# Patient Record
Sex: Female | Born: 1955 | Race: Black or African American | Hispanic: No | Smoking: Former smoker
Health system: Southern US, Community
[De-identification: ages and names within clinical notes are randomized; demographics above are authoritative.]

## PROBLEM LIST (undated history)

## (undated) DIAGNOSIS — M539 Dorsopathy, unspecified: Secondary | ICD-10-CM

## (undated) DIAGNOSIS — I10 Essential (primary) hypertension: Secondary | ICD-10-CM

## (undated) DIAGNOSIS — M1990 Unspecified osteoarthritis, unspecified site: Secondary | ICD-10-CM

## (undated) DIAGNOSIS — Z8719 Personal history of other diseases of the digestive system: Secondary | ICD-10-CM

## (undated) DIAGNOSIS — S62109A Fracture of unspecified carpal bone, unspecified wrist, initial encounter for closed fracture: Secondary | ICD-10-CM

## (undated) DIAGNOSIS — M25569 Pain in unspecified knee: Secondary | ICD-10-CM

## (undated) DIAGNOSIS — M549 Dorsalgia, unspecified: Secondary | ICD-10-CM

## (undated) DIAGNOSIS — I499 Cardiac arrhythmia, unspecified: Secondary | ICD-10-CM

## (undated) DIAGNOSIS — G8929 Other chronic pain: Secondary | ICD-10-CM

## (undated) DIAGNOSIS — Z8659 Personal history of other mental and behavioral disorders: Secondary | ICD-10-CM

## (undated) DIAGNOSIS — K219 Gastro-esophageal reflux disease without esophagitis: Secondary | ICD-10-CM

## (undated) DIAGNOSIS — G4730 Sleep apnea, unspecified: Secondary | ICD-10-CM

## (undated) DIAGNOSIS — N760 Acute vaginitis: Secondary | ICD-10-CM

## (undated) DIAGNOSIS — E119 Type 2 diabetes mellitus without complications: Secondary | ICD-10-CM

## (undated) DIAGNOSIS — R7303 Prediabetes: Secondary | ICD-10-CM

## (undated) DIAGNOSIS — K5792 Diverticulitis of intestine, part unspecified, without perforation or abscess without bleeding: Secondary | ICD-10-CM

## (undated) DIAGNOSIS — R112 Nausea with vomiting, unspecified: Secondary | ICD-10-CM

## (undated) DIAGNOSIS — G629 Polyneuropathy, unspecified: Secondary | ICD-10-CM

## (undated) DIAGNOSIS — G4733 Obstructive sleep apnea (adult) (pediatric): Secondary | ICD-10-CM

## (undated) DIAGNOSIS — R001 Bradycardia, unspecified: Secondary | ICD-10-CM

## (undated) DIAGNOSIS — M545 Low back pain, unspecified: Secondary | ICD-10-CM

## (undated) DIAGNOSIS — Z9889 Other specified postprocedural states: Secondary | ICD-10-CM

## (undated) DIAGNOSIS — Z862 Personal history of diseases of the blood and blood-forming organs and certain disorders involving the immune mechanism: Secondary | ICD-10-CM

## (undated) HISTORY — PX: OTHER SURGICAL HISTORY: SHX169

## (undated) HISTORY — DX: Polyneuropathy, unspecified: G62.9

## (undated) HISTORY — DX: Unspecified osteoarthritis, unspecified site: M19.90

## (undated) HISTORY — DX: Personal history of diseases of the blood and blood-forming organs and certain disorders involving the immune mechanism: Z86.2

## (undated) HISTORY — DX: Personal history of other mental and behavioral disorders: Z86.59

## (undated) HISTORY — PX: ABDOMINAL HYSTERECTOMY: SHX81

## (undated) HISTORY — DX: Acute vaginitis: N76.0

## (undated) HISTORY — DX: Dorsopathy, unspecified: M53.9

## (undated) HISTORY — DX: Fracture of unspecified carpal bone, unspecified wrist, initial encounter for closed fracture: S62.109A

## (undated) HISTORY — DX: Obstructive sleep apnea (adult) (pediatric): G47.33

## (undated) HISTORY — DX: Bradycardia, unspecified: R00.1

## (undated) HISTORY — PX: BREAST REDUCTION SURGERY: SHX8

## (undated) HISTORY — DX: Personal history of other diseases of the digestive system: Z87.19

## (undated) HISTORY — DX: Type 2 diabetes mellitus without complications: E11.9

## (undated) HISTORY — PX: COLONOSCOPY: SHX174

## (undated) HISTORY — PX: TUBAL LIGATION: SHX77

## (undated) HISTORY — PX: UPPER GASTROINTESTINAL ENDOSCOPY: SHX188

## (undated) HISTORY — PX: KNEE SURGERY: SHX244

---

## 1999-03-22 ENCOUNTER — Other Ambulatory Visit: Admission: RE | Admit: 1999-03-22 | Discharge: 1999-03-22 | Payer: Self-pay | Admitting: Nurse Practitioner

## 2000-10-29 ENCOUNTER — Ambulatory Visit (HOSPITAL_BASED_OUTPATIENT_CLINIC_OR_DEPARTMENT_OTHER): Admission: RE | Admit: 2000-10-29 | Discharge: 2000-10-29 | Payer: Self-pay | Admitting: Critical Care Medicine

## 2000-12-04 ENCOUNTER — Other Ambulatory Visit: Admission: RE | Admit: 2000-12-04 | Discharge: 2000-12-04 | Payer: Self-pay | Admitting: *Deleted

## 2001-04-17 ENCOUNTER — Ambulatory Visit (HOSPITAL_COMMUNITY): Admission: RE | Admit: 2001-04-17 | Discharge: 2001-04-17 | Payer: Self-pay | Admitting: Orthopedic Surgery

## 2001-12-04 ENCOUNTER — Encounter: Payer: Self-pay | Admitting: Emergency Medicine

## 2001-12-04 ENCOUNTER — Inpatient Hospital Stay (HOSPITAL_COMMUNITY): Admission: EM | Admit: 2001-12-04 | Discharge: 2001-12-05 | Payer: Self-pay | Admitting: Emergency Medicine

## 2001-12-05 ENCOUNTER — Encounter: Payer: Self-pay | Admitting: Internal Medicine

## 2001-12-09 ENCOUNTER — Other Ambulatory Visit: Admission: RE | Admit: 2001-12-09 | Discharge: 2001-12-09 | Payer: Self-pay | Admitting: Obstetrics and Gynecology

## 2003-02-11 ENCOUNTER — Other Ambulatory Visit: Admission: RE | Admit: 2003-02-11 | Discharge: 2003-02-11 | Payer: Self-pay | Admitting: Obstetrics and Gynecology

## 2003-04-23 ENCOUNTER — Encounter: Payer: Self-pay | Admitting: Emergency Medicine

## 2003-04-23 ENCOUNTER — Emergency Department (HOSPITAL_COMMUNITY): Admission: EM | Admit: 2003-04-23 | Discharge: 2003-04-23 | Payer: Self-pay | Admitting: Emergency Medicine

## 2004-02-14 ENCOUNTER — Other Ambulatory Visit: Admission: RE | Admit: 2004-02-14 | Discharge: 2004-02-14 | Payer: Self-pay | Admitting: Obstetrics and Gynecology

## 2004-02-17 ENCOUNTER — Ambulatory Visit (HOSPITAL_COMMUNITY): Admission: RE | Admit: 2004-02-17 | Discharge: 2004-02-17 | Payer: Self-pay | Admitting: Orthopedic Surgery

## 2004-02-17 ENCOUNTER — Ambulatory Visit (HOSPITAL_BASED_OUTPATIENT_CLINIC_OR_DEPARTMENT_OTHER): Admission: RE | Admit: 2004-02-17 | Discharge: 2004-02-17 | Payer: Self-pay | Admitting: Orthopedic Surgery

## 2004-03-10 ENCOUNTER — Emergency Department (HOSPITAL_COMMUNITY): Admission: EM | Admit: 2004-03-10 | Discharge: 2004-03-10 | Payer: Self-pay | Admitting: Emergency Medicine

## 2004-11-06 ENCOUNTER — Ambulatory Visit: Payer: Self-pay | Admitting: Internal Medicine

## 2004-11-06 ENCOUNTER — Ambulatory Visit (HOSPITAL_COMMUNITY): Admission: RE | Admit: 2004-11-06 | Discharge: 2004-11-06 | Payer: Self-pay | Admitting: Internal Medicine

## 2004-11-09 ENCOUNTER — Ambulatory Visit (HOSPITAL_COMMUNITY): Admission: RE | Admit: 2004-11-09 | Discharge: 2004-11-09 | Payer: Self-pay | Admitting: Internal Medicine

## 2004-11-29 ENCOUNTER — Encounter: Admission: RE | Admit: 2004-11-29 | Discharge: 2005-01-04 | Payer: Self-pay | Admitting: Internal Medicine

## 2005-01-01 ENCOUNTER — Ambulatory Visit: Payer: Self-pay | Admitting: Internal Medicine

## 2005-01-02 ENCOUNTER — Ambulatory Visit: Payer: Self-pay | Admitting: Internal Medicine

## 2005-04-17 ENCOUNTER — Ambulatory Visit: Payer: Self-pay | Admitting: Internal Medicine

## 2005-07-17 ENCOUNTER — Ambulatory Visit: Payer: Self-pay | Admitting: Internal Medicine

## 2008-02-19 ENCOUNTER — Encounter: Payer: Self-pay | Admitting: Internal Medicine

## 2008-02-19 DIAGNOSIS — M545 Low back pain, unspecified: Secondary | ICD-10-CM | POA: Insufficient documentation

## 2008-02-19 DIAGNOSIS — I10 Essential (primary) hypertension: Secondary | ICD-10-CM | POA: Insufficient documentation

## 2008-02-19 DIAGNOSIS — M129 Arthropathy, unspecified: Secondary | ICD-10-CM | POA: Insufficient documentation

## 2008-11-24 ENCOUNTER — Emergency Department (HOSPITAL_COMMUNITY): Admission: EM | Admit: 2008-11-24 | Discharge: 2008-11-24 | Payer: Self-pay | Admitting: Family Medicine

## 2011-05-11 NOTE — Op Note (Signed)
[facility]  Patient:    Kristin Gibson, Kristin Gibson                 MRN: [id number] Proc. Date: 04/17/01 Adm. Date:  [id number] Attending:  Marlowe Kays Page                           Operative Report  PREOPERATIVE DIAGNOSIS:  Torn medial meniscus, right knee.  POSTOPERATIVE DIAGNOSES:  Torn medial and lateral menisci, right knee.  OPERATION PERFORMED:  Right knee arthroscopy with partial medial and lateral meniscectomies.  SURGEON:  Dr. Simonne Come.  ASSISTANT:  Nurse.  ANESTHESIA:  General.  PATHOLOGY AND JUSTIFICATION FOR PROCEDURE:  She first noted pain in her right knee around last August that has been progressive in nature and mainly medial. On exam, she has had medial joint line tenderness with pain increased by McMurray maneuver and with symptoms of internal derangement it was not felt an MRI was needed. At surgery, she had a significant tear of the entire posterior third of the medial meniscus as well as a tear of the posterior curve of the lateral meniscus and also anteriorly. The joint surfaces had minimal wear.  DESCRIPTION OF PROCEDURE:  Satisfactory general anesthesia, pneumatic tourniquet, thigh stabilizer, the right knee was prepped with Duraprep, draped in a sterile field, superior and medial saline inflow through it. First through an anterior lateral portal, the medial compartment of the knee joint was evaluated and there was minimal synovitis present. The extensive tear of the posterior meniscus was noted and trimmed back to a stable rim with baskets and then shaved down until smooth with a 3.5 shaver. I took final pictures after probing to be sure that there were no loose portions of meniscus. I then looked up in the medial gutter and suprapatellar area and no abnormalities were noted. There was minimal patella wear. Reversing portals, the pathology of the lateral meniscus was noted and I trimmed back the tear in the  posterior curve with baskets and then shaved down the inner rim anteriorly with a 3.5 shaver. She also had a little bit of wear of the lateral tibial plateau which I smoothed down as well. I then looked up in the lateral gutter and suprapatellar area and no additional findings were noted requiring surgical correction. The knee joint was then irrigated until clear and all fluid possible removed. The two anterior portals were closed with 4-0 nylon and 20 cc of of 0.5% Marcaine with adrenaline and 4 mg of morphine were then instilled through the inflap rasp which was removed and this portal closed with 4-0 nylon as well. Betadine Adaptic dry sterile dressing were applied, the tourniquet was released. The patient tolerated the procedure well and was taken to the recovery room in satisfactory condition with no known complications.DD:  04/17/01 TD:  04/18/01 Job: [id number]

## 2011-05-11 NOTE — Op Note (Signed)
NAME:  Kristin Gibson, Kristin Gibson                    ACCOUNT NO.:  [id number]   MEDICAL RECORD NO.:  [id number]                   PATIENT TYPE:  AMB   LOCATION:  DSC                                  FACILITY:  [facility]   PHYSICIAN:  Marlowe Kays, M.D.               DATE OF BIRTH:  1956/03/02   DATE OF PROCEDURE:  02/17/2004  DATE OF DISCHARGE:                                 OPERATIVE REPORT   PREOPERATIVE DIAGNOSIS:  Torn lateral, possible torn medial meniscus, right  knee.   POSTOPERATIVE DIAGNOSIS:  Torn lateral and medial menisci, right knee.   OPERATION PERFORMED:  Right knee arthroscopy with partial medial and lateral  meniscectomy.   SURGEON:  Marlowe Kays, M.D.   ASSISTANT:  Nurse.   ANESTHESIA:  General.   INDICATIONS FOR PROCEDURE:  The patient has a history of a prior right knee  arthroscopy and partial medial meniscectomy with history of having had a  partial medial meniscectomy with an arthroscopy many years ago.  She has had  a recurrence of her pain and I obtained an MRI of her right knee dated  December 23, 2003 which showed a severe degenerated lateral meniscus and  possible recurrent tears of her medial meniscus.  Accordingly, she is here  today for the above mentioned surgery.  At surgery, the MRI was accurate in  that she did have a severely macerated lateral meniscus at its mid and  posterior curve areas.  Also had tears of the medial meniscus intercondylar  and posterior curve areas as well.  Joint surfaces all looked good, did not  require shaving.  Patella surface looked good.  ACL was intact.   DESCRIPTION OF PROCEDURE:  Satisfactory general anesthesia MAC tourniquet.  Leg was esmarched out nonsterilely.  Thigh stabilizer.  Right leg prepped  with DuraPrep, draped as sterile field.  Superomedial saline inflow.  Posterior anterior medial portal lateral compartment knee joint was  evaluated.  After finding the torn lateral meniscus, I resected back to  stable rim with baskets and shaved it down until smooth with a 3.5 shaver  until remaining meniscus was stable and smoothly contoured.  Was unable to  get up into the suprapatellar area through this approach.  I then exchanged  portals and through the anterolateral portal looked at medial compartment of  knee joint with findings of torn meniscus as noted above.  The meniscus was  trimmed back once again with small baskets and shaved down until smooth with  a 3.5 shaver.  I then looked up in the medial gutter and patellar surface  had some minimal wear which did not require shaving.  The knee joint was  then irrigated until clear and all fluid possible removed.  The __________  portals closed with 4-0 nylon.  Then, 20 mL 0.5% Marcaine with adrenalin 4  mg of morphine were then instilled through the inflow apparatus which was  removed __________  as well.  Betadine,  Adaptic, dry sterile dressing  applied.  Tourniquet was released.  Tolerated the procedure well and was  taken to recovery in satisfactory condition with no complications.                                               Marlowe Kays, M.D.    JA/MEDQ  D:  02/17/2004  T:  02/18/2004  Job:  [id number]

## 2011-05-11 NOTE — Discharge Summary (Signed)
[place]  Patient:    Kristin Gibson, Kristin Gibson Visit Number: [id number] MRN: [id number]          Service Type: MED Location: [id number] Attending Physician:  Carrie Mew Dictated by:   Rosalyn Gess. Norins, M.D. [facility] Admit Date:  12/04/2001 Disc. Date: 12/05/01   CC:         Sonda Primes, M.D. [facility]   Discharge Summary  ADMITTING DIAGNOSIS:  Chest pain, rule out myocardial infarction.  DISCHARGE DIAGNOSIS:  Myocardial infarction, ruled out.  CONSULTATIONS:  None.  PROCEDURE:  Stress Cardiolite which during exercise did show the patient to have a significant hypertensive response with a blood pressure maxing out at 220/116 with a diastolic increase in recovery.  During exercise, the patient reported chest discomfort at one minute with central chest pressure and radiation to her left neck.  She had no diaphoresis and no nausea.  The pain was rated 5/10 and continued into recovery with pain in her left shoulder and thoracic area increasing to an 8/10.  It was relieved with two sublingual nitroglycerin.  Electrocardiogram, marred by artifact, with question of ST downsloping V5-V6 at peak exercise resolved in recovery.  Images were read out by radiology as negative for any evidence of ischemia.  The ejection fraction was 64%.  HISTORY OF PRESENT ILLNESS:  The patient is a 55 year old, African-American female with no known major risk factors who has presented to the ER with left sternal chest pain that started at work and rated at an 8/10 with some radiation to her jaw.  The patient reported she did have some shortness of breath without diaphoresis or nausea.  Because of her symptoms, the patient was admitted to rule out for MI.  PAST MEDICAL HISTORY: 1. Tubal ligation. 2. Total abdominal hysterectomy. 3. Arthroscopic surgery of her right knee. 4. Breast reduction surgery. 5. No significant medical illnesses.  MEDICATION:  Maxzide  for "swelling."  ALLERGIES:  No known drug allergies.  PHYSICAL EXAMINATION: (Per Dr. Lovell Sheehan)  VITAL SIGNS:  Blood pressure 132/82, pulse 76.  CHEST:  Clear.  HEART:  Regular rate and rhythm without murmurs, rubs or gallops.  Trace peripheral edema.  She had chest wall tenderness at the costochondral junction of 7, 8 and 9 bilaterally.  ASSESSMENT/PLAN:  The patient was admitted to the telemetry unit where she remained in sinus rhythm with no abnormalities.  LABORATORY DATA AND X-RAY FINDINGS:  The patient had initial CK of 334 with MB fraction of 1.6.  Second CK 292 with CK-MB of 1.1, troponin less than 0.01 on both occasions.  HOSPITAL COURSE:  The patient was given Protonix with some significant improvement in her discomfort.  The patient having had negative cardiac enzymes with negative troponins with positive CK, but negative MB fractions suggestive of a musculoskeletal elevation, not cardiac elevation and with a negative Cardiolite, the patient is felt to be stable for discharge.  With the patient having had a hypertensive response to exercise, despite having a normal resting blood pressure, would recommend that she be started on antihypertensive medication.  Will defer this decision to Dr. Posey Rea who will see the patient in followup.  DISCHARGE PHYSICAL EXAMINATION:  VITAL SIGNS:  Temperature 98.3, blood pressure 114/80, pulse 62, O2 saturations on room air 98%.  GENERAL:  This is an obese, African-American female in no acute distress.  CHEST:  Clear.  CARDIOVASCULAR:  Regular rate and rhythm without murmurs, rubs or gallops.  ABDOMEN:  Obese, soft, with  positive bowel sounds with no significant tenderness to deep palpation.  DISCHARGE LABORATORY DATA AND X-RAY FINDINGS:  TSH of 1.992.  Hemoglobin A1C was 5.6.  Chest x-ray revealed no active disease.  DISPOSITION:  The patient is discharged home.  FOLLOWUP:  She is instructed to call and make an  appointment to see Dr. Posey Rea in followup in 7-10 days.  He will discuss with her appropriate antihypertensive medication for her exercise related hypertension.  SPECIAL INSTRUCTIONS:  The patient is instructed that if she has recurrent chest pain or chest discomfort that is unrelieved, that she should call for further evaluation.  DISCHARGE MEDICATIONS: 1. Protonix 40 mg q.d. 2. Celebrex 200 mg q.d. for probably costochondritis.  CONDITION ON DISCHARGE:  Stable and improved. Dictated by:   Rosalyn Gess. Norins, M.D. [facility] Attending Physician:  Carrie Mew DD:  12/05/01 TD:  12/06/01 Job: [id number]

## 2011-08-21 ENCOUNTER — Inpatient Hospital Stay (INDEPENDENT_AMBULATORY_CARE_PROVIDER_SITE_OTHER)
Admission: RE | Admit: 2011-08-21 | Discharge: 2011-08-21 | Disposition: A | Discharge status: Home / Self Care | Payer: Self-pay | Source: Ambulatory Visit | Attending: Emergency Medicine | Admitting: Emergency Medicine

## 2011-08-21 DIAGNOSIS — I10 Essential (primary) hypertension: Secondary | ICD-10-CM

## 2011-08-21 LAB — POCT URINALYSIS DIP (DEVICE)
Glucose, UA: NEGATIVE mg/dL
Ketones, ur: NEGATIVE mg/dL
Leukocytes, UA: NEGATIVE
Protein, ur: NEGATIVE mg/dL
Specific Gravity, Urine: 1.01 (ref 1.005–1.030)
Urobilinogen, UA: 0.2 mg/dL (ref 0.0–1.0)

## 2011-08-22 LAB — URINE CULTURE

## 2011-09-27 LAB — POCT URINALYSIS DIP (DEVICE)
Bilirubin Urine: NEGATIVE
Hgb urine dipstick: NEGATIVE
Ketones, ur: NEGATIVE mg/dL
Protein, ur: 30 mg/dL — AB
pH: 6.5 (ref 5.0–8.0)

## 2011-09-27 LAB — WET PREP, GENITAL
Trich, Wet Prep: NONE SEEN
WBC, Wet Prep HPF POC: NONE SEEN
Yeast Wet Prep HPF POC: NONE SEEN

## 2012-05-21 ENCOUNTER — Encounter (HOSPITAL_COMMUNITY): Payer: Self-pay

## 2012-05-21 ENCOUNTER — Emergency Department (HOSPITAL_COMMUNITY)
Admission: EM | Admit: 2012-05-21 | Discharge: 2012-05-21 | Disposition: A | Discharge status: Home / Self Care | Payer: Self-pay | Attending: Emergency Medicine | Admitting: Emergency Medicine

## 2012-05-21 DIAGNOSIS — Z79899 Other long term (current) drug therapy: Secondary | ICD-10-CM | POA: Insufficient documentation

## 2012-05-21 DIAGNOSIS — R002 Palpitations: Secondary | ICD-10-CM | POA: Insufficient documentation

## 2012-05-21 DIAGNOSIS — I10 Essential (primary) hypertension: Secondary | ICD-10-CM | POA: Insufficient documentation

## 2012-05-21 DIAGNOSIS — R51 Headache: Secondary | ICD-10-CM | POA: Insufficient documentation

## 2012-05-21 HISTORY — DX: Essential (primary) hypertension: I10

## 2012-05-21 LAB — DIFFERENTIAL
Basophils Absolute: 0 10*3/uL (ref 0.0–0.1)
Eosinophils Absolute: 0.1 10*3/uL (ref 0.0–0.7)
Eosinophils Relative: 1 % (ref 0–5)
Lymphocytes Relative: 38 % (ref 12–46)
Neutrophils Relative %: 54 % (ref 43–77)

## 2012-05-21 LAB — CBC
MCV: 89.6 fL (ref 78.0–100.0)
Platelets: 222 10*3/uL (ref 150–400)
RDW: 14.5 % (ref 11.5–15.5)
WBC: 5.1 10*3/uL (ref 4.0–10.5)

## 2012-05-21 LAB — URINALYSIS, ROUTINE W REFLEX MICROSCOPIC
Ketones, ur: NEGATIVE mg/dL
Leukocytes, UA: NEGATIVE
Nitrite: NEGATIVE
Protein, ur: NEGATIVE mg/dL
Urobilinogen, UA: 0.2 mg/dL (ref 0.0–1.0)

## 2012-05-21 LAB — BASIC METABOLIC PANEL
CO2: 30 mEq/L (ref 19–32)
Calcium: 9.5 mg/dL (ref 8.4–10.5)
Creatinine, Ser: 0.79 mg/dL (ref 0.50–1.10)
GFR calc Af Amer: >90 mL/min (ref >90)
GFR calc non Af Amer: >90 mL/min (ref >90)

## 2012-05-21 LAB — TROPONIN I: Troponin I: <0.3 ng/mL (ref <0.30)

## 2012-05-21 MED ORDER — ASPIRIN 81 MG PO CHEW
324.0000 mg | CHEWABLE_TABLET | Freq: Once | ORAL | Status: AC
  Administered 2012-05-21: 324 mg via ORAL
  Filled 2012-05-21: qty 3

## 2012-05-21 MED ORDER — ACETAMINOPHEN 325 MG PO TABS
650.0000 mg | ORAL_TABLET | Freq: Once | ORAL | Status: AC
  Administered 2012-05-21: 650 mg via ORAL
  Filled 2012-05-21: qty 2

## 2012-05-21 NOTE — ED Notes (Addendum)
Urine not resulted. Lab called. Lab informs that urine has not been received. Urine was collected and sent at 1121. Pascal Lux Wingen PA notified and pt informed of need to recollect urine.

## 2012-05-21 NOTE — ED Notes (Signed)
Patient reports that she has a history of hypertension and patient has been doubling her HTN meds without physician knowing. Today, patient presents with headache, hypertension and c/o lower abd pain. Patient also c/o heart racing x 3 weeks.

## 2012-05-21 NOTE — ED Provider Notes (Signed)
History     CSN: [id number]  Arrival date & time 05/21/12  1002   First MD Initiated Contact with Patient 05/21/12 1042      Chief Complaint  Patient presents with  . Headache  . Abdominal Pain    (Consider location/radiation/quality/duration/timing/severity/associated sxs/prior treatment) HPI Comments: Patient comes in today with a chief complaint of headache and feelings that her heart is racing.  She reports that the headache is similar to headaches that she has had in the past when her blood pressure becomes elevated. She reports that over the past 3 weeks her blood pressure has been elevated and she has been doubling up on her blood pressure medication.  She reports that at home her blood pressure has been running 190's/110.  She last took her blood pressure medication this morning.  She denies any visual changes, focal weakness, difficulty speaking, difficulty swallowing, vomiting, or dizziness.  She has not taken anything for her headache.  She also reports that she is having some numbness over the lower abdomen.  She denies abdominal pain.  No nausea, vomiting, or diarrhea.  She has not had any fevers.  She also reports that she has been feeling palpitations for the past 3 weeks.  The history is provided by the patient.    Past Medical History  Diagnosis Date  . Hypertension     Past Surgical History  Procedure Date  . Tubal ligation   . Abdominal hysterectomy   . Knee surgery   . Breast reduction surgery     Family History  Problem Relation Age of Onset  . Hypertension Mother   . Hypertension Sister     History  Substance Use Topics  . Smoking status: Former Games developer  . Smokeless tobacco: Never Used  . Alcohol Use: Yes     occasionally    OB History    Grav Para Term Preterm Abortions TAB SAB Ect Mult Living                  Review of Systems  Constitutional: Negative for fever, chills and diaphoresis.  HENT: Negative for neck pain and neck stiffness.     Eyes: Negative for pain, redness and visual disturbance.  Respiratory: Negative for shortness of breath.   Cardiovascular: Positive for palpitations. Negative for leg swelling.  Gastrointestinal: Negative for nausea, vomiting, abdominal pain, diarrhea, constipation and abdominal distention.  Genitourinary: Negative for dysuria.  Musculoskeletal: Negative for gait problem.  Skin: Negative for rash.  Neurological: Positive for headaches. Negative for dizziness, syncope, weakness and light-headedness.  Psychiatric/Behavioral: Negative for confusion.    Allergies  Review of patient's allergies indicates no known allergies.  Home Medications   Current Outpatient Rx  Name Route Sig Dispense Refill  . ACETAMINOPHEN 500 MG PO TABS Oral Take 500 mg by mouth every 6 (six) hours as needed. pain    . BENAZEPRIL HCL 40 MG PO TABS Oral Take 80 mg by mouth daily.    Marland Kitchen BIOTIN 1000 MCG PO TABS Oral Take 1,000 mcg by mouth daily.    . CHLORPHENIRAMINE-DM 4-30 MG PO TABS Oral Take 1 tablet by mouth daily.    Marland Kitchen VITAMIN D 1000 UNITS PO TABS Oral Take 1,000 Units by mouth daily.    Marland Kitchen ULTIMATE PROBIOTIC FORMULA PO CAPS Oral Take 1 capsule by mouth daily.    Marland Kitchen MAGNESIUM OXIDE 400 MG PO TABS Oral Take 400 mg by mouth daily.    . MULTI-VITAMIN/MINERALS PO TABS Oral Take 1 tablet  by mouth daily.    Marland Kitchen FISH OIL TRIPLE STRENGTH 1400 MG PO CAPS Oral Take 1,400 mg by mouth daily.    Marland Kitchen POTASSIUM GLUCONATE PO Oral Take 99 mg by mouth daily.    . TRAMADOL-ACETAMINOPHEN 37.5-325 MG PO TABS Oral Take 1 tablet by mouth every 6 (six) hours as needed. pain    . TRIAMTERENE-HCTZ 37.5-25 MG PO TABS Oral Take 1 tablet by mouth daily.      BP 177/102  Pulse 62  Temp(Src) 99.1 F (37.3 C) (Oral)  Wt 266 lb 5.1 oz (120.8 kg)  SpO2 100%  Physical Exam  Nursing note and vitals reviewed. Constitutional: She appears well-developed and well-nourished.  HENT:  Head: Normocephalic and atraumatic.  Mouth/Throat: Oropharynx  is clear and moist.  Eyes: EOM are normal. Pupils are equal, round, and reactive to light.  Neck: Normal range of motion. Neck supple.  Cardiovascular: Normal rate, regular rhythm and normal heart sounds.   Pulmonary/Chest: Effort normal and breath sounds normal.  Abdominal: Soft. Bowel sounds are normal. She exhibits no distension and no mass. There is no tenderness. There is no rebound and no guarding.  Neurological: She is alert. She has normal strength. No cranial nerve deficit or sensory deficit. Coordination and gait normal.  Skin: Skin is warm and dry.  Psychiatric: She has a normal mood and affect.    ED Course  Procedures (including critical care time)   Labs Reviewed  URINALYSIS, ROUTINE W REFLEX MICROSCOPIC  BASIC METABOLIC PANEL  CBC  DIFFERENTIAL  TROPONIN I   No results found.   No diagnosis found.  Patient discussed with Dr. Ranae Palms.  11:58 AM Reassessed patient.  She reports that her headache has improved at this time.   Date: 05/21/2012  Rate: 80  Rhythm: normal sinus rhythm  QRS Axis: normal  Intervals: normal  ST/T Wave abnormalities: nonspecific T wave changes  Conduction Disutrbances:none  Narrative Interpretation: PVC's   Old EKG Reviewed: changes noted, PVC's present on EKG today that were not noted on last EKG  2:26 PM Reassessed patient.  She reports that her headache has completely resolved at this time.  MDM  Patient presenting with headache, which she reports is typical when her blood pressure increases.  BP fluctuating while in ED.  No chest pain.  No decreased urination.  Labs unremarkable.  UA negative.  No focal neurological deficits, afebrile, no vision changes, no vomiting.  Headache resolved completely while in ED.  Therefore, feel that patient could be discharged home.  Patient explained the importance of following up with PCP regarding elevated blood pressure.          Pascal Lux [place], PA-C 05/21/12 2317

## 2012-05-21 NOTE — Discharge Instructions (Signed)
Take your blood pressure medication as prescribed.  Return to the Emergency Department if symptoms change or worsen.

## 2012-05-23 NOTE — ED Provider Notes (Signed)
Medical screening examination/treatment/procedure(s) were performed by non-physician practitioner and as supervising physician I was immediately available for consultation/collaboration.   Miloh Alcocer, MD 05/23/12 1949 

## 2016-11-05 DIAGNOSIS — R600 Localized edema: Secondary | ICD-10-CM | POA: Insufficient documentation

## 2016-11-05 DIAGNOSIS — G8929 Other chronic pain: Secondary | ICD-10-CM | POA: Insufficient documentation

## 2016-11-05 DIAGNOSIS — M25561 Pain in right knee: Secondary | ICD-10-CM

## 2016-11-05 DIAGNOSIS — M25562 Pain in left knee: Secondary | ICD-10-CM

## 2016-11-20 DIAGNOSIS — M180 Bilateral primary osteoarthritis of first carpometacarpal joints: Secondary | ICD-10-CM | POA: Insufficient documentation

## 2016-11-20 DIAGNOSIS — M1712 Unilateral primary osteoarthritis, left knee: Secondary | ICD-10-CM | POA: Insufficient documentation

## 2016-11-20 DIAGNOSIS — M1711 Unilateral primary osteoarthritis, right knee: Secondary | ICD-10-CM | POA: Insufficient documentation

## 2016-12-04 DIAGNOSIS — G4733 Obstructive sleep apnea (adult) (pediatric): Secondary | ICD-10-CM | POA: Insufficient documentation

## 2017-01-15 DIAGNOSIS — Z6841 Body Mass Index (BMI) 40.0 and over, adult: Secondary | ICD-10-CM

## 2017-01-15 DIAGNOSIS — N3281 Overactive bladder: Secondary | ICD-10-CM | POA: Insufficient documentation

## 2017-01-15 DIAGNOSIS — K219 Gastro-esophageal reflux disease without esophagitis: Secondary | ICD-10-CM | POA: Insufficient documentation

## 2017-02-24 ENCOUNTER — Encounter (HOSPITAL_COMMUNITY): Payer: Self-pay

## 2017-02-24 ENCOUNTER — Emergency Department (HOSPITAL_COMMUNITY)
Admission: EM | Admit: 2017-02-24 | Discharge: 2017-02-24 | Disposition: A | Discharge status: Home / Self Care | Payer: Medicare Other | Attending: Emergency Medicine | Admitting: Emergency Medicine

## 2017-02-24 ENCOUNTER — Emergency Department (HOSPITAL_COMMUNITY): Payer: Medicare Other

## 2017-02-24 DIAGNOSIS — Z79899 Other long term (current) drug therapy: Secondary | ICD-10-CM | POA: Diagnosis not present

## 2017-02-24 DIAGNOSIS — B9789 Other viral agents as the cause of diseases classified elsewhere: Secondary | ICD-10-CM

## 2017-02-24 DIAGNOSIS — Z87891 Personal history of nicotine dependence: Secondary | ICD-10-CM | POA: Insufficient documentation

## 2017-02-24 DIAGNOSIS — R05 Cough: Secondary | ICD-10-CM | POA: Diagnosis present

## 2017-02-24 DIAGNOSIS — J069 Acute upper respiratory infection, unspecified: Secondary | ICD-10-CM | POA: Diagnosis not present

## 2017-02-24 DIAGNOSIS — I10 Essential (primary) hypertension: Secondary | ICD-10-CM | POA: Insufficient documentation

## 2017-02-24 HISTORY — DX: Diverticulitis of intestine, part unspecified, without perforation or abscess without bleeding: K57.92

## 2017-02-24 MED ORDER — KETOROLAC TROMETHAMINE 30 MG/ML IJ SOLN
30.0000 mg | Freq: Once | INTRAMUSCULAR | Status: AC
  Administered 2017-02-24: 30 mg via INTRAVENOUS
  Filled 2017-02-24: qty 1

## 2017-02-24 MED ORDER — GUAIFENESIN ER 1200 MG PO TB12: 1.0000 | ORAL_TABLET | Freq: Two times a day (BID) | ORAL | 0 refills | Status: DC

## 2017-02-24 MED ORDER — ALBUTEROL SULFATE HFA 108 (90 BASE) MCG/ACT IN AERS
2.0000 | INHALATION_SPRAY | RESPIRATORY_TRACT | Status: DC | PRN
  Administered 2017-02-24: 2 via RESPIRATORY_TRACT
  Filled 2017-02-24: qty 6.7

## 2017-02-24 MED ORDER — PREDNISONE 50 MG PO TABS: 50.0000 mg | ORAL_TABLET | Freq: Every day | ORAL | 0 refills | Status: DC

## 2017-02-24 MED ORDER — AEROCHAMBER PLUS FLO-VU MEDIUM MISC
1.0000 | Freq: Once | Status: AC
  Administered 2017-02-24: 1
  Filled 2017-02-24: qty 1

## 2017-02-24 MED ORDER — IPRATROPIUM-ALBUTEROL 0.5-2.5 (3) MG/3ML IN SOLN
3.0000 mL | Freq: Once | RESPIRATORY_TRACT | Status: AC
  Administered 2017-02-24: 3 mL via RESPIRATORY_TRACT
  Filled 2017-02-24: qty 3

## 2017-02-24 MED ORDER — SODIUM CHLORIDE 0.9 % IV BOLUS (SEPSIS)
1000.0000 mL | Freq: Once | INTRAVENOUS | Status: AC
  Administered 2017-02-24: 1000 mL via INTRAVENOUS

## 2017-02-24 MED ORDER — PROMETHAZINE-DM 6.25-15 MG/5ML PO SYRP: 5.0000 mL | ORAL_SOLUTION | Freq: Four times a day (QID) | ORAL | 0 refills | Status: DC | PRN

## 2017-02-24 NOTE — ED Provider Notes (Signed)
[facility]-EMERGENCY DEPT Provider Note   CSN: [id number] Arrival date & time: 02/24/17  1025     History   Chief Complaint Chief Complaint  Patient presents with  . Cough  . Headache    HPI Kristin Gibson is a 61 y.o. female.  HPI Patient presents to the emergency department with a 2 day history of cough, nasal congestion, sore throat.  Patient states that she woke up this morning with worsening cough.  Patient states that her cough is dry.  She is like she has been wheezing.  Patient did not take any medications prior to arrival for her symptoms. The patient denies chest pain, shortness of breath, headache,blurred vision, neck pain, fever, weakness, numbness, dizziness, anorexia, edema, abdominal pain, nausea, vomiting, diarrhea, rash, back pain, dysuria, hematemesis, bloody stool, near syncope, or syncope. Past Medical History:  Diagnosis Date  . Diverticulitis   . Hypertension     Patient Active Problem List   Diagnosis Date Noted  . HYPERTENSION 02/19/2008  . ARTHRITIS 02/19/2008  . LOW BACK PAIN 02/19/2008    Past Surgical History:  Procedure Laterality Date  . ABDOMINAL HYSTERECTOMY    . BREAST REDUCTION SURGERY    . KNEE SURGERY    . TUBAL LIGATION      OB History    No data available       Home Medications    Prior to Admission medications   Medication Sig Start Date End Date Taking? Authorizing Provider  acetaminophen (TYLENOL) 500 MG tablet Take 500 mg by mouth every 6 (six) hours as needed. pain    Historical Provider, MD  benazepril (LOTENSIN) 40 MG tablet Take 80 mg by mouth daily.    Historical Provider, MD  Biotin 1000 MCG tablet Take 1,000 mcg by mouth daily.    Historical Provider, MD  Chlorpheniramine-DM (CORICIDIN COUGH/COLD) 4-30 MG TABS Take 1 tablet by mouth daily.    Historical Provider, MD  cholecalciferol (VITAMIN D) 1000 UNITS tablet Take 1,000 Units by mouth daily.    Historical Provider, MD  Lactobacillus (ULTIMATE PROBIOTIC  FORMULA) CAPS Take 1 capsule by mouth daily.    Historical Provider, MD  magnesium oxide (MAG-OX) 400 MG tablet Take 400 mg by mouth daily.    Historical Provider, MD  Multiple Vitamins-Minerals (MULTIVITAMIN WITH MINERALS) tablet Take 1 tablet by mouth daily.    Historical Provider, MD  Omega-3 Fatty Acids (FISH OIL TRIPLE STRENGTH) 1400 MG CAPS Take 1,400 mg by mouth daily.    Historical Provider, MD  POTASSIUM GLUCONATE PO Take 99 mg by mouth daily.    Historical Provider, MD  traMADol-acetaminophen (ULTRACET) 37.5-325 MG per tablet Take 1 tablet by mouth every 6 (six) hours as needed. pain    Historical Provider, MD  triamterene-hydrochlorothiazide (MAXZIDE-25) 37.5-25 MG per tablet Take 1 tablet by mouth daily.    Historical Provider, MD    Family History Family History  Problem Relation Age of Onset  . Hypertension Mother   . Hypertension Sister     Social History Social History  Substance Use Topics  . Smoking status: Former Games developer  . Smokeless tobacco: Never Used  . Alcohol use Yes     Comment: occasionally     Allergies   Sulfa antibiotics   Review of Systems Review of Systems  All other systems negative except as documented in the HPI. All pertinent positives and negatives as reviewed in the HPI. Physical Exam Updated Vital Signs BP 151/81 (BP Location: Right Arm)   Pulse  66   Temp 98.3 F (36.8 C) (Oral)   Resp 18   SpO2 98%   Physical Exam  Constitutional: She is oriented to person, place, and time. She appears well-developed and well-nourished. No distress.  HENT:  Head: Normocephalic and atraumatic.  Mouth/Throat: Oropharynx is clear and moist.  Eyes: Pupils are equal, round, and reactive to light.  Neck: Normal range of motion. Neck supple.  Cardiovascular: Normal rate, regular rhythm and normal heart sounds.  Exam reveals no gallop and no friction rub.   No murmur heard. Pulmonary/Chest: Effort normal and breath sounds normal. No respiratory  distress. She has no wheezes.  Abdominal: Soft. Bowel sounds are normal. She exhibits no distension. There is no tenderness.  Neurological: She is alert and oriented to person, place, and time. She exhibits normal muscle tone. Coordination normal.  Skin: Skin is warm and dry. Capillary refill takes less than 2 seconds. No rash noted. No erythema.  Psychiatric: She has a normal mood and affect. Her behavior is normal.  Nursing note and vitals reviewed.    ED Treatments / Results  Labs (all labs ordered are listed, but only abnormal results are displayed) Labs Reviewed - No data to display  EKG  EKG Interpretation None       Radiology Dg Chest 2 View  Result Date: 02/24/2017 CLINICAL DATA:  Dry cough for 3 days. EXAM: CHEST  2 VIEW COMPARISON:  None. FINDINGS: Mild cardiomegaly. The hila and mediastinum are normal. No pulmonary nodules, masses, or focal infiltrates. IMPRESSION: No active cardiopulmonary disease. Electronically Signed   By: Gerome Samavid  Williams III M.D   On: 02/24/2017 11:17    Procedures Procedures (including critical care time)  Medications Ordered in ED Medications  sodium chloride 0.9 % bolus 1,000 mL (1,000 mLs Intravenous New Bag/Given 02/24/17 1235)  ipratropium-albuterol (DUONEB) 0.5-2.5 (3) MG/3ML nebulizer solution 3 mL (3 mLs Nebulization Given 02/24/17 1226)  ketorolac (TORADOL) 30 MG/ML injection 30 mg (30 mg Intravenous Given 02/24/17 1235)     Initial Impression / Assessment and Plan / ED Course  I have reviewed the triage vital signs and the nursing notes.  Pertinent labs & imaging results that were available during my care of the patient were reviewed by me and considered in my medical decision making (see chart for details).     Patient be treated for a viral URI with cough.  Told to return here as needed.  Patient agrees the plan and all questions were answered  Final Clinical Impressions(s) / ED Diagnoses   Final diagnoses:  None    New  Prescriptions New Prescriptions   No medications on file     Charlestine NightChristopher Issabela Lesko, PA-C 02/24/17 1357    Lyndal Pulleyaniel Knott, MD 02/24/17 1941

## 2017-02-24 NOTE — ED Triage Notes (Signed)
Pt states she has a cough with rattling in her chest x 2 days.  Pt denies fever.  Also wants bp checked.  States it was 160/130 at home. WNL in triage.  Also has headache which she attributes to bp.  Took her bp meds this morning.

## 2017-02-24 NOTE — Discharge Instructions (Signed)
Return here as needed.  Follow-up with your primary doctor, increase her fluid intake and rest as much as possible °

## 2017-02-25 DIAGNOSIS — R1030 Lower abdominal pain, unspecified: Secondary | ICD-10-CM | POA: Insufficient documentation

## 2017-02-25 DIAGNOSIS — K5900 Constipation, unspecified: Secondary | ICD-10-CM | POA: Insufficient documentation

## 2017-02-25 DIAGNOSIS — K5732 Diverticulitis of large intestine without perforation or abscess without bleeding: Secondary | ICD-10-CM | POA: Insufficient documentation

## 2017-04-19 ENCOUNTER — Emergency Department (HOSPITAL_COMMUNITY)
Admission: EM | Admit: 2017-04-19 | Discharge: 2017-04-20 | Disposition: A | Discharge status: Home / Self Care | Payer: Medicare Other | Attending: Emergency Medicine | Admitting: Emergency Medicine

## 2017-04-19 ENCOUNTER — Emergency Department (HOSPITAL_COMMUNITY): Payer: Medicare Other

## 2017-04-19 ENCOUNTER — Encounter (HOSPITAL_COMMUNITY): Payer: Self-pay | Admitting: Emergency Medicine

## 2017-04-19 DIAGNOSIS — W01198A Fall on same level from slipping, tripping and stumbling with subsequent striking against other object, initial encounter: Secondary | ICD-10-CM | POA: Insufficient documentation

## 2017-04-19 DIAGNOSIS — Y999 Unspecified external cause status: Secondary | ICD-10-CM | POA: Diagnosis not present

## 2017-04-19 DIAGNOSIS — S60221A Contusion of right hand, initial encounter: Secondary | ICD-10-CM

## 2017-04-19 DIAGNOSIS — S6991XA Unspecified injury of right wrist, hand and finger(s), initial encounter: Secondary | ICD-10-CM | POA: Diagnosis present

## 2017-04-19 DIAGNOSIS — Y92000 Kitchen of unspecified non-institutional (private) residence as  the place of occurrence of the external cause: Secondary | ICD-10-CM | POA: Diagnosis not present

## 2017-04-19 DIAGNOSIS — Z87891 Personal history of nicotine dependence: Secondary | ICD-10-CM | POA: Insufficient documentation

## 2017-04-19 DIAGNOSIS — S80211A Abrasion, right knee, initial encounter: Secondary | ICD-10-CM | POA: Diagnosis not present

## 2017-04-19 DIAGNOSIS — Z79899 Other long term (current) drug therapy: Secondary | ICD-10-CM | POA: Insufficient documentation

## 2017-04-19 DIAGNOSIS — Y9301 Activity, walking, marching and hiking: Secondary | ICD-10-CM | POA: Diagnosis not present

## 2017-04-19 DIAGNOSIS — I10 Essential (primary) hypertension: Secondary | ICD-10-CM | POA: Diagnosis not present

## 2017-04-19 HISTORY — DX: Unspecified osteoarthritis, unspecified site: M19.90

## 2017-04-19 MED ORDER — TRAMADOL HCL 50 MG PO TABS
50.0000 mg | ORAL_TABLET | Freq: Once | ORAL | Status: AC
  Administered 2017-04-20: 50 mg via ORAL
  Filled 2017-04-19: qty 1

## 2017-04-19 NOTE — ED Provider Notes (Signed)
[facility]-EMERGENCY DEPT Provider Note   CSN: [id number] Arrival date & time: 04/19/17  2222     History   Chief Complaint Chief Complaint  Patient presents with  . Fall    HPI Kristin Gibson is a 61 y.o. female with a hx of BL OA of knees. Patient states that she tripped over a dog toy and landed directly on her Knee. She also smacked her hand on the stove on the sway down. She c/o pain in the R knee and difficulty ambulating. The r hand is also sore. She did not hit her head or loose consciousness. She takes celebrex and tramadol for pain.  HPI  Past Medical History:  Diagnosis Date  . Arthritis   . Diverticulitis   . Hypertension     Patient Active Problem List   Diagnosis Date Noted  . HYPERTENSION 02/19/2008  . ARTHRITIS 02/19/2008  . LOW BACK PAIN 02/19/2008    Past Surgical History:  Procedure Laterality Date  . ABDOMINAL HYSTERECTOMY    . BREAST REDUCTION SURGERY    . KNEE SURGERY    . TUBAL LIGATION      OB History    No data available       Home Medications    Prior to Admission medications   Medication Sig Start Date End Date Taking? Authorizing Provider  acetaminophen (TYLENOL) 500 MG tablet Take 500 mg by mouth every 6 (six) hours as needed. pain    Historical Provider, MD  benazepril (LOTENSIN) 40 MG tablet Take 80 mg by mouth daily.    Historical Provider, MD  Biotin 1000 MCG tablet Take 1,000 mcg by mouth daily.    Historical Provider, MD  Chlorpheniramine-DM (CORICIDIN COUGH/COLD) 4-30 MG TABS Take 1 tablet by mouth daily.    Historical Provider, MD  cholecalciferol (VITAMIN D) 1000 UNITS tablet Take 1,000 Units by mouth daily.    Historical Provider, MD  Guaifenesin 1200 MG TB12 Take 1 tablet (1,200 mg total) by mouth 2 (two) times daily. 02/24/17   Charlestine Night, PA-C  Lactobacillus (ULTIMATE PROBIOTIC FORMULA) CAPS Take 1 capsule by mouth daily.    Historical Provider, MD  magnesium oxide (MAG-OX) 400 MG tablet Take 400 mg by mouth  daily.    Historical Provider, MD  Multiple Vitamins-Minerals (MULTIVITAMIN WITH MINERALS) tablet Take 1 tablet by mouth daily.    Historical Provider, MD  Omega-3 Fatty Acids (FISH OIL TRIPLE STRENGTH) 1400 MG CAPS Take 1,400 mg by mouth daily.    Historical Provider, MD  POTASSIUM GLUCONATE PO Take 99 mg by mouth daily.    Historical Provider, MD  predniSONE (DELTASONE) 50 MG tablet Take 1 tablet (50 mg total) by mouth daily. 02/24/17   Charlestine Night, PA-C  promethazine-dextromethorphan (PROMETHAZINE-DM) 6.25-15 MG/5ML syrup Take 5 mLs by mouth 4 (four) times daily as needed for cough. 02/24/17   Charlestine Night, PA-C  traMADol-acetaminophen (ULTRACET) 37.5-325 MG per tablet Take 1 tablet by mouth every 6 (six) hours as needed. pain    Historical Provider, MD  triamterene-hydrochlorothiazide (MAXZIDE-25) 37.5-25 MG per tablet Take 1 tablet by mouth daily.    Historical Provider, MD    Family History Family History  Problem Relation Age of Onset  . Hypertension Mother   . Cancer Sister     Social History Social History  Substance Use Topics  . Smoking status: Former Games developer  . Smokeless tobacco: Never Used  . Alcohol use Yes     Comment: occasionally     Allergies  Sulfa antibiotics   Review of Systems Review of Systems  Musculoskeletal: Positive for arthralgias and joint swelling.  Skin: Positive for wound (abrasion to knee).     Physical Exam Updated Vital Signs BP (!) 138/92 (BP Location: Left Arm)   Pulse 74   Temp 98.1 F (36.7 C) (Oral)   Resp 20   Ht  (1.778 m)   Wt 126.6 kg   SpO2 99%   BMI 40.03 kg/m   Physical Exam  Constitutional: She is oriented to person, place, and time. She appears well-developed and well-nourished. No distress.  HENT:  Head: Normocephalic and atraumatic.  Eyes: Conjunctivae are normal. No scleral icterus.  Neck: Normal range of motion.  Cardiovascular: Normal rate, regular rhythm and normal heart sounds.  Exam  reveals no gallop and no friction rub.   No murmur heard. Pulmonary/Chest: Effort normal and breath sounds normal. No respiratory distress.  Abdominal: Soft. Bowel sounds are normal. She exhibits no distension and no mass. There is no tenderness. There is no guarding.  Musculoskeletal:  R hand without evidence of trauma. FROM and strength. NVI, Normal ipsilateral wrist and elbow. R knee with signs of Chronic OA. 2 cm abrasion over the R patella. Full PROM. Pain with active ROM  Neurological: She is alert and oriented to person, place, and time.  Skin: Skin is warm and dry. She is not diaphoretic.  Psychiatric: Her behavior is normal.  Nursing note and vitals reviewed.    ED Treatments / Results  Labs (all labs ordered are listed, but only abnormal results are displayed) Labs Reviewed - No data to display  EKG  EKG Interpretation None       Radiology Dg Knee Complete 4 Views Right  Result Date: 04/19/2017 CLINICAL DATA:  Right knee pain after slip and fall. Abrasion noted to the right knee. EXAM: RIGHT KNEE - COMPLETE 4+ VIEW COMPARISON:  None. FINDINGS: Lateral femorotibial and patellofemoral joint space narrowing with spurring is noted. No acute fracture nor dislocation is identified. No joint effusion. IMPRESSION: Osteoarthritis of the knee without acute osseous appearing abnormality. Electronically Signed   By: Tollie Eth M.D.   On: 04/19/2017 23:45   Dg Hand Complete Right  Result Date: 04/19/2017 CLINICAL DATA:  Tripped and fell. Pain over the right thumb and first metacarpal. EXAM: RIGHT HAND - COMPLETE 3+ VIEW COMPARISON:  None. FINDINGS: There is no evidence of fracture or dislocation. Circumscribed lytic lucency at the base of the distal phalanx of the right thumb may represent a small enchondroma or cyst. Degenerative joint space narrowing is seen at the base of the thumb metacarpal. Soft tissues are unremarkable. IMPRESSION: No acute fracture or malalignment. Possible  small enchondroma or cyst of the first distal phalanx. Electronically Signed   By: Tollie Eth M.D.   On: 04/19/2017 23:48    Procedures Procedures (including critical care time)  Medications Ordered in ED Medications  traMADol (ULTRAM) tablet 50 mg (not administered)     Initial Impression / Assessment and Plan / ED Course  I have reviewed the triage vital signs and the nursing notes.  Pertinent labs & imaging results that were available during my care of the patient were reviewed by me and considered in my medical decision making (see chart for details).     Patient X-Ray negative for obvious fracture or dislocation. Pain managed in ED. Pt advised to follow up with orthopedics if symptoms persist for possibility of missed fracture diagnosis. Conservative therapy recommended and discussed.  Patient will be dc home & is agreeable with above plan.   Final Clinical Impressions(s) / ED Diagnoses   Final diagnoses:  Abrasion of right knee, initial encounter  Contusion of right hand, initial encounter    New Prescriptions New Prescriptions   No medications on file     Arthor Captain, PA-C 04/22/17 5784    Rolan Bucco, MD 04/29/17 1125

## 2017-04-19 NOTE — ED Triage Notes (Signed)
Pt states she was walking in the kitchen and slipped on her dog's toy causing her to fall  Pt is c/o right hand pain and right knee pain  Pt has abrasion noted to right knee  Pt denies hitting her head or LOC

## 2017-04-19 NOTE — Discharge Instructions (Signed)
YOur xrays were negative.  Please continue to take your celebrex and tramadol. Ice the affected knee and hand. Follow up with your primary care doctor.  Return to the emergency department if you are unable to walk or for any new or worsening symptoms.

## 2017-04-19 NOTE — ED Notes (Signed)
Pt to xray

## 2017-07-29 DIAGNOSIS — F325 Major depressive disorder, single episode, in full remission: Secondary | ICD-10-CM | POA: Insufficient documentation

## 2017-07-29 DIAGNOSIS — E876 Hypokalemia: Secondary | ICD-10-CM | POA: Insufficient documentation

## 2017-08-01 ENCOUNTER — Ambulatory Visit: Payer: Medicare Other

## 2017-08-01 ENCOUNTER — Encounter: Payer: Medicare Other | Admitting: Podiatry

## 2017-08-01 ENCOUNTER — Other Ambulatory Visit: Payer: Self-pay

## 2017-08-01 DIAGNOSIS — M2141 Flat foot [pes planus] (acquired), right foot: Secondary | ICD-10-CM

## 2017-08-01 DIAGNOSIS — M2142 Flat foot [pes planus] (acquired), left foot: Principal | ICD-10-CM

## 2017-08-05 NOTE — Progress Notes (Signed)
This encounter was created in error - please disregard.

## 2017-08-20 ENCOUNTER — Ambulatory Visit: Payer: Self-pay | Admitting: Surgery

## 2017-09-03 ENCOUNTER — Ambulatory Visit: Payer: Medicare Other | Admitting: Podiatry

## 2017-09-26 ENCOUNTER — Ambulatory Visit (INDEPENDENT_AMBULATORY_CARE_PROVIDER_SITE_OTHER): Payer: Medicare Other

## 2017-09-26 ENCOUNTER — Ambulatory Visit (INDEPENDENT_AMBULATORY_CARE_PROVIDER_SITE_OTHER): Payer: Medicare Other | Admitting: Podiatry

## 2017-09-26 ENCOUNTER — Encounter: Payer: Self-pay | Admitting: Podiatry

## 2017-09-26 VITALS — BP 113/68 | HR 54 | Resp 16

## 2017-09-26 DIAGNOSIS — M2142 Flat foot [pes planus] (acquired), left foot: Secondary | ICD-10-CM | POA: Diagnosis not present

## 2017-09-26 DIAGNOSIS — K649 Unspecified hemorrhoids: Secondary | ICD-10-CM | POA: Insufficient documentation

## 2017-09-26 DIAGNOSIS — M2141 Flat foot [pes planus] (acquired), right foot: Secondary | ICD-10-CM | POA: Diagnosis not present

## 2017-09-26 DIAGNOSIS — K635 Polyp of colon: Secondary | ICD-10-CM | POA: Insufficient documentation

## 2017-09-26 NOTE — Progress Notes (Signed)
Subjective:  Patient ID: Kristin Gibson, female    DOB: [birth year],  MRN: [id number] HPI Chief Complaint  Patient presents with  . Foot Pain    Patient concerned with flat feet, notice they turn inward more, walking a lot increases pain, makes her legs her some days    61 y.o. female presents with the above complaint.     Past Medical History:  Diagnosis Date  . Arthritis   . Diverticulitis   . Hypertension    Past Surgical History:  Procedure Laterality Date  . ABDOMINAL HYSTERECTOMY    . BREAST REDUCTION SURGERY    . KNEE SURGERY    . TUBAL LIGATION      Current Outpatient Prescriptions:  .  amLODipine (NORVASC) 10 MG tablet, Take by mouth., Disp: , Rfl:  .  buPROPion (WELLBUTRIN XL) 150 MG 24 hr tablet, TAKE ONE TABLET BY MOUTH EVERY MORNING. NEED APPOINTMENT FOR ADDITIONAL REFILLS, Disp: , Rfl:  .  celecoxib (CELEBREX) 200 MG capsule, TAKE 1 CAPSULE BY MOUTH TWICE A DAY, Disp: , Rfl:  .  cloNIDine (CATAPRES) 0.1 MG tablet, Take by mouth., Disp: , Rfl:  .  diclofenac sodium (VOLTAREN) 1 % GEL, Apply topically., Disp: , Rfl:  .  dicyclomine (BENTYL) 20 MG tablet, TAKE TABLET BY MOUTH THREE TIMES A DAY AS NEEDED FOR PAIN, Disp: , Rfl:  .  hydrochlorothiazide (MICROZIDE) 12.5 MG capsule, Take by mouth., Disp: , Rfl:  .  potassium chloride SA (K-DUR,KLOR-CON) 20 MEQ tablet, Take by mouth., Disp: , Rfl:  .  acetaminophen (TYLENOL) 500 MG tablet, Take 500 mg by mouth every 6 (six) hours as needed. pain, Disp: , Rfl:  .  benazepril (LOTENSIN) 40 MG tablet, Take 80 mg by mouth daily., Disp: , Rfl:  .  cholecalciferol (VITAMIN D) 1000 UNITS tablet, Take 1,000 Units by mouth daily., Disp: , Rfl:  .  Lactobacillus (ULTIMATE PROBIOTIC FORMULA) CAPS, Take 1 capsule by mouth daily., Disp: , Rfl:  .  Multiple Vitamins-Minerals (MULTIVITAMIN WITH MINERALS) tablet, Take 1 tablet by mouth daily., Disp: , Rfl:  .  pantoprazole (PROTONIX) 40 MG tablet, Take by mouth., Disp: , Rfl:    Allergies  Allergen Reactions  . Sulfa Antibiotics   . Sulfur Nausea Only   Review of Systems  All other systems reviewed and are negative.  Objective:   Vitals:   09/26/17 1346  BP: 113/68  Pulse: (!) 54  Resp: 16    General: Well developed, nourished, in no acute distress, alert and oriented x3   Dermatological: Skin is warm, dry and supple bilateral. Nails x 10 are well maintained; remaining integument appears unremarkable at this time. There are no open sores, no preulcerative lesions, no rash or signs of infection present.  Vascular: Dorsalis Pedis artery and Posterior Tibial artery pedal pulses are 2/4 bilateral with immedate capillary fill time. Pedal hair growth present. No varicosities and no lower extremity edema present bilateral.   Neruologic: Grossly intact via light touch bilateral. Vibratory intact via tuning fork bilateral. Protective threshold with Semmes Wienstein monofilament intact to all pedal sites bilateral. Patellar and Achilles deep tendon reflexes 2+ bilateral. No Babinski or clonus noted bilateral.   Musculoskeletal: No gross boney pedal deformities bilateral. No pain, crepitus, or limitation noted with foot and ankle range of motion bilateral. Muscular strength 5/5 in all groups tested bilateral.Although his distal ankle full range of motion without crepitation. She does have significant calcaneal valgus bilaterally right greater than left with very little  ability to invert her subtalar joint utilizing a posterior tibial tendon. She does demonstrate a severe flatfoot deformity which is flexible in nature. Right greater than left.  Gait: Unassisted, Nonantalgic.    Radiographs:  3 views of the bilateral foot demonstrates a osseously mature individual with moderate to severe pes planus no coalitions identifiable today. No fractures or identifiable.  Assessment & Plan:   Assessment: Severe pes planus bilaterally right greater than left. 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  Plan: We discussed surgical intervention today and the possibility of triple arthrodesis gastroc recession Kristin Gibson. She understands this and is amenable to it if necessary however we would like to try orthotics first. She is not interested in a full [place] brace. She will follow up with Kristin Gibson for orthotics.     Kristin Gibson Kristin Gibson, [place]

## 2017-10-08 ENCOUNTER — Ambulatory Visit (INDEPENDENT_AMBULATORY_CARE_PROVIDER_SITE_OTHER): Payer: Medicare Other | Admitting: Orthotics

## 2017-10-08 DIAGNOSIS — M2142 Flat foot [pes planus] (acquired), left foot: Secondary | ICD-10-CM

## 2017-10-08 DIAGNOSIS — M2141 Flat foot [pes planus] (acquired), right foot: Secondary | ICD-10-CM

## 2017-10-08 NOTE — Progress Notes (Signed)
Patient presents today with pes planus feet and possible PTTD; complains of heel pain and also some posterior aspect of calcaneous.  Will fab Richy lab [place] Inverted. 4* RF varus skive and medial lateral flange deep heel cup.

## 2017-10-18 ENCOUNTER — Telehealth: Payer: Self-pay | Admitting: Podiatry

## 2017-10-18 NOTE — Telephone Encounter (Signed)
Pt left voicemail regarding her orthotics that Rick had casted pt for. She said we did not get prior authorization for them and the insurance company had not received anything from us stating the need for them.   I returned call and pt states she was not told that insurance would not cover and she did sign the paper with Raiford Nobleick  for it but said she does not want them because she cannot afford 398.00..  I did explain that we can do as cash pay and it would be 300.00 and she could do half down and set up payments and she is thinking about it and will let us know.

## 2017-10-31 ENCOUNTER — Encounter: Payer: Medicare Other | Admitting: Orthotics

## 2017-10-31 ENCOUNTER — Ambulatory Visit: Payer: Medicare Other | Admitting: Podiatry

## 2018-02-19 ENCOUNTER — Other Ambulatory Visit (HOSPITAL_COMMUNITY): Payer: Self-pay | Admitting: Surgery

## 2018-02-27 ENCOUNTER — Ambulatory Visit (HOSPITAL_COMMUNITY)
Admission: RE | Admit: 2018-02-27 | Discharge: 2018-02-27 | Disposition: A | Discharge status: Home / Self Care | Payer: Medicare HMO | Source: Ambulatory Visit | Attending: Surgery | Admitting: Surgery

## 2018-02-27 DIAGNOSIS — Z6841 Body Mass Index (BMI) 40.0 and over, adult: Secondary | ICD-10-CM | POA: Diagnosis not present

## 2018-03-03 ENCOUNTER — Ambulatory Visit: Payer: Medicare Other | Admitting: Skilled Nursing Facility1

## 2018-03-18 ENCOUNTER — Encounter: Payer: Medicare HMO | Attending: Surgery | Admitting: Registered"

## 2018-03-18 ENCOUNTER — Encounter: Payer: Self-pay | Admitting: Registered"

## 2018-03-18 DIAGNOSIS — Z8249 Family history of ischemic heart disease and other diseases of the circulatory system: Secondary | ICD-10-CM | POA: Diagnosis not present

## 2018-03-18 DIAGNOSIS — G8929 Other chronic pain: Secondary | ICD-10-CM | POA: Insufficient documentation

## 2018-03-18 DIAGNOSIS — K219 Gastro-esophageal reflux disease without esophagitis: Secondary | ICD-10-CM | POA: Diagnosis not present

## 2018-03-18 DIAGNOSIS — M1710 Unilateral primary osteoarthritis, unspecified knee: Secondary | ICD-10-CM | POA: Diagnosis not present

## 2018-03-18 DIAGNOSIS — Z87891 Personal history of nicotine dependence: Secondary | ICD-10-CM | POA: Diagnosis not present

## 2018-03-18 DIAGNOSIS — Z9851 Tubal ligation status: Secondary | ICD-10-CM | POA: Insufficient documentation

## 2018-03-18 DIAGNOSIS — Z833 Family history of diabetes mellitus: Secondary | ICD-10-CM | POA: Insufficient documentation

## 2018-03-18 DIAGNOSIS — Z8261 Family history of arthritis: Secondary | ICD-10-CM | POA: Insufficient documentation

## 2018-03-18 DIAGNOSIS — M25562 Pain in left knee: Secondary | ICD-10-CM | POA: Diagnosis not present

## 2018-03-18 DIAGNOSIS — Z888 Allergy status to other drugs, medicaments and biological substances status: Secondary | ICD-10-CM | POA: Insufficient documentation

## 2018-03-18 DIAGNOSIS — Z79899 Other long term (current) drug therapy: Secondary | ICD-10-CM | POA: Insufficient documentation

## 2018-03-18 DIAGNOSIS — M25561 Pain in right knee: Secondary | ICD-10-CM | POA: Diagnosis not present

## 2018-03-18 DIAGNOSIS — Z882 Allergy status to sulfonamides status: Secondary | ICD-10-CM | POA: Insufficient documentation

## 2018-03-18 DIAGNOSIS — I10 Essential (primary) hypertension: Secondary | ICD-10-CM | POA: Insufficient documentation

## 2018-03-18 DIAGNOSIS — E669 Obesity, unspecified: Secondary | ICD-10-CM

## 2018-03-18 DIAGNOSIS — Z809 Family history of malignant neoplasm, unspecified: Secondary | ICD-10-CM | POA: Insufficient documentation

## 2018-03-18 DIAGNOSIS — Z9889 Other specified postprocedural states: Secondary | ICD-10-CM | POA: Diagnosis not present

## 2018-03-18 DIAGNOSIS — M549 Dorsalgia, unspecified: Secondary | ICD-10-CM | POA: Diagnosis not present

## 2018-03-18 DIAGNOSIS — Z713 Dietary counseling and surveillance: Secondary | ICD-10-CM | POA: Diagnosis present

## 2018-03-18 DIAGNOSIS — Z90710 Acquired absence of both cervix and uterus: Secondary | ICD-10-CM | POA: Insufficient documentation

## 2018-03-18 DIAGNOSIS — G4733 Obstructive sleep apnea (adult) (pediatric): Secondary | ICD-10-CM | POA: Diagnosis not present

## 2018-03-18 DIAGNOSIS — Z811 Family history of alcohol abuse and dependence: Secondary | ICD-10-CM | POA: Diagnosis not present

## 2018-03-18 DIAGNOSIS — Z836 Family history of other diseases of the respiratory system: Secondary | ICD-10-CM | POA: Diagnosis not present

## 2018-03-18 DIAGNOSIS — M1990 Unspecified osteoarthritis, unspecified site: Secondary | ICD-10-CM | POA: Diagnosis not present

## 2018-03-18 DIAGNOSIS — F329 Major depressive disorder, single episode, unspecified: Secondary | ICD-10-CM | POA: Insufficient documentation

## 2018-03-18 NOTE — Progress Notes (Signed)
Pre-Op Assessment Visit: Pre-Operative Sleeve Gastrectomy Surgery  Medical Nutrition Therapy:  Appt start time: 3:07  End time: 4:19  Patient was seen on 03/18/2018 for Pre-Operative Nutrition Assessment. Assessment and letter of approval faxed to [facility] Bariatric Surgery Program coordinator on 03/18/2018.   Pt expectation of surgery: initially lose 40 lbs, reduce portion sizes, to have knee replacement surgery, reduce blood pressure medications, able to use stairs easier, increase mobility  Pt expectation of Dietitian: dietary guidelines   Start weight at [facility]: 298.0 BMI: 44.65  Pt states she is currently intermittent fasting along with granddaughter. Pt states she lives with daughter and granddaughter and when she buys snacks they normally eat them all and she is not left with good options. Pt states if she gets hungry at night she will drink water and go to bed. Pt states she is a little lactose intolerant.   Per insurance, pt needs 6 SWL visits prior to surgery. Pt states she is unsure where she will do her SWL visits; made next appt with [place].   24 hr Dietary Recall: First Meal: fried pork chop, egg Snack: pork skins or sometimes dry cereal Second Meal: bowl of mac and cheese Snack: sometimes fruit, nuts Third Meal: spare ribs, corn on cob Snack: none Beverages: water, kool aid, coffee (daily in colder months), herbal tea, Emergen-C  Encouraged to engage in 75 minutes of moderate physical activity including cardiovascular and weight baring weekly  Handouts given during visit include:  . Pre-Op Goals . Bariatric Surgery Protein Shakes . Vitamin and Mineral Recommendations  During the appointment today the following Pre-Op Goals were reviewed with the patient: . Track your food and beverage: MyFitness Pal or Baritastic App . Make healthy food choices . Begin to limit portion sizes . Limited concentrated sugars and fried foods . Keep fat/sugar in the single  digits per serving on         food labels . Practice CHEWING your food  (aim for 30 chews per bite or until applesauce consistency) . Practice not drinking 15 minutes before, during, and 30 minutes after each meal/snack . Avoid all carbonated beverages  . Avoid/limit caffeinated beverages  . Avoid all sugar-sweetened beverages . Avoid alcohol . Consume 3 meals per day; eat every 3-5 hours . Make a list of non-food related activities . Aim for 64-100 ounces of FLUID daily  . Aim for at least 60-80 grams of PROTEIN daily . Look for a liquid protein source that contain ?15 g protein and ?5 g carbohydrate  (ex: shakes, drinks, shots) . Physical activity is an important part of a healthy lifestyle so keep it moving!  Follow diet recommendations listed below Energy and Macronutrient Recommendations: Calories: 1600 Carbohydrate: 180 Protein: 120 Fat: 44  Demonstrated degree of understanding via:  Teach Back   Teaching Method Utilized:  Visual Auditory Hands on  Barriers to learning/adherence to lifestyle change: contemplative stage of change, limited ability to have daily well-balanced meals  Patient to call the Nutrition and Diabetes Education Services to enroll in Pre-Op and Post-Op Nutrition Education when surgery date is scheduled.

## 2018-04-14 ENCOUNTER — Ambulatory Visit: Payer: Medicare HMO | Admitting: Registered"

## 2018-04-29 ENCOUNTER — Ambulatory Visit: Payer: Medicare HMO | Admitting: Registered"

## 2018-05-22 ENCOUNTER — Encounter (INDEPENDENT_AMBULATORY_CARE_PROVIDER_SITE_OTHER): Payer: Medicare Other

## 2018-05-29 ENCOUNTER — Ambulatory Visit (INDEPENDENT_AMBULATORY_CARE_PROVIDER_SITE_OTHER): Payer: Medicare Other | Admitting: Family Medicine

## 2018-10-14 ENCOUNTER — Other Ambulatory Visit: Payer: Self-pay

## 2018-10-14 ENCOUNTER — Encounter (HOSPITAL_COMMUNITY): Payer: Self-pay | Admitting: Emergency Medicine

## 2018-10-14 ENCOUNTER — Emergency Department (HOSPITAL_COMMUNITY)
Admission: EM | Admit: 2018-10-14 | Discharge: 2018-10-14 | Disposition: A | Discharge status: Home / Self Care | Payer: Medicare HMO | Attending: Emergency Medicine | Admitting: Emergency Medicine

## 2018-10-14 DIAGNOSIS — F17200 Nicotine dependence, unspecified, uncomplicated: Secondary | ICD-10-CM | POA: Diagnosis not present

## 2018-10-14 DIAGNOSIS — G8929 Other chronic pain: Secondary | ICD-10-CM

## 2018-10-14 DIAGNOSIS — I10 Essential (primary) hypertension: Secondary | ICD-10-CM | POA: Insufficient documentation

## 2018-10-14 DIAGNOSIS — M25562 Pain in left knee: Secondary | ICD-10-CM | POA: Insufficient documentation

## 2018-10-14 DIAGNOSIS — Z79899 Other long term (current) drug therapy: Secondary | ICD-10-CM | POA: Insufficient documentation

## 2018-10-14 HISTORY — DX: Dorsalgia, unspecified: M54.9

## 2018-10-14 HISTORY — DX: Other chronic pain: G89.29

## 2018-10-14 HISTORY — DX: Other chronic pain: M25.569

## 2018-10-14 MED ORDER — OXYCODONE-ACETAMINOPHEN 5-325 MG PO TABS
1.0000 | ORAL_TABLET | Freq: Once | ORAL | Status: DC
  Filled 2018-10-14: qty 1

## 2018-10-14 MED ORDER — TRAMADOL HCL 50 MG PO TABS
50.0000 mg | ORAL_TABLET | Freq: Once | ORAL | Status: AC
  Administered 2018-10-14: 50 mg via ORAL
  Filled 2018-10-14: qty 1

## 2018-10-14 MED ORDER — LIDOCAINE 5 % EX PTCH: 1.0000 | MEDICATED_PATCH | CUTANEOUS | 0 refills | Status: DC

## 2018-10-14 MED ORDER — TRAMADOL HCL 50 MG PO TABS: 50.0000 mg | ORAL_TABLET | Freq: Two times a day (BID) | ORAL | 0 refills | Status: DC | PRN

## 2018-10-14 NOTE — ED Notes (Signed)
Patient verbalized understanding of discharge instructions, no questions. Patient out of ED with crutches in no distress

## 2018-10-14 NOTE — Discharge Instructions (Addendum)
Prescription given for Tramadol. Take medication as directed and do not operate machinery, drive a car, or work while taking this medication as it can make you drowsy.   You were also given a prescription for lidocaine patches.  As an alternative to filling the prescription for lidocaine patches you may purchase over-the-counter Salon [place]. Use as directed.  Continue using warm or cool compresses to help with your symptoms.  You were given crutches as well to use for comfort.  Please call your orthopedic doctor to make an appointment for follow-up and return to the emergency department for any new or worsening symptoms.

## 2018-10-14 NOTE — ED Provider Notes (Signed)
[facility]-EMERGENCY DEPT Provider Note   CSN: [id number] Arrival date & time: 10/14/18  1310     History   Chief Complaint Chief Complaint  Patient presents with  . Knee Pain    left    HPI Kristin Gibson is a 62 y.o. female.  HPI  Patient is a 62 year old female with a history of bilateral knee arthritis and chronic knee pain who presents emergency department today complaining of an acute exacerbation of left knee pain.  Patient states she has been receiving injections to her bilateral knees recently.  She most recently received an injection to both knees last week.  Since then she is continued to have pain and swelling at the left knee.  No redness, warmth, fevers or chills to the knee.  Pain is worse with movement and ambulation.  She has tried Tylenol, Motrin, Mobic and heating pad with no relief.  States she also has 2 knee braces at home which have not provided significant relief.  Past Medical History:  Diagnosis Date  . Arthritis   . Back pain   . Diverticulitis   . Hypertension   . Knee pain, chronic     Patient Active Problem List   Diagnosis Date Noted  . Colon polyp 09/26/2017  . Hemorrhoids 09/26/2017  . Hypokalemia 07/29/2017  . Major depressive disorder with single episode, in full remission (HCC) 07/29/2017  . Constipation 02/25/2017  . Diverticulitis of large intestine 02/25/2017  . Lower abdominal pain 02/25/2017  . GERD (gastroesophageal reflux disease) 01/15/2017  . Morbid obesity with BMI of 40.0-44.9, adult (HCC) 01/15/2017  . OAB (overactive bladder) 01/15/2017  . OSA (obstructive sleep apnea) 12/04/2016  . Primary osteoarthritis of both first carpometacarpal joints 11/20/2016  . Primary osteoarthritis of left knee 11/20/2016  . Primary osteoarthritis of right knee 11/20/2016  . Chronic pain of both knees 11/05/2016  . Edema leg 11/05/2016  . Hypertension 02/19/2008  . ARTHRITIS 02/19/2008  . LOW BACK PAIN  02/19/2008    Past Surgical History:  Procedure Laterality Date  . ABDOMINAL HYSTERECTOMY    . BREAST REDUCTION SURGERY    . KNEE SURGERY    . TUBAL LIGATION       OB History   None      Home Medications    Prior to Admission medications   Medication Sig Start Date End Date Taking? Authorizing Provider  acetaminophen (TYLENOL) 500 MG tablet Take 500 mg by mouth every 6 (six) hours as needed. pain    [provider]  amLODipine (NORVASC) 10 MG tablet Take by mouth. 08/08/17   [provider]  benazepril (LOTENSIN) 40 MG tablet Take 80 mg by mouth daily.    [provider]  buPROPion (WELLBUTRIN XL) 150 MG 24 hr tablet TAKE ONE TABLET BY MOUTH EVERY MORNING. NEED APPOINTMENT FOR ADDITIONAL REFILLS 07/29/17   [provider]  celecoxib (CELEBREX) 200 MG capsule TAKE 1 CAPSULE BY MOUTH TWICE A DAY 09/18/17   [provider]  cholecalciferol (VITAMIN D) 1000 UNITS tablet Take 1,000 Units by mouth daily.    [provider]  cloNIDine (CATAPRES) 0.1 MG tablet Take by mouth. 06/19/17 06/19/18  [provider]  diclofenac sodium (VOLTAREN) 1 % GEL Apply topically. 04/30/17   [provider]  dicyclomine (BENTYL) 20 MG tablet TAKE TABLET BY MOUTH THREE TIMES A DAY AS NEEDED FOR PAIN 12/05/16   [provider]  hydrochlorothiazide (MICROZIDE) 12.5 MG capsule Take by mouth. 12/25/16  [provider]  Lactobacillus (ULTIMATE PROBIOTIC FORMULA) CAPS Take 1 capsule by mouth daily.    [provider]  lidocaine (LIDODERM) 5 % Place 1 patch onto the skin daily. Remove & Discard patch within 12 hours or as directed by MD 10/14/18   Kaj Vasil S, PA-C  Multiple Vitamins-Minerals (MULTIVITAMIN WITH MINERALS) tablet Take 1 tablet by mouth daily.    [provider]  pantoprazole (PROTONIX) 40 MG tablet Take by mouth.    [provider]  potassium chloride SA (K-DUR,KLOR-CON) 20 MEQ tablet  Take by mouth. 08/19/17   [provider]  traMADol (ULTRAM) 50 MG tablet Take 1 tablet (50 mg total) by mouth every 12 (twelve) hours as needed. 10/14/18   Jaquille Kau S, PA-C    Family History Family History  Problem Relation Age of Onset  . Hypertension Mother   . Cancer Sister     Social History Social History   Tobacco Use  . Smoking status: Former Games developer  . Smokeless tobacco: Never Used  Substance Use Topics  . Alcohol use: Yes    Comment: occasionally  . Drug use: No     Allergies   Benazepril; Sulfa antibiotics; and Sulfur   Review of Systems Review of Systems  Constitutional: Negative for chills and fever.  Cardiovascular: Negative for leg swelling.  Musculoskeletal:       Left knee pain  Skin: Negative for color change.     Physical Exam Updated Vital Signs BP 120/84 (BP Location: Right Arm)   Pulse 65   Temp 98.6 F (37 C) (Oral)   Resp 20   Wt 132 kg   SpO2 100%   BMI 43.60 kg/m   Physical Exam  Constitutional: She appears well-developed and well-nourished. No distress.  HENT:  Head: Normocephalic and atraumatic.  Eyes: Conjunctivae are normal.  Neck: Neck supple.  Cardiovascular: Normal rate.  Pulmonary/Chest: Effort normal.  Abdominal: Soft.  Musculoskeletal: Normal range of motion.  TTP to the lateral aspect of the left knee. No significant joint swelling, erythema, or warmth. FROM of the knee. NVI. Ambulatory.   Neurological: She is alert.  Skin: Skin is warm and dry.  Psychiatric: She has a normal mood and affect.  Nursing note and vitals reviewed.    ED Treatments / Results  Labs (all labs ordered are listed, but only abnormal results are displayed) Labs Reviewed - No data to display  EKG None  Radiology No results found.  Procedures Procedures (including critical care time)  Medications Ordered in ED Medications  oxyCODONE-acetaminophen (PERCOCET/ROXICET) 5-325 MG per tablet 1 tablet (has no  administration in time range)     Initial Impression / Assessment and Plan / ED Course  I have reviewed the triage vital signs and the nursing notes.  Pertinent labs & imaging results that were available during my care of the patient were reviewed by me and considered in my medical decision making (see chart for details).  Reviewed patient chart in [place] narcotic database and she has no red flags.  Final Clinical Impressions(s) / ED Diagnoses   Final diagnoses:  Chronic pain of left knee   Patient complaining of worsening of her chronic left knee pain.  Is currently being followed by orthopedics for bilateral knee arthritis and is getting knee injections.  Had a knee injection last week that did not resolve her symptoms.  She still having pain.  No evidence of septic arthritis on exam.  Tenderness over the lateral aspect of  the left knee.  She has a knee sleeve and a knee immobilizer at home which I advised her to use.  We will also give her crutches.  Gave Rx for tramadol and Lidoderm patches and advised her to call her orthopedic office tomorrow to make an appointment for follow-up.  She voiced understanding of plan reasons to return to ED.  All questions answered.  ED Discharge Orders         Ordered    traMADol (ULTRAM) 50 MG tablet  Every 12 hours PRN     10/14/18 1523    lidocaine (LIDODERM) 5 %  Every 24 hours     10/14/18 1523           Tyresha Fede S, PA-C 10/14/18 1534    Raeford Razor, MD 10/14/18 1718

## 2018-10-14 NOTE — ED Triage Notes (Signed)
Pt report increased pain in l/knee. Pt stated that the "gel injections that she is getting is not helping"

## 2018-11-04 ENCOUNTER — Other Ambulatory Visit: Payer: Self-pay

## 2018-11-04 ENCOUNTER — Emergency Department (HOSPITAL_COMMUNITY): Payer: Medicare HMO

## 2018-11-04 ENCOUNTER — Emergency Department (HOSPITAL_COMMUNITY)
Admission: EM | Admit: 2018-11-04 | Discharge: 2018-11-04 | Disposition: A | Discharge status: Home / Self Care | Payer: Medicare HMO | Attending: Emergency Medicine | Admitting: Emergency Medicine

## 2018-11-04 ENCOUNTER — Encounter (HOSPITAL_COMMUNITY): Payer: Self-pay

## 2018-11-04 DIAGNOSIS — R079 Chest pain, unspecified: Secondary | ICD-10-CM | POA: Diagnosis not present

## 2018-11-04 DIAGNOSIS — Z79899 Other long term (current) drug therapy: Secondary | ICD-10-CM | POA: Insufficient documentation

## 2018-11-04 DIAGNOSIS — Z87891 Personal history of nicotine dependence: Secondary | ICD-10-CM | POA: Insufficient documentation

## 2018-11-04 DIAGNOSIS — F329 Major depressive disorder, single episode, unspecified: Secondary | ICD-10-CM | POA: Insufficient documentation

## 2018-11-04 DIAGNOSIS — I10 Essential (primary) hypertension: Secondary | ICD-10-CM | POA: Diagnosis not present

## 2018-11-04 LAB — BASIC METABOLIC PANEL
ANION GAP: 7 (ref 5–15)
BUN: 16 mg/dL (ref 8–23)
CHLORIDE: 104 mmol/L (ref 98–111)
CO2: 27 mmol/L (ref 22–32)
Calcium: 9.3 mg/dL (ref 8.9–10.3)
Creatinine, Ser: 1.11 mg/dL — ABNORMAL HIGH (ref 0.44–1.00)
GFR calc non Af Amer: 52 mL/min — ABNORMAL LOW (ref >60)
Glucose, Bld: 115 mg/dL — ABNORMAL HIGH (ref 70–99)
POTASSIUM: 3.7 mmol/L (ref 3.5–5.1)
SODIUM: 138 mmol/L (ref 135–145)

## 2018-11-04 LAB — POCT I-STAT TROPONIN I
TROPONIN I, POC: 0.01 ng/mL (ref 0.00–0.08)
Troponin i, poc: 0 ng/mL (ref 0.00–0.08)

## 2018-11-04 LAB — CBC
HEMATOCRIT: 40.5 % (ref 36.0–46.0)
HEMOGLOBIN: 12.5 g/dL (ref 12.0–15.0)
MCH: 29.1 pg (ref 26.0–34.0)
MCHC: 30.9 g/dL (ref 30.0–36.0)
MCV: 94.2 fL (ref 80.0–100.0)
NRBC: 0 % (ref 0.0–0.2)
Platelets: 201 10*3/uL (ref 150–400)
RBC: 4.3 MIL/uL (ref 3.87–5.11)
RDW: 14.4 % (ref 11.5–15.5)
WBC: 4.8 10*3/uL (ref 4.0–10.5)

## 2018-11-04 MED ORDER — METHOCARBAMOL 500 MG PO TABS: 500.0000 mg | ORAL_TABLET | Freq: Every evening | ORAL | 0 refills | Status: DC | PRN

## 2018-11-04 MED ORDER — MORPHINE SULFATE (PF) 4 MG/ML IV SOLN
4.0000 mg | Freq: Once | INTRAVENOUS | Status: AC
  Administered 2018-11-04: 4 mg via INTRAMUSCULAR
  Filled 2018-11-04: qty 1

## 2018-11-04 MED ORDER — MORPHINE SULFATE (PF) 4 MG/ML IV SOLN: 4.0000 mg | Freq: Once | INTRAVENOUS | Status: DC

## 2018-11-04 NOTE — ED Triage Notes (Signed)
Pt reports chest discomfort on left side under breast. Pt reports worsening pain upon movement. Pt denies SOB. Pt endorses nausea, no vomiting. Pt reports productive cough. Pt denies fevers. Pt denies cardiac hx.

## 2018-11-04 NOTE — ED Provider Notes (Signed)
[facility]-EMERGENCY DEPT Provider Note   CSN: [id number] Arrival date & time: 11/04/18  1028     History   Chief Complaint Chief Complaint  Patient presents with  . Chest Pain    HPI Kristin Gibson is a 62 y.o. female with a history of hypertension who presents to the ED today chest pain. Patient reports 3 days of intermittent chest pain.  She reports that she first noticed this 3 days ago when she was lying in bed and rolled over to her left side.  She reports this caused pain that she describes as an achy-like pain.  She notes that this pain was relieved when she rolled to her right side.  She reports any time she lie down and rolled to her left side she now had pain.  She presents today however because when she awoke she had the same achy-like pain on the left side of her chest but has since not gone away.  The pain does not radiate to her neck, jaw, back or either shoulder.  She notes it is not associate with shortness of breath, nausea, vomiting or diaphoresis.  The pain is nonexertional or pleuritic in nature.  She reports she took an extra dose of her clonidine this morning on top of her normal blood pressure medication to try and help with this.  She notes no relief.  No other interventions prior to arrival.  The patient denies any fever associate with this.  She does report that she has had a intermittent cough over the last 3 weeks however.  No reported hemoptysis.  Patient denies prior history of MI.  No previous cardiac work-up.  She does report family history of early CAD including her sister who has had 2 TIAs as well as one MI.  Patient denies any tobacco use.  She denies drug use.  She denies history of similar pain in the past.  She denies exogenous estrogen use, recent surgery, recent travel, immobilization, personal history of cancer or prior history of DVT/PE.   HPI  Past Medical History:  Diagnosis Date  . Arthritis   . Back pain   .  Diverticulitis   . Hypertension   . Knee pain, chronic     Patient Active Problem List   Diagnosis Date Noted  . Colon polyp 09/26/2017  . Hemorrhoids 09/26/2017  . Hypokalemia 07/29/2017  . Major depressive disorder with single episode, in full remission (HCC) 07/29/2017  . Constipation 02/25/2017  . Diverticulitis of large intestine 02/25/2017  . Lower abdominal pain 02/25/2017  . GERD (gastroesophageal reflux disease) 01/15/2017  . Morbid obesity with BMI of 40.0-44.9, adult (HCC) 01/15/2017  . OAB (overactive bladder) 01/15/2017  . OSA (obstructive sleep apnea) 12/04/2016  . Primary osteoarthritis of both first carpometacarpal joints 11/20/2016  . Primary osteoarthritis of left knee 11/20/2016  . Primary osteoarthritis of right knee 11/20/2016  . Chronic pain of both knees 11/05/2016  . Edema leg 11/05/2016  . Hypertension 02/19/2008  . ARTHRITIS 02/19/2008  . LOW BACK PAIN 02/19/2008    Past Surgical History:  Procedure Laterality Date  . ABDOMINAL HYSTERECTOMY    . BREAST REDUCTION SURGERY    . KNEE SURGERY    . TUBAL LIGATION       OB History   None      Home Medications    Prior to Admission medications   Medication Sig Start Date End Date Taking? Authorizing Provider  acetaminophen (TYLENOL) 500 MG tablet Take 500  mg by mouth every 6 (six) hours as needed. pain    [provider]  amLODipine (NORVASC) 10 MG tablet Take by mouth. 08/08/17   [provider]  benazepril (LOTENSIN) 40 MG tablet Take 80 mg by mouth daily.    [provider]  buPROPion (WELLBUTRIN XL) 150 MG 24 hr tablet TAKE ONE TABLET BY MOUTH EVERY MORNING. NEED APPOINTMENT FOR ADDITIONAL REFILLS 07/29/17   [provider]  celecoxib (CELEBREX) 200 MG capsule TAKE 1 CAPSULE BY MOUTH TWICE A DAY 09/18/17   [provider]  cholecalciferol (VITAMIN D) 1000 UNITS tablet Take 1,000 Units by mouth daily.    [provider]  cloNIDine (CATAPRES) 0.1  MG tablet Take by mouth. 06/19/17 06/19/18  [provider]  diclofenac sodium (VOLTAREN) 1 % GEL Apply topically. 04/30/17   [provider]  dicyclomine (BENTYL) 20 MG tablet TAKE TABLET BY MOUTH THREE TIMES A DAY AS NEEDED FOR PAIN 12/05/16   [provider]  hydrochlorothiazide (MICROZIDE) 12.5 MG capsule Take by mouth. 12/25/16   [provider]  Lactobacillus (ULTIMATE PROBIOTIC FORMULA) CAPS Take 1 capsule by mouth daily.    [provider]  lidocaine (LIDODERM) 5 % Place 1 patch onto the skin daily. Remove & Discard patch within 12 hours or as directed by MD 10/14/18   Couture, Cortni S, PA-C  Multiple Vitamins-Minerals (MULTIVITAMIN WITH MINERALS) tablet Take 1 tablet by mouth daily.    [provider]  pantoprazole (PROTONIX) 40 MG tablet Take by mouth.    [provider]  potassium chloride SA (K-DUR,KLOR-CON) 20 MEQ tablet Take by mouth. 08/19/17   [provider]  traMADol (ULTRAM) 50 MG tablet Take 1 tablet (50 mg total) by mouth every 12 (twelve) hours as needed. 10/14/18   Couture, Cortni S, PA-C    Family History Family History  Problem Relation Age of Onset  . Hypertension Mother   . Cancer Sister     Social History Social History   Tobacco Use  . Smoking status: Former Games developer  . Smokeless tobacco: Never Used  Substance Use Topics  . Alcohol use: Yes    Comment: occasionally  . Drug use: No     Allergies   Benazepril; Sulfa antibiotics; and Sulfur   Review of Systems Review of Systems  All other systems reviewed and are negative.    Physical Exam Updated Vital Signs BP 123/83 (BP Location: Right Arm)   Pulse (!) 48   Temp 98.2 F (36.8 C) (Oral)   Resp 18   Wt 132.9 kg   SpO2 100%   BMI 43.90 kg/m   Physical Exam  Constitutional: She appears well-developed and well-nourished.  HENT:  Head: Normocephalic and atraumatic.  Right Ear: External ear normal.  Left Ear: External ear  normal.  Nose: Nose normal.  Mouth/Throat: Uvula is midline, oropharynx is clear and moist and mucous membranes are normal. No tonsillar exudate.  Eyes: Pupils are equal, round, and reactive to light. Right eye exhibits no discharge. Left eye exhibits no discharge. No scleral icterus.  Neck: Trachea normal. Neck supple. No spinous process tenderness present. No neck rigidity. Normal range of motion present.  Cardiovascular: Regular rhythm and intact distal pulses. Bradycardia present.  No murmur heard. Pulses:      Radial pulses are 2+ on the right side, and 2+ on the left side.       Dorsalis pedis pulses are 2+ on the right side, and 2+ on the left side.  Posterior tibial pulses are 2+ on the right side, and 2+ on the left side.  No lower extremity swelling or edema. Calves symmetric in size bilaterally.  Pulmonary/Chest: Effort normal and breath sounds normal. She exhibits tenderness.  Abdominal: Soft. Bowel sounds are normal. There is no tenderness. There is no rebound and no guarding.  Musculoskeletal: She exhibits no edema.  Lymphadenopathy:    She has no cervical adenopathy.  Neurological: She is alert.  Skin: Skin is warm and dry. No rash noted. She is not diaphoretic.  Psychiatric: She has a normal mood and affect.  Nursing note and vitals reviewed.    ED Treatments / Results  Labs (all labs ordered are listed, but only abnormal results are displayed) Labs Reviewed  BASIC METABOLIC PANEL - Abnormal; Notable for the following components:      Result Value   Glucose, Bld 115 (*)    Creatinine, Ser 1.11 (*)    GFR calc non Af Amer 52 (*)    All other components within normal limits  CBC  I-STAT TROPONIN, ED  POCT I-STAT TROPONIN I  I-STAT TROPONIN, ED  POCT I-STAT TROPONIN I    EKG None  Radiology Dg Chest 2 View  Result Date: 11/04/2018 CLINICAL DATA:  Mid left chest pain with some cough and congestion for the past 3 days. Former smoker. EXAM: CHEST - 2  VIEW COMPARISON:  PA and lateral chest x-ray of February 27, 2018 FINDINGS: The lungs are adequately inflated. There is no focal infiltrate. There is no pleural effusion. The heart and pulmonary vascularity are normal. The mediastinum is normal in width. The bony thorax exhibits no acute abnormality. IMPRESSION: There is no active cardiopulmonary disease. Electronically Signed   By: David  [place] M.D.   On: 11/04/2018 12:20  Is  Procedures Procedures (including critical care time)  Medications Ordered in ED Medications  morphine 4 MG/ML injection 4 mg (4 mg Intramuscular Given 11/04/18 1235)     Initial Impression / Assessment and Plan / ED Course  I have reviewed the triage vital signs and the nursing notes.  Pertinent labs & imaging results that were available during my care of the patient were reviewed by me and considered in my medical decision making (see chart for details).     62 y.o. female with HTN presenting for chest pain. She reports chest pain occurs when lying on her left side and with palpation. There is no radiation of the pain. No N/V/diaphoresis/sob. No fever or cough. Pain is non-exertional. She reports that pain continued longer today so she took an extra clonidine. On exam, patient with bradycardia but regular rhythm. Lungs CTA b/l. Tenderness to chest wall. EKG with sinus bradycardia. Reviewed with my attending. Suspect likely related to extra clonidine the patient took. BP stable. She is chest pain free now.   Patient is to be discharged with recommendation to follow up with PCP in regards to today's hospital visit. Low suspicion for PE. No tachycardia or hypoxia. Denies recent surgery, travel, hx blood clots, immobilization, tx for cancer, or exogenous estrogen use. No SOB.  Labs reassuring. CXR unremarkable. Negative troponin x 2, HEART score is 3. Patient has been advised to return to the ED if chest pain becomes exertional, associated with diaphoresis or nausea, radiates  to left jaw/arm, worsens or becomes concerning in any way. Patient appears reliable for follow up and is agreeable to discharge. I advised the patient to follow-up with their primary care provider this week. I advised  the patient to return to the emergency department with new or worsening symptoms or new concerns. The patient verbalized understanding and agreement with plan. Patient stable for discharge. She agrees to call pcp today for follow up. Have also referred to cardiology.  Final Clinical Impressions(s) / ED Diagnoses   Final diagnoses:  Nonspecific chest pain    ED Discharge Orders    None       Princella PellegriniMaczis, Winfred Redel M, PA-C 11/04/18 1603    Shaune PollackIsaacs, Cameron, MD 11/05/18 [id number]

## 2018-11-04 NOTE — Discharge Instructions (Addendum)
Read instructions below for reasons to return to the Emergency Department. It is recommended that your follow up with your Primary Care Doctor in regards to today's visit. If you do not have a doctor, use the resource guide listed below to help you find one.  Begin taking over the counter Prilosec or Zegrid (omeprazole) as directed.   Tests performed today include: An EKG of your heart A chest x-ray Cardiac enzymes - a blood test for heart muscle damage x 2 Blood counts and electrolytes Vital signs. See below for your results today.   Chest Pain (Nonspecific)  HOME CARE INSTRUCTIONS  For the next few days, avoid physical activities that bring on chest pain. Continue physical activities as directed.  Do not smoke cigarettes or drink alcohol until your symptoms are gone. If you do smoke, it is time to quit. You may receive instructions and counseling on how to stop smoking. Only take over-the-counter or prescription medicine for pain, discomfort, or fever as directed by your caregiver.  Follow your caregiver's suggestions for further testing if your chest pain does not go away.  Keep any follow-up appointments you made. If you do not go to an appointment, you could develop lasting (chronic) problems with pain. If there is any problem keeping an appointment, you must call to reschedule.  SEEK MEDICAL CARE IF:  You think you are having problems from the medicine you are taking. Read your medicine instructions carefully.  Your chest pain does not go away, even after treatment.  You develop a rash with blisters on your chest.  SEEK IMMEDIATE MEDICAL CARE IF:  You have increased chest pain or pain that spreads to your arm, neck, jaw, back, or belly (abdomen).  You develop shortness of breath, an increasing cough, or you are coughing up blood.  You have severe back or abdominal pain, feel sick to your stomach (nauseous) or throw up (vomit).  You develop severe weakness, fainting, or chills.  You have  an oral temperature above 102 F (38.9 C), not controlled by medicine.  THIS IS AN EMERGENCY. Do not wait to see if the pain will go away. Get medical help at once. Call your local emergency services (911 in [place] Do not drive yourself to the hospital. Additional Information:  Your vital signs today were: BP (!) 135/93    Pulse (!) 42    Temp 98.2 F (36.8 C) (Oral)    Resp 15    Wt 132.9 kg    SpO2 92%    BMI 43.90 kg/m  If your blood pressure (BP) was elevated above 135/85 this visit, please have this repeated by your doctor within one month. ---------------

## 2019-01-14 ENCOUNTER — Encounter (HOSPITAL_COMMUNITY): Payer: Self-pay

## 2019-01-14 ENCOUNTER — Ambulatory Visit (HOSPITAL_COMMUNITY)
Admission: EM | Admit: 2019-01-14 | Discharge: 2019-01-14 | Disposition: A | Discharge status: Home / Self Care | Payer: Medicare Other | Attending: Family Medicine | Admitting: Family Medicine

## 2019-01-14 DIAGNOSIS — R8281 Pyuria: Secondary | ICD-10-CM | POA: Diagnosis present

## 2019-01-14 DIAGNOSIS — R1032 Left lower quadrant pain: Secondary | ICD-10-CM | POA: Insufficient documentation

## 2019-01-14 LAB — POCT URINALYSIS DIP (DEVICE)
Bilirubin Urine: NEGATIVE
Glucose, UA: NEGATIVE mg/dL
Ketones, ur: NEGATIVE mg/dL
NITRITE: NEGATIVE
Protein, ur: NEGATIVE mg/dL
SPECIFIC GRAVITY, URINE: 1.01 (ref 1.005–1.030)
UROBILINOGEN UA: 0.2 mg/dL (ref 0.0–1.0)
pH: 6 (ref 5.0–8.0)

## 2019-01-14 MED ORDER — DOXYCYCLINE HYCLATE 100 MG PO TABS: 100.0000 mg | ORAL_TABLET | Freq: Two times a day (BID) | ORAL | 0 refills | Status: DC

## 2019-01-14 MED ORDER — ONDANSETRON 8 MG PO TBDP: 8.0000 mg | ORAL_TABLET | Freq: Three times a day (TID) | ORAL | 0 refills | Status: DC | PRN

## 2019-01-14 MED ORDER — CIPROFLOXACIN HCL 500 MG PO TABS: 500.0000 mg | ORAL_TABLET | Freq: Two times a day (BID) | ORAL | 0 refills | Status: DC

## 2019-01-14 NOTE — ED Triage Notes (Addendum)
Pt present abdominal pain, vomiting, diarrhea. Symptoms started on  Monday night. Pt states she has been able to eat small portion of food.  Pt also is complaining of urinary  Frequency to bathroom

## 2019-01-14 NOTE — ED Provider Notes (Signed)
[facility]    CSN: [id number] Arrival date & time: 01/14/19  1406     History   Chief Complaint Chief Complaint  Patient presents with  . Abdominal Pain  . Emesis  . Diarrhea  . Urinary Frequency    HPI Kristin Gibson is a 63 y.o. female.   This is the initial visit for this 63 year old woman with illness that began on Monday.  She complains of abdominal pain, vomiting, diarrhea. Symptoms started on  Monday night. Pt states she has been able to eat small portion of food.  Pt also is complaining of urinary  frequency to bathroom   1. Achy constant bilateral lower quadrant pain "8" 0-10 scale. Onset Monday night. One episode of diarrhea and 2 episode of vomiting yesterday. Able to hold down mashed potatoes today. Denies chills. Reports that pain is worse than diverticulitis symptoms.  Pain is most noticeable midline above symphysis pubis.  2. Reports urinary frequency and incontinence without blood in urine or dysuria.     Past Medical History:  Diagnosis Date  . Arthritis   . Back pain   . Diverticulitis   . Hypertension   . Knee pain, chronic     Patient Active Problem List   Diagnosis Date Noted  . Colon polyp 09/26/2017  . Hemorrhoids 09/26/2017  . Hypokalemia 07/29/2017  . Major depressive disorder with single episode, in full remission (HCC) 07/29/2017  . Constipation 02/25/2017  . Diverticulitis of large intestine 02/25/2017  . Lower abdominal pain 02/25/2017  . GERD (gastroesophageal reflux disease) 01/15/2017  . Morbid obesity with BMI of 40.0-44.9, adult (HCC) 01/15/2017  . OAB (overactive bladder) 01/15/2017  . OSA (obstructive sleep apnea) 12/04/2016  . Primary osteoarthritis of both first carpometacarpal joints 11/20/2016  . Primary osteoarthritis of left knee 11/20/2016  . Primary osteoarthritis of right knee 11/20/2016  . Chronic pain of both knees 11/05/2016  . Edema leg 11/05/2016  . Hypertension 02/19/2008  . ARTHRITIS  02/19/2008  . LOW BACK PAIN 02/19/2008    Past Surgical History:  Procedure Laterality Date  . ABDOMINAL HYSTERECTOMY    . BREAST REDUCTION SURGERY    . KNEE SURGERY    . TUBAL LIGATION      OB History   No obstetric history on file.      Home Medications    Prior to Admission medications   Medication Sig Start Date End Date Taking? Authorizing Provider  acetaminophen (TYLENOL) 500 MG tablet Take 1,000 mg by mouth daily as needed for mild pain. pain     [provider]  amLODipine (NORVASC) 10 MG tablet Take 10 mg by mouth daily.  08/08/17   [provider]  ciprofloxacin (CIPRO) 500 MG tablet Take 1 tablet (500 mg total) by mouth 2 (two) times daily. 01/14/19   Elvina Sidle, MD  cloNIDine (CATAPRES) 0.1 MG tablet Take 0.1 mg by mouth 2 (two) times daily. 10/13/18   [provider]  dicyclomine (BENTYL) 20 MG tablet Take 20 mg by mouth 2 (two) times daily as needed for spasms.  12/05/16   [provider]  doxycycline (VIBRA-TABS) 100 MG tablet Take 1 tablet (100 mg total) by mouth 2 (two) times daily. 01/14/19   Elvina Sidle, MD  hydrochlorothiazide (MICROZIDE) 12.5 MG capsule Take by mouth. 12/25/16   [provider]  meloxicam (MOBIC) 7.5 MG tablet Take 7.5 mg by mouth daily. 10/03/18   [provider]  methocarbamol (ROBAXIN) 500 MG tablet Take 1 tablet (  500 mg total) by mouth at bedtime as needed for muscle spasms. 11/04/18   Maczis, Elmer SowMichael M, PA-C  ondansetron (ZOFRAN-ODT) 8 MG disintegrating tablet Take 1 tablet (8 mg total) by mouth every 8 (eight) hours as needed for nausea. 01/14/19   Elvina SidleLauenstein, Carmine Youngberg, MD  pantoprazole (PROTONIX) 40 MG tablet Take by mouth.    [provider]  potassium chloride SA (K-DUR,KLOR-CON) 20 MEQ tablet Take 40 mEq by mouth daily.  08/19/17   [provider]  spironolactone (ALDACTONE) 50 MG tablet Take 50 mg by mouth daily. 09/01/18   [provider]  traMADol  (ULTRAM) 50 MG tablet Take 1 tablet (50 mg total) by mouth every 12 (twelve) hours as needed. 10/14/18   Couture, Cortni S, PA-C    Family History Family History  Problem Relation Age of Onset  . Hypertension Mother   . Cancer Sister     Social History Social History   Tobacco Use  . Smoking status: Former Games developermoker  . Smokeless tobacco: Never Used  Substance Use Topics  . Alcohol use: Yes    Comment: occasionally  . Drug use: No     Allergies   Benazepril; Sulfa antibiotics; and Sulfur   Review of Systems Review of Systems   Physical Exam Triage Vital Signs ED Triage Vitals [01/14/19 1529]  Enc Vitals Group     BP 124/72     Pulse Rate 67     Resp 16     Temp 98 F (36.7 C)     Temp Source Oral     SpO2 100 %     Weight      Height      Head Circumference      Peak Flow      Pain Score      Pain Loc      Pain Edu?      Excl. in GC?    No data found.  Updated Vital Signs BP 124/72 (BP Location: Right Arm)   Pulse 67   Temp 98 F (36.7 C) (Oral)   Resp 16   SpO2 100%    Physical Exam Vitals signs and nursing note reviewed.  Constitutional:      Appearance: She is well-developed. She is obese.  HENT:     Mouth/Throat:     Mouth: Mucous membranes are moist.  Cardiovascular:     Rate and Rhythm: Normal rate and regular rhythm.     Heart sounds: Normal heart sounds.  Pulmonary:     Effort: Pulmonary effort is normal.  Abdominal:     Palpations: Abdomen is soft.     Tenderness: There is abdominal tenderness in the right lower quadrant, periumbilical area, suprapubic area and left lower quadrant. There is no right CVA tenderness, left CVA tenderness, guarding or rebound.  Neurological:     Mental Status: She is alert.      UC Treatments / Results  Labs (all labs ordered are listed, but only abnormal results are displayed) Labs Reviewed  POCT URINALYSIS DIP (DEVICE) - Abnormal; Notable for the following components:      Result Value   Hgb  urine dipstick SMALL (*)    Leukocytes, UA SMALL (*)    All other components within normal limits  URINE CULTURE    EKG None  Radiology No results found.  Procedures Procedures (including critical care time)  Medications Ordered in UC Medications - No data to display  Initial Impression / Assessment and Plan / UC  Course  I have reviewed the triage vital signs and the nursing notes.  Pertinent labs & imaging results that were available during my care of the patient were reviewed by me and considered in my medical decision making (see chart for details).    Final Clinical Impressions(s) / UC Diagnoses   Final diagnoses:  Abdominal pain, left lower quadrant  Pyuria     Discharge Instructions     If you are getting worse, you will need to be seen again in the Emergency Dept.    ED Prescriptions    Medication Sig Dispense Auth. Provider   ciprofloxacin (CIPRO) 500 MG tablet  (Status: Discontinued) Take 1 tablet (500 mg total) by mouth 2 (two) times daily. 14 tablet Elvina SidleLauenstein, Torin Modica, MD   doxycycline (VIBRA-TABS) 100 MG tablet  (Status: Discontinued) Take 1 tablet (100 mg total) by mouth 2 (two) times daily. 20 tablet Elvina SidleLauenstein, Barrington Worley, MD   ondansetron (ZOFRAN-ODT) 8 MG disintegrating tablet  (Status: Discontinued) Take 1 tablet (8 mg total) by mouth every 8 (eight) hours as needed for nausea. 12 tablet Elvina SidleLauenstein, Amari Burnsworth, MD   ciprofloxacin (CIPRO) 500 MG tablet Take 1 tablet (500 mg total) by mouth 2 (two) times daily. 14 tablet Elvina SidleLauenstein, Cordelia Bessinger, MD   doxycycline (VIBRA-TABS) 100 MG tablet Take 1 tablet (100 mg total) by mouth 2 (two) times daily. 20 tablet Elvina SidleLauenstein, Octavia Mottola, MD   ondansetron (ZOFRAN-ODT) 8 MG disintegrating tablet Take 1 tablet (8 mg total) by mouth every 8 (eight) hours as needed for nausea. 12 tablet Elvina SidleLauenstein, Ezio Wieck, MD     Controlled Substance Prescriptions [place] Controlled Substance Registry consulted? No   Elvina SidleLauenstein, Guillermina Shaft, MD 01/14/19 1616

## 2019-01-14 NOTE — Discharge Instructions (Addendum)
If you are getting worse, you will need to be seen again in the Emergency Dept.

## 2019-01-15 LAB — URINE CULTURE

## 2019-08-01 ENCOUNTER — Other Ambulatory Visit: Payer: Self-pay

## 2019-08-01 ENCOUNTER — Emergency Department (HOSPITAL_COMMUNITY)
Admission: EM | Admit: 2019-08-01 | Discharge: 2019-08-01 | Disposition: A | Discharge status: Home / Self Care | Payer: Medicare Other | Attending: Emergency Medicine | Admitting: Emergency Medicine

## 2019-08-01 ENCOUNTER — Encounter (HOSPITAL_COMMUNITY): Payer: Self-pay

## 2019-08-01 ENCOUNTER — Emergency Department (HOSPITAL_COMMUNITY): Payer: Medicare Other

## 2019-08-01 DIAGNOSIS — I10 Essential (primary) hypertension: Secondary | ICD-10-CM | POA: Diagnosis not present

## 2019-08-01 DIAGNOSIS — K625 Hemorrhage of anus and rectum: Secondary | ICD-10-CM | POA: Insufficient documentation

## 2019-08-01 DIAGNOSIS — R1030 Lower abdominal pain, unspecified: Secondary | ICD-10-CM | POA: Diagnosis not present

## 2019-08-01 DIAGNOSIS — Z87891 Personal history of nicotine dependence: Secondary | ICD-10-CM | POA: Diagnosis not present

## 2019-08-01 DIAGNOSIS — Z79899 Other long term (current) drug therapy: Secondary | ICD-10-CM | POA: Diagnosis not present

## 2019-08-01 DIAGNOSIS — K640 First degree hemorrhoids: Secondary | ICD-10-CM

## 2019-08-01 DIAGNOSIS — K922 Gastrointestinal hemorrhage, unspecified: Secondary | ICD-10-CM

## 2019-08-01 LAB — CBC WITH DIFFERENTIAL/PLATELET
Abs Immature Granulocytes: 0.02 10*3/uL (ref 0.00–0.07)
Basophils Absolute: 0 10*3/uL (ref 0.0–0.1)
Basophils Relative: 0 %
Eosinophils Absolute: 0.1 10*3/uL (ref 0.0–0.5)
Eosinophils Relative: 1 %
HCT: 42.5 % (ref 36.0–46.0)
Hemoglobin: 13.5 g/dL (ref 12.0–15.0)
Immature Granulocytes: 0 %
Lymphocytes Relative: 43 %
Lymphs Abs: 2.5 10*3/uL (ref 0.7–4.0)
MCH: 29.3 pg (ref 26.0–34.0)
MCHC: 31.8 g/dL (ref 30.0–36.0)
MCV: 92.4 fL (ref 80.0–100.0)
Monocytes Absolute: 0.5 10*3/uL (ref 0.1–1.0)
Monocytes Relative: 8 %
Neutro Abs: 2.8 10*3/uL (ref 1.7–7.7)
Neutrophils Relative %: 48 %
Platelets: 210 10*3/uL (ref 150–400)
RBC: 4.6 MIL/uL (ref 3.87–5.11)
RDW: 14.6 % (ref 11.5–15.5)
WBC: 5.9 10*3/uL (ref 4.0–10.5)
nRBC: 0 % (ref 0.0–0.2)

## 2019-08-01 LAB — URINALYSIS, ROUTINE W REFLEX MICROSCOPIC
Bilirubin Urine: NEGATIVE
Glucose, UA: NEGATIVE mg/dL
Hgb urine dipstick: NEGATIVE
Ketones, ur: NEGATIVE mg/dL
Leukocytes,Ua: NEGATIVE
Nitrite: NEGATIVE
Protein, ur: NEGATIVE mg/dL
Specific Gravity, Urine: 1.009 (ref 1.005–1.030)
pH: 6 (ref 5.0–8.0)

## 2019-08-01 LAB — COMPREHENSIVE METABOLIC PANEL
ALT: 15 U/L (ref 0–44)
AST: 17 U/L (ref 15–41)
Albumin: 4 g/dL (ref 3.5–5.0)
Alkaline Phosphatase: 71 U/L (ref 38–126)
Anion gap: 11 (ref 5–15)
BUN: 14 mg/dL (ref 8–23)
CO2: 24 mmol/L (ref 22–32)
Calcium: 9.6 mg/dL (ref 8.9–10.3)
Chloride: 105 mmol/L (ref 98–111)
Creatinine, Ser: 0.95 mg/dL (ref 0.44–1.00)
GFR calc Af Amer: >60 mL/min (ref >60)
GFR calc non Af Amer: >60 mL/min (ref >60)
Glucose, Bld: 88 mg/dL (ref 70–99)
Potassium: 4 mmol/L (ref 3.5–5.1)
Sodium: 140 mmol/L (ref 135–145)
Total Bilirubin: 0.5 mg/dL (ref 0.3–1.2)
Total Protein: 8 g/dL (ref 6.5–8.1)

## 2019-08-01 LAB — POC OCCULT BLOOD, ED: Fecal Occult Bld: POSITIVE — AB

## 2019-08-01 LAB — TYPE AND SCREEN
ABO/RH(D): O POS
Antibody Screen: NEGATIVE

## 2019-08-01 LAB — ABO/RH: ABO/RH(D): O POS

## 2019-08-01 MED ORDER — SODIUM CHLORIDE (PF) 0.9 % IJ SOLN
INTRAMUSCULAR | Status: AC
  Filled 2019-08-01: qty 50

## 2019-08-01 MED ORDER — SODIUM CHLORIDE 0.9 % IV BOLUS
1000.0000 mL | Freq: Once | INTRAVENOUS | Status: AC
  Administered 2019-08-01: 1000 mL via INTRAVENOUS

## 2019-08-01 MED ORDER — IOHEXOL 300 MG/ML  SOLN
100.0000 mL | Freq: Once | INTRAMUSCULAR | Status: AC | PRN
  Administered 2019-08-01: 100 mL via INTRAVENOUS

## 2019-08-01 MED ORDER — HYDROCORTISONE (PERIANAL) 2.5 % EX CREA: 1.0000 "application " | TOPICAL_CREAM | Freq: Two times a day (BID) | CUTANEOUS | 0 refills | Status: DC

## 2019-08-01 NOTE — ED Provider Notes (Signed)
[facility]-EMERGENCY DEPT Provider Note   CSN: [id number] Arrival date & time: 08/01/19  1326    History   Chief Complaint Chief Complaint  Patient presents with   GI Bleeding    HPI Kristin Gibson is a 63 y.o. female history of arthritis, diverticulitis, hypertension here presenting with rectal bleeding, lower abdominal pain.  Patient states that for the last 3 days, whenever she has a bowel movement, she noticed some blood in her stool.  States that it is bright red and slightly painful .  Patient denies any vomiting or fevers. She does have a history of diverticulitis and [facility].      The history is provided by the patient.    Past Medical History:  Diagnosis Date   Arthritis    Back pain    Diverticulitis    Hypertension    Knee pain, chronic     Patient Active Problem List   Diagnosis Date Noted   Colon polyp 09/26/2017   Hemorrhoids 09/26/2017   Hypokalemia 07/29/2017   Major depressive disorder with single episode, in full remission (HCC) 07/29/2017   Constipation 02/25/2017   Diverticulitis of large intestine 02/25/2017   Lower abdominal pain 02/25/2017   GERD (gastroesophageal reflux disease) 01/15/2017   Morbid obesity with BMI of 40.0-44.9, adult (HCC) 01/15/2017   OAB (overactive bladder) 01/15/2017   OSA (obstructive sleep apnea) 12/04/2016   Primary osteoarthritis of both first carpometacarpal joints 11/20/2016   Primary osteoarthritis of left knee 11/20/2016   Primary osteoarthritis of right knee 11/20/2016   Chronic pain of both knees 11/05/2016   Edema leg 11/05/2016   Hypertension 02/19/2008   ARTHRITIS 02/19/2008   LOW BACK PAIN 02/19/2008    Past Surgical History:  Procedure Laterality Date   ABDOMINAL HYSTERECTOMY     BREAST REDUCTION SURGERY     KNEE SURGERY     TUBAL LIGATION       OB History   No obstetric history on file.      Home Medications     Prior to Admission medications   Medication Sig Start Date End Date Taking? Authorizing Provider  acetaminophen (TYLENOL) 500 MG tablet Take 1,000 mg by mouth daily as needed for mild pain. pain    Yes [provider]  amLODipine (NORVASC) 10 MG tablet Take 10 mg by mouth daily.  08/08/17  Yes [provider]  cloNIDine (CATAPRES) 0.1 MG tablet Take 0.1 mg by mouth 2 (two) times daily. 10/13/18  Yes [provider]  dicyclomine (BENTYL) 20 MG tablet Take 20 mg by mouth 2 (two) times daily as needed for spasms.  12/05/16  Yes [provider]  meloxicam (MOBIC) 7.5 MG tablet Take 7.5 mg by mouth daily as needed for pain.  10/03/18  Yes [provider]  NON FORMULARY Take 1 tablet by mouth daily. CBD Gummies   Yes [provider]  NON FORMULARY Take 1 tablet by mouth daily. Shaga mushroom   Yes [provider]  pantoprazole (PROTONIX) 40 MG tablet Take by mouth.   Yes [provider]  potassium chloride SA (K-DUR,KLOR-CON) 20 MEQ tablet Take 40 mEq by mouth daily.  08/19/17  Yes [provider]  saccharomyces boulardii (FLORASTOR) 250 MG capsule Take 250 mg by mouth daily.   Yes [provider]  spironolactone (ALDACTONE) 50 MG tablet Take 50 mg by mouth daily. 09/01/18  Yes [provider]  ciprofloxacin (CIPRO) 500 MG tablet Take  1 tablet (500 mg total) by mouth 2 (two) times daily. Patient not taking: Reported on 08/01/2019 01/14/19   Elvina SidleLauenstein, Kurt, MD  doxycycline (VIBRA-TABS) 100 MG tablet Take 1 tablet (100 mg total) by mouth 2 (two) times daily. Patient not taking: Reported on 08/01/2019 01/14/19   Elvina SidleLauenstein, Kurt, MD  methocarbamol (ROBAXIN) 500 MG tablet Take 1 tablet (500 mg total) by mouth at bedtime as needed for muscle spasms. Patient not taking: Reported on 08/01/2019 11/04/18   Maczis, Elmer SowMichael M, PA-C  ondansetron (ZOFRAN-ODT) 8 MG disintegrating tablet Take 1 tablet (8 mg total) by mouth  every 8 (eight) hours as needed for nausea. Patient not taking: Reported on 08/01/2019 01/14/19   Elvina SidleLauenstein, Kurt, MD  traMADol (ULTRAM) 50 MG tablet Take 1 tablet (50 mg total) by mouth every 12 (twelve) hours as needed. Patient not taking: Reported on 08/01/2019 10/14/18   Karrie Meresouture, Cortni S, PA-C    Family History Family History  Problem Relation Age of Onset   Hypertension Mother    Cancer Sister     Social History Social History   Tobacco Use   Smoking status: Former Smoker   Smokeless tobacco: Never Used  Substance Use Topics   Alcohol use: Yes    Comment: occasionally   Drug use: No     Allergies   Benazepril, Sulfa antibiotics, and Sulfur   Review of Systems Review of Systems  Gastrointestinal: Positive for abdominal pain and blood in stool.  All other systems reviewed and are negative.    Physical Exam Updated Vital Signs BP 125/80    Pulse (!) 51    Temp 98.7 F (37.1 C) (Oral)    Resp 17    Ht 5\' 9"  (1.753 m)    Wt 133.4 kg    SpO2 100%    BMI 43.42 kg/m   Physical Exam Vitals signs and nursing note reviewed.  HENT:     Head: Normocephalic.     Right Ear: Tympanic membrane normal.     Left Ear: Tympanic membrane normal.     Nose: Nose normal.     Mouth/Throat:     Mouth: Mucous membranes are moist.  Eyes:     Extraocular Movements: Extraocular movements intact.     Pupils: Pupils are equal, round, and reactive to light.  Neck:     Musculoskeletal: Normal range of motion.  Cardiovascular:     Rate and Rhythm: Normal rate and regular rhythm.  Pulmonary:     Effort: Pulmonary effort is normal.     Breath sounds: Normal breath sounds.  Abdominal:     General: Abdomen is flat.     Palpations: Abdomen is soft.     Comments: Mild LLQ and RLQ tenderness   Genitourinary:    Comments: Rectal- small external hemorrhoid, no active bleeding or thrombosis.  Skin:    General: Skin is warm.     Capillary Refill: Capillary refill takes less than 2  seconds.  Neurological:     General: No focal deficit present.     Mental Status: She is alert and oriented to person, place, and time.  Psychiatric:        Mood and Affect: Mood normal.        Behavior: Behavior normal.      ED Treatments / Results  Labs (all labs ordered are listed, but only abnormal results are displayed) Labs Reviewed  URINALYSIS, ROUTINE W REFLEX MICROSCOPIC - Abnormal; Notable for the following components:  Result Value   Color, Urine STRAW (*)    All other components within normal limits  POC OCCULT BLOOD, ED - Abnormal; Notable for the following components:   Fecal Occult Bld POSITIVE (*)    All other components within normal limits  CBC WITH DIFFERENTIAL/PLATELET  COMPREHENSIVE METABOLIC PANEL  TYPE AND SCREEN  ABO/RH    EKG None  Radiology Ct Abdomen Pelvis W Contrast  Result Date: 08/01/2019 CLINICAL DATA:  Abdominal pain and distension. Gastroenteritis or colitis suspected. Bloody stools. History of diverticulitis. EXAM: CT ABDOMEN AND PELVIS WITH CONTRAST TECHNIQUE: Multidetector CT imaging of the abdomen and pelvis was performed using the standard protocol following bolus administration of intravenous contrast. CONTRAST:  128mL OMNIPAQUE IOHEXOL 300 MG/ML  SOLN COMPARISON:  Upper GI series performed 02/27/2018. FINDINGS: Lower chest: Minimal atelectasis or scarring in the lingula. The lung bases are otherwise clear. There is no pleural or pericardial effusion. There is a small hiatal hernia. Hepatobiliary: The liver is normal in density without suspicious focal abnormality. No evidence of gallstones, gallbladder wall thickening or biliary dilatation. Pancreas: Unremarkable. No pancreatic ductal dilatation or surrounding inflammatory changes. Spleen: Normal in size without focal abnormality. Adrenals/Urinary Tract: Both adrenal glands appear normal. There are numerous low-density renal lesions bilaterally measuring up to 2.6 cm in the interpolar  region of the right kidney, most consistent with cysts. Some of these are too small to optimally characterize, although no suspicious lesions are seen. There is no evidence of urinary tract calculus or hydronephrosis. Numerous gonadal vein and pelvic phleboliths bilaterally. The bladder appears unremarkable. Stomach/Bowel: No evidence of bowel wall thickening, distention or surrounding inflammatory change. There are prominent diverticular changes throughout the distal descending and sigmoid colon, but no evidence of acute inflammation. The appendix is not clearly visualized, although there is no pericecal inflammatory change. Vascular/Lymphatic: There are no enlarged abdominal or pelvic lymph nodes. Mild aortic and branch vessel atherosclerosis. Reproductive: Hysterectomy.  No adnexal mass. Other: No evidence of abdominal wall mass or hernia. No ascites. Musculoskeletal: No acute or significant osseous findings. There are degenerative changes in the sacroiliac joints and symphysis pubis. There is prominent facet disease in the lower lumbar spine, especially at L3-4. IMPRESSION: 1. No acute abdominal findings identified. No specific evidence of colitis. 2. Prominent descending and sigmoid colon diverticulosis without evidence of acute inflammation. 3. Additional incidental findings including bilateral renal cysts, mild Aortic Atherosclerosis (ICD10-I70.0) and degenerative changes in the spine and pelvis. Electronically Signed   By: Richardean Sale M.D.   On: 08/01/2019 18:19    Procedures Procedures (including critical care time)  Medications Ordered in ED Medications  sodium chloride (PF) 0.9 % injection (has no administration in time range)  sodium chloride 0.9 % bolus 1,000 mL (1,000 mLs Intravenous New Bag/Given 08/01/19 1510)  iohexol (OMNIPAQUE) 300 MG/ML solution 100 mL (100 mLs Intravenous Contrast Given 08/01/19 1746)     Initial Impression / Assessment and Plan / ED Course  I have reviewed the  triage vital signs and the nursing notes.  Pertinent labs & imaging results that were available during my care of the patient were reviewed by me and considered in my medical decision making (see chart for details).       Kristin Gibson is a 63 y.o. female here with abdominal pain, rectal bleeding. Consider diverticulosis vs diverticulitis. Will get labs, CT ab/pel. Will hydrate and reassess.   6:51 PM Hg is 13.5. Occ positive. CT showed diverticulosis and no diverticulitis. She does have  small external hemorrhoids.  If the bleeding is from diverticulosis or from hemorrhoids.  Since she is hemodynamically stable and her hemoglobin is stable and she is not on any blood thinners and she has GI follow-up, I think she can follow-up with GI outpatient. Will give anusol cream for now    Final Clinical Impressions(s) / ED Diagnoses   Final diagnoses:  None    ED Discharge Orders    None       Charlynne PanderYao, Janila Arrazola Hsienta, MD 08/01/19 [phone]

## 2019-08-01 NOTE — ED Notes (Signed)
Pt ambulated to restroom without assistance or incident.

## 2019-08-01 NOTE — Discharge Instructions (Addendum)
You have some hemorrhoids. Try anusol cream to help with bleeding   You also have diverticulosis on CT. Call your GI doctor on Monday to schedule colonoscopy   You are likely going to have some bleeding   Return to ER if you have more than 5 episodes of bleeding a day, severe abdominal pain, fever, vomiting

## 2019-08-01 NOTE — ED Triage Notes (Signed)
Pt presents with c/o rectal bleeding that started 3 days ago. Pt reports the blood is bright red in nature, each time she has a bowel movement. Pt also c/o rectal pain.

## 2019-10-18 IMAGING — CR DG CHEST 2V
2 series · 2 of 2 positions shown · non-contrast
Comparison: 02/24/2017

CLINICAL DATA: Preoperative evaluation for upcoming gastric surgery

EXAM:
CHEST - 2 VIEW

[w chest pa]
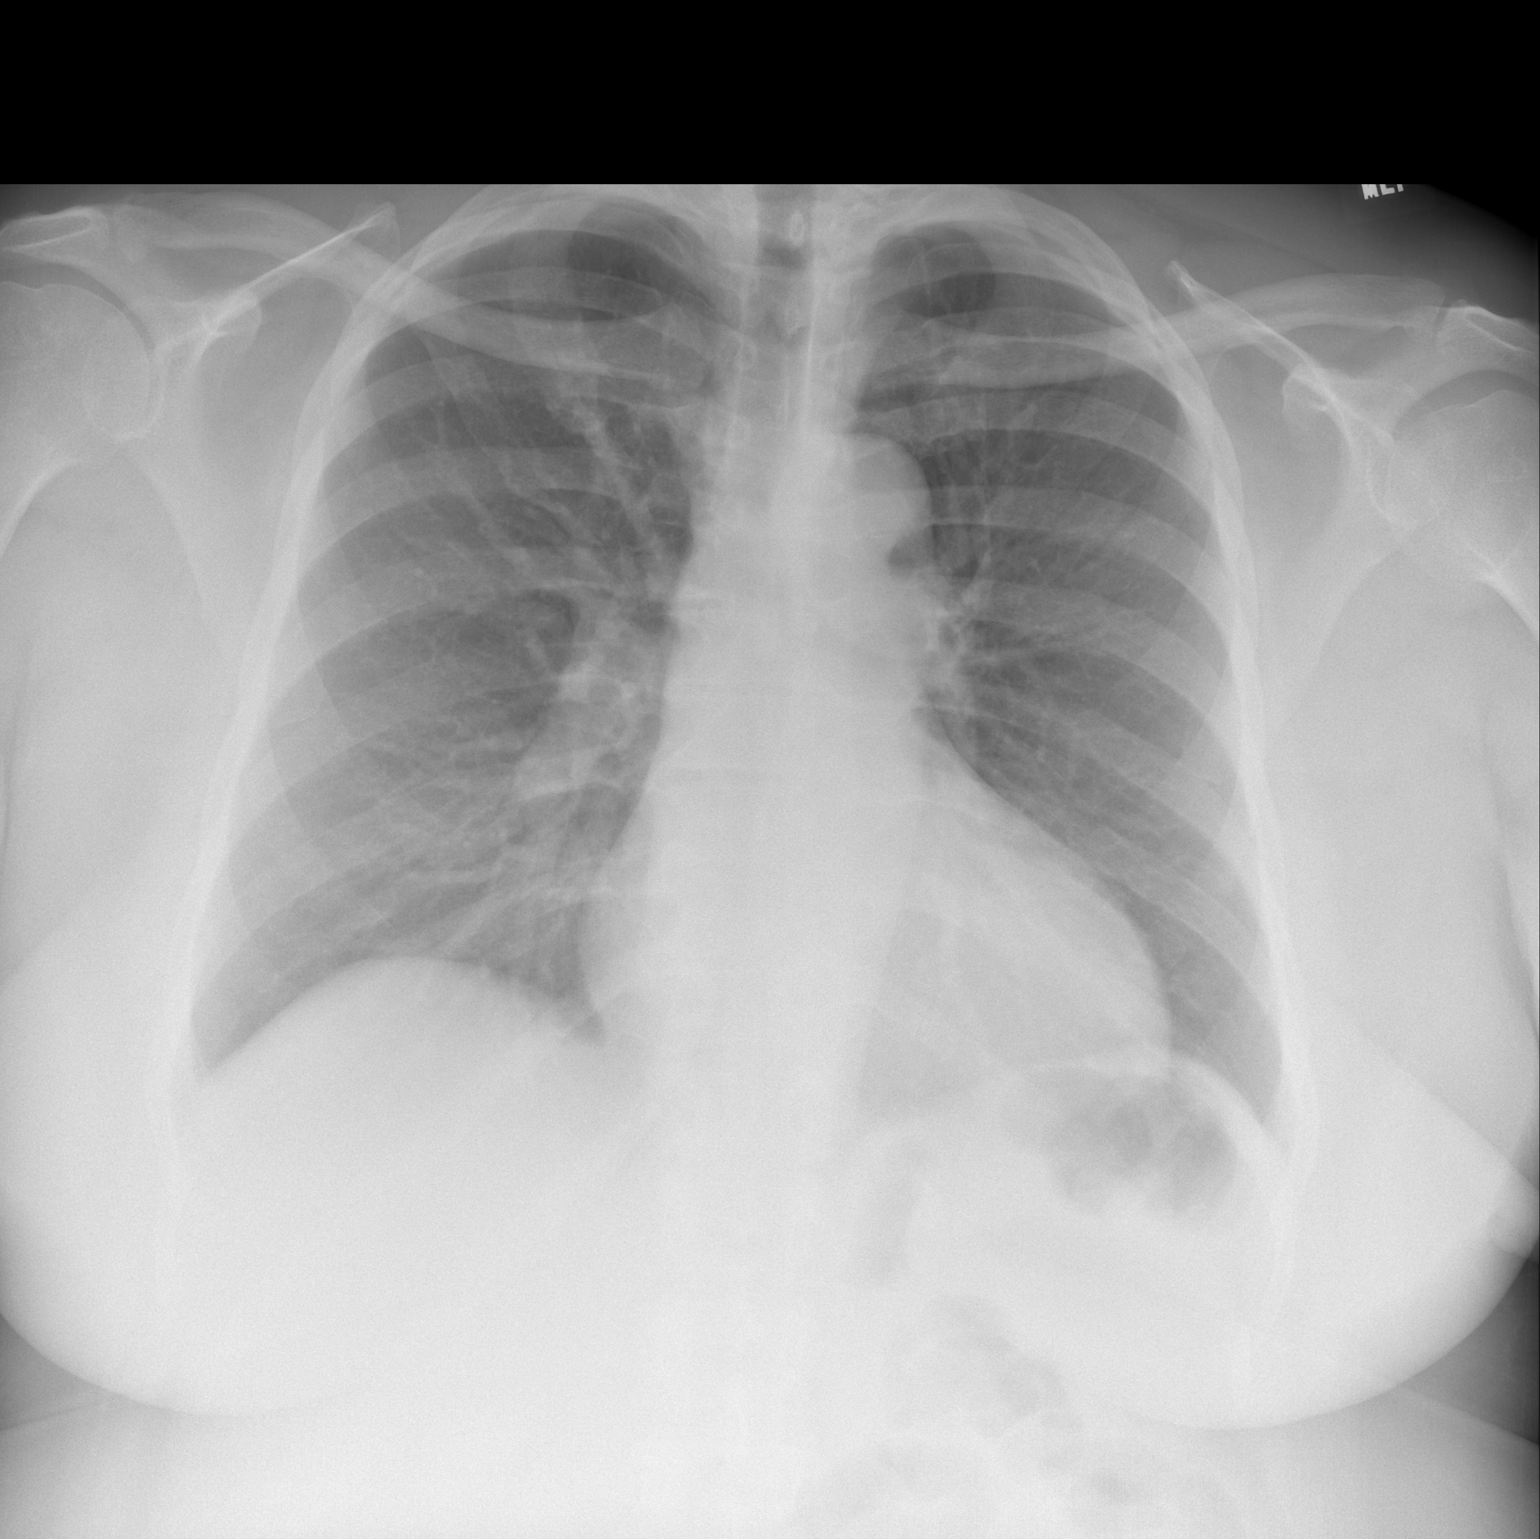

[w chest lat]
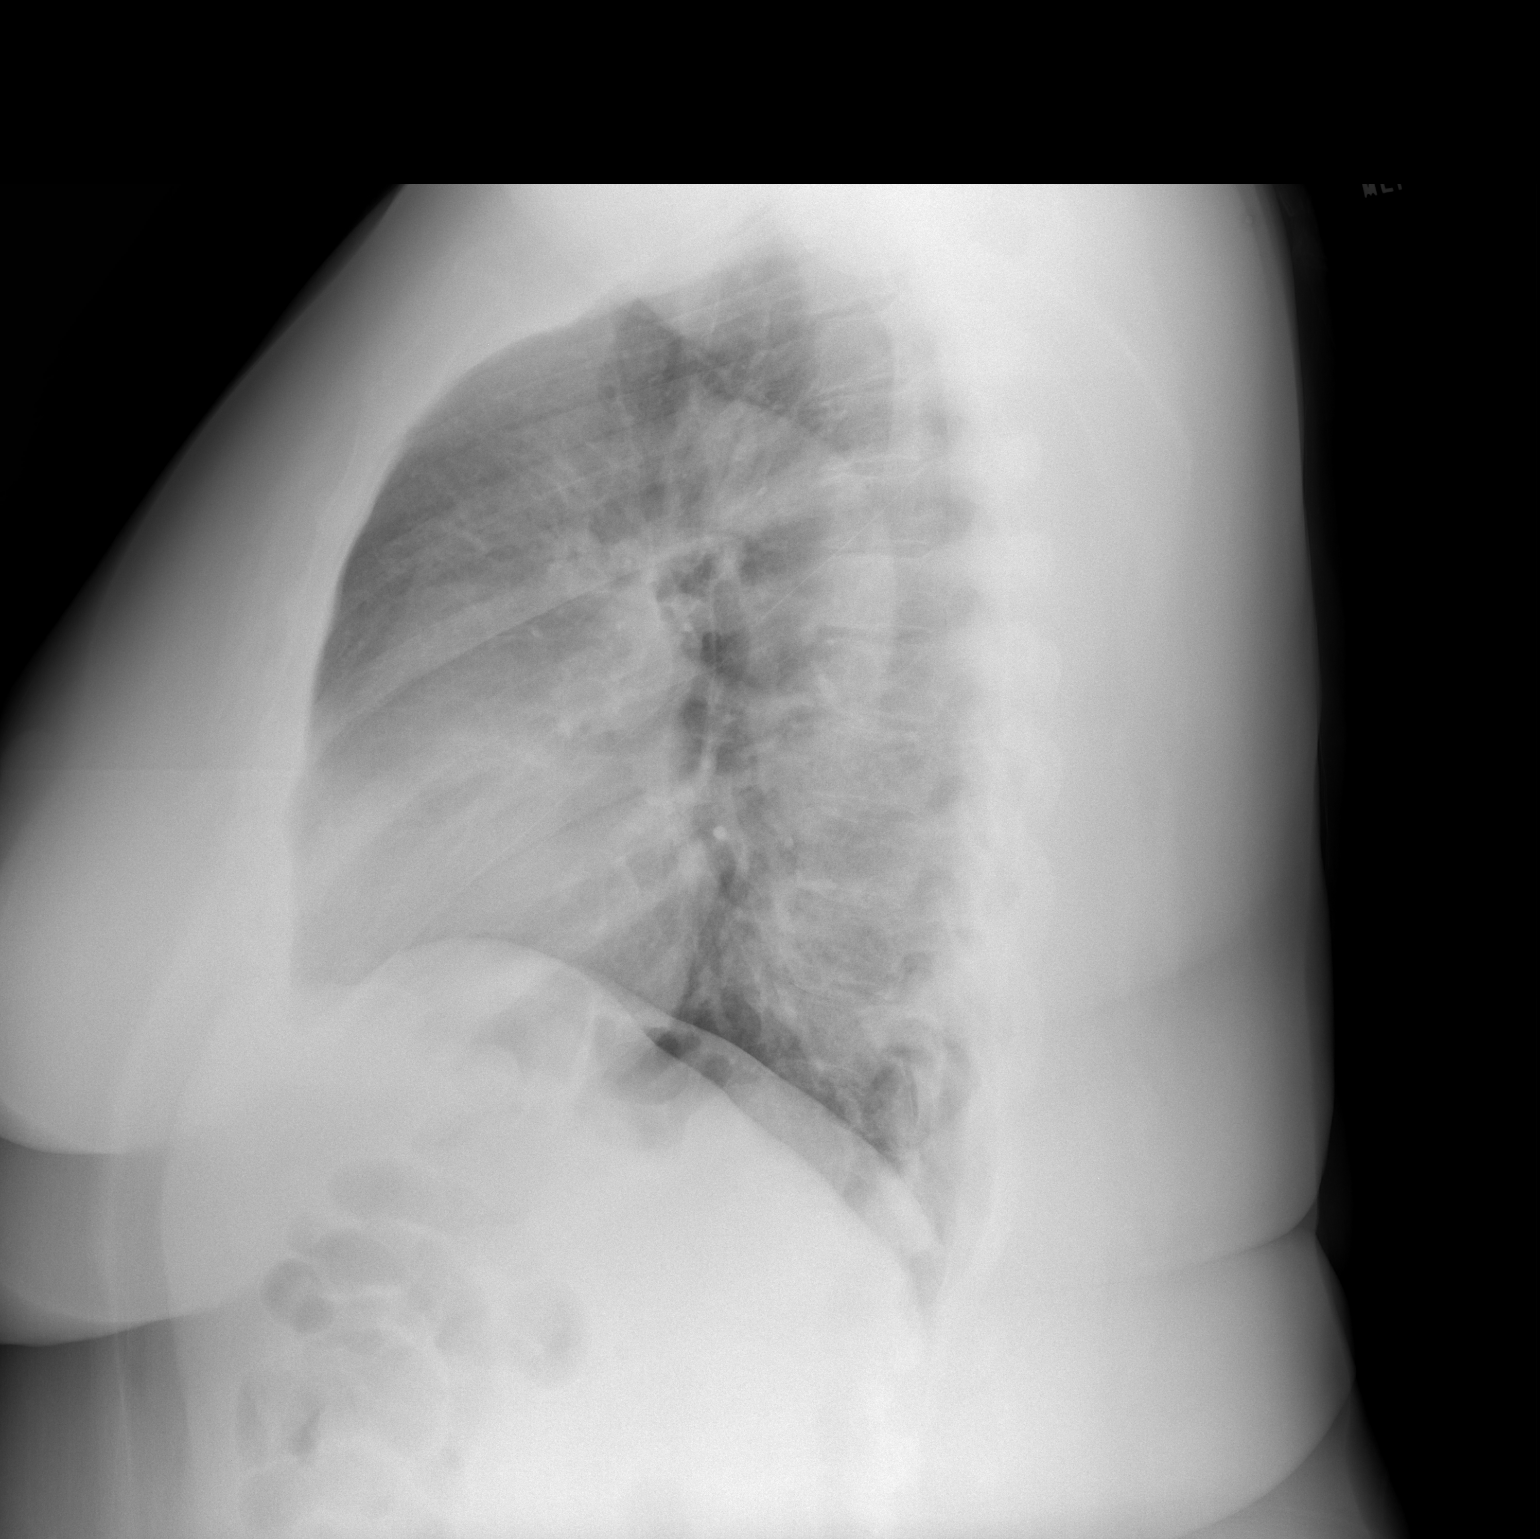

[2 of 2 positions shown; findings below may reference images not displayed]

FINDINGS: The heart size and mediastinal contours are within normal limits.
Both lungs are clear. The visualized skeletal structures are
unremarkable.
IMPRESSION: No active cardiopulmonary disease.

## 2020-06-28 ENCOUNTER — Emergency Department (HOSPITAL_COMMUNITY)
Admission: EM | Admit: 2020-06-28 | Discharge: 2020-06-28 | Disposition: A | Discharge status: Home / Self Care | Payer: Medicare Other | Attending: Emergency Medicine | Admitting: Emergency Medicine

## 2020-06-28 ENCOUNTER — Other Ambulatory Visit: Payer: Self-pay

## 2020-06-28 ENCOUNTER — Encounter (HOSPITAL_COMMUNITY): Payer: Self-pay

## 2020-06-28 ENCOUNTER — Emergency Department (HOSPITAL_COMMUNITY): Payer: Medicare Other

## 2020-06-28 DIAGNOSIS — K5792 Diverticulitis of intestine, part unspecified, without perforation or abscess without bleeding: Secondary | ICD-10-CM

## 2020-06-28 DIAGNOSIS — Z79899 Other long term (current) drug therapy: Secondary | ICD-10-CM | POA: Insufficient documentation

## 2020-06-28 DIAGNOSIS — I10 Essential (primary) hypertension: Secondary | ICD-10-CM | POA: Diagnosis not present

## 2020-06-28 DIAGNOSIS — Z87891 Personal history of nicotine dependence: Secondary | ICD-10-CM | POA: Diagnosis not present

## 2020-06-28 DIAGNOSIS — R1032 Left lower quadrant pain: Secondary | ICD-10-CM | POA: Diagnosis present

## 2020-06-28 DIAGNOSIS — R197 Diarrhea, unspecified: Secondary | ICD-10-CM | POA: Diagnosis not present

## 2020-06-28 LAB — COMPREHENSIVE METABOLIC PANEL
ALT: 14 U/L (ref 0–44)
AST: 15 U/L (ref 15–41)
Albumin: 3.8 g/dL (ref 3.5–5.0)
Alkaline Phosphatase: 69 U/L (ref 38–126)
Anion gap: 10 (ref 5–15)
BUN: 14 mg/dL (ref 8–23)
CO2: 26 mmol/L (ref 22–32)
Calcium: 9 mg/dL (ref 8.9–10.3)
Chloride: 103 mmol/L (ref 98–111)
Creatinine, Ser: 0.93 mg/dL (ref 0.44–1.00)
GFR calc Af Amer: >60 mL/min (ref >60)
GFR calc non Af Amer: >60 mL/min (ref >60)
Glucose, Bld: 109 mg/dL — ABNORMAL HIGH (ref 70–99)
Potassium: 4.3 mmol/L (ref 3.5–5.1)
Sodium: 139 mmol/L (ref 135–145)
Total Bilirubin: 0.6 mg/dL (ref 0.3–1.2)
Total Protein: 7.5 g/dL (ref 6.5–8.1)

## 2020-06-28 LAB — URINALYSIS, ROUTINE W REFLEX MICROSCOPIC
Bilirubin Urine: NEGATIVE
Glucose, UA: NEGATIVE mg/dL
Hgb urine dipstick: NEGATIVE
Ketones, ur: NEGATIVE mg/dL
Nitrite: NEGATIVE
Protein, ur: NEGATIVE mg/dL
Specific Gravity, Urine: 1.006 (ref 1.005–1.030)
pH: 6 (ref 5.0–8.0)

## 2020-06-28 LAB — CBC
HCT: 43.2 % (ref 36.0–46.0)
Hemoglobin: 13.8 g/dL (ref 12.0–15.0)
MCH: 30.3 pg (ref 26.0–34.0)
MCHC: 31.9 g/dL (ref 30.0–36.0)
MCV: 94.9 fL (ref 80.0–100.0)
Platelets: 204 10*3/uL (ref 150–400)
RBC: 4.55 MIL/uL (ref 3.87–5.11)
RDW: 14.7 % (ref 11.5–15.5)
WBC: 8.4 10*3/uL (ref 4.0–10.5)
nRBC: 0 % (ref 0.0–0.2)

## 2020-06-28 LAB — LIPASE, BLOOD: Lipase: 65 U/L — ABNORMAL HIGH (ref 11–51)

## 2020-06-28 MED ORDER — ONDANSETRON HCL 4 MG/2ML IJ SOLN
4.0000 mg | Freq: Once | INTRAMUSCULAR | Status: AC
  Administered 2020-06-28: 4 mg via INTRAVENOUS
  Filled 2020-06-28: qty 2

## 2020-06-28 MED ORDER — CIPROFLOXACIN HCL 500 MG PO TABS
500.0000 mg | ORAL_TABLET | Freq: Once | ORAL | Status: AC
  Administered 2020-06-28: 500 mg via ORAL
  Filled 2020-06-28: qty 1

## 2020-06-28 MED ORDER — HYDROMORPHONE HCL 1 MG/ML IJ SOLN
1.0000 mg | Freq: Once | INTRAMUSCULAR | Status: AC
  Administered 2020-06-28: 1 mg via INTRAVENOUS
  Filled 2020-06-28: qty 1

## 2020-06-28 MED ORDER — METRONIDAZOLE 500 MG PO TABS
500.0000 mg | ORAL_TABLET | Freq: Once | ORAL | Status: AC
  Administered 2020-06-28: 500 mg via ORAL
  Filled 2020-06-28: qty 1

## 2020-06-28 MED ORDER — IOHEXOL 300 MG/ML  SOLN
100.0000 mL | Freq: Once | INTRAMUSCULAR | Status: AC | PRN
  Administered 2020-06-28: 100 mL via INTRAVENOUS

## 2020-06-28 MED ORDER — SODIUM CHLORIDE 0.9 % IV SOLN: INTRAVENOUS | Status: DC

## 2020-06-28 MED ORDER — SODIUM CHLORIDE (PF) 0.9 % IJ SOLN
INTRAMUSCULAR | Status: AC
  Filled 2020-06-28: qty 50

## 2020-06-28 MED ORDER — OXYCODONE-ACETAMINOPHEN 5-325 MG PO TABS: 1.0000 | ORAL_TABLET | ORAL | 0 refills | Status: DC | PRN

## 2020-06-28 MED ORDER — PROBIOTIC PO CAPS: 1.0000 | ORAL_CAPSULE | Freq: Every day | ORAL | 0 refills | Status: AC

## 2020-06-28 MED ORDER — CIPROFLOXACIN HCL 250 MG PO TABS: 250.0000 mg | ORAL_TABLET | Freq: Two times a day (BID) | ORAL | 0 refills | Status: DC

## 2020-06-28 MED ORDER — METRONIDAZOLE 500 MG PO TABS: 500.0000 mg | ORAL_TABLET | Freq: Three times a day (TID) | ORAL | 0 refills | Status: DC

## 2020-06-28 MED ORDER — ONDANSETRON 4 MG PO TBDP: 4.0000 mg | ORAL_TABLET | Freq: Four times a day (QID) | ORAL | 0 refills | Status: DC | PRN

## 2020-06-28 NOTE — ED Triage Notes (Signed)
Pt sts lower abdominal pain radiates to her left side that is similar to her last diverticulitis flare up.

## 2020-06-28 NOTE — ED Notes (Signed)
Patient back from CT.

## 2020-06-28 NOTE — ED Provider Notes (Signed)
TIME SEEN: 6:04 AM  CHIEF COMPLAINT: Left lower quadrant abdominal pain  HPI: Patient is a 64 year old female with history of hypertension, diverticulitis who presents to the emergency department with left lower quadrant sharp abdominal pain ongoing for several days similar to previous episodes of diverticulitis.  Reports nausea, diarrhea and some suprapubic discomfort with urination but denies dysuria, hematuria, urinary frequency or urgency.  No vaginal bleeding or discharge.  Status post hysterectomy.  States pain progressively worsening.  No aggravating relieving factors.  ROS: See HPI Constitutional: no fever  Eyes: no drainage  ENT: no runny nose   Cardiovascular:  no chest pain  Resp: no SOB  GI: no vomiting GU: no dysuria Integumentary: no rash  Allergy: no hives  Musculoskeletal: no leg swelling  Neurological: no slurred speech ROS otherwise negative  PAST MEDICAL HISTORY/PAST SURGICAL HISTORY:  Past Medical History:  Diagnosis Date   Arthritis    Back pain    Diverticulitis    Hypertension    Knee pain, chronic     MEDICATIONS:  Prior to Admission medications   Medication Sig Start Date End Date Taking? Authorizing Provider  acetaminophen (TYLENOL) 500 MG tablet Take 1,000 mg by mouth daily as needed for mild pain. pain     [provider]  amLODipine (NORVASC) 10 MG tablet Take 10 mg by mouth daily.  08/08/17   [provider]  ciprofloxacin (CIPRO) 500 MG tablet Take 1 tablet (500 mg total) by mouth 2 (two) times daily. Patient not taking: Reported on 08/01/2019 01/14/19   Elvina Sidle, MD  cloNIDine (CATAPRES) 0.1 MG tablet Take 0.1 mg by mouth 2 (two) times daily. 10/13/18   [provider]  dicyclomine (BENTYL) 20 MG tablet Take 20 mg by mouth 2 (two) times daily as needed for spasms.  12/05/16   [provider]  doxycycline (VIBRA-TABS) 100 MG tablet Take 1 tablet (100 mg total) by mouth 2 (two) times daily. Patient not  taking: Reported on 08/01/2019 01/14/19   Elvina Sidle, MD  hydrocortisone (ANUSOL-HC) 2.5 % rectal cream Place 1 application rectally 2 (two) times daily. 08/01/19   Charlynne Pander, MD  meloxicam (MOBIC) 7.5 MG tablet Take 7.5 mg by mouth daily as needed for pain.  10/03/18   [provider]  methocarbamol (ROBAXIN) 500 MG tablet Take 1 tablet (500 mg total) by mouth at bedtime as needed for muscle spasms. Patient not taking: Reported on 08/01/2019 11/04/18   Maczis, Elmer Sow, PA-C  NON FORMULARY Take 1 tablet by mouth daily. CBD Gummies    [provider]  NON FORMULARY Take 1 tablet by mouth daily. Shaga mushroom    [provider]  ondansetron (ZOFRAN-ODT) 8 MG disintegrating tablet Take 1 tablet (8 mg total) by mouth every 8 (eight) hours as needed for nausea. Patient not taking: Reported on 08/01/2019 01/14/19   Elvina Sidle, MD  pantoprazole (PROTONIX) 40 MG tablet Take by mouth.    [provider]  potassium chloride SA (K-DUR,KLOR-CON) 20 MEQ tablet Take 40 mEq by mouth daily.  08/19/17   [provider]  saccharomyces boulardii (FLORASTOR) 250 MG capsule Take 250 mg by mouth daily.    [provider]  spironolactone (ALDACTONE) 50 MG tablet Take 50 mg by mouth daily. 09/01/18   [provider]  traMADol (ULTRAM) 50 MG tablet Take 1 tablet (50 mg total) by mouth every 12 (twelve) hours as needed. Patient not taking: Reported on 08/01/2019 10/14/18   Couture, [employer],  PA-C    ALLERGIES:  Allergies  Allergen Reactions   Benazepril Swelling   Sulfa Antibiotics    Sulfur Nausea Only    SOCIAL HISTORY:  Social History   Tobacco Use   Smoking status: Former Smoker   Smokeless tobacco: Never Used  Substance Use Topics   Alcohol use: Yes    Comment: occasionally    FAMILY HISTORY: Family History  Problem Relation Age of Onset   Hypertension Mother    Cancer Sister     EXAM: BP (!) 149/104    Pulse 68     Temp 98.8 F (37.1 C) (Oral)    Resp 16    Ht 5\' 9"  (1.753 m)    Wt 132.5 kg    SpO2 97%    BMI 43.12 kg/m  CONSTITUTIONAL: Alert and oriented and responds appropriately to questions.  Appears uncomfortable but afebrile nontoxic HEAD: Normocephalic EYES: Conjunctivae clear, pupils appear equal, EOM appear intact ENT: normal nose; moist mucous membranes NECK: Supple, normal ROM CARD: RRR; S1 and S2 appreciated; no murmurs, no clicks, no rubs, no gallops RESP: Normal chest excursion without splinting or tachypnea; breath sounds clear and equal bilaterally; no wheezes, no rhonchi, no rales, no hypoxia or respiratory distress, speaking full sentences ABD/GI: Normal bowel sounds; non-distended; soft, tender in the left lower quadrant, no guarding or rebound BACK:  The back appears normal EXT: Normal ROM in all joints; no deformity noted, no edema; no cyanosis SKIN: Normal color for age and race; warm; no rash on exposed skin NEURO: Moves all extremities equally PSYCH: The patient's mood and manner are appropriate.   MEDICAL DECISION MAKING: Patient here with left-sided abdominal pain.  She thinks this is similar to her diverticulitis.  She does appear to be quite tender on exam but no guarding, rebound or peritoneal signs.  Afebrile here.  Pain does not seem to be in the pelvic area and she is status post hysterectomy years ago.  She denies any vaginal bleeding or discharge.  She does have some suprapubic discomfort with urination.  There is also includes UTI, kidney stone pyelonephritis.  Doubt appendicitis.  Will obtain labs, urine, CT of abdomen pelvis.  Give IV fluids and nausea and pain medicine.  ED PROGRESS: Patient's labs, urine reassuring.  No sign of UTI.  CT scan confirms distal sigmoid diverticulitis without complication.  Patient reports pain is markedly improved.  Will discharge with Cipro, Flagyl, probiotics as well as pain and nausea medicine.  She has a PCP for follow-up.  At this  time, I do not feel there is any life-threatening condition present. I have reviewed, interpreted and discussed all results (EKG, imaging, lab, urine as appropriate) and exam findings with patient/family. I have reviewed nursing notes and appropriate previous records.  I feel the patient is safe to be discharged home without further emergent workup and can continue workup as an outpatient as needed. Discussed usual and customary return precautions. Patient/family verbalize understanding and are comfortable with this plan.  Outpatient follow-up has been provided as needed. All questions have been answered.      Kristin Gibson was evaluated in Emergency Department on 06/28/2020 for the symptoms described in the history of present illness. She was evaluated in the context of the global COVID-19 pandemic, which necessitated consideration that the patient might be at risk for infection with the SARS-CoV-2 virus that causes COVID-19. Institutional protocols and algorithms that pertain to the evaluation of patients at risk for COVID-19 are in a  state of rapid change based on information released by regulatory bodies including the CDC and federal and state organizations. These policies and algorithms were followed during the patient's care in the ED.      Kristin Gibson, Layla Maw, DO 06/28/20 [phone]

## 2021-02-07 ENCOUNTER — Other Ambulatory Visit: Payer: Self-pay | Admitting: Surgery

## 2021-02-07 ENCOUNTER — Other Ambulatory Visit (HOSPITAL_COMMUNITY): Payer: Self-pay | Admitting: Surgery

## 2021-02-09 ENCOUNTER — Telehealth (HOSPITAL_COMMUNITY): Payer: Self-pay

## 2021-02-10 ENCOUNTER — Telehealth (HOSPITAL_COMMUNITY): Payer: Self-pay

## 2021-02-16 ENCOUNTER — Encounter (HOSPITAL_COMMUNITY): Payer: Self-pay

## 2021-02-16 ENCOUNTER — Ambulatory Visit (HOSPITAL_COMMUNITY)
Admission: EM | Admit: 2021-02-16 | Discharge: 2021-02-16 | Disposition: A | Discharge status: Home / Self Care | Payer: Medicare Other | Attending: Family Medicine | Admitting: Family Medicine

## 2021-02-16 ENCOUNTER — Telehealth (HOSPITAL_COMMUNITY): Payer: Self-pay | Admitting: Emergency Medicine

## 2021-02-16 ENCOUNTER — Other Ambulatory Visit: Payer: Self-pay

## 2021-02-16 DIAGNOSIS — M25551 Pain in right hip: Secondary | ICD-10-CM | POA: Diagnosis not present

## 2021-02-16 MED ORDER — KETOROLAC TROMETHAMINE 30 MG/ML IJ SOLN
INTRAMUSCULAR | Status: AC
  Filled 2021-02-16: qty 1

## 2021-02-16 MED ORDER — PREDNISONE 20 MG PO TABS: ORAL_TABLET | ORAL | 0 refills | Status: AC

## 2021-02-16 MED ORDER — TIZANIDINE HCL 2 MG PO TABS: 2.0000 mg | ORAL_TABLET | Freq: Three times a day (TID) | ORAL | 0 refills | Status: DC | PRN

## 2021-02-16 MED ORDER — PREDNISONE 20 MG PO TABS
ORAL_TABLET | ORAL | 0 refills | Status: DC
Start: 2021-02-16 — End: 2021-02-16

## 2021-02-16 MED ORDER — KETOROLAC TROMETHAMINE 30 MG/ML IJ SOLN
30.0000 mg | Freq: Once | INTRAMUSCULAR | Status: AC
  Administered 2021-02-16: 30 mg via INTRAMUSCULAR

## 2021-02-16 NOTE — ED Triage Notes (Signed)
Pt in with c/o throbbing right hip pain that started yesterday  Pt took tylenol around 4 am today  with no relief  Denies any recent injury or fall

## 2021-02-16 NOTE — ED Provider Notes (Signed)
[facility]    CSN: [id number] Arrival date & time: 02/16/21  5400      History   Chief Complaint Chief Complaint  Patient presents with  . Hip Pain    HPI Kristin Gibson is a 65 y.o. female.   HPI  Patient presents today with a history of osteoarthritis involving multiple joints.  Patient presents with right hip pain which developed in the middle of the morning pain was so severe that it awakened her.  She has not had a injury.  Pain is exacerbated with sitting and laying.  Pain improves with standing and ambulation. Characterizes pain as aching and throbbing.  Patient is fully ambulatory. Past Medical History:  Diagnosis Date  . Arthritis   . Back pain   . Diverticulitis   . Hypertension   . Knee pain, chronic     Patient Active Problem List   Diagnosis Date Noted  . Colon polyp 09/26/2017  . Hemorrhoids 09/26/2017  . Hypokalemia 07/29/2017  . Major depressive disorder with single episode, in full remission (HCC) 07/29/2017  . Constipation 02/25/2017  . Diverticulitis of large intestine 02/25/2017  . Lower abdominal pain 02/25/2017  . GERD (gastroesophageal reflux disease) 01/15/2017  . Morbid obesity with BMI of 40.0-44.9, adult (HCC) 01/15/2017  . OAB (overactive bladder) 01/15/2017  . OSA (obstructive sleep apnea) 12/04/2016  . Primary osteoarthritis of both first carpometacarpal joints 11/20/2016  . Primary osteoarthritis of left knee 11/20/2016  . Primary osteoarthritis of right knee 11/20/2016  . Chronic pain of both knees 11/05/2016  . Edema leg 11/05/2016  . Hypertension 02/19/2008  . ARTHRITIS 02/19/2008  . LOW BACK PAIN 02/19/2008    Past Surgical History:  Procedure Laterality Date  . ABDOMINAL HYSTERECTOMY    . BREAST REDUCTION SURGERY    . KNEE SURGERY    . TUBAL LIGATION      OB History   No obstetric history on file.      Home Medications    Prior to Admission medications   Medication Sig Start Date End Date  Taking? Authorizing Provider  acetaminophen (TYLENOL) 500 MG tablet Take 1,000 mg by mouth daily as needed for mild pain. pain     [provider]  amLODipine (NORVASC) 10 MG tablet Take 10 mg by mouth daily.  08/08/17   [provider]  ciprofloxacin (CIPRO) 250 MG tablet Take 1 tablet (250 mg total) by mouth every 12 (twelve) hours. 06/28/20   Ward, Layla Maw, DO  cloNIDine (CATAPRES) 0.1 MG tablet Take 0.1 mg by mouth 2 (two) times daily. 10/13/18   [provider]  dicyclomine (BENTYL) 20 MG tablet Take 20 mg by mouth 2 (two) times daily as needed for spasms.  12/05/16   [provider]  hydrocortisone (ANUSOL-HC) 2.5 % rectal cream Place 1 application rectally 2 (two) times daily. 08/01/19   Charlynne Pander, MD  meloxicam (MOBIC) 7.5 MG tablet Take 7.5 mg by mouth daily as needed for pain.  10/03/18   [provider]  methocarbamol (ROBAXIN) 500 MG tablet Take 1 tablet (500 mg total) by mouth at bedtime as needed for muscle spasms. Patient not taking: Reported on 08/01/2019 11/04/18   Maczis, Elmer Sow, PA-C  metroNIDAZOLE (FLAGYL) 500 MG tablet Take 1 tablet (500 mg total) by mouth 3 (three) times daily. 06/28/20   Ward, Layla Maw, DO  NON FORMULARY Take 1 tablet by mouth daily. CBD Gummies    [provider]  NON FORMULARY Take  1 tablet by mouth daily. Shaga mushroom    [provider]  ondansetron (ZOFRAN ODT) 4 MG disintegrating tablet Take 1 tablet (4 mg total) by mouth every 6 (six) hours as needed for nausea or vomiting. 06/28/20   Ward, Layla Maw, DO  oxyCODONE-acetaminophen (PERCOCET/ROXICET) 5-325 MG tablet Take 1 tablet by mouth every 4 (four) hours as needed. 06/28/20   Ward, Layla Maw, DO  pantoprazole (PROTONIX) 40 MG tablet Take by mouth.    [provider]  potassium chloride SA (K-DUR,KLOR-CON) 20 MEQ tablet Take 40 mEq by mouth daily.  08/19/17   [provider]  Probiotic CAPS Take 1 capsule by mouth daily.  06/28/20   Ward, Layla Maw, DO  saccharomyces boulardii (FLORASTOR) 250 MG capsule Take 250 mg by mouth daily.    [provider]  spironolactone (ALDACTONE) 50 MG tablet Take 50 mg by mouth daily. 09/01/18   [provider]    Family History Family History  Problem Relation Age of Onset  . Hypertension Mother   . Cancer Sister     Social History Social History   Tobacco Use  . Smoking status: Former Games developer  . Smokeless tobacco: Never Used  Substance Use Topics  . Alcohol use: Yes    Comment: occasionally  . Drug use: No     Allergies   Benazepril, Sulfa antibiotics, and Elemental sulfur   Review of Systems Review of Systems Pertinent negatives listed in HPI  Physical Exam Triage Vital Signs ED Triage Vitals  Enc Vitals Group     BP 02/16/21 0933 134/85     Pulse Rate 02/16/21 0933 (!) 53     Resp 02/16/21 0933 19     Temp 02/16/21 0933 98.1 F (36.7 C)     Temp src --      SpO2 02/16/21 0933 98 %     Weight --      Height --      Head Circumference --      Peak Flow --      Pain Score 02/16/21 0931 10     Pain Loc --      Pain Edu? --      Excl. in GC? --    No data found.  Updated Vital Signs BP 134/85   Pulse (!) 53   Temp 98.1 F (36.7 C)   Resp 19   SpO2 98%   Visual Acuity Right Eye Distance:   Left Eye Distance:   Bilateral Distance:    Right Eye Near:   Left Eye Near:    Bilateral Near:     Physical Exam Constitutional: Patient is alert , obese, cooperative with exam HENT: Normocephalic, atraumatic, External right and left ear normal. Oropharynx is clear and moist.  Eyes: Conjunctivae and EOM are normal. PERRLA, no scleral icterus. Neck: Normal ROM. Neck supple. No JVD. No tracheal deviation. No thyromegaly. CVS: RRR, S1/S2 +, no murmurs, no gallops, no carotid bruit.  Pulmonary: Effort and breath sounds normal, no stridor, rhonchi, wheezes, rales.  Musculoskeletal: Right hip and left hip normal appearance, right hip  lateral pain with deep palpation-no mass.  Full range of motion. No edema and no tenderness.  Neuro: Alert. Normal reflexes, muscle tone coordination.  Skin: Skin is warm and dry. No rash noted. Not diaphoretic.  Psychiatric: Normal mood and affect. Behavior, judgment, thought content normal.  UC Treatments / Results  Labs (all labs ordered are listed, but only abnormal results are displayed) Labs Reviewed -  No data to display  EKG   Radiology No results found.  Procedures Procedures (including critical care time)  Medications Ordered in UC Medications - No data to display  Initial Impression / Assessment and Plan / UC Course  I have reviewed the triage vital signs and the nursing notes.  Pertinent labs & imaging results that were available during my care of the patient were reviewed by me and considered in my medical decision making (see chart for details).    Acute right hip pain likely related to osteoarthritis given history of osteoarthritis per recent abdomen pelvis CT spinal arthritic changes were notable on imaging. Patient received Toradol 30 mg IM here in clinic.  Will start on a prednisone taper x6 days.  Tizanidine for pain.  Advised to avoid driving while taking muscle relaxer. Follow-up with orthopedic provider that she is currently established with if pain persist after course of prednisone. Final Clinical Impressions(s) / UC Diagnoses   Final diagnoses:  Right hip pain     Discharge Instructions     Follow-up with orthopedic provider if hip pain persist following treatment with prednisone. Tizanidine prescribed for pain avoid driving or working while taking medication as it can cause severe drowsiness. Take prednisone with food.  Avoid taking later in the evening as it can cause insomnia.     ED Prescriptions    Medication Sig Dispense Auth. Provider   predniSONE (DELTASONE) 20 MG tablet Take 2 tablets (40 mg total) by mouth daily with breakfast for 2  days, THEN 1 tablet (20 mg total) daily with breakfast for 2 days, THEN 0.5 tablets (10 mg total) daily with breakfast for 2 days. Take 3 PO QAM x3days, 2 PO QAM x3days, 1 PO QAM x3days. 7 tablet Bing Neighbors, FNP   tiZANidine (ZANAFLEX) 2 MG tablet Take 1-2 tablets (2-4 mg total) by mouth every 8 (eight) hours as needed for muscle spasms (Hip  and or knee pain). 30 tablet Bing Neighbors, FNP     PDMP not reviewed this encounter.   Bing Neighbors, FNP 02/16/21 1121

## 2021-02-16 NOTE — Discharge Instructions (Addendum)
Follow-up with orthopedic provider if hip pain persist following treatment with prednisone. Tizanidine prescribed for pain avoid driving or working while taking medication as it can cause severe drowsiness. Take prednisone with food.  Avoid taking later in the evening as it can cause insomnia.

## 2021-02-16 NOTE — Telephone Encounter (Signed)
Error with original prescription, clarified with Selena Batten, APP and resending

## 2021-02-17 ENCOUNTER — Ambulatory Visit (HOSPITAL_COMMUNITY)
Admission: RE | Admit: 2021-02-17 | Discharge: 2021-02-17 | Disposition: A | Discharge status: Home / Self Care | Payer: Medicare Other | Source: Ambulatory Visit | Attending: Surgery | Admitting: Surgery

## 2021-02-17 ENCOUNTER — Other Ambulatory Visit: Payer: Self-pay

## 2021-03-09 ENCOUNTER — Ambulatory Visit: Payer: Medicare Other | Admitting: Skilled Nursing Facility1

## 2021-04-12 ENCOUNTER — Other Ambulatory Visit: Payer: Self-pay

## 2021-04-12 ENCOUNTER — Encounter: Payer: Medicare (Managed Care) | Attending: Surgery | Admitting: Skilled Nursing Facility1

## 2021-04-12 ENCOUNTER — Encounter: Payer: Self-pay | Admitting: Skilled Nursing Facility1

## 2021-04-12 DIAGNOSIS — Z713 Dietary counseling and surveillance: Secondary | ICD-10-CM | POA: Insufficient documentation

## 2021-04-12 DIAGNOSIS — Z6841 Body Mass Index (BMI) 40.0 and over, adult: Secondary | ICD-10-CM | POA: Insufficient documentation

## 2021-04-12 NOTE — Progress Notes (Signed)
Nutrition Assessment for Bariatric Surgery Medical Nutrition Therapy Appt Start Time: 8:52 End Time:   Patient was seen on 04/12/2021 for Pre-Operative Nutrition Assessment. Letter of approval faxed to [facility] bariatric surgery program coordinator on 04/12/2021  Referral stated Supervised Weight Loss (SWL) visits needed: 6  Planned surgery: Sleeve gastrectomy  Pt expectation of surgery: to lose weight Pt expectation of dietitian:   Goals for surgery: reduce blood pressure medications, able to use stairs easier, increase mobility   NUTRITION ASSESSMENT   Anthropometrics  Start weight at [facility]: 294.8 lbs (date: 04/12/2021)  Height: 69 in BMI: 43.52 kg/m2     Clinical  Medical hx: diverticulitis, HTN Medications: see list Labs: none updated in EMR Notable signs/symptoms: diverticulitis flare: pain in abdomen, knee pain, back pain  Any previous deficiencies? Potassium   Micronutrient Nutrition Focused Physical Exam: Hair: No issues observed Eyes: No issues observed Mouth: No issues observed Neck: No issues observed Nails: No issues observed Skin: No issues observed  Lifestyle & Dietary Hx  Pt states she avoids tomatoes stating they use to make her lips swell but when she does eat them she has no reaction.  Pt states she lost her taste for sweets over the last week so not eating them with regularity.  Pt states she does not eat white bread she eats bean sprout bread and eating more raw vegetables.  Pt states she lives on her own now and makes her own meals so that makes things easier.  Pt states she is lactose intolerant.  Pt states sometimes she will wake in the night and eat.   24-Hr Dietary Recall First Meal: egg + [place] bacon + sprouted toast Snack:  Second Meal: ham sandwich + potato salad Snack: roasted veggies Third Meal: [employer] [place] protein bar Snack:  Beverages: coffee, regular lemonade, lemonade + splenda, sweet tea, water   Estimated  Energy Needs Calories: 1800   NUTRITION DIAGNOSIS  Overweight/obesity ([place]-3.3) related to past poor dietary habits and physical inactivity as evidenced by patient w/ planned sleeve surgery following dietary guidelines for continued weight loss.    NUTRITION INTERVENTION  Nutrition counseling (C-1) and education (E-2) to facilitate bariatric surgery goals. Educated pt on insoluable fiber and diverticulits   Pre-Op Goals Reviewed with the Patient . Track food and beverage intake (pen and paper, MyFitness Pal, Baritastic app, etc.): to help track GI issues  . Make healthy food choices while monitoring portion sizes . Consume 3 meals per day or try to eat every 3-5 hours . Avoid concentrated sugars and fried foods . Keep sugar & fat in the single digits per serving on food labels . Practice CHEWING your food (aim for applesauce consistency) . Practice not drinking 15 minutes before, during, and 30 minutes after each meal and snack . Avoid all carbonated beverages (ex: soda, sparkling beverages)  . Limit caffeinated beverages (ex: coffee, tea, energy drinks) . Avoid all sugar-sweetened beverages (ex: regular soda, sports drinks): try spledna for sweet tea . Avoid alcohol  . Aim for 64-100 ounces of FLUID daily (with at least half of fluid intake being plain water)  . Aim for at least 60-80 grams of PROTEIN daily . Look for a liquid protein source that contains ?15 g protein and ?5 g carbohydrate (ex: shakes, drinks, shots) . Make a list of non-food related activities . Physical activity is an important part of a healthy lifestyle so keep it moving! The goal is to reach 150 minutes of exercise per week, including  cardiovascular and weight baring activity.  *Goals that are bolded indicate the pt would like to start working towards these  Handouts Provided Include  . Bariatric Surgery handouts (Nutrition Visits, Pre-Op Goals, Protein Shakes, Vitamins & Minerals)  Learning Style & Readiness  for Change Teaching method utilized: Visual & Auditory  Demonstrated degree of understanding via: Teach Back  Readiness Level: Action Barriers to learning/adherence to lifestyle change: none identified   RD's Notes for Next Visit . Assess pts adherence to chosen goals     MONITORING & EVALUATION Dietary intake, weekly physical activity, body weight, and pre-op goals reached at next nutrition visit.    Next Steps  Patient is to follow up at [facility] for Pre-Op Class >2 weeks before surgery for further nutrition education.  Return for next SWL

## 2021-05-11 ENCOUNTER — Encounter: Payer: Medicare (Managed Care) | Attending: Surgery | Admitting: Skilled Nursing Facility1

## 2021-05-11 ENCOUNTER — Other Ambulatory Visit: Payer: Self-pay

## 2021-05-11 DIAGNOSIS — Z6841 Body Mass Index (BMI) 40.0 and over, adult: Secondary | ICD-10-CM | POA: Diagnosis present

## 2021-05-11 DIAGNOSIS — I10 Essential (primary) hypertension: Secondary | ICD-10-CM | POA: Diagnosis not present

## 2021-05-11 DIAGNOSIS — K5792 Diverticulitis of intestine, part unspecified, without perforation or abscess without bleeding: Secondary | ICD-10-CM | POA: Insufficient documentation

## 2021-05-11 NOTE — Progress Notes (Signed)
Supervised Weight Loss Visit Bariatric Nutrition Education  1 out of 6 SWL Appointments   Referral stated Supervised Weight Loss (SWL) visits needed: 6  Planned surgery: Sleeve gastrectomy  Pt expectation of surgery: to lose weight Pt expectation of dietitian: none identified   Goals for surgery: reduce blood pressure medications, able to use stairs easier, increase mobility   NUTRITION ASSESSMENT   Anthropometrics  Start weight at [facility]: 294.8 lbs (date: 04/12/2021)  Weight: 291.3 pounds Height: 69 in BMI: 43.08 kg/m2     Clinical  Medical hx: diverticulitis, HTN Medications: see list Labs: none updated in EMR Notable signs/symptoms: diverticulitis flare: pain in abdomen, knee pain, back pain  Any previous deficiencies? Potassium   Lifestyle & Dietary Hx  Pt states he was able to cut back on sugary drinks and sometimes has toast in the morning and sandwich for lunch but has been trying to cut back on bread and has been cooking her vegetables instead of eating them raw which has really helped her GI symptoms. Pt states she notcied with oatmeal she will feel gassy the next day with discomfort (after further conversation realized it may be more due to the fact she ate 3 packets).  Pt states she will switch to a powder creamer and states she is going to try vanilla extract in her coffee.  Pt states she was drinking orange juice for potassium   Estimated daily fluid intake: unknown oz Supplements:  Current average weekly physical activity: walking 7 days a Week 10-20 minutes   24-Hr Dietary Recall First Meal: 3 packets oatmeal + orange or scrambled eggs + spinach or cereal  Snack: Second Meal: steak + beans Snack:  Third Meal: pork chops + green beans+ potatoes  Snack:  Beverages: coffee + flavored cream, orange juice  Estimated Energy Needs Calories: 1600   NUTRITION DIAGNOSIS  Overweight/obesity ([place]-3.3) related to past poor dietary habits and physical inactivity as  evidenced by patient w/ planned sleeve gastrectomy surgery following dietary guidelines for continued weight loss.   NUTRITION INTERVENTION  Nutrition counseling (C-1) and education (E-2) to facilitate bariatric surgery goals.  Pre-Op Goals Progress & New Goals . Track food and beverage intake (pen and paper, MyFitness Pal, Baritastic app, etc.): to help track GI issues  . Avoid all sugar-sweetened beverages (ex: regular soda, sports drinks): try spledna for sweet tea . NEW: reduce your orange juice to 4 ounces diluted with water . NEW: aim for 20 minutes per day walks, if that is too much for the dogs limit their walk to 10 minutes   Handouts Provided Include   N/A  Learning Style & Readiness for Change Teaching method utilized: Visual & Auditory  Demonstrated degree of understanding via: Teach Back  Readiness Level: action Barriers to learning/adherence to lifestyle change: none identified   RD's Notes for next Visit  . Assess pt   MONITORING & EVALUATION Dietary intake, weekly physical activity, body weight, and pre-op goals in 1 month.   Next Steps  Patient is to return to [facility] for next SWL

## 2021-06-13 ENCOUNTER — Encounter: Payer: Medicare (Managed Care) | Attending: Surgery | Admitting: Skilled Nursing Facility1

## 2021-06-13 ENCOUNTER — Other Ambulatory Visit: Payer: Self-pay

## 2021-06-13 DIAGNOSIS — Z6841 Body Mass Index (BMI) 40.0 and over, adult: Secondary | ICD-10-CM | POA: Insufficient documentation

## 2021-06-13 NOTE — Progress Notes (Signed)
Supervised Weight Loss Visit Bariatric Nutrition Education  2 out of 6 SWL Appointments   Referral stated Supervised Weight Loss (SWL) visits needed: 6  Planned surgery: Sleeve gastrectomy  Pt expectation of surgery: to lose weight Pt expectation of dietitian: none identified   Goals for surgery: reduce blood pressure medications, able to use stairs easier, increase mobility   NUTRITION ASSESSMENT   Anthropometrics  Start weight at [facility]: 294.8 lbs (date: 04/12/2021)  Weight: 297 pounds Height: 69 in BMI: 43.86 kg/m2     Clinical  Medical hx: diverticulitis, HTN Medications: see list Labs: none updated in EMR Notable signs/symptoms: diverticulitis flare: pain in abdomen, knee pain, back pain  Any previous deficiencies? Potassium   Lifestyle & Dietary Hx   Pt states she has to go for a sleep study soon. Pt states she has not been walking as much due to the heat but has been trying to walk a little in the evening when it is cooler.  Pt states she has eliminated fried foods.   Estimated daily fluid intake: unknown oz Supplements:  Current average weekly physical activity: walking 7 days a Week 10-20 minutes   24-Hr Dietary Recall First Meal: egg bites + toast Snack: protein bar Second Meal: chicken salad + cherries or protein bar Snack: cherries Third Meal: chicken legs or fish + broccoli + roll   Snack:  Beverages: coffee + splenda + powder creamer, 4 ounces orange juice + water, water, unsweet tea  Estimated Energy Needs Calories: 1600   NUTRITION DIAGNOSIS  Overweight/obesity ([place]-3.3) related to past poor dietary habits and physical inactivity as evidenced by patient w/ planned sleeve gastrectomy surgery following dietary guidelines for continued weight loss.   NUTRITION INTERVENTION  Nutrition counseling (C-1) and education (E-2) to facilitate bariatric surgery goals.  Pre-Op Goals Progress & New Goals Track food and beverage intake (pen and paper, MyFitness  Pal, Baritastic app, etc.): to help track GI issues  Avoid all sugar-sweetened beverages (ex: regular soda, sports drinks): try spledna for sweet tea Continue: reduce your orange juice to 4 ounces diluted with water continue: aim for 20 minutes per day walks, if that is too much for the dogs limit their walk to 10 minutes  NEW: try a youtube video for exercise  NEW: increase your water intake   Handouts Provided Include  N/A  Learning Style & Readiness for Change Teaching method utilized: Visual & Auditory  Demonstrated degree of understanding via: Teach Back  Readiness Level: action Barriers to learning/adherence to lifestyle change: none identified   RD's Notes for next Visit  Assess pts adherence to chosen goals   MONITORING & EVALUATION Dietary intake, weekly physical activity, body weight, and pre-op goals in 1 month.   Next Steps  Patient is to return to [facility] for next SWL

## 2021-07-12 ENCOUNTER — Other Ambulatory Visit: Payer: Self-pay

## 2021-07-12 ENCOUNTER — Encounter: Payer: Medicare (Managed Care) | Attending: Surgery | Admitting: Skilled Nursing Facility1

## 2021-07-12 DIAGNOSIS — I10 Essential (primary) hypertension: Secondary | ICD-10-CM | POA: Insufficient documentation

## 2021-07-12 DIAGNOSIS — Z713 Dietary counseling and surveillance: Secondary | ICD-10-CM | POA: Diagnosis not present

## 2021-07-12 DIAGNOSIS — Z6841 Body Mass Index (BMI) 40.0 and over, adult: Secondary | ICD-10-CM | POA: Diagnosis present

## 2021-07-12 DIAGNOSIS — K5792 Diverticulitis of intestine, part unspecified, without perforation or abscess without bleeding: Secondary | ICD-10-CM | POA: Insufficient documentation

## 2021-07-12 NOTE — Progress Notes (Signed)
Supervised Weight Loss Visit Bariatric Nutrition Education  3 out of 6 SWL Appointments   Referral stated Supervised Weight Loss (SWL) visits needed: 6  Planned surgery: Sleeve gastrectomy  Pt expectation of surgery: to lose weight Pt expectation of dietitian: none identified   Goals for surgery: reduce blood pressure medications, able to use stairs easier, increase mobility   NUTRITION ASSESSMENT   Anthropometrics  Start weight at [facility]: 294.8 lbs (date: 04/12/2021)  Weight: 294.8 pounds Height: 69 in BMI: 43.53 kg/m2     Clinical  Medical hx: diverticulitis, HTN Medications: see list Labs: A1C 6.4 Notable signs/symptoms: diverticulitis flare: pain in abdomen, knee pain, back pain  Any previous deficiencies? Potassium   Lifestyle & Dietary Hx  Pt states she has eliminated fried foods.   Pt states she believes she was able to lose weight by eating plant based proteins and more non starchy vegetables.  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   Pts increased A1C was discussed pt understands the diagnosis of diabetes.   Pt states [facility]   Estimated daily fluid intake: unknown oz Supplements:  Current average weekly physical activity: walking 7 days a Week 10-20 minutes   24-Hr Dietary Recall First Meal: egg bites + toast or cereal + stevia Snack: protein bar Second Meal: chicken salad + cherries or protein bar or half pimento cheese + half impossible burger Snack: cherries Third Meal: chicken legs or fish + broccoli + roll  or fish + spinach Snack:  Beverages: coffee + splenda + powder creamer, 4 ounces orange juice + water, water, unsweet tea  Estimated Energy Needs Calories: 1600   NUTRITION DIAGNOSIS  Overweight/obesity (-3.3) related to past poor dietary habits and physical inactivity as evidenced by patient w/ planned sleeve gastrectomy surgery following dietary  guidelines for continued weight loss.   NUTRITION INTERVENTION  Nutrition counseling (C-1) and education (E-2) to facilitate bariatric surgery goals.  Pre-Op Goals Progress & New Goals Track food and beverage intake (pen and paper, MyFitness Pal, Baritastic app, etc.): to help track GI issues  Avoid all sugar-sweetened beverages (ex: regular soda, sports drinks): try spledna for sweet tea Continue: reduce your orange juice to 4 ounces diluted with water continue: aim for 20 minutes per day walks, if that is too much for the dogs limit their walk to 10 minutes  NEW/continue: try a youtube video for exercise  continue: increase your water intake  NEW: increase your non starchy vegetables   Handouts Provided Include  N/A  Learning Style & Readiness for Change Teaching method utilized: Visual & Auditory  Demonstrated degree of understanding via: Teach Back  Readiness Level: action Barriers to learning/adherence to lifestyle change: none identified   RD's Notes for next Visit  Assess pts adherence to chosen goals   MONITORING & EVALUATION Dietary intake, weekly physical activity, body weight, and pre-op goals in 1 month.   Next Steps  Patient is to return to [facility] for next SWL

## 2021-08-09 ENCOUNTER — Encounter: Payer: Medicare (Managed Care) | Attending: Surgery | Admitting: Skilled Nursing Facility1

## 2021-08-09 ENCOUNTER — Other Ambulatory Visit: Payer: Self-pay

## 2021-08-09 DIAGNOSIS — K5792 Diverticulitis of intestine, part unspecified, without perforation or abscess without bleeding: Secondary | ICD-10-CM | POA: Insufficient documentation

## 2021-08-09 DIAGNOSIS — Z6841 Body Mass Index (BMI) 40.0 and over, adult: Secondary | ICD-10-CM | POA: Diagnosis present

## 2021-08-09 DIAGNOSIS — Z713 Dietary counseling and surveillance: Secondary | ICD-10-CM | POA: Diagnosis not present

## 2021-08-09 DIAGNOSIS — I10 Essential (primary) hypertension: Secondary | ICD-10-CM | POA: Insufficient documentation

## 2021-08-09 NOTE — Progress Notes (Signed)
Supervised Weight Loss Visit Bariatric Nutrition Education  4 out of 6 SWL Appointments   Referral stated Supervised Weight Loss (SWL) visits needed: 6  Planned surgery: Sleeve gastrectomy  Pt expectation of surgery: to lose weight Pt expectation of dietitian: none identified   Goals for surgery: reduce blood pressure medications, able to use stairs easier, increase mobility   NUTRITION ASSESSMENT   Anthropometrics  Start weight at [facility]: 294.8 lbs (date: 04/12/2021)  Weight: 291.5 pounds Height: 69 in BMI: 43.05 kg/m2     Clinical  Medical hx: diverticulitis, HTN Medications: see list Labs: A1C 6.4 Notable signs/symptoms: diverticulitis flare: pain in abdomen, knee pain, back pain  Any previous deficiencies? Potassium   Lifestyle & Dietary Hx  Pt states she has eliminated fried foods and still using stevia instead of sugar.   Pt state she started a lasix and will be going for a cardiac check up for swelling in her ankles.  Pt sates due to her mothers passing she has eaten some things she has been avoiding but never overindulged.    Estimated daily fluid intake: unknown oz Supplements:  Current average weekly physical activity: not since her mother passed away  24-Hr Dietary Recall First Meal: egg bites + toast or cereal + stevia or egg cheese canadian biscuit Snack: protein bar Second Meal: chicken salad + cherries or protein bar or half pimento cheese + half impossible burger or impossible burger + fries Snack: cherries Third Meal: chicken legs or fish + broccoli + roll  or fish + spinach or chicken + green  beans + mac n cheese Snack:  Beverages: coffee + splenda + powder creamer, 4 ounces orange juice + water, water, unsweet tea, diet soda  Estimated Energy Needs Calories: 1600   NUTRITION DIAGNOSIS  Overweight/obesity ([place]-3.3) related to past poor dietary habits and physical inactivity as evidenced by patient w/ planned sleeve gastrectomy surgery following  dietary guidelines for continued weight loss.   NUTRITION INTERVENTION  Nutrition counseling (C-1) and education (E-2) to facilitate bariatric surgery goals.  Pre-Op Goals Progress & New Goals Track food and beverage intake (pen and paper, MyFitness Pal, Baritastic app, etc.): to help track GI issues  Avoid all sugar-sweetened beverages (ex: regular soda, sports drinks): try spledna for sweet tea Continue: reduce your orange juice to 4 ounces diluted with water continue: aim for 20 minutes per day walks, if that is too much for the dogs limit their walk to 10 minutes  NEW/continue: try a youtube video for exercise  continue: increase your water intake  NEW/continue: increase your non starchy vegetables   Handouts Provided Include  N/A  Learning Style & Readiness for Change Teaching method utilized: Visual & Auditory  Demonstrated degree of understanding via: Teach Back  Readiness Level: action Barriers to learning/adherence to lifestyle change: none identified   RD's Notes for next Visit  Assess pts adherence to chosen goals   MONITORING & EVALUATION Dietary intake, weekly physical activity, body weight, and pre-op goals in 1 month.   Next Steps  Patient is to return to [facility] for next SWL

## 2021-09-18 ENCOUNTER — Encounter: Payer: Medicare (Managed Care) | Attending: Surgery | Admitting: Skilled Nursing Facility1

## 2021-09-18 ENCOUNTER — Other Ambulatory Visit: Payer: Self-pay

## 2021-09-18 DIAGNOSIS — Z713 Dietary counseling and surveillance: Secondary | ICD-10-CM | POA: Insufficient documentation

## 2021-09-18 DIAGNOSIS — Z6841 Body Mass Index (BMI) 40.0 and over, adult: Secondary | ICD-10-CM | POA: Diagnosis present

## 2021-09-18 NOTE — Progress Notes (Signed)
Supervised Weight Loss Visit Bariatric Nutrition Education  5 out of 6 SWL Appointments   Referral stated Supervised Weight Loss (SWL) visits needed: 6  Planned surgery: Sleeve gastrectomy  Pt expectation of surgery: to lose weight Pt expectation of dietitian: none identified   Goals for surgery: reduce blood pressure medications, able to use stairs easier, increase mobility   NUTRITION ASSESSMENT   Anthropometrics  Start weight at [facility]: 294.8 lbs (date: 04/12/2021)  Weight: 289.9 pounds Height: 69 in BMI: 42.81 kg/m2     Clinical  Medical hx: diverticulitis, HTN Medications: see list Labs: A1C 6.4 Notable signs/symptoms: diverticulitis flare: pain in abdomen, knee pain, back pain  Any previous deficiencies? Potassium   Lifestyle & Dietary Hx  Pt states she has eliminated fried foods and still using stevia instead of sugar.   Pt states she has been doing bedside exercises with a video stating she will start doing arm exercises. Pt state she has been really making an effort to eat more non starchy vegetables.  Pt states her cardiac check up went well and did not find any blockages or anything.  Pt state she has been noticing her portion sizes are getting smaller.    Estimated daily fluid intake: unknown oz Supplements:  Current average weekly physical activity: some bedside exercises   24-Hr Dietary Recall First Meal: some fruit + mini sausage biscuit Snack: protein bar Second Meal:pork loin steak + 1/2 cup green beans + scalloped potatoes  Snack: cherries Third Meal: broccoli + sweet potato + butter + brown sugar + cinnamon + chicken Snack:  Beverages: coffee + splenda + powder creamer, 4 ounces orange juice + water, water, unsweet tea, diet soda  Estimated Energy Needs Calories: 1600   NUTRITION DIAGNOSIS  Overweight/obesity ([place]-3.3) related to past poor dietary habits and physical inactivity as evidenced by patient w/ planned sleeve gastrectomy surgery  following dietary guidelines for continued weight loss.   NUTRITION INTERVENTION  Nutrition counseling (C-1) and education (E-2) to facilitate bariatric surgery goals.  Pre-Op Goals Progress & New Goals Track food and beverage intake (pen and paper, MyFitness Pal, Baritastic app, etc.): to help track GI issues  Avoid all sugar-sweetened beverages (ex: regular soda, sports drinks): try spledna for sweet tea Continue: reduce your orange juice to 4 ounces diluted with water continue: aim for 20 minutes per day walks, if that is too much for the dogs limit their walk to 10 minutes  continue: try a youtube video for exercise  continue: increase your water intake  continue: increase your non starchy vegetables  NEW: work on chewing until applesauce to help you to slow down when eating  Handouts Provided Include  N/A  Learning Style & Readiness for Change Teaching method utilized: Visual & Auditory  Demonstrated degree of understanding via: Teach Back  Readiness Level: action Barriers to learning/adherence to lifestyle change: none identified   RD's Notes for next Visit  Assess pts adherence to chosen goals   MONITORING & EVALUATION Dietary intake, weekly physical activity, body weight, and pre-op goals in 1 month.   Next Steps  Patient is to return to [facility] for next SWL

## 2021-10-17 ENCOUNTER — Other Ambulatory Visit: Payer: Self-pay

## 2021-10-17 ENCOUNTER — Encounter: Payer: Medicare (Managed Care) | Attending: Surgery | Admitting: Skilled Nursing Facility1

## 2021-10-17 DIAGNOSIS — Z713 Dietary counseling and surveillance: Secondary | ICD-10-CM | POA: Diagnosis not present

## 2021-10-17 DIAGNOSIS — Z6841 Body Mass Index (BMI) 40.0 and over, adult: Secondary | ICD-10-CM | POA: Insufficient documentation

## 2021-10-17 DIAGNOSIS — I10 Essential (primary) hypertension: Secondary | ICD-10-CM | POA: Insufficient documentation

## 2021-10-17 NOTE — Progress Notes (Signed)
Supervised Weight Loss Visit Bariatric Nutrition Education  6 out of 6 SWL Appointments   Referral stated Supervised Weight Loss (SWL) visits needed: 6  Planned surgery: Sleeve gastrectomy  Pt expectation of surgery: to lose weight Pt expectation of dietitian: none identified   Goals for surgery: reduce blood pressure medications, able to use stairs easier, increase mobility   NUTRITION ASSESSMENT   Anthropometrics  Start weight at [facility]: 294.8 lbs (date: 04/12/2021)  Weight: 293.2 pounds Height: 69 in BMI: 43.30 kg/m2     Clinical  Medical hx: diverticulitis, HTN Medications: see list Labs: A1C 6.4 Notable signs/symptoms: diverticulitis flare: pain in abdomen, knee pain, back pain  Any previous deficiencies? Potassium   Lifestyle & Dietary Hx  Pt arrives with limited mobility due to severe knee pain which most likely caused the weight gain and states she has to take food with her medicine.    Pt states in the last 6 months she has learned movement is essential to losing weight and maintain weight, learned she cannot drink sugary drinks due to the calories, learned to have smaller portions, eating often enough throughout the day.   Pt states after surgery she recognizes she needs to focus in on sweet cravings. Having learned she will stress eat when she is worrying about something so has been keeping healthier foods around. Pt states she has been making a conscious effort to chew well.   Estimated daily fluid intake: unknown oz Supplements:  Current average weekly physical activity: some bedside exercises   24-Hr Dietary Recall First Meal: some fruit + mini sausage biscuit or ham and egg biscuit  Snack: protein bar Second Meal: bologna sandwich + cauliflower rice + applesauce Snack: cherries Third Meal: broccoli + sweet potato + butter + brown sugar + cinnamon + chicken or chicken + caulifer + potatoes  Snack:  Beverages: coffee + splenda + powder creamer, 4 ounces  orange juice + water, water, unsweet tea, diet soda  Estimated Energy Needs Calories: 1600   NUTRITION DIAGNOSIS  Overweight/obesity ([place]-3.3) related to past poor dietary habits and physical inactivity as evidenced by patient w/ planned sleeve gastrectomy surgery following dietary guidelines for continued weight loss.   NUTRITION INTERVENTION  Nutrition counseling (C-1) and education (E-2) to facilitate bariatric surgery goals.  Pre-Op Goals Progress & New Goals Track food and beverage intake (pen and paper, MyFitness Pal, Baritastic app, etc.): to help track GI issues  Avoid all sugar-sweetened beverages (ex: regular soda, sports drinks): try spledna for sweet tea Continue: reduce your orange juice to 4 ounces diluted with water continue: aim for 20 minutes per day walks, if that is too much for the dogs limit their walk to 10 minutes  continue: try a youtube video for exercise  continue: increase your water intake  continue: increase your non starchy vegetables  continue: work on chewing until applesauce to help you to slow down when eating NEW: work on hobbies to do instead of worry eating  Handouts Provided Include  N/A  Learning Style & Readiness for Change Teaching method utilized: Patent attorney & Auditory  Demonstrated degree of understanding via: Teach Back  Readiness Level: action Barriers to learning/adherence to lifestyle change: none identified   RD's Notes for next Visit  Assess pts adherence to chosen goals   MONITORING & EVALUATION Dietary intake, weekly physical activity, body weight, and pre-op goals  Next Steps  Patient is to return to [facility] for pre-op class

## 2021-12-04 ENCOUNTER — Emergency Department (HOSPITAL_BASED_OUTPATIENT_CLINIC_OR_DEPARTMENT_OTHER)
Admission: EM | Admit: 2021-12-04 | Discharge: 2021-12-04 | Disposition: A | Discharge status: Home / Self Care | Payer: Medicare (Managed Care) | Attending: Emergency Medicine | Admitting: Emergency Medicine

## 2021-12-04 ENCOUNTER — Other Ambulatory Visit: Payer: Self-pay

## 2021-12-04 ENCOUNTER — Emergency Department (HOSPITAL_BASED_OUTPATIENT_CLINIC_OR_DEPARTMENT_OTHER): Payer: Medicare (Managed Care)

## 2021-12-04 ENCOUNTER — Encounter (HOSPITAL_BASED_OUTPATIENT_CLINIC_OR_DEPARTMENT_OTHER): Payer: Self-pay | Admitting: *Deleted

## 2021-12-04 DIAGNOSIS — Y92410 Unspecified street and highway as the place of occurrence of the external cause: Secondary | ICD-10-CM | POA: Diagnosis not present

## 2021-12-04 DIAGNOSIS — Z79899 Other long term (current) drug therapy: Secondary | ICD-10-CM | POA: Diagnosis not present

## 2021-12-04 DIAGNOSIS — Z87891 Personal history of nicotine dependence: Secondary | ICD-10-CM | POA: Diagnosis not present

## 2021-12-04 DIAGNOSIS — M542 Cervicalgia: Secondary | ICD-10-CM | POA: Diagnosis not present

## 2021-12-04 DIAGNOSIS — S39012A Strain of muscle, fascia and tendon of lower back, initial encounter: Secondary | ICD-10-CM | POA: Insufficient documentation

## 2021-12-04 DIAGNOSIS — I10 Essential (primary) hypertension: Secondary | ICD-10-CM | POA: Diagnosis not present

## 2021-12-04 DIAGNOSIS — M549 Dorsalgia, unspecified: Secondary | ICD-10-CM | POA: Diagnosis present

## 2021-12-04 MED ORDER — METHOCARBAMOL 500 MG PO TABS
500.0000 mg | ORAL_TABLET | Freq: Three times a day (TID) | ORAL | 0 refills | Status: DC | PRN
Start: 2021-12-04 — End: 2022-02-06
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
## 2021-12-04 NOTE — ED Notes (Signed)
ED Provider at bedside. 

## 2021-12-24 DIAGNOSIS — Z86718 Personal history of other venous thrombosis and embolism: Secondary | ICD-10-CM

## 2021-12-24 HISTORY — DX: Personal history of other venous thrombosis and embolism: Z86.718

## 2021-12-25 ENCOUNTER — Encounter: Payer: Medicare (Managed Care) | Attending: Surgery | Admitting: Skilled Nursing Facility1

## 2021-12-25 DIAGNOSIS — Z6841 Body Mass Index (BMI) 40.0 and over, adult: Secondary | ICD-10-CM | POA: Insufficient documentation

## 2021-12-26 ENCOUNTER — Other Ambulatory Visit: Payer: Self-pay

## 2021-12-26 NOTE — Progress Notes (Signed)
Pre-Operative Nutrition Class:    Patient was seen on 12/26/2021 for Pre-Operative Bariatric Surgery Education at [facility]    Surgery date:  Surgery type: sleeve Start weight at [facility]: 294.8 pounds Weight today: appt conducted virtually via webex: pt agreed to the limitations of this visit type; pt identified by name and DOB; conducted virtually in accordance with COVID protocols    The following the learning objectives were met by the patient during this course: Identify Pre-Op Dietary Goals and will begin 2 weeks pre-operatively Identify appropriate sources of fluids and proteins  State protein recommendations and appropriate sources pre and post-operatively Identify Post-Operative Dietary Goals and will follow for 2 weeks post-operatively Identify appropriate multivitamin and calcium sources Describe the need for physical activity post-operatively and will follow MD recommendations State when to call healthcare provider regarding medication questions or post-operative complications When having a diagnosis of diabetes understanding hypoglycemia symptoms and the inclusion of 1 complex carbohydrate per meal  Handouts given during class include: Pre-Op Bariatric Surgery Diet Handout Protein Shake Handout Post-Op Bariatric Surgery Nutrition Handout BELT Program Information Flyer Support Group Information Flyer [facility] Bariatric Supplements Price List  Follow-Up Plan: Patient will follow-up at [facility] 2 weeks post operatively for diet advancement per MD.

## 2022-01-04 ENCOUNTER — Ambulatory Visit: Payer: Self-pay | Admitting: Surgery

## 2022-01-05 NOTE — H&P (Signed)
Kristin Gibson °[id number]  °  °Referring Provider:  Self °  °  °Subjective  °  °Chief Complaint: pre-op bariatric surgery °  °  °  °History of Present Illness: °Returns for follow-up regarding surgical treatment of morbid obesity.  Denies any changes in Kristin Gibson health since our last visit which was nearly a year ago.  Kristin Gibson has completed the bariatric pathway with no barriers identified.  Kristin Gibson has several insightful questions to discuss today. °  °CXR/UGI 02/18/21: small hiatal hernia, no reflux °Dietician: approved, completed 6 months of supervised weight loss °  °02/03/21: Follows up to reestablish care for surgical management of morbid obesity.  Since our last visit 3 years ago, Kristin Gibson has been doing fairly well.  Kristin Gibson has moved into Kristin Gibson own apartment out of Kristin Gibson's home and is able to prepare Kristin Gibson own meals which makes it easier to stick with a certain diet plan.  Kristin Gibson has had a right knee replacement.  Kristin Gibson has had a couple of bouts of diverticulitis but has not had any surgical intervention regarding this.  Otherwise Kristin Gibson denies any significant changes in Kristin Gibson health and Kristin Gibson remains interested in sleeve gastrectomy. °Last CT scan was in July 2021 which did show some distal sigmoid diverticulitis without abscess or evidence of perforation. 293lb/ BMI 42 °  °02/12/18: 66-year-old woman who is here to discuss weight loss surgery.  Kristin Gibson was seen by Dr. White earlier this month and in August 2018 for surgical planning for a laparoscopic sigmoidectomy for recurrent bouts of non-complicated diverticulitis however as of the most recent visit, they have decided to pursue further observation as Kristin Gibson had not had an "attack" in the last 8 months.  Kristin Gibson has been followed by Kristin Gibson primary care doctor, Dr. Gill, at [facility] for weight loss attempts.  Over the years Kristin Gibson has tried numerous things with minimal success. °Previous attempts have included low-carb, Weight Watchers, Slim fast, appetite suppressant medication in the 80s,  daily walking although exercise limited due to knee pain.  Kristin Gibson is consistently been between 280 and 300 pounds for the last couple years.  With each attempt at weight loss Kristin Gibson will successfully lose up to 30 pounds but ultimately regains it. °Kristin Gibson has been started with Kristin Gibson weight all of Kristin Gibson life, but this was exacerbated by Kristin Gibson pregnancies, Kristin Gibson are now in Kristin Gibson late 30s.  Kristin Gibson wants to have weight loss surgery so that Kristin Gibson can be more active in Kristin Gibson life, engagement with Kristin Gibson family, and Kristin Gibson needs to have Kristin Gibson knees replaced which Kristin Gibson was told would require Kristin Gibson to lose weight. 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  Kristin Gibson states that the reflux is controlled on daily Protonix.  Kristin Gibson thinks Kristin Gibson may have been told Kristin Gibson had a hiatal hernia at some point in the past. °  °SurgHx: tubal ligation 1981 by a Pfannenstiel, vaginal hysterectomy 1993. Knee surgery arthroscopic, right, 3 times and breast reduction surgery. °  °FHx HTN, cancer °  °SocHx: occasional Etoh. Former smoker quit 1981.  Kristin Gibson family lives here in [place].  Kristin Gibson did live in [place] for about 10 years but moved back here a couple years ago.  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 °  °Allergies: sulfa abx, sulfur- reaction is vomiting. °  °Meds: Amlodipine, Benazepril, Clonidine, HCTZ, wellbutrin, celebrex, Bentyl, Protonix. °  °Review of Systems: °A complete review of systems was obtained   from the patient.  I have reviewed this information and discussed as appropriate with the patient.  See HPI as well for other ROS.     Medical History: Past Medical History      Past Medical History:  Diagnosis Date   Arthritis     Hypertension     Sleep apnea          There is no problem list on file for this patient.     Past Surgical History       Past Surgical History:  Procedure Laterality Date   breast reduction surgery N/A      right knee replacement surgery N/A          Allergies       Allergies  Allergen Reactions   Benazepril Swelling      Angioedema Angioedema     Other Itching and Nausea   Sulfa (Sulfonamide Antibiotics) Itching and Nausea   Sulfur (Not Sulfa) Nausea              Current Outpatient Medications on File Prior to Visit  Medication Sig Dispense Refill   amLODIPine (NORVASC) 10 MG tablet Take 1 tablet by mouth once daily       cloNIDine HCL (CATAPRES) 0.1 MG tablet Take 1 tablet by mouth 2 (two) times daily       FUROsemide (LASIX) 20 MG tablet TAKE 1 TABLET BY MOUTH DAILY AS NEEDED FOR LOWER EXTREMITY SWELLING       methocarbamoL (ROBAXIN) 500 MG tablet Take by mouth every 8 (eight) hours as needed       pantoprazole (PROTONIX) 40 MG DR tablet Take 1 tablet by mouth once daily       spironolactone (ALDACTONE) 50 MG tablet Take 1 tablet by mouth once daily       tiZANidine (ZANAFLEX) 2 MG tablet Take by mouth every 8 (eight) hours as needed       acetaminophen (TYLENOL) 650 MG ER tablet Take by mouth every 8 (eight) hours as needed        No current facility-administered medications on file prior to visit.      Family History  History reviewed. No pertinent family history.      Social History        Tobacco Use  Smoking Status Former   Types: Cigarettes  Smokeless Tobacco Never      Social History  Social History         Socioeconomic History   Marital status: Divorced  Tobacco Use   Smoking status: Former      Types: Cigarettes   Smokeless tobacco: Never  Vaping Use   Vaping Use: Never used  Substance and Sexual Activity   Alcohol use: Yes   Drug use: Never        Objective:         Vitals:    01/04/22 0952  BP: 132/80  Pulse: 102  Temp: 36.4 C (97.6 F)  SpO2: 99%  Weight: (!) 135 kg (297 lb 9.6 oz)  Height: 175.3 cm (5\' 9" )    Body mass index is 43.95 kg/m.   Alert and well-appearing Unlabored respirations   Assessment and Plan:   Diagnoses and all orders for this visit:   Morbid obesity (CMS-HCC)     MORBID OBESITY (E66.01) Story: Kristin Gibson remains a suitable candidate for sleeve gastrectomy. We discussed the technique of the surgery including the risks of bleeding, infection, pain, scarring, intra-abdominal injury, staple line leak, blood clots,  pneumonia, heart attack, stroke, death, incisional hernia, chronic nausea, weight regain, exacerbation of reflux etc. discussed frequent constipation after this operation and that Kristin Gibson will need to stay on top of this to avoid further exacerbation of Kristin Gibson diverticulosis. We discussed the typical one-night stay in the hospital and the typical postoperative course. I discussed with Kristin Gibson the importance of the input from the nutritionist and psychologist as if behavioral changes are not made after surgery, Kristin Gibson is at high risk of weight regain. Discussed that at Kristin Gibson age Kristin Gibson weight loss will be slower and less robust than average. Questions were welcomed and answered.  We will plan to proceed with surgery as scheduled. 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 Hoping to do the left after Kristin Gibson lost weight.  Having a lot more pain in the left knee this days. DIVERTICULITIS (K57.92) Story: Kristin Gibson has seen Dr. Cliffton AstersWhite in the past, has had a couple of episodes that have been mild and have not required intervention in the last few years.     Dalayah Deahl Carlye GrippeAMANDA Peityn Payton, MD

## 2022-01-23 DIAGNOSIS — M25562 Pain in left knee: Secondary | ICD-10-CM | POA: Diagnosis not present

## 2022-01-23 DIAGNOSIS — M21062 Valgus deformity, not elsewhere classified, left knee: Secondary | ICD-10-CM | POA: Diagnosis not present

## 2022-01-23 DIAGNOSIS — M1712 Unilateral primary osteoarthritis, left knee: Secondary | ICD-10-CM | POA: Diagnosis not present

## 2022-01-23 DIAGNOSIS — G8929 Other chronic pain: Secondary | ICD-10-CM | POA: Diagnosis not present

## 2022-02-02 DIAGNOSIS — R1032 Left lower quadrant pain: Secondary | ICD-10-CM | POA: Diagnosis not present

## 2022-02-02 DIAGNOSIS — K5909 Other constipation: Secondary | ICD-10-CM | POA: Diagnosis not present

## 2022-02-02 DIAGNOSIS — K219 Gastro-esophageal reflux disease without esophagitis: Secondary | ICD-10-CM | POA: Diagnosis not present

## 2022-02-02 DIAGNOSIS — K5790 Diverticulosis of intestine, part unspecified, without perforation or abscess without bleeding: Secondary | ICD-10-CM | POA: Diagnosis not present

## 2022-02-02 NOTE — Progress Notes (Addendum)
Anesthesia Review:  PCP: DR Sindy Messing- per pt supposed to see prior to surgery but has been unable to get an appt will keep in touch with office  Cardiologist : none  Chest x-ray : EKG : 02/06/22  Echo : Stress test: Cardiac Cath :  Activity level:  Sleep Study/ CPAP : has cpap to bringkmask and tubing  Fasting Blood Sugar :      / Checks Blood Sugar -- times a day:   Blood Thinner/ Instructions /Last Dose: ASA / Instructions/ Last Dose :   Prediabetes - doe snot hceck glucose at home  Hgba1c- 02/06/22- 6.0  Cvoid test on 02/15/22- 0900am  PT glucose at preop was 74.  Pt asymptomatic.  Pt given peanut butter crackers and diet shasta cola.  Pt to eat lunch once leaves preop appt.  Glucose upon recheck was 87.   PT instructed at preop appt to call pharmacy and number given to review meds.  Nurse also reviewed meds with pt at preop appt.

## 2022-02-02 NOTE — Progress Notes (Signed)
Coivd test on 02/15/22.   Come thru main entrance at [facility].  Have a seat in the lobby on the right as you come thru the door. Call [phone] and let them know you are here for covid testing.  .                 Your procedure is scheduled on:    02/19/22.    Report to [facility] Main  Entrance   Report to admitting at 0730am      Call this number if you have problems the morning of surgery [phone]    REMEMBER: NO  SOLID FOOD CANDY OR GUM AFTER MIDNIGHT. CLEAR LIQUIDS UNTIL    0645am        . NOTHING BY MOUTH EXCEPT CLEAR LIQUIDS UNTIL    . PLEASE FINISH  g2 lower sugar  DRINK PER SURGEON ORDER  WHICH NEEDS TO BE COMPLETED AT     0645am  .      CLEAR LIQUID DIET   Foods Allowed                                                                    Coffee and tea, regular and decaf                            Fruit ices (not with fruit pulp)                                      Iced Popsicles                                    Carbonated beverages, regular and diet                                    Cranberry, grape and apple juices Sports drinks like Gatorade Lightly seasoned clear broth or consume(fat free) Sugar, honey syrup ___________________________________________________________________      BRUSH YOUR TEETH MORNING OF SURGERY AND RINSE YOUR MOUTH OUT, NO CHEWING GUM CANDY OR MINTS.     Take these medicines the morning of surgery with A SIP OF WATER:  Amlodipine, clonidine, protonix  DO NOT TAKE ANY DIABETIC MEDICATIONS DAY OF YOUR SURGERY                               You may not have any metal on your body including hair pins and              piercings  Do not wear jewelry, make-up, lotions, powders or perfumes, deodorant             Do not wear nail polish on your fingernails.  Do not shave  48 hours prior to surgery.              Men may shave face and neck.   Do not bring valuables to the hospital. [place]  IS NOT             RESPONSIBLE    FOR VALUABLES.  Contacts, dentures or bridgework may not be worn into surgery.  Leave suitcase in the car. After surgery it may be brought to your room.     Patients discharged the day of surgery will not be allowed to drive home. IF YOU ARE HAVING SURGERY AND GOING HOME THE SAME DAY, YOU MUST HAVE AN ADULT TO DRIVE YOU HOME AND BE WITH YOU FOR 24 HOURS. YOU MAY GO HOME BY TAXI OR UBER OR ORTHERWISE, BUT AN ADULT MUST ACCOMPANY YOU HOME AND STAY WITH YOU FOR 24 HOURS.  Name and phone number of your driver:  Special Instructions: N/A              Please read over the following fact sheets you were given: _____________________________________________________________________  [facility] - Preparing for Surgery Before surgery, you can play an important role.  Because skin is not sterile, your skin needs to be as free of germs as possible.  You can reduce the number of germs on your skin by washing with CHG (chlorahexidine gluconate) soap before surgery.  CHG is an antiseptic cleaner which kills germs and bonds with the skin to continue killing germs even after washing. Please DO NOT use if you have an allergy to CHG or antibacterial soaps.  If your skin becomes reddened/irritated stop using the CHG and inform your nurse when you arrive at Short Stay. Do not shave (including legs and underarms) for at least 48 hours prior to the first CHG shower.  You may shave your face/neck. Please follow these instructions carefully:  1.  Shower with CHG Soap the night before surgery and the  morning of Surgery.  2.  If you choose to wash your hair, wash your hair first as usual with your  normal  shampoo.  3.  After you shampoo, rinse your hair and body thoroughly to remove the  shampoo.                           4.  Use CHG as you would any other liquid soap.  You can apply chg directly  to the skin and wash                       Gently with a scrungie or clean washcloth.  5.  Apply the CHG Soap to your body  ONLY FROM THE NECK DOWN.   Do not use on face/ open                           Wound or open sores. Avoid contact with eyes, ears mouth and genitals (private parts).                       Wash face,  Genitals (private parts) with your normal soap.             6.  Wash thoroughly, paying special attention to the area where your surgery  will be performed.  7.  Thoroughly rinse your body with warm water from the neck down.  8.  DO NOT shower/wash with your normal soap after using and rinsing off  the CHG Soap.                9.  Pat yourself dry with a  clean towel.            10.  Wear clean pajamas.            11.  Place clean sheets on your bed the night of your first shower and do not  sleep with pets. Day of Surgery : Do not apply any lotions/deodorants the morning of surgery.  Please wear clean clothes to the hospital/surgery center.  FAILURE TO FOLLOW THESE INSTRUCTIONS MAY RESULT IN THE CANCELLATION OF YOUR SURGERY PATIENT SIGNATURE_________________________________  NURSE SIGNATURE__________________________________  ________________________________________________________________________

## 2022-02-06 ENCOUNTER — Other Ambulatory Visit: Payer: Self-pay

## 2022-02-06 ENCOUNTER — Encounter (HOSPITAL_COMMUNITY): Payer: Self-pay

## 2022-02-06 ENCOUNTER — Encounter (HOSPITAL_COMMUNITY)
Admission: RE | Admit: 2022-02-06 | Discharge: 2022-02-06 | Disposition: A | Discharge status: Home / Self Care | Payer: Medicare HMO | Source: Ambulatory Visit | Attending: Surgery | Admitting: Surgery

## 2022-02-06 VITALS — BP 164/86 | HR 54 | Temp 98.4°F | Resp 16 | Ht 69.0 in | Wt 297.0 lb

## 2022-02-06 DIAGNOSIS — R7303 Prediabetes: Secondary | ICD-10-CM

## 2022-02-06 DIAGNOSIS — Z01818 Encounter for other preprocedural examination: Secondary | ICD-10-CM | POA: Insufficient documentation

## 2022-02-06 HISTORY — DX: Gastro-esophageal reflux disease without esophagitis: K21.9

## 2022-02-06 HISTORY — DX: Sleep apnea, unspecified: G47.30

## 2022-02-06 HISTORY — DX: Nausea with vomiting, unspecified: R11.2

## 2022-02-06 HISTORY — DX: Other specified postprocedural states: Z98.890

## 2022-02-06 HISTORY — DX: Prediabetes: R73.03

## 2022-02-06 HISTORY — DX: Other specified postprocedural states: R11.2

## 2022-02-06 LAB — COMPREHENSIVE METABOLIC PANEL
ALT: 17 U/L (ref 0–44)
AST: 16 U/L (ref 15–41)
Albumin: 4 g/dL (ref 3.5–5.0)
Alkaline Phosphatase: 53 U/L (ref 38–126)
Anion gap: 8 (ref 5–15)
BUN: 18 mg/dL (ref 8–23)
CO2: 25 mmol/L (ref 22–32)
Calcium: 9 mg/dL (ref 8.9–10.3)
Chloride: 106 mmol/L (ref 98–111)
Creatinine, Ser: 0.69 mg/dL (ref 0.44–1.00)
GFR, Estimated: >60 mL/min (ref >60)
Glucose, Bld: 85 mg/dL (ref 70–99)
Potassium: 4 mmol/L (ref 3.5–5.1)
Sodium: 139 mmol/L (ref 135–145)
Total Bilirubin: 0.2 mg/dL — ABNORMAL LOW (ref 0.3–1.2)
Total Protein: 7.7 g/dL (ref 6.5–8.1)

## 2022-02-06 LAB — TYPE AND SCREEN
ABO/RH(D): O POS
Antibody Screen: NEGATIVE

## 2022-02-06 LAB — CBC WITH DIFFERENTIAL/PLATELET
Abs Immature Granulocytes: 0.02 10*3/uL (ref 0.00–0.07)
Basophils Absolute: 0 10*3/uL (ref 0.0–0.1)
Basophils Relative: 1 %
Eosinophils Absolute: 0.1 10*3/uL (ref 0.0–0.5)
Eosinophils Relative: 1 %
HCT: 42.3 % (ref 36.0–46.0)
Hemoglobin: 13.3 g/dL (ref 12.0–15.0)
Immature Granulocytes: 0 %
Lymphocytes Relative: 42 %
Lymphs Abs: 2.4 10*3/uL (ref 0.7–4.0)
MCH: 29.4 pg (ref 26.0–34.0)
MCHC: 31.4 g/dL (ref 30.0–36.0)
MCV: 93.6 fL (ref 80.0–100.0)
Monocytes Absolute: 0.5 10*3/uL (ref 0.1–1.0)
Monocytes Relative: 9 %
Neutro Abs: 2.6 10*3/uL (ref 1.7–7.7)
Neutrophils Relative %: 47 %
Platelets: 216 10*3/uL (ref 150–400)
RBC: 4.52 MIL/uL (ref 3.87–5.11)
RDW: 14.7 % (ref 11.5–15.5)
WBC: 5.6 10*3/uL (ref 4.0–10.5)
nRBC: 0 % (ref 0.0–0.2)

## 2022-02-06 LAB — HEMOGLOBIN A1C
Hgb A1c MFr Bld: 6 % — ABNORMAL HIGH (ref 4.8–5.6)
Mean Plasma Glucose: 125.5 mg/dL

## 2022-02-06 LAB — GLUCOSE, CAPILLARY
Glucose-Capillary: 74 mg/dL (ref 70–99)
Glucose-Capillary: 87 mg/dL (ref 70–99)

## 2022-02-08 DIAGNOSIS — I10 Essential (primary) hypertension: Secondary | ICD-10-CM | POA: Diagnosis not present

## 2022-02-08 DIAGNOSIS — Z01818 Encounter for other preprocedural examination: Secondary | ICD-10-CM | POA: Diagnosis not present

## 2022-02-08 DIAGNOSIS — Z6841 Body Mass Index (BMI) 40.0 and over, adult: Secondary | ICD-10-CM | POA: Diagnosis not present

## 2022-02-15 ENCOUNTER — Encounter (HOSPITAL_COMMUNITY)
Admission: RE | Admit: 2022-02-15 | Discharge: 2022-02-15 | Disposition: A | Discharge status: Home / Self Care | Payer: Medicare HMO | Source: Ambulatory Visit | Attending: Surgery | Admitting: Surgery

## 2022-02-15 ENCOUNTER — Other Ambulatory Visit: Payer: Self-pay

## 2022-02-15 DIAGNOSIS — Z01818 Encounter for other preprocedural examination: Secondary | ICD-10-CM

## 2022-02-15 DIAGNOSIS — Z01812 Encounter for preprocedural laboratory examination: Secondary | ICD-10-CM | POA: Insufficient documentation

## 2022-02-15 DIAGNOSIS — Z20822 Contact with and (suspected) exposure to covid-19: Secondary | ICD-10-CM | POA: Diagnosis not present

## 2022-02-15 LAB — SARS CORONAVIRUS 2 (TAT 6-24 HRS): SARS Coronavirus 2: NEGATIVE

## 2022-02-19 ENCOUNTER — Inpatient Hospital Stay (HOSPITAL_COMMUNITY)
Admission: RE | Admit: 2022-02-19 | Discharge: 2022-02-20 | DRG: 621 | Disposition: A | Discharge status: Home / Self Care | Payer: Medicare HMO | Source: Ambulatory Visit | Attending: Surgery | Admitting: Surgery

## 2022-02-19 ENCOUNTER — Inpatient Hospital Stay (HOSPITAL_COMMUNITY): Payer: Medicare HMO | Admitting: Physician Assistant

## 2022-02-19 ENCOUNTER — Other Ambulatory Visit: Payer: Self-pay

## 2022-02-19 ENCOUNTER — Encounter (HOSPITAL_COMMUNITY): Payer: Self-pay | Admitting: Surgery

## 2022-02-19 ENCOUNTER — Encounter (HOSPITAL_COMMUNITY)
Admission: RE | Disposition: A | Discharge status: Home / Self Care | Payer: Self-pay | Source: Ambulatory Visit | Attending: Surgery

## 2022-02-19 ENCOUNTER — Inpatient Hospital Stay (HOSPITAL_COMMUNITY): Payer: Medicare HMO | Admitting: Anesthesiology

## 2022-02-19 DIAGNOSIS — Z882 Allergy status to sulfonamides status: Secondary | ICD-10-CM

## 2022-02-19 DIAGNOSIS — Z6841 Body Mass Index (BMI) 40.0 and over, adult: Secondary | ICD-10-CM

## 2022-02-19 DIAGNOSIS — Z96651 Presence of right artificial knee joint: Secondary | ICD-10-CM | POA: Diagnosis not present

## 2022-02-19 DIAGNOSIS — Z87891 Personal history of nicotine dependence: Secondary | ICD-10-CM | POA: Diagnosis not present

## 2022-02-19 DIAGNOSIS — Z8249 Family history of ischemic heart disease and other diseases of the circulatory system: Secondary | ICD-10-CM | POA: Diagnosis not present

## 2022-02-19 DIAGNOSIS — G4733 Obstructive sleep apnea (adult) (pediatric): Secondary | ICD-10-CM | POA: Diagnosis present

## 2022-02-19 DIAGNOSIS — K449 Diaphragmatic hernia without obstruction or gangrene: Secondary | ICD-10-CM | POA: Diagnosis present

## 2022-02-19 DIAGNOSIS — M1712 Unilateral primary osteoarthritis, left knee: Secondary | ICD-10-CM | POA: Diagnosis not present

## 2022-02-19 DIAGNOSIS — Z888 Allergy status to other drugs, medicaments and biological substances status: Secondary | ICD-10-CM

## 2022-02-19 DIAGNOSIS — F329 Major depressive disorder, single episode, unspecified: Secondary | ICD-10-CM | POA: Diagnosis not present

## 2022-02-19 DIAGNOSIS — M1711 Unilateral primary osteoarthritis, right knee: Secondary | ICD-10-CM | POA: Diagnosis not present

## 2022-02-19 DIAGNOSIS — I10 Essential (primary) hypertension: Secondary | ICD-10-CM | POA: Diagnosis not present

## 2022-02-19 DIAGNOSIS — K219 Gastro-esophageal reflux disease without esophagitis: Secondary | ICD-10-CM | POA: Diagnosis present

## 2022-02-19 HISTORY — PX: LAPAROSCOPIC GASTRIC SLEEVE RESECTION: SHX5895

## 2022-02-19 HISTORY — PX: UPPER GI ENDOSCOPY: SHX6162

## 2022-02-19 LAB — GLUCOSE, CAPILLARY: Glucose-Capillary: 86 mg/dL (ref 70–99)

## 2022-02-19 SURGERY — GASTRECTOMY, SLEEVE, LAPAROSCOPIC
Anesthesia: General | Site: Abdomen

## 2022-02-19 MED ORDER — SUGAMMADEX SODIUM 500 MG/5ML IV SOLN
INTRAVENOUS | Status: AC
  Filled 2022-02-19: qty 5

## 2022-02-19 MED ORDER — ONDANSETRON HCL 4 MG/2ML IJ SOLN: 4.0000 mg | INTRAMUSCULAR | Status: DC | PRN

## 2022-02-19 MED ORDER — DOCUSATE SODIUM 100 MG PO CAPS
100.0000 mg | ORAL_CAPSULE | Freq: Two times a day (BID) | ORAL | Status: DC
  Administered 2022-02-19 – 2022-02-20 (×2): 100 mg via ORAL
  Filled 2022-02-19 (×2): qty 1

## 2022-02-19 MED ORDER — BUPIVACAINE-EPINEPHRINE (PF) 0.25% -1:200000 IJ SOLN
INTRAMUSCULAR | Status: AC
  Filled 2022-02-19: qty 30

## 2022-02-19 MED ORDER — BUPIVACAINE LIPOSOME 1.3 % IJ SUSP
INTRAMUSCULAR | Status: AC
  Filled 2022-02-19: qty 20

## 2022-02-19 MED ORDER — PANTOPRAZOLE SODIUM 40 MG IV SOLR
40.0000 mg | Freq: Every day | INTRAVENOUS | Status: DC
  Administered 2022-02-19: 40 mg via INTRAVENOUS
  Filled 2022-02-19: qty 10

## 2022-02-19 MED ORDER — KETAMINE HCL 50 MG/5ML IJ SOSY
PREFILLED_SYRINGE | INTRAMUSCULAR | Status: AC
  Filled 2022-02-19: qty 5

## 2022-02-19 MED ORDER — 0.9 % SODIUM CHLORIDE (POUR BTL) OPTIME
TOPICAL | Status: DC | PRN
  Administered 2022-02-19: 1000 mL

## 2022-02-19 MED ORDER — CHLORHEXIDINE GLUCONATE 0.12 % MT SOLN
15.0000 mL | Freq: Once | OROMUCOSAL | Status: AC
  Administered 2022-02-19: 15 mL via OROMUCOSAL

## 2022-02-19 MED ORDER — ONDANSETRON HCL 4 MG/2ML IJ SOLN: 4.0000 mg | Freq: Once | INTRAMUSCULAR | Status: DC | PRN

## 2022-02-19 MED ORDER — DEXAMETHASONE SODIUM PHOSPHATE 4 MG/ML IJ SOLN
INTRAMUSCULAR | Status: DC | PRN
Start: 2022-02-19 — End: 2022-02-19
  Administered 2022-02-19: 10 mg via INTRAVENOUS

## 2022-02-19 MED ORDER — FENTANYL CITRATE (PF) 250 MCG/5ML IJ SOLN
INTRAMUSCULAR | Status: AC
  Filled 2022-02-19: qty 5

## 2022-02-19 MED ORDER — LIDOCAINE HCL (PF) 2 % IJ SOLN
INTRAMUSCULAR | Status: DC | PRN
  Administered 2022-02-19: 1.5 mg/kg/h via INTRADERMAL

## 2022-02-19 MED ORDER — AMISULPRIDE (ANTIEMETIC) 5 MG/2ML IV SOLN: 10.0000 mg | Freq: Once | INTRAVENOUS | Status: DC | PRN

## 2022-02-19 MED ORDER — HEPARIN SODIUM (PORCINE) 5000 UNIT/ML IJ SOLN
5000.0000 [IU] | INTRAMUSCULAR | Status: AC
  Administered 2022-02-19: 5000 [IU] via SUBCUTANEOUS
  Filled 2022-02-19: qty 1

## 2022-02-19 MED ORDER — METHOCARBAMOL 500 MG IVPB - SIMPLE MED
500.0000 mg | Freq: Four times a day (QID) | INTRAVENOUS | Status: DC | PRN
  Filled 2022-02-19: qty 50

## 2022-02-19 MED ORDER — LACTATED RINGERS IV SOLN: INTRAVENOUS | Status: DC

## 2022-02-19 MED ORDER — BUPIVACAINE LIPOSOME 1.3 % IJ SUSP
INTRAMUSCULAR | Status: DC | PRN
  Administered 2022-02-19: 20 mL

## 2022-02-19 MED ORDER — FENTANYL CITRATE (PF) 100 MCG/2ML IJ SOLN
INTRAMUSCULAR | Status: DC | PRN
  Administered 2022-02-19 (×2): 50 ug via INTRAVENOUS

## 2022-02-19 MED ORDER — ENOXAPARIN SODIUM 30 MG/0.3ML IJ SOSY
30.0000 mg | PREFILLED_SYRINGE | Freq: Two times a day (BID) | INTRAMUSCULAR | Status: DC
  Administered 2022-02-19 – 2022-02-20 (×2): 30 mg via SUBCUTANEOUS
  Filled 2022-02-19 (×2): qty 0.3

## 2022-02-19 MED ORDER — GABAPENTIN 100 MG PO CAPS
200.0000 mg | ORAL_CAPSULE | Freq: Two times a day (BID) | ORAL | Status: DC
  Administered 2022-02-19 – 2022-02-20 (×2): 200 mg via ORAL
  Filled 2022-02-19 (×2): qty 2

## 2022-02-19 MED ORDER — ROCURONIUM BROMIDE 10 MG/ML (PF) SYRINGE
PREFILLED_SYRINGE | INTRAVENOUS | Status: AC
  Filled 2022-02-19: qty 10

## 2022-02-19 MED ORDER — ACETAMINOPHEN 500 MG PO TABS: 1000.0000 mg | ORAL_TABLET | Freq: Once | ORAL | Status: DC

## 2022-02-19 MED ORDER — PROPOFOL 10 MG/ML IV BOLUS
INTRAVENOUS | Status: AC
  Filled 2022-02-19: qty 20

## 2022-02-19 MED ORDER — OXYCODONE HCL 5 MG PO TABS: 5.0000 mg | ORAL_TABLET | Freq: Once | ORAL | Status: DC | PRN

## 2022-02-19 MED ORDER — STERILE WATER FOR IRRIGATION IR SOLN
Status: DC | PRN
Start: 2022-02-19 — End: 2022-02-19
  Administered 2022-02-19: 1000 mL
  Administered 2022-02-19: 500 mL

## 2022-02-19 MED ORDER — DEXAMETHASONE SODIUM PHOSPHATE 10 MG/ML IJ SOLN
INTRAMUSCULAR | Status: AC
  Filled 2022-02-19: qty 1

## 2022-02-19 MED ORDER — SODIUM CHLORIDE 0.9 % IV SOLN
2.0000 g | INTRAVENOUS | Status: AC
  Administered 2022-02-19: 2 g via INTRAVENOUS
  Filled 2022-02-19: qty 2

## 2022-02-19 MED ORDER — PROPOFOL 10 MG/ML IV BOLUS
INTRAVENOUS | Status: DC | PRN
  Administered 2022-02-19: 200 mg via INTRAVENOUS

## 2022-02-19 MED ORDER — HYDROMORPHONE HCL 1 MG/ML IJ SOLN
0.5000 mg | INTRAMUSCULAR | Status: DC | PRN
  Administered 2022-02-19: 0.5 mg via INTRAVENOUS
  Filled 2022-02-19: qty 0.5

## 2022-02-19 MED ORDER — ENSURE MAX PROTEIN PO LIQD
2.0000 [oz_av] | ORAL | Status: DC
  Administered 2022-02-20 (×4): 2 [oz_av] via ORAL

## 2022-02-19 MED ORDER — CHLORHEXIDINE GLUCONATE 4 % EX LIQD: 60.0000 mL | Freq: Once | CUTANEOUS | Status: DC

## 2022-02-19 MED ORDER — BUPIVACAINE-EPINEPHRINE 0.25% -1:200000 IJ SOLN
INTRAMUSCULAR | Status: DC | PRN
  Administered 2022-02-19: 30 mL

## 2022-02-19 MED ORDER — HYDRALAZINE HCL 20 MG/ML IJ SOLN: 10.0000 mg | INTRAMUSCULAR | Status: DC | PRN

## 2022-02-19 MED ORDER — ACETAMINOPHEN 500 MG PO TABS
1000.0000 mg | ORAL_TABLET | Freq: Three times a day (TID) | ORAL | Status: DC
  Administered 2022-02-19 – 2022-02-20 (×2): 1000 mg via ORAL
  Filled 2022-02-19 (×2): qty 2

## 2022-02-19 MED ORDER — SUGAMMADEX SODIUM 500 MG/5ML IV SOLN
INTRAVENOUS | Status: DC | PRN
Start: 2022-02-19 — End: 2022-02-19
  Administered 2022-02-19: 500 mg via INTRAVENOUS

## 2022-02-19 MED ORDER — LACTATED RINGERS IR SOLN
Status: DC | PRN
  Administered 2022-02-19: 1000 mL

## 2022-02-19 MED ORDER — MIDAZOLAM HCL 2 MG/2ML IJ SOLN
INTRAMUSCULAR | Status: AC
  Filled 2022-02-19: qty 2

## 2022-02-19 MED ORDER — TRAMADOL HCL 50 MG PO TABS
50.0000 mg | ORAL_TABLET | Freq: Four times a day (QID) | ORAL | Status: DC | PRN
  Administered 2022-02-20: 50 mg via ORAL
  Filled 2022-02-19 (×3): qty 1

## 2022-02-19 MED ORDER — BISACODYL 10 MG RE SUPP: 10.0000 mg | Freq: Every day | RECTAL | Status: DC | PRN

## 2022-02-19 MED ORDER — AMLODIPINE BESYLATE 10 MG PO TABS
10.0000 mg | ORAL_TABLET | Freq: Every day | ORAL | Status: DC
Start: 2022-02-19 — End: 2022-02-20

## 2022-02-19 MED ORDER — MEPERIDINE HCL 50 MG/ML IJ SOLN: 6.2500 mg | INTRAMUSCULAR | Status: DC | PRN

## 2022-02-19 MED ORDER — ONDANSETRON HCL 4 MG/2ML IJ SOLN
INTRAMUSCULAR | Status: AC
  Filled 2022-02-19: qty 2

## 2022-02-19 MED ORDER — CLONIDINE HCL 0.1 MG PO TABS: 0.1000 mg | ORAL_TABLET | Freq: Every day | ORAL | Status: DC

## 2022-02-19 MED ORDER — HYDROMORPHONE HCL 1 MG/ML IJ SOLN: 0.2500 mg | INTRAMUSCULAR | Status: DC | PRN

## 2022-02-19 MED ORDER — SCOPOLAMINE 1 MG/3DAYS TD PT72
MEDICATED_PATCH | TRANSDERMAL | Status: AC
  Filled 2022-02-19: qty 1

## 2022-02-19 MED ORDER — SCOPOLAMINE 1 MG/3DAYS TD PT72
MEDICATED_PATCH | TRANSDERMAL | Status: DC | PRN
  Administered 2022-02-19: 1 via TRANSDERMAL

## 2022-02-19 MED ORDER — EPHEDRINE SULFATE (PRESSORS) 50 MG/ML IJ SOLN
INTRAMUSCULAR | Status: DC | PRN
  Administered 2022-02-19: 5 mg via INTRAVENOUS
  Administered 2022-02-19: 10 mg via INTRAVENOUS
  Administered 2022-02-19 (×2): 5 mg via INTRAVENOUS

## 2022-02-19 MED ORDER — OXYCODONE HCL 5 MG/5ML PO SOLN: 5.0000 mg | Freq: Once | ORAL | Status: DC | PRN

## 2022-02-19 MED ORDER — SODIUM CHLORIDE 0.9 % IV SOLN: INTRAVENOUS | Status: DC

## 2022-02-19 MED ORDER — LIDOCAINE 2% (20 MG/ML) 5 ML SYRINGE
INTRAMUSCULAR | Status: DC | PRN
  Administered 2022-02-19: 60 mg via INTRAVENOUS

## 2022-02-19 MED ORDER — OXYCODONE HCL 5 MG/5ML PO SOLN
5.0000 mg | Freq: Four times a day (QID) | ORAL | Status: DC | PRN
  Administered 2022-02-19 – 2022-02-20 (×2): 5 mg via ORAL
  Filled 2022-02-19 (×2): qty 5

## 2022-02-19 MED ORDER — ROCURONIUM BROMIDE 10 MG/ML (PF) SYRINGE
PREFILLED_SYRINGE | INTRAVENOUS | Status: DC | PRN
Start: 2022-02-19 — End: 2022-02-19
  Administered 2022-02-19: 100 mg via INTRAVENOUS

## 2022-02-19 MED ORDER — ONDANSETRON HCL 4 MG/2ML IJ SOLN
INTRAMUSCULAR | Status: DC | PRN
  Administered 2022-02-19: 4 mg via INTRAVENOUS

## 2022-02-19 MED ORDER — EPHEDRINE 5 MG/ML INJ
INTRAVENOUS | Status: AC
  Filled 2022-02-19: qty 5

## 2022-02-19 MED ORDER — SIMETHICONE 80 MG PO CHEW
80.0000 mg | CHEWABLE_TABLET | Freq: Four times a day (QID) | ORAL | Status: DC | PRN
Start: 2022-02-19 — End: 2022-02-20
  Administered 2022-02-19 – 2022-02-20 (×3): 80 mg via ORAL
  Filled 2022-02-19 (×3): qty 1

## 2022-02-19 MED ORDER — LIDOCAINE HCL 2 % IJ SOLN
INTRAMUSCULAR | Status: AC
  Filled 2022-02-19: qty 20

## 2022-02-19 MED ORDER — ACETAMINOPHEN 500 MG PO TABS
1000.0000 mg | ORAL_TABLET | ORAL | Status: AC
  Administered 2022-02-19: 1000 mg via ORAL
  Filled 2022-02-19: qty 2

## 2022-02-19 MED ORDER — ACETAMINOPHEN 160 MG/5ML PO SOLN
1000.0000 mg | Freq: Three times a day (TID) | ORAL | Status: DC
  Administered 2022-02-20: 1000 mg via ORAL
  Filled 2022-02-19: qty 40.6

## 2022-02-19 MED ORDER — MIDAZOLAM HCL 5 MG/5ML IJ SOLN
INTRAMUSCULAR | Status: DC | PRN
Start: 2022-02-19 — End: 2022-02-19
  Administered 2022-02-19 (×2): 1 mg via INTRAVENOUS

## 2022-02-19 MED ORDER — PROPOFOL 500 MG/50ML IV EMUL
INTRAVENOUS | Status: DC | PRN
  Administered 2022-02-19: 20 ug/kg/min via INTRAVENOUS

## 2022-02-19 MED ORDER — APREPITANT 40 MG PO CAPS
40.0000 mg | ORAL_CAPSULE | ORAL | Status: AC
  Administered 2022-02-19: 40 mg via ORAL
  Filled 2022-02-19: qty 1

## 2022-02-19 MED ORDER — METOCLOPRAMIDE HCL 5 MG/ML IJ SOLN
10.0000 mg | Freq: Four times a day (QID) | INTRAMUSCULAR | Status: DC
  Administered 2022-02-19 – 2022-02-20 (×5): 10 mg via INTRAVENOUS
  Filled 2022-02-19 (×5): qty 2

## 2022-02-19 SURGICAL SUPPLY — 78 items
APL PRP STRL LF DISP 70% ISPRP (MISCELLANEOUS) ×4
APL SKNCLS STERI-STRIP NONHPOA (GAUZE/BANDAGES/DRESSINGS) ×2
APPLIER CLIP ROT 10 11.4 M/L (STAPLE)
APPLIER CLIP ROT 13.4 12 LRG (CLIP)
APR CLP LRG 13.4X12 ROT 20 MLT (CLIP)
APR CLP MED LRG 11.4X10 (STAPLE)
BAG COUNTER SPONGE SURGICOUNT (BAG) IMPLANT
BAG LAPAROSCOPIC 12 15 PORT 16 (BASKET) IMPLANT
BAG RETRIEVAL 12/15 (BASKET)
BAG SPNG CNTER NS LX DISP (BAG)
BENZOIN TINCTURE PRP APPL 2/3 (GAUZE/BANDAGES/DRESSINGS) ×3 IMPLANT
BLADE SURG SZ11 CARB STEEL (BLADE) ×3 IMPLANT
BNDG ADH 1X3 SHEER STRL LF (GAUZE/BANDAGES/DRESSINGS) ×13 IMPLANT
BNDG ADH THN 3X1 STRL LF (GAUZE/BANDAGES/DRESSINGS) ×2
CABLE HIGH FREQUENCY MONO STRZ (ELECTRODE) ×2 IMPLANT
CHLORAPREP W/TINT 26 (MISCELLANEOUS) ×6 IMPLANT
CLIP APPLIE ROT 10 11.4 M/L (STAPLE) IMPLANT
CLIP APPLIE ROT 13.4 12 LRG (CLIP) IMPLANT
CLSR STERI-STRIP ANTIMIC 1/2X4 (GAUZE/BANDAGES/DRESSINGS) ×1 IMPLANT
COVER SURGICAL LIGHT HANDLE (MISCELLANEOUS) ×3 IMPLANT
DEVICE SUT QUICK LOAD TK 5 (STAPLE) ×1 IMPLANT
DEVICE SUT TI-KNOT TK 5X26 (MISCELLANEOUS) ×1 IMPLANT
DEVICE SUTURE ENDOST 10MM (ENDOMECHANICALS) ×1 IMPLANT
DRAPE UTILITY XL STRL (DRAPES) ×6 IMPLANT
ELECT REM PT RETURN 15FT ADLT (MISCELLANEOUS) ×3 IMPLANT
GAUZE SPONGE 4X4 12PLY STRL (GAUZE/BANDAGES/DRESSINGS) IMPLANT
GLOVE SURG ENC MOIS LTX SZ6 (GLOVE) ×3 IMPLANT
GLOVE SURG MICRO LTX SZ6 (GLOVE) ×3 IMPLANT
GLOVE SURG UNDER LTX SZ6.5 (GLOVE) ×3 IMPLANT
GOWN STRL REUS W/TWL LRG LVL3 (GOWN DISPOSABLE) ×3 IMPLANT
GOWN STRL REUS W/TWL XL LVL3 (GOWN DISPOSABLE) ×6 IMPLANT
GRASPER SUT TROCAR 14GX15 (MISCELLANEOUS) ×3 IMPLANT
IRRIG SUCT STRYKERFLOW 2 WTIP (MISCELLANEOUS) ×3
IRRIGATION SUCT STRKRFLW 2 WTP (MISCELLANEOUS) ×2 IMPLANT
KIT BASIN OR (CUSTOM PROCEDURE TRAY) ×3 IMPLANT
KIT TURNOVER KIT A (KITS) IMPLANT
MARKER SKIN DUAL TIP RULER LAB (MISCELLANEOUS) ×3 IMPLANT
MAT PREVALON FULL STRYKER (MISCELLANEOUS) ×3 IMPLANT
NDL SPNL 22GX3.5 QUINCKE BK (NEEDLE) ×2 IMPLANT
NEEDLE SPNL 22GX3.5 QUINCKE BK (NEEDLE) ×3 IMPLANT
PACK UNIVERSAL I (CUSTOM PROCEDURE TRAY) ×3 IMPLANT
RELOAD ENDO STITCH (ENDOMECHANICALS) IMPLANT
RELOAD STAPLE 60 3.6 BLU REG (STAPLE) ×2 IMPLANT
RELOAD STAPLE 60 3.8 GOLD REG (STAPLE) ×2 IMPLANT
RELOAD STAPLE 60 4.1 GRN THCK (STAPLE) IMPLANT
RELOAD STAPLER BLUE 60MM (STAPLE) ×12 IMPLANT
RELOAD STAPLER GOLD 60MM (STAPLE) ×2 IMPLANT
RELOAD STAPLER GREEN 60MM (STAPLE) IMPLANT
RELOAD SUT TRIPLE-STITCH 2-0 (ENDOMECHANICALS) IMPLANT
SCISSORS LAP 5X45 EPIX DISP (ENDOMECHANICALS) ×3 IMPLANT
SET TUBE SMOKE EVAC HIGH FLOW (TUBING) ×3 IMPLANT
SHEARS HARMONIC ACE PLUS 45CM (MISCELLANEOUS) ×3 IMPLANT
SLEEVE ADV FIXATION 5X100MM (TROCAR) ×6 IMPLANT
SLEEVE GASTRECTOMY 40FR VISIGI (MISCELLANEOUS) ×3 IMPLANT
SOL ANTI FOG 6CC (MISCELLANEOUS) ×2 IMPLANT
SOLUTION ANTI FOG 6CC (MISCELLANEOUS) ×1
SPIKE FLUID TRANSFER (MISCELLANEOUS) ×3 IMPLANT
SPONGE T-LAP 18X18 ~~LOC~~+RFID (SPONGE) ×3 IMPLANT
STAPLE LINE REINFORCEMENT LAP (STAPLE) ×6 IMPLANT
STAPLER ECHELON LONG 3000 60 (ENDOMECHANICALS) ×1 IMPLANT
STAPLER ECHELON LONG 60 440 (INSTRUMENTS) ×2 IMPLANT
STAPLER RELOAD BLUE 60MM (STAPLE) ×18
STAPLER RELOAD GOLD 60MM (STAPLE) ×3
STAPLER RELOAD GREEN 60MM (STAPLE)
STRIP CLOSURE SKIN 1/2X4 (GAUZE/BANDAGES/DRESSINGS) ×3 IMPLANT
SUT MNCRL AB 4-0 PS2 18 (SUTURE) ×3 IMPLANT
SUT SURGIDAC NAB ES-9 0 48 120 (SUTURE) ×1 IMPLANT
SUT VICRYL 0 TIES 12 18 (SUTURE) ×3 IMPLANT
SYR 10ML ECCENTRIC (SYRINGE) ×3 IMPLANT
SYR 20ML LL LF (SYRINGE) ×3 IMPLANT
SYR 50ML LL SCALE MARK (SYRINGE) ×3 IMPLANT
TOWEL OR 17X26 10 PK STRL BLUE (TOWEL DISPOSABLE) ×3 IMPLANT
TOWEL OR NON WOVEN STRL DISP B (DISPOSABLE) ×3 IMPLANT
TROCAR ADV FIXATION 5X100MM (TROCAR) ×3 IMPLANT
TROCAR BLADELESS 15MM (ENDOMECHANICALS) ×3 IMPLANT
TROCAR BLADELESS OPT 5 100 (ENDOMECHANICALS) ×3 IMPLANT
TUBING CONNECTING 10 (TUBING) ×3 IMPLANT
TUBING ENDO SMARTCAP (MISCELLANEOUS) ×3 IMPLANT

## 2022-02-19 NOTE — Progress Notes (Signed)
Pt placed on cpap and she is tolerating it well at this time.

## 2022-02-19 NOTE — Op Note (Signed)
Operative Note  Khadeeja Elden Tomlinson  [id number]  [id number]  02/19/2022   Surgeon: Berna Bue MD   Assistant: Feliciana Rossetti MD   Procedure performed: laparoscopic sleeve gastrectomy, hiatal hernia repair, upper endoscopy   Preop diagnosis: Morbid obesity Body mass index is 42.62 kg/m. Post-op diagnosis/intraop findings: same   Specimens: fundus Retained items: none  EBL: minimal  Complications: none   Description of procedure: After obtaining informed consent and administration of chemical DVT prophylaxis in holding, the patient was taken to the operating room and placed supine on operating room table where general endotracheal anesthesia was initiated, preoperative antibiotics were administered, SCDs applied, and a formal timeout was performed. The abdomen was prepped and draped in usual sterile fashion. Peritoneal access was gained using a Visiport technique in the left upper quadrant and insufflation to 15 mmHg ensued without issue. Gross inspection revealed no evidence of injury.  Under direct visualization three more 5 mm trochars were placed in the right and left hemiabdomen and the 39mm trocar in the right paramedian upper abdomen. Bilateral laparoscopic assisted TAPS blocks were performed with Exparel diluted with 0.25 percent Marcaine with epinephrine. The patient was placed in steep Trendelenburg and the liver retractor was introduced through an incision in the upper midline and secured to the post externally to maintain the left lobe retracted anteriorly.  There was a small sliding hiatal hernia. The pars lucida was entered with Harmonic scalpel and the posterior aspect of the right and left crus were dissected out using Harmonic and blunt dissection. The hiatus was narrowed with a single posterior suture of 0 Ethibond secured with the ty-knot device.  Using the Harmonic scalpel, the greater curvature of the stomach was dissected away from the greater omentum and short  gastric vessels were divided. This began 6 cm from the pylorus, and dissection proceeded until the left crus was clearly exposed. There were some filmy adhesions of the posterior stomach to the pancreas which were divided with the Harmonic. The 36 [place] VisiGi was then introduced and directed down towards the pylorus. This was placed to suction against the lesser curve. Serial fires of the linear cutting stapler with seamguards were then employed to create our sleeve. The first fire used a gold load and ensured adequate room at the angularis incisura. Several blue loads were then employed to create a narrow tubular stomach up to the angle of His. The excised stomach was then removed through our 15 mm trocar site within an Endo Catch bag.  The visigi was taken off of suction and a few puffs of air were introduced, inflating the sleeve. No bubbles were observed in the irrigation fluid around the stomach and the shape was noted to be evenly tubular without any narrowing at the angularis. The visigi was then removed. Upper endoscopy was performed by the assistant surgeon and the sleeve was noted to be airtight, the staple line was hemostatic. Please see his separate note. The endoscope was removed. The 15 mm trocar site fascia in the right upper abdomen was closed with a 0 Vicryl using the laparoscopic suture passer under direct visualization. The liver retractor was removed under direct visualization. The abdomen was then desufflated and all remaining trochars removed. The skin incisions were closed with subcuticular Monocryl; benzoin, Steri-Strips and Band-Aids were applied The patient was then awakened, extubated and taken to PACU in stable condition.     All counts were correct at the completion of the case.

## 2022-02-19 NOTE — Transfer of Care (Signed)
Immediate Anesthesia Transfer of Care Note  Patient: Kristin Gibson  Procedure(s) Performed: Procedure(s): LAPAROSCOPIC GASTRIC SLEEVE RESECTION (N/A) UPPER GI ENDOSCOPY (N/A)  Patient Location: PACU  Anesthesia Type:General  Level of Consciousness: Patient easily awoken, sedated, comfortable, cooperative, following commands, responds to stimulation.   Airway & Oxygen Therapy: Patient spontaneously breathing, ventilating well, oxygen via simple oxygen mask.  Post-op Assessment: Report given to PACU RN, vital signs reviewed and stable, moving all extremities.   Post vital signs: Reviewed and stable.  Complications: No apparent anesthesia complications Last Vitals:  Vitals Value Taken Time  BP    Temp    Pulse    Resp    SpO2      Last Pain:  Vitals:   02/19/22 0821  TempSrc:   PainSc: 0-No pain         Complications: No notable events documented.

## 2022-02-19 NOTE — Anesthesia Postprocedure Evaluation (Signed)
Anesthesia Post Note  Patient: Alicea Wente Wernette  Procedure(s) Performed: LAPAROSCOPIC GASTRIC SLEEVE RESECTION, HIATAL HERNIA REPAIR (Abdomen) UPPER GI ENDOSCOPY     Patient location during evaluation: PACU Anesthesia Type: General Level of consciousness: awake and alert, oriented and patient cooperative Pain management: pain level controlled Vital Signs Assessment: post-procedure vital signs reviewed and stable Respiratory status: spontaneous breathing, nonlabored ventilation and respiratory function stable Cardiovascular status: blood pressure returned to baseline and stable Postop Assessment: no apparent nausea or vomiting Anesthetic complications: no   No notable events documented.  Last Vitals:  Vitals:   02/19/22 1145 02/19/22 1202  BP: 140/82 (!) 137/120  Pulse: 83 87  Resp: 18 16  Temp: (!) 36.4 C (!) 36.4 C  SpO2: 100% 99%    Last Pain:  Vitals:   02/19/22 1202  TempSrc: Oral  PainSc:                  Lannie Fields

## 2022-02-19 NOTE — H&P (Signed)
Kristin Gibson [id number]    Referring Provider:  Self     Subjective    Chief Complaint: pre-op bariatric surgery       History of Present Illness: Returns for follow-up regarding surgical treatment of morbid obesity.  Denies any changes in her health since our last visit which was nearly a year ago.  She has completed the bariatric pathway with no barriers identified.  She has several insightful questions to discuss today.   CXR/UGI 02/18/21: small hiatal hernia, no reflux Dietician: approved, completed 6 months of supervised weight loss   02/03/21: Follows up to reestablish care for surgical management of morbid obesity.  Since our last visit 3 years ago, she has been doing fairly well.  She has moved into her own apartment out of her daughter's home and is able to prepare her own meals which makes it easier to stick with a certain diet plan.  She has had a right knee replacement.  She has had a couple of bouts of diverticulitis but has not had any surgical intervention regarding this.  Otherwise she denies any significant changes in her health and she remains interested in sleeve gastrectomy. Last CT scan was in July 2021 which did show some distal sigmoid diverticulitis without abscess or evidence of perforation. 293lb/ BMI 42   02/12/18: 66 year old woman who is here to discuss weight loss surgery.  She was seen by Dr. Cliffton Asters earlier this month and in August 2018 for surgical planning for a laparoscopic sigmoidectomy for recurrent bouts of non-complicated diverticulitis however as of the most recent visit, they have decided to pursue further observation as she had not had an "attack" in the last 8 months.  She has been followed by her primary care doctor, Dr. Delane Ginger, at [facility] for weight loss attempts.  Over the years she has tried numerous things with minimal success. Previous attempts have included low-carb, Weight Watchers, Slim fast, appetite suppressant medication in the 80s,  daily walking although exercise limited due to knee pain.  She is consistently been between 280 and 300 pounds for the last couple years.  With each attempt at weight loss she will successfully lose up to 30 pounds but ultimately regains it. She has been started with her weight all of her life, but this was exacerbated by her pregnancies, her children are now in her late 23s.  She wants to have weight loss surgery so that she can be more active in her life, engagement with her family, and she needs to have her knees replaced which she was told would require her to lose weight.   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  She states that the reflux is controlled on daily Protonix.  She thinks she may have been told she had a hiatal hernia at some point in the past.   SurgHx: tubal ligation 1981 by a Pfannenstiel, vaginal hysterectomy 1993. Knee surgery arthroscopic, right, 3 times and breast reduction surgery.   FHx HTN, cancer   SocHx: occasional Etoh. Former smoker quit 1981.  Her family lives here in [place].  She did live in [place] for about 10 years but moved back here a couple years ago.  She is retired, previously was a Hotel manager.   Allergies: sulfa abx, sulfur- reaction is vomiting.   Meds: Amlodipine, Benazepril, Clonidine, HCTZ, wellbutrin, celebrex, Bentyl, Protonix.   Review of Systems: A complete review of systems was obtained  from the patient.  I have reviewed this information and discussed as appropriate with the patient.  See HPI as well for other ROS.     Medical History: Past Medical History         Past Medical History:  Diagnosis Date   Arthritis     Hypertension     Sleep apnea          There is no problem list on file for this patient.     Past Surgical History           Past Surgical History:  Procedure Laterality Date   breast reduction surgery  N/A     right knee replacement surgery N/A          Allergies           Allergies  Allergen Reactions   Benazepril Swelling      Angioedema Angioedema     Other Itching and Nausea   Sulfa (Sulfonamide Antibiotics) Itching and Nausea   Sulfur (Not Sulfa) Nausea                   Current Outpatient Medications on File Prior to Visit  Medication Sig Dispense Refill   amLODIPine (NORVASC) 10 MG tablet Take 1 tablet by mouth once daily       cloNIDine HCL (CATAPRES) 0.1 MG tablet Take 1 tablet by mouth 2 (two) times daily       FUROsemide (LASIX) 20 MG tablet TAKE 1 TABLET BY MOUTH DAILY AS NEEDED FOR LOWER EXTREMITY SWELLING       methocarbamoL (ROBAXIN) 500 MG tablet Take by mouth every 8 (eight) hours as needed       pantoprazole (PROTONIX) 40 MG DR tablet Take 1 tablet by mouth once daily       spironolactone (ALDACTONE) 50 MG tablet Take 1 tablet by mouth once daily       tiZANidine (ZANAFLEX) 2 MG tablet Take by mouth every 8 (eight) hours as needed       acetaminophen (TYLENOL) 650 MG ER tablet Take by mouth every 8 (eight) hours as needed        No current facility-administered medications on file prior to visit.      Family History  History reviewed. No pertinent family history.      Social History           Tobacco Use  Smoking Status Former   Types: Cigarettes  Smokeless Tobacco Never      Social History  Social History             Socioeconomic History   Marital status: Divorced  Tobacco Use   Smoking status: Former      Types: Cigarettes   Smokeless tobacco: Never  Vaping Use   Vaping Use: Never used  Substance and Sexual Activity   Alcohol use: Yes   Drug use: Never        Objective:           Vitals:    01/04/22 0952  BP: 132/80  Pulse: 102  Temp: 36.4 C (97.6 F)  SpO2: 99%  Weight: (!) 135 kg (297 lb 9.6 oz)  Height: 175.3 cm (5\' 9" )    Body mass index is 43.95 kg/m.   Alert and well-appearing Unlabored respirations    Assessment and Plan:  Diagnoses and all orders for this visit:   Morbid obesity (CMS-HCC)     MORBID OBESITY (E66.01) Story: She remains a suitable candidate  for sleeve gastrectomy. We discussed the technique of the surgery including the risks of bleeding, infection, pain, scarring, intra-abdominal injury, staple line leak, blood clots, pneumonia, heart attack, stroke, death, incisional hernia, chronic nausea, weight regain, exacerbation of reflux etc. discussed frequent constipation after this operation and that she will need to stay on top of this to avoid further exacerbation of her diverticulosis. We discussed the typical one-night stay in the hospital and the typical postoperative course. I discussed with her the importance of the input from the nutritionist and psychologist as if behavioral changes are not made after surgery, she is at high risk of weight regain. Discussed that at her age her weight loss will be slower and less robust than average. Questions were welcomed and answered.  We will plan to proceed with surgery as scheduled. 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 Hoping to do the left after she lost weight.  Having a lot more pain in the left knee this days. DIVERTICULITIS (K57.92) Story: She has seen Dr. Cliffton Asters in the past, has had a couple of episodes that have been mild and have not required intervention in the last few years.     Lawrance Wiedemann Carlye Grippe, MD

## 2022-02-19 NOTE — Progress Notes (Signed)
PHARMACY CONSULT FOR:  Risk Assessment for Post-Discharge VTE Following Bariatric Surgery  Post-Discharge VTE Risk Assessment: This patient's probability of 30-day post-discharge VTE is increased due to the factors marked: X Sleeve gastrectomy   Liver disorder (transplant, cirrhosis, or nonalcoholic steatohepatitis)   Hx of VTE   Hemorrhage requiring transfusion   GI perforation, leak, or obstruction   ====================================================    Female  X  Age >/=60 years    BMI >/=50 kg/m2    CHF    Dyspnea at Rest    Paraplegia  X  Non-gastric-band surgery    Operation Time >/=3 hr    Return to OR     Length of Stay >/= 3 d   Hypercoagulable condition   Significant venous stasis      Predicted probability of 30-day post-discharge VTE: 0.31%  Other patient-specific factors to consider: no  Recommendation for Discharge: No pharmacologic prophylaxis post-discharge  Kristin Gibson is a 66 y.o. female who underwent laparoscopic sleeve gastrectomy 02/19/2022    Allergies  Allergen Reactions   Benazepril Swelling   Sulfa Antibiotics Nausea Only   Elemental Sulfur Nausea Only    Patient Measurements: Height: 5\' 9"  (175.3 cm) Weight: 130.9 kg (288 lb 9.6 oz) IBW/kg (Calculated) : 66.2 Body mass index is 42.62 kg/m.  No results for input(s): WBC, HGB, HCT, PLT, APTT, CREATININE, LABCREA, CREATININE, CREAT24HRUR, MG, PHOS, ALBUMIN, PROT, ALBUMIN, AST, ALT, ALKPHOS, BILITOT, BILIDIR, IBILI in the last 72 hours. Estimated Creatinine Clearance: 100.6 mL/min (by C-G formula based on SCr of 0.69 mg/dL).    Past Medical History:  Diagnosis Date   Arthritis    Back pain    Diverticulitis    GERD (gastroesophageal reflux disease)    Hypertension    Knee pain, chronic    PONV (postoperative nausea and vomiting)    Pre-diabetes    Sleep apnea    cpap     Medications Prior to Admission  Medication Sig Dispense Refill Last Dose   Acetylcysteine (NAC)  500 MG CAPS Take 500 mg by mouth in the morning and at bedtime.   02/18/2022   amLODipine (NORVASC) 10 MG tablet Take 10 mg by mouth daily with supper.   02/18/2022   cloNIDine (CATAPRES) 0.1 MG tablet Take 0.1 mg by mouth daily with supper.   02/18/2022   dicyclomine (BENTYL) 20 MG tablet Take 20 mg by mouth every 8 (eight) hours.   02/18/2022   Multiple Vitamin (MULTIVITAMIN) tablet Take 1 tablet by mouth daily.   02/18/2022   pantoprazole (PROTONIX) 40 MG tablet Take 40 mg by mouth daily with supper.   02/18/2022   polyethylene glycol (MIRALAX / GLYCOLAX) 17 g packet Take 17 g by mouth daily as needed (constipation).   02/18/2022   Probiotic CAPS Take 1 capsule by mouth daily. 30 capsule 0 02/18/2022   spironolactone (ALDACTONE) 50 MG tablet Take 50 mg by mouth daily with supper.   02/18/2022   tizanidine (ZANAFLEX) 2 MG capsule Take 2 mg by mouth every 8 (eight) hours as needed for muscle spasms.   02/18/2022   acetaminophen (TYLENOL) 500 MG tablet Take 1,000 mg by mouth daily as needed for mild pain. pain    More than a month   methocarbamol (ROBAXIN) 500 MG tablet Take 500 mg by mouth every 8 (eight) hours as needed for muscle spasms.   More than a month    02/20/2022, Pharm.D 02/19/2022 2:00 PM

## 2022-02-19 NOTE — Anesthesia Preprocedure Evaluation (Addendum)
Anesthesia Evaluation  Patient identified by MRN, date of birth, ID band Patient awake    Reviewed: Allergy & Precautions, NPO status , Patient's Chart, lab work & pertinent test results  History of Anesthesia Complications (+) PONV and history of anesthetic complications  Airway Mallampati: II  TM Distance: >3 FB Neck ROM: Full    Dental  (+) Teeth Intact, Dental Advisory Given   Pulmonary sleep apnea and Continuous Positive Airway Pressure Ventilation , former smoker,    Pulmonary exam normal breath sounds clear to auscultation       Cardiovascular hypertension (119/86 in preop), Normal cardiovascular exam Rhythm:Regular Rate:Normal     Neuro/Psych PSYCHIATRIC DISORDERS Depression negative neurological ROS     GI/Hepatic Neg liver ROS, GERD  Controlled,  Endo/Other  diabetes (pre-diabetic)Morbid obesityBMI 44  Renal/GU negative Renal ROS  negative genitourinary   Musculoskeletal  (+) Arthritis , Osteoarthritis,    Abdominal (+) + obese,   Peds  Hematology negative hematology ROS (+) hct 42.3   Anesthesia Other Findings   Reproductive/Obstetrics negative OB ROS                            Anesthesia Physical Anesthesia Plan  ASA: 3  Anesthesia Plan: General   Post-op Pain Management: Tylenol PO (pre-op)*, Ketamine IV* and Lidocaine infusion*   Induction: Intravenous  PONV Risk Score and Plan: Ondansetron, Dexamethasone, Midazolam, Treatment may vary due to age or medical condition and Scopolamine patch - Pre-op  Airway Management Planned: Oral ETT  Additional Equipment: None  Intra-op Plan:   Post-operative Plan: Extubation in OR  Informed Consent: I have reviewed the patients History and Physical, chart, labs and discussed the procedure including the risks, benefits and alternatives for the proposed anesthesia with the patient or authorized representative who has indicated  his/her understanding and acceptance.     Dental advisory given  Plan Discussed with: CRNA  Anesthesia Plan Comments:        Anesthesia Quick Evaluation

## 2022-02-19 NOTE — Anesthesia Procedure Notes (Signed)
Procedure Name: Intubation Date/Time: 02/19/2022 9:53 AM Performed by: Deliah Boston, CRNA Pre-anesthesia Checklist: Patient identified, Emergency Drugs available, Suction available and Patient being monitored Patient Re-evaluated:Patient Re-evaluated prior to induction Oxygen Delivery Method: Circle system utilized Preoxygenation: Pre-oxygenation with 100% oxygen Induction Type: IV induction Ventilation: Mask ventilation without difficulty Laryngoscope Size: Mac and 4 Grade View: Grade I Tube type: Oral Tube size: 7.5 mm Number of attempts: 1 Airway Equipment and Method: Stylet and Oral airway Placement Confirmation: ETT inserted through vocal cords under direct vision, positive ETCO2 and breath sounds checked- equal and bilateral Secured at: 20 cm Tube secured with: Tape Dental Injury: Teeth and Oropharynx as per pre-operative assessment

## 2022-02-19 NOTE — Op Note (Signed)
Preoperative diagnosis: laparoscopic sleeve gastrectomy  Postoperative diagnosis: Same   Procedure: Upper endoscopy   Surgeon: Feliciana Rossetti, M.D.  Anesthesia: Gen.   Indications for procedure: This patient was undergoing a laparoscopic sleeve gastrectomy.   Description of procedure: The endoscopy was placed in the mouth and into the oropharynx and under endoscopic vision it was advanced to the esophagogastric junction.  The stomach was insufflated and no bleeding or bubbles were seen.  The GEJ was identified at 35cm from the teeth. The GE junction was below the diaphragm closure. No bleeding or leaks were detected. The scope was withdrawn without difficulty.    Feliciana Rossetti, M.D. General, Bariatric, & Minimally Invasive Surgery [facility]

## 2022-02-20 ENCOUNTER — Encounter (HOSPITAL_COMMUNITY): Payer: Self-pay | Admitting: Surgery

## 2022-02-20 LAB — CBC WITH DIFFERENTIAL/PLATELET
Abs Immature Granulocytes: 0.04 10*3/uL (ref 0.00–0.07)
Basophils Absolute: 0 10*3/uL (ref 0.0–0.1)
Basophils Relative: 0 %
Eosinophils Absolute: 0 10*3/uL (ref 0.0–0.5)
Eosinophils Relative: 0 %
HCT: 38.5 % (ref 36.0–46.0)
Hemoglobin: 12.5 g/dL (ref 12.0–15.0)
Immature Granulocytes: 1 %
Lymphocytes Relative: 12 %
Lymphs Abs: 0.9 10*3/uL (ref 0.7–4.0)
MCH: 29.8 pg (ref 26.0–34.0)
MCHC: 32.5 g/dL (ref 30.0–36.0)
MCV: 91.9 fL (ref 80.0–100.0)
Monocytes Absolute: 0.5 10*3/uL (ref 0.1–1.0)
Monocytes Relative: 7 %
Neutro Abs: 6.4 10*3/uL (ref 1.7–7.7)
Neutrophils Relative %: 80 %
Platelets: 183 10*3/uL (ref 150–400)
RBC: 4.19 MIL/uL (ref 3.87–5.11)
RDW: 14.5 % (ref 11.5–15.5)
WBC: 7.9 10*3/uL (ref 4.0–10.5)
nRBC: 0 % (ref 0.0–0.2)

## 2022-02-20 LAB — COMPREHENSIVE METABOLIC PANEL
ALT: 24 U/L (ref 0–44)
AST: 26 U/L (ref 15–41)
Albumin: 3.8 g/dL (ref 3.5–5.0)
Alkaline Phosphatase: 47 U/L (ref 38–126)
Anion gap: 8 (ref 5–15)
BUN: 14 mg/dL (ref 8–23)
CO2: 23 mmol/L (ref 22–32)
Calcium: 8.8 mg/dL — ABNORMAL LOW (ref 8.9–10.3)
Chloride: 103 mmol/L (ref 98–111)
Creatinine, Ser: 0.71 mg/dL (ref 0.44–1.00)
GFR, Estimated: >60 mL/min (ref >60)
Glucose, Bld: 116 mg/dL — ABNORMAL HIGH (ref 70–99)
Potassium: 4.3 mmol/L (ref 3.5–5.1)
Sodium: 134 mmol/L — ABNORMAL LOW (ref 135–145)
Total Bilirubin: 0.3 mg/dL (ref 0.3–1.2)
Total Protein: 7.7 g/dL (ref 6.5–8.1)

## 2022-02-20 LAB — MAGNESIUM: Magnesium: 1.9 mg/dL (ref 1.7–2.4)

## 2022-02-20 MED ORDER — GABAPENTIN 100 MG PO CAPS: 200.0000 mg | ORAL_CAPSULE | Freq: Two times a day (BID) | ORAL | 0 refills | Status: DC

## 2022-02-20 MED ORDER — ONDANSETRON 4 MG PO TBDP: 4.0000 mg | ORAL_TABLET | Freq: Four times a day (QID) | ORAL | 0 refills | Status: DC | PRN

## 2022-02-20 MED ORDER — ACETAMINOPHEN 500 MG PO TABS: 1000.0000 mg | ORAL_TABLET | Freq: Three times a day (TID) | ORAL | 0 refills | Status: AC

## 2022-02-20 MED ORDER — PANTOPRAZOLE SODIUM 40 MG PO TBEC: 40.0000 mg | DELAYED_RELEASE_TABLET | Freq: Every day | ORAL | 0 refills | Status: DC

## 2022-02-20 MED ORDER — TRAMADOL HCL 50 MG PO TABS: 50.0000 mg | ORAL_TABLET | Freq: Four times a day (QID) | ORAL | 0 refills | Status: DC | PRN

## 2022-02-20 NOTE — Progress Notes (Signed)
Patient alert and oriented, Post op day 1.  Provided support and encouragement.  Encouraged pulmonary toilet, ambulation and small sips of liquids.  Completed bari clear liquid started protein shake.  Complaints of dizziness when out of bed.  All questions answered.  Will continue to monitor.

## 2022-02-20 NOTE — Progress Notes (Signed)
S: No acute events. Epigastric pain with liquids.   O: Vitals, labs, intake/output, and orders reviewed at this time.  No fever, no tachycardia, mildly hypertensive, saturating 94% on room air.  P.o. 480, urine output 3200.  CMP unremarkable, WBC 7.9, hemoglobin 12.5 (13.3 preop) PRNs-Dilaudid x1 yesterday at 2:41 PM, oxycodone x1 yesterday at 6:21 PM, tramadol x1 at 04:19 this morning, simethicone x2 yesterday  Gen: A&Ox3, no distress  H&N: EOMI, atraumatic, neck supple Chest: unlabored respirations, RRR Abd: soft, tender, nondistended, incision(s) c/d/i with no cellulitis or hematoma Ext: warm, no edema Neuro: grossly normal  Lines/tubes/drains: PIV  A/P: Postop day 1 status post sleeve gastrectomy Continue clear liquids and protein shake Continue frequent ambulation, SCDs while in bed, prophylactic Lovenox and pulmonary toilet Anticipate discharge home later today.   Phylliss Blakes, MD [facility], [place]

## 2022-02-20 NOTE — Discharge Instructions (Signed)
   GASTRIC BYPASS/SLEEVE  Home Care Instructions   These instructions are to help you care for yourself when you go home.  Call: If you have any problems. Call [phone] and ask for the surgeon on call If you need immediate help, come to the ER at [facility].  Tell the ER staff that you are a new post-op gastric bypass or gastric sleeve patient   Signs and symptoms to report: Severe vomiting or nausea If you cannot keep down clear liquids for longer than 1 day, call your surgeon  Abdominal pain that does not get better after taking your pain medication Fever over 100.4 F with chills Heart beating over 100 beats a minute Shortness of breath at rest Chest pain  Redness, swelling, drainage, or foul odor at incision (surgical) sites  If your incisions open or pull apart Swelling or pain in calf (lower leg) Diarrhea (Loose bowel movements that happen often), frequent watery, uncontrolled bowel movements Constipation, (no bowel movements for 3 days) if this happens: Pick one Milk of Magnesia, 2 tablespoons by mouth, 3 times a day for 2 days if needed Stop taking Milk of Magnesia once you have a bowel movement Call your doctor if constipation continues Or Miralax  (instead of Milk of Magnesia) following the label instructions Stop taking Miralax once you have a bowel movement Call your doctor if constipation continues Anything you think is not normal   Normal side effects after surgery: Unable to sleep at night or unable to focus Irritability or moody Being tearful (crying) or depressed These are common complaints, possibly related to your anesthesia medications that put you to sleep, stress of surgery, and change in lifestyle.  This usually goes away a few weeks after surgery.  If these feelings continue, call your primary care doctor.   Wound Care: You may have surgical glue, steri-strips, or staples over your incisions after surgery Surgical glue:  Looks like a clear film  over your incisions and will wear off a little at a time Steri-strips: Strips of tape over your incisions. You may notice a yellowish color on the skin under the steri-strips. This is used to make the   steri-strips stick better. Do not pull the steri-strips off - let them fall off Staples: Staples may be removed before you leave the hospital If you go home with staples, call [facility], [phone] at for an appointment with your surgeon's nurse to have staples removed 10 days after surgery. Showering: You may shower two (2) days after your surgery unless your surgeon tells you differently Wash gently around incisions with warm soapy water, rinse well, and gently pat dry  No tub baths until staples are removed, steri-strips fall off or glue is gone.    Medications: Medications should be liquid or crushed if larger than the size of a dime Extended release pills (medication that release a little bit at a time through the day) should NOT be crushed or cut. (examples include XL, ER, DR, SR) Depending on the size and number of medications you take, you may need to space (take a few throughout the day)/change the time you take your medications so that you do not over-fill your pouch (smaller stomach) Make sure you follow-up with your primary care doctor to make medication changes needed during rapid weight loss and life-style changes If you have diabetes, follow up with the doctor that orders your diabetes medication(s) within one week after surgery and check your blood sugar regularly.   Do not drive while taking prescription pain medication  It is ok to take Tylenol by the bottle instructions with your pain medicine or instead of your pain medicine as needed.  DO NOT TAKE NSAIDS (EXAMPLES OF NSAIDS:  IBUPROFREN/ NAPROXEN)  Diet:                    First 2 Weeks  You will see the dietician t about two (2) weeks after your surgery. The dietician will increase the types of foods you can eat  if you are handling liquids well: If you have severe vomiting or nausea and cannot keep down clear liquids lasting longer than 1 day, call your surgeon @ ([phone]) Protein Shake Drink at least 2 ounces of shake 5-6 times per day Each serving of protein shakes (usually 8 - 12 ounces) should have: 15 grams of protein  And no more than 5 grams of carbohydrate  Goal for protein each day: Men = 80 grams per day Women = 60 grams per day Protein powder may be added to fluids such as non-fat milk or Lactaid milk or unsweetened Soy/Almond milk (limit to 35 grams added protein powder per serving)  Hydration Slowly increase the amount of water and other clear liquids as tolerated (See Acceptable Fluids) Slowly increase the amount of protein shake as tolerated   Sip fluids slowly and throughout the day.  Do not use straws. May use sugar substitutes in small amounts (no more than 6 - 8 packets per day; i.e. Splenda)  Fluid Goal The first goal is to drink at least 8 ounces of protein shake/drink per day (or as directed by the nutritionist); some examples of protein shakes are Syntrax Nectar, Adkins Advantage, EAS Edge HP, and Unjury. See handout from pre-op Bariatric Education Class: Slowly increase the amount of protein shake you drink as tolerated You may find it easier to slowly sip shakes throughout the day It is important to get your proteins in first Your fluid goal is to drink 64 - 100 ounces of fluid daily It may take a few weeks to build up to this 32 oz (or more) should be clear liquids  And  32 oz (or more) should be full liquids (see below for examples) Liquids should not contain sugar, caffeine, or carbonation  Clear Liquids: Water or Sugar-free flavored water (i.e. Fruit H2O, Propel) Decaffeinated coffee or tea (sugar-free) Crystal Lite, Wyler's Lite, Minute Maid Lite Sugar-free Jell-O Bouillon or broth Sugar-free Popsicle:   *Less than 20 calories each; Limit 1 per  day  Full Liquids: Protein Shakes/Drinks + 2 choices per day of other full liquids Full liquids must be: No More Than 15 grams of Carbs per serving  No More Than 3 grams of Fat per serving Strained low-fat cream soup (except Cream of Potato or Tomato) Non-Fat milk Fat-free Lactaid Milk Unsweetened Soy Or Unsweetened Almond Milk Low Sugar yogurt (Dannon Lite & Fit, Greek yogurt; Oikos Triple Zero; Chobani Simply 100; Yoplait 100 calorie Greek - No Fruit on the Bottom)    Vitamins and Minerals Start 1 day after surgery unless otherwise directed by your surgeon 2 Chewable Bariatric Specific Multivitamin / Multimineral Supplement with iron (Example: Bariatric Advantage Multi EA) Chewable Calcium with Vitamin D-3 (Example: 3 Chewable Calcium Plus 600 with Vitamin D-3) Take 500 mg three (3) times a day for a total of 1500 mg each day Do not take all 3 doses of calcium at one time as it may cause constipation, and   you can only absorb 500 mg  at a time  Do not mix multivitamins containing iron with calcium supplements; take 2 hours apart Menstruating women and those with a history of anemia (a blood disease that causes weakness) may need extra iron Talk with your doctor to see if you need more iron Do not stop taking or change any vitamins or minerals until you talk to your dietitian or surgeon Your Dietitian and/or surgeon must approve all vitamin and mineral supplements   Activity and Exercise: Limit your physical activity as instructed by your doctor.  It is important to continue walking at home.  During this time, use these guidelines: Do not lift anything greater than ten (10) pounds for at least two (2) weeks Do not go back to work or drive until your surgeon says you can You may have sex when you feel comfortable  It is VERY important for female patients to use a reliable birth control method; fertility often increases after surgery  All hormonal birth control will be ineffective for 30  days after surgery due to medications given during surgery a barrier method must be used. Do not get pregnant for at least 18 months Start exercising as soon as your doctor tells you that you can Make sure your doctor approves any physical activity Start with a simple walking program Walk 5-15 minutes each day, 7 days per week.  Slowly increase until you are walking 30-45 minutes per day Consider joining our BELT program. [phone] or email [email or web address]   Special Instructions Things to remember: Use your CPAP when sleeping if this applies to you  [facility] has two free Bariatric Surgery Support Groups that meet monthly The 3rd Thursday of each month, 6 pm, [facility]  The 2nd Friday of each month, 11:45 am in the private dining room in the basement of [place] It is very important to keep all follow up appointments with your surgeon, dietitian, primary care physician, and behavioral health practitioner Routine follow up schedule with your surgeon include appointments at 2-3 weeks, 6-8 weeks, 6 months, and 1 year at a minimum.  Your surgeon may request to see you more often.   After the first year, please follow up with your bariatric surgeon and dietitian at least once a year in order to maintain best weight loss results [facility]: [phone] [facility]: [phone] Bariatric Nurse Coordinator: [phone]      Reviewed and Endorsed  by  Patient Education Committee, June, 2016 Edits Approved: Aug, 2018    

## 2022-02-20 NOTE — Progress Notes (Signed)
Transition of Care [facility]) Screening Note  Patient Details  Name: Kristin Gibson Date of Birth: [birth year]  Transition of Care [facility]) CM/SW Contact:    Ewing Schlein, LCSW Phone Number: 02/20/2022, 10:43 AM  Transition of Care Department [facility]) has reviewed patient and no TOC needs have been identified at this time. We will continue to monitor patient advancement through interdisciplinary progression rounds. If new patient transition needs arise, please place a TOC consult.

## 2022-02-20 NOTE — Progress Notes (Signed)
Patient alert and oriented, pain is controlled. Patient is tolerating fluids, advanced to protein shake today, patient is tolerating well. Reviewed Gastric Bypass discharge instructions with patient and patient is able to articulate understanding. Provided information on BELT program, Support Group and [facility] outpatient pharmacy. All questions answered, will continue to monitor.    

## 2022-02-21 LAB — SURGICAL PATHOLOGY

## 2022-02-21 NOTE — Progress Notes (Signed)
24hr fluid recall prior to discharge: .  Per dehydration protocol, will call pt to f/u within one week post op.

## 2022-02-21 NOTE — Discharge Summary (Signed)
Physician Discharge Summary  Kristin Gibson Passe EVO:[id number] DOB: [birth year] DOA: 02/19/2022  PCP: Marjory Sneddon, MD  Admit date: 02/19/2022 Discharge date: 02/20/2022  Recommendations for Outpatient Follow-up:    Follow-up Information     Clovis Riley, MD. Go on 03/16/2022.   Specialty: General Surgery Why: at 11:30am.  Please arrive 15 minutes prior to your appointment time.  Thank you. Contact information: [address] [place] [address] [phone]         Surgery, [place]. Go on 04/11/2022.   Specialty: General Surgery Why: at 9:20am with Dr. Kae Heller.  Please arrive 15 minutes prior to your appointment time.  Thank you. Contact information: [place] [address] [phone]                Discharge Diagnoses:  Principal Problem:   Morbid obesity ([place])   Surgical Procedure: Laparoscopic Sleeve Gastrectomy, upper endoscopy  Discharge Condition: Good Disposition: Home  Diet recommendation: Postoperative sleeve gastrectomy diet (liquids only)  Filed Weights   02/19/22 0729  Weight: 130.9 kg     Hospital Course:  The patient was admitted for a planned laparoscopic sleeve gastrectomy. Please see operative note. Preoperatively the patient was given 5000 units of subcutaneous heparin for DVT prophylaxis. Postoperative prophylactic Lovenox dosing was started on the evening of postoperative day 0. ERAS protocol was used. On the evening of postoperative day 0, the patient was started on water and ice chips. On postoperative day 1 the patient had no fever or tachycardia and was tolerating water in their diet was gradually advanced throughout the day. The patient was ambulating without difficulty. Their vital signs are stable without fever or tachycardia. Their hemoglobin had remained stable. The patient had received discharge instructions and counseling. They were deemed stable for discharge and had met  discharge criteria   Discharge Instructions   Allergies as of 02/20/2022       Reactions   Benazepril Swelling   Sulfa Antibiotics Nausea Only   Elemental Sulfur Nausea Only        Medication List     STOP taking these medications    spironolactone 50 MG tablet Commonly known as: ALDACTONE       TAKE these medications    acetaminophen 500 MG tablet Commonly known as: TYLENOL Take 1,000 mg by mouth daily as needed for mild pain. pain What changed: Another medication with the same name was added. Make sure you understand how and when to take each.   acetaminophen 500 MG tablet Commonly known as: TYLENOL Take 2 tablets (1,000 mg total) by mouth every 8 (eight) hours for 5 days. What changed: You were already taking a medication with the same name, and this prescription was added. Make sure you understand how and when to take each.   amLODipine 10 MG tablet Commonly known as: NORVASC Take 10 mg by mouth daily with supper. Notes to patient: Monitor Blood Pressure Daily and keep a log for primary care physician.  You may need to make changes to your medications with rapid weight loss.     cloNIDine 0.1 MG tablet Commonly known as: CATAPRES Take 0.1 mg by mouth daily with supper. Notes to patient: Monitor Blood Pressure Daily and keep a log for primary care physician.  You may need to make changes to your medications with rapid weight loss.     dicyclomine 20 MG tablet Commonly known as: BENTYL Take 20 mg by mouth every 8 (eight)  hours.   gabapentin 100 MG capsule Commonly known as: NEURONTIN Take 2 capsules (200 mg total) by mouth every 12 (twelve) hours.   methocarbamol 500 MG tablet Commonly known as: ROBAXIN Take 500 mg by mouth every 8 (eight) hours as needed for muscle spasms.   multivitamin tablet Take 1 tablet by mouth daily.   NAC 500 MG Caps Generic drug: Acetylcysteine Take 500 mg by mouth in the morning and at bedtime.   ondansetron 4 MG  disintegrating tablet Commonly known as: ZOFRAN-ODT Take 1 tablet (4 mg total) by mouth every 6 (six) hours as needed for nausea or vomiting.   pantoprazole 40 MG tablet Commonly known as: PROTONIX Take 40 mg by mouth daily with supper. What changed: Another medication with the same name was added. Make sure you understand how and when to take each.   pantoprazole 40 MG tablet Commonly known as: PROTONIX Take 1 tablet (40 mg total) by mouth daily. What changed: You were already taking a medication with the same name, and this prescription was added. Make sure you understand how and when to take each.   polyethylene glycol 17 g packet Commonly known as: MIRALAX / GLYCOLAX Take 17 g by mouth daily as needed (constipation).   Probiotic Caps Take 1 capsule by mouth daily.   tizanidine 2 MG capsule Commonly known as: ZANAFLEX Take 2 mg by mouth every 8 (eight) hours as needed for muscle spasms.   traMADol 50 MG tablet Commonly known as: ULTRAM Take 1 tablet (50 mg total) by mouth every 6 (six) hours as needed (pain).        Follow-up Information     Clovis Riley, MD. Go on 03/16/2022.   Specialty: General Surgery Why: at 11:30am.  Please arrive 15 minutes prior to your appointment time.  Thank you. Contact information: [address] [place] [address] [phone]         Surgery, [place]. Go on 04/11/2022.   Specialty: General Surgery Why: at 9:20am with Dr. Kae Heller.  Please arrive 15 minutes prior to your appointment time.  Thank you. Contact information: [place] [address] [phone]                  The results of significant diagnostics from this hospitalization (including imaging, microbiology, ancillary and laboratory) are listed below for reference.    Significant Diagnostic Studies: No results found.  Labs: Basic Metabolic Panel: Recent Labs  Lab 02/20/22 0404  NA 134*  K 4.3   CL 103  CO2 23  GLUCOSE 116*  BUN 14  CREATININE 0.71  CALCIUM 8.8*  MG 1.9   Liver Function Tests: Recent Labs  Lab 02/20/22 0404  AST 26  ALT 24  ALKPHOS 47  BILITOT 0.3  PROT 7.7  ALBUMIN 3.8    CBC: Recent Labs  Lab 02/20/22 0404  WBC 7.9  NEUTROABS 6.4  HGB 12.5  HCT 38.5  MCV 91.9  PLT 183    CBG: Recent Labs  Lab 02/19/22 0804  GLUCAP 86    Principal Problem:   Morbid obesity ([place])     Signed:  [facility], [place] [phone] 02/21/2022, 8:22 AM

## 2022-02-22 ENCOUNTER — Telehealth (HOSPITAL_COMMUNITY): Payer: Self-pay | Admitting: *Deleted

## 2022-02-22 NOTE — Telephone Encounter (Signed)
1.  Tell me about your pain and pain management? ?Pt denies any pain. ? ?2.  Let's talk about fluid intake.  How much total fluid are you taking in? ?Pt states that she is getting in at least 60oz of fluid including protein shakes, bottled water, coffee, and yogurt. Pt instructed to assess status and suggestions daily utilizing Hydration Action Plan on discharge folder and to call [facility] if in the "red zone".  ? ?3.  How much protein have you taken in the last 2 days? ?Pt states she is meeting her goal of 60g of protein each day with the protein shakes. ? ?4.  Have you had nausea?  Tell me about when have experienced nausea and what you did to help? ?Pt denies nausea. ?  ?5.  Has the frequency or color changed with your urine? ?Pt states that she is urinating "fine" with no changes in frequency or urgency.   ?  ?6.  Tell me what your incisions look like? ?"Incisions look fine". Pt denies a fever, chills.  Pt states incisions are not swollen, open, or draining.  Pt encouraged to call [facility] if incisions change. ?  ?7.  Have you been passing gas? BM? ?Pt states that she has not had a BM.  Pt instructed to take either Miralax or MoM as instructed per "Gastric Bypass/Sleeve Discharge Home Care Instructions".  Pt to call surgeon's office if not able to have BM with medication. ?  ?8.  If a problem or question were to arise who would you call?  Do you know contact numbers for [facility], [facility], and [facility]? ?Pt denies dehydration symptoms.  Pt can describe s/sx of dehydration.  Pt knows to call [facility] for surgical, [facility] for nutrition, and [facility] for non-urgent questions or concerns. ?  ?9.  How has the walking going? ?Pt states she is walking around and able to be active without difficulty. ?  ?10. Are you still using your incentive spirometer?  If so, how often? ?Pt encouraged to use incentive spirometer, at least 10x every hour while awake until she sees the surgeon. ? ?11.  How are your vitamins and calcium going?  How are you taking  them? ?Pt states that she is taking her supplements and vitamins without difficulty. ? ?Pt states that she is not sleeping well due to pain.  Encouraged pt to take pain pill at night if needed. Pt also wanted to drink non caffeinated tea, which is on the approved list of clear liquids.  ? ?Reminded patient that the first 30 days post-operatively are important for successful recovery.  Practice good hand hygiene, wearing a mask when appropriate (since optional in most places), and minimizing exposure to people who live outside of the home, especially if they are exhibiting any respiratory, GI, or illness-like symptoms.   ? ?

## 2022-03-01 DIAGNOSIS — Z6841 Body Mass Index (BMI) 40.0 and over, adult: Secondary | ICD-10-CM | POA: Diagnosis not present

## 2022-03-01 DIAGNOSIS — Z8719 Personal history of other diseases of the digestive system: Secondary | ICD-10-CM | POA: Diagnosis not present

## 2022-03-01 DIAGNOSIS — Z9889 Other specified postprocedural states: Secondary | ICD-10-CM | POA: Diagnosis not present

## 2022-03-01 DIAGNOSIS — Z903 Acquired absence of stomach [part of]: Secondary | ICD-10-CM | POA: Diagnosis not present

## 2022-03-01 DIAGNOSIS — Z09 Encounter for follow-up examination after completed treatment for conditions other than malignant neoplasm: Secondary | ICD-10-CM | POA: Diagnosis not present

## 2022-03-06 ENCOUNTER — Other Ambulatory Visit: Payer: Self-pay

## 2022-03-06 ENCOUNTER — Encounter: Payer: Medicare HMO | Attending: Surgery | Admitting: Skilled Nursing Facility1

## 2022-03-06 DIAGNOSIS — Z6841 Body Mass Index (BMI) 40.0 and over, adult: Secondary | ICD-10-CM | POA: Diagnosis not present

## 2022-03-06 DIAGNOSIS — Z713 Dietary counseling and surveillance: Secondary | ICD-10-CM | POA: Insufficient documentation

## 2022-03-06 NOTE — Progress Notes (Signed)
2 Week Post-Operative Nutrition Class ?  ?Patient was seen on 03/06/2022 for Post-Operative Nutrition education at [facility].  ?  ?Surgery date: 02/19/2022 ?Surgery type: sleeve ?[facility] weight at [facility]: 294.8 ?Weight today: 281.7 pounds ?Bowel Habits: Every day to every other day no complaints ?  ?Body Composition Scale 03/06/2022  ?Current Body Weight 281.7  ?Total Body Fat % 47.4  ?Visceral Fat 19  ?Fat-Free Mass % 52.5  ? Total Body Water % 40.7  ?Muscle-Mass lbs 32.8  ?BMI 41.4  ?Body Fat Displacement   ?       Torso  lbs 83.0  ?       Left Leg  lbs 16.6  ?       Right Leg  lbs 16.6  ?       Left Arm  lbs 8.3  ?       Right Arm   lbs 8.3  ? ? ?  ?The following the learning objectives were met by the patient during this course: ?Identifies Phase 3 (Soft, High Proteins) Dietary Goals and will begin from 2 weeks post-operatively to 2 months post-operatively ?Identifies appropriate sources of fluids and proteins  ?Identifies appropriate fat sources and healthy verses unhealthy fat types   ?States protein recommendations and appropriate sources post-operatively ?Identifies the need for appropriate texture modifications, mastication, and bite sizes when consuming solids ?Identifies appropriate fat consumption and sources ?Identifies appropriate multivitamin and calcium sources post-operatively ?Describes the need for physical activity post-operatively and will follow MD recommendations ?States when to call healthcare provider regarding medication questions or post-operative complications ?  ?Handouts given during class include: ?Phase 3A: Soft, High Protein Diet Handout ?Phase 3 High Protein Meals ?Healthy Fats ?  ?Follow-Up Plan: ?Patient will follow-up at [facility] in 6 weeks for 2 month post-op nutrition visit for diet advancement per MD. ? ?

## 2022-03-12 ENCOUNTER — Telehealth: Payer: Self-pay | Admitting: Skilled Nursing Facility1

## 2022-03-12 NOTE — Telephone Encounter (Signed)
RD called pt to verify fluid intake once starting soft, solid proteins 2 week post-bariatric surgery.  ? ?Daily Fluid intake: 50 oz ?Daily Protein intake: 60 g ?Bowel Habits: every 3 days with milk of magnesia  ? ?Concerns/issues:  ? ?Pt states she has learned to eat slower.  ?

## 2022-03-16 ENCOUNTER — Other Ambulatory Visit (HOSPITAL_COMMUNITY): Payer: Self-pay

## 2022-04-17 ENCOUNTER — Encounter: Payer: Medicare HMO | Attending: Surgery | Admitting: Skilled Nursing Facility1

## 2022-04-17 DIAGNOSIS — G8929 Other chronic pain: Secondary | ICD-10-CM | POA: Diagnosis not present

## 2022-04-17 DIAGNOSIS — E669 Obesity, unspecified: Secondary | ICD-10-CM

## 2022-04-17 DIAGNOSIS — I10 Essential (primary) hypertension: Secondary | ICD-10-CM | POA: Diagnosis not present

## 2022-04-17 DIAGNOSIS — Z713 Dietary counseling and surveillance: Secondary | ICD-10-CM | POA: Diagnosis not present

## 2022-04-17 DIAGNOSIS — M25562 Pain in left knee: Secondary | ICD-10-CM | POA: Diagnosis not present

## 2022-04-17 DIAGNOSIS — Z6839 Body mass index (BMI) 39.0-39.9, adult: Secondary | ICD-10-CM | POA: Diagnosis not present

## 2022-04-17 DIAGNOSIS — M21062 Valgus deformity, not elsewhere classified, left knee: Secondary | ICD-10-CM | POA: Diagnosis not present

## 2022-04-17 DIAGNOSIS — M1712 Unilateral primary osteoarthritis, left knee: Secondary | ICD-10-CM | POA: Diagnosis not present

## 2022-04-17 NOTE — Progress Notes (Signed)
Bariatric Nutrition Follow-Up Visit ?Medical Nutrition Therapy  ?Appt Start Time: 1:58   End Time: 2:30 ? ? ?NUTRITION ASSESSMENT ?  ? ?Anthropometrics  ?Surgery date: 02/19/2022 ?Surgery type: sleeve ?Start weight at [facility]: 294.8 ?Weight today: 270.5 pounds ? ?  ?Body Composition Scale 03/06/2022 04/17/2022  ?Current Body Weight 281.7 270.5  ?Total Body Fat % 47.4 46.5  ?Visceral Fat 19   ?Fat-Free Mass % 52.5   ? Total Body Water % 40.7 41.2  ?Muscle-Mass lbs 32.8   ?BMI 41.4 39.7  ?Body Fat Displacement    ?       Torso  lbs 83.0   ?       Left Leg  lbs 16.6   ?       Right Leg  lbs 16.6   ?       Left Arm  lbs 8.3   ?       Right Arm   lbs 8.3   ? ?Clinical  ?Medical hx: diverticulitis, HTN ?Medications: see list ?Labs: A1C 6.4 ?Notable signs/symptoms: diverticulitis flare: pain in abdomen, knee pain, back pain  ?Any previous deficiencies? Potassium  ?  ?Lifestyle & Dietary Hx ?Pt arrived tired and moving slow.  Pt states she did not sleep well the night before. ?Pt states she is taking Milk of M every other day ?Pt states she participated in water aerobics 3 days last week.  States it went really well. ?Pt states she had a cortisone shot in her knee today ?Pt states raw vegetables cause a flare up with her diverticulitis. ?Pt recognized that she should not have added gravy to her hamburger patty. ?Pt is doing really well, and is excited to add non-starchy vegetables ? ? ?Estimated daily fluid intake: 50 oz ?Estimated daily protein intake: 60+ g ?Supplements: multi vitamin and calcium ?Current average weekly physical activity: water aerobics 3 days last week  ? ?24-Hr Dietary Recall ?First Meal: plant based scrambled with egg whites or protein shake ?Snack: pecans 1/2 cup ?Second Meal: hamburger patty with gravy ?Snack: cheese sticks  ?Third Meal: chicken ?Snack: protein shake ?Beverages: Gatorade zero, water, propel, coffee (11 oz) ? ?Post-Op Goals/ Signs/ Symptoms ?Using straws: no ?Drinking while eating:  no ?Chewing/swallowing difficulties: no ?Changes in vision: no ?Changes to mood/headaches: no ?Hair loss/changes to skin/nails: no ?Difficulty focusing/concentrating: no ?Sweating: no ?Limb weakness: no ?Dizziness/lightheadedness: no ?Palpitations: no ?Carbonated/caffeinated beverages: no ?N/V/D/C/Gas: no ?Abdominal pain: no ?Dumping syndrome: no ? ?  ?NUTRITION DIAGNOSIS  ?Overweight/obesity ([place]-3.3) related to past poor dietary habits and physical inactivity as evidenced by completed bariatric surgery and following dietary guidelines for continued weight loss and healthy nutrition status. ?  ?  ?NUTRITION INTERVENTION ?Nutrition counseling (C-1) and education (E-2) to facilitate bariatric surgery goals, including: ?Diet advancement to the next phase (phase 4) now including non-starchy vegetables ?The importance of consuming adequate calories as well as certain nutrients daily due to the body's need for essential vitamins, minerals, and fats ?The importance of daily physical activity and to reach a goal of at least 150 minutes of moderate to vigorous physical activity weekly (or as directed by their physician) due to benefits such as increased musculature and improved lab values ?The importance of intuitive eating specifically learning hunger-satiety cues and understanding the importance of learning a new body: The importance of mindful eating to avoid grazing behaviors  ? ?-Continue to aim for a minimum of 64 fluid ounces 7 days a week with at least 30 ounces being plain water ? ?-  Eat non-starchy vegetables 2 times a day 7 days a week ? ?-Start out with soft cooked vegetables today and tomorrow; if tolerated begin to eat raw vegetables or cooked including salads ? ?-Eat your 3 ounces of protein first then start in on your non-starchy vegetables; once you understand how much of your meal leads to satisfaction and not full while still eating 3 ounces of protein and non-starchy vegetables you can eat them in any  order  ? ?-Continue to aim for 30 minutes of activity at least 5 times a week ? ?-Do NOT cook with/add to your food: alfredo sauce, cheese sauce, barbeque sauce, ketchup, fat back, butter, bacon grease, grease, Crisco, OR SUGAR  ? ?Handouts Provided Include  ?Phase 4 ? ?Learning Style & Readiness for Change ?Teaching method utilized: Visual & Auditory  ?Demonstrated degree of understanding via: Teach Back  ?Readiness Level: Ready ?Barriers to learning/adherence to lifestyle change: non identified ? ?RD's Notes for Next Visit ?Assess adherence to pt chosen goals ? ? ?MONITORING & EVALUATION ?Dietary intake, weekly physical activity, body weight ? ?Next Steps ?Patient is to follow-up in July, for 6 month post-op follow-up. ?

## 2022-04-18 DIAGNOSIS — M21062 Valgus deformity, not elsewhere classified, left knee: Secondary | ICD-10-CM | POA: Diagnosis not present

## 2022-04-18 DIAGNOSIS — M1712 Unilateral primary osteoarthritis, left knee: Secondary | ICD-10-CM | POA: Diagnosis not present

## 2022-04-18 DIAGNOSIS — M25562 Pain in left knee: Secondary | ICD-10-CM | POA: Diagnosis not present

## 2022-04-18 DIAGNOSIS — R2689 Other abnormalities of gait and mobility: Secondary | ICD-10-CM | POA: Diagnosis not present

## 2022-04-18 DIAGNOSIS — G8929 Other chronic pain: Secondary | ICD-10-CM | POA: Diagnosis not present

## 2022-04-18 DIAGNOSIS — M25662 Stiffness of left knee, not elsewhere classified: Secondary | ICD-10-CM | POA: Diagnosis not present

## 2022-04-18 DIAGNOSIS — M6281 Muscle weakness (generalized): Secondary | ICD-10-CM | POA: Diagnosis not present

## 2022-04-26 DIAGNOSIS — M21062 Valgus deformity, not elsewhere classified, left knee: Secondary | ICD-10-CM | POA: Diagnosis not present

## 2022-04-26 DIAGNOSIS — M25562 Pain in left knee: Secondary | ICD-10-CM | POA: Diagnosis not present

## 2022-04-26 DIAGNOSIS — M1712 Unilateral primary osteoarthritis, left knee: Secondary | ICD-10-CM | POA: Diagnosis not present

## 2022-05-03 DIAGNOSIS — M25562 Pain in left knee: Secondary | ICD-10-CM | POA: Diagnosis not present

## 2022-05-03 DIAGNOSIS — M21062 Valgus deformity, not elsewhere classified, left knee: Secondary | ICD-10-CM | POA: Diagnosis not present

## 2022-05-03 DIAGNOSIS — M1712 Unilateral primary osteoarthritis, left knee: Secondary | ICD-10-CM | POA: Diagnosis not present

## 2022-05-08 DIAGNOSIS — M1712 Unilateral primary osteoarthritis, left knee: Secondary | ICD-10-CM | POA: Diagnosis not present

## 2022-05-08 DIAGNOSIS — M21062 Valgus deformity, not elsewhere classified, left knee: Secondary | ICD-10-CM | POA: Diagnosis not present

## 2022-05-08 DIAGNOSIS — M25562 Pain in left knee: Secondary | ICD-10-CM | POA: Diagnosis not present

## 2022-05-10 DIAGNOSIS — M2100 Valgus deformity, not elsewhere classified, unspecified site: Secondary | ICD-10-CM | POA: Diagnosis not present

## 2022-05-10 DIAGNOSIS — M25562 Pain in left knee: Secondary | ICD-10-CM | POA: Diagnosis not present

## 2022-05-10 DIAGNOSIS — M6281 Muscle weakness (generalized): Secondary | ICD-10-CM | POA: Diagnosis not present

## 2022-05-10 DIAGNOSIS — M1712 Unilateral primary osteoarthritis, left knee: Secondary | ICD-10-CM | POA: Diagnosis not present

## 2022-05-10 DIAGNOSIS — R2689 Other abnormalities of gait and mobility: Secondary | ICD-10-CM | POA: Diagnosis not present

## 2022-06-05 ENCOUNTER — Telehealth: Payer: Self-pay | Admitting: Skilled Nursing Facility1

## 2022-06-05 NOTE — Telephone Encounter (Signed)
Returned pts call stating protein shakes make her feel like puking.   Dietitian asked was there a particular reason she was continuing to drink 2 protein shakes a day because according to her note she was eating really well? Pt states you (the dietitian)told me to drink 2 proteins shakes a day. Dietitian cleared this up for the pt and advised due to her tolerance of all protein foods and a healthy appetite she did not need to drink any protein shakes.   Pt relieved to know she does not have to drink them.

## 2022-06-12 DIAGNOSIS — I10 Essential (primary) hypertension: Secondary | ICD-10-CM | POA: Diagnosis not present

## 2022-06-12 DIAGNOSIS — R7303 Prediabetes: Secondary | ICD-10-CM | POA: Diagnosis not present

## 2022-06-19 DIAGNOSIS — Z1231 Encounter for screening mammogram for malignant neoplasm of breast: Secondary | ICD-10-CM | POA: Diagnosis not present

## 2022-06-20 DIAGNOSIS — Z9884 Bariatric surgery status: Secondary | ICD-10-CM | POA: Diagnosis not present

## 2022-07-03 ENCOUNTER — Ambulatory Visit: Payer: Medicare HMO | Admitting: Skilled Nursing Facility1

## 2022-07-05 DIAGNOSIS — U071 COVID-19: Secondary | ICD-10-CM | POA: Diagnosis not present

## 2022-07-28 DIAGNOSIS — R1084 Generalized abdominal pain: Secondary | ICD-10-CM | POA: Diagnosis not present

## 2022-07-30 DIAGNOSIS — I10 Essential (primary) hypertension: Secondary | ICD-10-CM | POA: Diagnosis not present

## 2022-07-30 DIAGNOSIS — R300 Dysuria: Secondary | ICD-10-CM | POA: Diagnosis not present

## 2022-07-30 DIAGNOSIS — K5730 Diverticulosis of large intestine without perforation or abscess without bleeding: Secondary | ICD-10-CM | POA: Diagnosis not present

## 2022-07-30 DIAGNOSIS — R102 Pelvic and perineal pain: Secondary | ICD-10-CM | POA: Diagnosis not present

## 2022-07-30 DIAGNOSIS — R319 Hematuria, unspecified: Secondary | ICD-10-CM | POA: Diagnosis not present

## 2022-07-30 DIAGNOSIS — R1032 Left lower quadrant pain: Secondary | ICD-10-CM | POA: Diagnosis not present

## 2022-07-31 ENCOUNTER — Ambulatory Visit: Payer: Medicare HMO

## 2022-08-07 ENCOUNTER — Encounter: Payer: Self-pay | Admitting: Dietician

## 2022-08-07 ENCOUNTER — Encounter: Payer: Medicare HMO | Attending: Internal Medicine | Admitting: Dietician

## 2022-08-07 DIAGNOSIS — E669 Obesity, unspecified: Secondary | ICD-10-CM | POA: Insufficient documentation

## 2022-08-07 NOTE — Progress Notes (Signed)
Bariatric Nutrition Follow-Up Visit Medical Nutrition Therapy   NUTRITION ASSESSMENT  Anthropometrics  Surgery date: 02/19/2022 Surgery type: sleeve Start weight at [facility]: 294.8 Height: 69 in Weight today: 251.6 pounds    Body Composition Scale 03/06/2022 04/17/2022 07/28/2022  Current Body Weight 281.7 270.5 251.6  Total Body Fat % 47.4 46.5 44.8  Visceral Fat 19  16  Fat-Free Mass % 52.5  55.1   Total Body Water % 40.7 41.2 42.0  Muscle-Mass lbs 32.8  32.5  BMI 41.4 39.7 36.9  Body Fat Displacement            Torso  lbs 83.0  70.0         Left Leg  lbs 16.6  14.0         Right Leg  lbs 16.6  14.0         Left Arm  lbs 8.3  7.0         Right Arm   lbs 8.3  7.0   Clinical  Medical hx: diverticulitis, HTN Medications: see list Labs: A1C 6.4 Notable signs/symptoms: diverticulitis flare: pain in abdomen, knee pain, back pain  Any previous deficiencies? Potassium    Lifestyle & Dietary Hx  Pt arrived on time and had a funeral to go to right after appointment. Pt states she had COVID in July and states she has had a diverticulitis flare up recently, stating she has been on a soft diet, and introducing/advancing to other foods, to include rice, mashed potatoes, applesauce.  Pt states she is on a an antibiotic. PT states she is waiting a week or two to get back to water aerobics, stating since she has been sick she has not done much physical activity, but is walking some.  Pt states she want to get back to physical activity to increase her strength and endurance.   Estimated daily fluid intake: 64 oz Estimated daily protein intake: 60+ g Supplements: multi vitamin and calcium Current average weekly physical activity: ADLs, walking (pt has been sick with COVID and diverticulitis).  24-Hr Dietary Recall First Meal: shrimp, 1/3 cup of rice. Snack:  Second Meal: sweet baked potato, cinnamon, Splenda, plant based butter Snack:   Third Meal: chicken wings and veggie pizza Snack:   Beverages: Gatorade zero, water, un-sweet tea  Post-Op Goals/ Signs/ Symptoms Using straws: no Drinking while eating: no Chewing/swallowing difficulties: no Changes in vision: no Changes to mood/headaches: no Hair loss/changes to skin/nails: no Difficulty focusing/concentrating: no Sweating: no Limb weakness: no Dizziness/lightheadedness: no Palpitations: no Carbonated/caffeinated beverages: no N/V/D/C/Gas: no Abdominal pain: no Dumping syndrome: no    NUTRITION DIAGNOSIS  Overweight/obesity ([place]-3.3) related to past poor dietary habits and physical inactivity as evidenced by completed bariatric surgery and following dietary guidelines for continued weight loss and healthy nutrition status.     NUTRITION INTERVENTION Nutrition counseling (C-1) and education (E-2) to facilitate bariatric surgery goals, including: The importance of consuming adequate calories as well as certain nutrients daily due to the body's need for essential vitamins, minerals, and fats The importance of daily physical activity and to reach a goal of at least 150 minutes of moderate to vigorous physical activity weekly (or as directed by their physician) due to benefits such as increased musculature and improved lab values The importance of intuitive eating specifically learning hunger-satiety cues and understanding the importance of learning a new body: The importance of mindful eating to avoid grazing behaviors   -Continue to aim for a minimum of 64 fluid ounces 7  days a week with at least 30 ounces being plain water  -Eat your 3 ounces of protein first then start in on your non-starchy vegetables, and then complex carbohydrate; once you understand how much of your meal leads to satisfaction and not full while still eating 3 ounces of protein and non-starchy vegetables you can eat them in any order   -Continue to aim for 30 minutes of activity at least 5 times a week  -Do NOT cook with/add to your food:  alfredo sauce, cheese sauce, barbeque sauce, ketchup, fat back, butter, bacon grease, grease, Crisco, OR SUGAR   Handouts Provided Include  Bariatric MyPlate Meal Ideas Snack ideas (protein/complex carb)  Learning Style & Readiness for Change Teaching method utilized: Visual & Auditory  Demonstrated degree of understanding via: Teach Back  Readiness Level: Ready Barriers to learning/adherence to lifestyle change: non identified  RD's Notes for Next Visit Assess adherence to pt chosen goals   MONITORING & EVALUATION Dietary intake, weekly physical activity, body weight  Next Steps Patient is to follow-up in July, for 6 month post-op follow-up.

## 2022-08-15 DIAGNOSIS — K5792 Diverticulitis of intestine, part unspecified, without perforation or abscess without bleeding: Secondary | ICD-10-CM | POA: Diagnosis not present

## 2022-08-15 DIAGNOSIS — K5909 Other constipation: Secondary | ICD-10-CM | POA: Diagnosis not present

## 2022-08-15 DIAGNOSIS — R1032 Left lower quadrant pain: Secondary | ICD-10-CM | POA: Diagnosis not present

## 2022-08-16 DIAGNOSIS — K56609 Unspecified intestinal obstruction, unspecified as to partial versus complete obstruction: Secondary | ICD-10-CM | POA: Diagnosis not present

## 2022-08-21 ENCOUNTER — Encounter (HOSPITAL_COMMUNITY): Payer: Self-pay

## 2022-08-21 ENCOUNTER — Inpatient Hospital Stay (HOSPITAL_COMMUNITY)
Admission: EM | Admit: 2022-08-21 | Discharge: 2022-09-20 | DRG: 330 | Disposition: A | Discharge status: Home Health | Payer: Medicare HMO | Attending: General Surgery | Admitting: General Surgery

## 2022-08-21 ENCOUNTER — Other Ambulatory Visit: Payer: Self-pay

## 2022-08-21 DIAGNOSIS — K219 Gastro-esophageal reflux disease without esophagitis: Secondary | ICD-10-CM | POA: Diagnosis present

## 2022-08-21 DIAGNOSIS — M79605 Pain in left leg: Secondary | ICD-10-CM | POA: Diagnosis not present

## 2022-08-21 DIAGNOSIS — K56609 Unspecified intestinal obstruction, unspecified as to partial versus complete obstruction: Secondary | ICD-10-CM | POA: Diagnosis present

## 2022-08-21 DIAGNOSIS — Z882 Allergy status to sulfonamides status: Secondary | ICD-10-CM | POA: Diagnosis not present

## 2022-08-21 DIAGNOSIS — Z6836 Body mass index (BMI) 36.0-36.9, adult: Secondary | ICD-10-CM

## 2022-08-21 DIAGNOSIS — I10 Essential (primary) hypertension: Secondary | ICD-10-CM | POA: Diagnosis present

## 2022-08-21 DIAGNOSIS — Z4682 Encounter for fitting and adjustment of non-vascular catheter: Secondary | ICD-10-CM | POA: Diagnosis not present

## 2022-08-21 DIAGNOSIS — Z933 Colostomy status: Secondary | ICD-10-CM

## 2022-08-21 DIAGNOSIS — N739 Female pelvic inflammatory disease, unspecified: Secondary | ICD-10-CM | POA: Diagnosis not present

## 2022-08-21 DIAGNOSIS — K651 Peritoneal abscess: Secondary | ICD-10-CM | POA: Diagnosis not present

## 2022-08-21 DIAGNOSIS — Z79899 Other long term (current) drug therapy: Secondary | ICD-10-CM

## 2022-08-21 DIAGNOSIS — K56699 Other intestinal obstruction unspecified as to partial versus complete obstruction: Secondary | ICD-10-CM | POA: Diagnosis not present

## 2022-08-21 DIAGNOSIS — Z96651 Presence of right artificial knee joint: Secondary | ICD-10-CM | POA: Diagnosis present

## 2022-08-21 DIAGNOSIS — Z8249 Family history of ischemic heart disease and other diseases of the circulatory system: Secondary | ICD-10-CM | POA: Diagnosis not present

## 2022-08-21 DIAGNOSIS — R339 Retention of urine, unspecified: Secondary | ICD-10-CM | POA: Diagnosis not present

## 2022-08-21 DIAGNOSIS — Y838 Other surgical procedures as the cause of abnormal reaction of the patient, or of later complication, without mention of misadventure at the time of the procedure: Secondary | ICD-10-CM | POA: Diagnosis not present

## 2022-08-21 DIAGNOSIS — N3289 Other specified disorders of bladder: Secondary | ICD-10-CM | POA: Diagnosis not present

## 2022-08-21 DIAGNOSIS — Z888 Allergy status to other drugs, medicaments and biological substances status: Secondary | ICD-10-CM | POA: Diagnosis not present

## 2022-08-21 DIAGNOSIS — Z87891 Personal history of nicotine dependence: Secondary | ICD-10-CM

## 2022-08-21 DIAGNOSIS — D649 Anemia, unspecified: Secondary | ICD-10-CM | POA: Diagnosis not present

## 2022-08-21 DIAGNOSIS — D638 Anemia in other chronic diseases classified elsewhere: Secondary | ICD-10-CM | POA: Diagnosis present

## 2022-08-21 DIAGNOSIS — Y733 Surgical instruments, materials and gastroenterology and urology devices (including sutures) associated with adverse incidents: Secondary | ICD-10-CM | POA: Diagnosis not present

## 2022-08-21 DIAGNOSIS — Z9884 Bariatric surgery status: Secondary | ICD-10-CM

## 2022-08-21 DIAGNOSIS — K5730 Diverticulosis of large intestine without perforation or abscess without bleeding: Secondary | ICD-10-CM | POA: Diagnosis not present

## 2022-08-21 DIAGNOSIS — G629 Polyneuropathy, unspecified: Secondary | ICD-10-CM | POA: Diagnosis present

## 2022-08-21 DIAGNOSIS — R109 Unspecified abdominal pain: Secondary | ICD-10-CM | POA: Diagnosis not present

## 2022-08-21 DIAGNOSIS — G4733 Obstructive sleep apnea (adult) (pediatric): Secondary | ICD-10-CM | POA: Diagnosis present

## 2022-08-21 DIAGNOSIS — K5732 Diverticulitis of large intestine without perforation or abscess without bleeding: Secondary | ICD-10-CM | POA: Diagnosis not present

## 2022-08-21 DIAGNOSIS — R590 Localized enlarged lymph nodes: Secondary | ICD-10-CM | POA: Diagnosis not present

## 2022-08-21 DIAGNOSIS — I82451 Acute embolism and thrombosis of right peroneal vein: Secondary | ICD-10-CM | POA: Diagnosis not present

## 2022-08-21 DIAGNOSIS — R1032 Left lower quadrant pain: Secondary | ICD-10-CM | POA: Diagnosis present

## 2022-08-21 DIAGNOSIS — K5720 Diverticulitis of large intestine with perforation and abscess without bleeding: Secondary | ICD-10-CM | POA: Diagnosis not present

## 2022-08-21 DIAGNOSIS — I700 Atherosclerosis of aorta: Secondary | ICD-10-CM | POA: Diagnosis not present

## 2022-08-21 DIAGNOSIS — E876 Hypokalemia: Secondary | ICD-10-CM | POA: Diagnosis present

## 2022-08-21 DIAGNOSIS — K5939 Other megacolon: Secondary | ICD-10-CM | POA: Diagnosis not present

## 2022-08-21 DIAGNOSIS — D62 Acute posthemorrhagic anemia: Secondary | ICD-10-CM | POA: Diagnosis not present

## 2022-08-21 DIAGNOSIS — L7682 Other postprocedural complications of skin and subcutaneous tissue: Secondary | ICD-10-CM | POA: Diagnosis not present

## 2022-08-21 DIAGNOSIS — M170 Bilateral primary osteoarthritis of knee: Secondary | ICD-10-CM | POA: Diagnosis present

## 2022-08-21 DIAGNOSIS — K6389 Other specified diseases of intestine: Secondary | ICD-10-CM | POA: Diagnosis not present

## 2022-08-21 DIAGNOSIS — K567 Ileus, unspecified: Secondary | ICD-10-CM | POA: Diagnosis not present

## 2022-08-21 DIAGNOSIS — N281 Cyst of kidney, acquired: Secondary | ICD-10-CM | POA: Diagnosis not present

## 2022-08-21 DIAGNOSIS — R7303 Prediabetes: Secondary | ICD-10-CM | POA: Diagnosis present

## 2022-08-21 DIAGNOSIS — I82462 Acute embolism and thrombosis of left calf muscular vein: Secondary | ICD-10-CM | POA: Diagnosis not present

## 2022-08-21 DIAGNOSIS — Z809 Family history of malignant neoplasm, unspecified: Secondary | ICD-10-CM

## 2022-08-21 DIAGNOSIS — R509 Fever, unspecified: Secondary | ICD-10-CM | POA: Diagnosis not present

## 2022-08-21 DIAGNOSIS — B952 Enterococcus as the cause of diseases classified elsewhere: Secondary | ICD-10-CM | POA: Diagnosis present

## 2022-08-21 DIAGNOSIS — R188 Other ascites: Secondary | ICD-10-CM | POA: Diagnosis not present

## 2022-08-21 DIAGNOSIS — R1110 Vomiting, unspecified: Secondary | ICD-10-CM | POA: Diagnosis not present

## 2022-08-21 DIAGNOSIS — I96 Gangrene, not elsewhere classified: Secondary | ICD-10-CM | POA: Diagnosis not present

## 2022-08-21 DIAGNOSIS — M1990 Unspecified osteoarthritis, unspecified site: Secondary | ICD-10-CM | POA: Diagnosis present

## 2022-08-21 DIAGNOSIS — M79604 Pain in right leg: Secondary | ICD-10-CM | POA: Diagnosis not present

## 2022-08-21 DIAGNOSIS — I82403 Acute embolism and thrombosis of unspecified deep veins of lower extremity, bilateral: Secondary | ICD-10-CM | POA: Diagnosis not present

## 2022-08-21 DIAGNOSIS — K5792 Diverticulitis of intestine, part unspecified, without perforation or abscess without bleeding: Secondary | ICD-10-CM | POA: Diagnosis not present

## 2022-08-21 DIAGNOSIS — K3189 Other diseases of stomach and duodenum: Secondary | ICD-10-CM | POA: Diagnosis not present

## 2022-08-21 DIAGNOSIS — T8143XA Infection following a procedure, organ and space surgical site, initial encounter: Secondary | ICD-10-CM | POA: Diagnosis not present

## 2022-08-21 LAB — CBC WITH DIFFERENTIAL/PLATELET
Abs Immature Granulocytes: 0.03 10*3/uL (ref 0.00–0.07)
Basophils Absolute: 0 10*3/uL (ref 0.0–0.1)
Basophils Relative: 0 %
Eosinophils Absolute: 0.1 10*3/uL (ref 0.0–0.5)
Eosinophils Relative: 1 %
HCT: 37.1 % (ref 36.0–46.0)
Hemoglobin: 11.9 g/dL — ABNORMAL LOW (ref 12.0–15.0)
Immature Granulocytes: 0 %
Lymphocytes Relative: 23 %
Lymphs Abs: 1.9 10*3/uL (ref 0.7–4.0)
MCH: 28.5 pg (ref 26.0–34.0)
MCHC: 32.1 g/dL (ref 30.0–36.0)
MCV: 89 fL (ref 80.0–100.0)
Monocytes Absolute: 0.8 10*3/uL (ref 0.1–1.0)
Monocytes Relative: 9 %
Neutro Abs: 5.2 10*3/uL (ref 1.7–7.7)
Neutrophils Relative %: 67 %
Platelets: 234 10*3/uL (ref 150–400)
RBC: 4.17 MIL/uL (ref 3.87–5.11)
RDW: 14.3 % (ref 11.5–15.5)
WBC: 7.9 10*3/uL (ref 4.0–10.5)
nRBC: 0 % (ref 0.0–0.2)

## 2022-08-21 LAB — COMPREHENSIVE METABOLIC PANEL
ALT: 16 U/L (ref 0–44)
AST: 18 U/L (ref 15–41)
Albumin: 3.3 g/dL — ABNORMAL LOW (ref 3.5–5.0)
Alkaline Phosphatase: 55 U/L (ref 38–126)
Anion gap: 11 (ref 5–15)
BUN: 10 mg/dL (ref 8–23)
CO2: 25 mmol/L (ref 22–32)
Calcium: 9.2 mg/dL (ref 8.9–10.3)
Chloride: 103 mmol/L (ref 98–111)
Creatinine, Ser: 0.8 mg/dL (ref 0.44–1.00)
GFR, Estimated: >60 mL/min (ref >60)
Glucose, Bld: 92 mg/dL (ref 70–99)
Potassium: 3.6 mmol/L (ref 3.5–5.1)
Sodium: 139 mmol/L (ref 135–145)
Total Bilirubin: 0.5 mg/dL (ref 0.3–1.2)
Total Protein: 7.9 g/dL (ref 6.5–8.1)

## 2022-08-21 LAB — LIPASE, BLOOD: Lipase: 26 U/L (ref 11–51)

## 2022-08-21 NOTE — ED Triage Notes (Addendum)
Pt reports with lower abdominal pain. Pt is currently being treated for diverticulitis as of last Wednesday but reports not getting better and is now developing a fever. Pt had constipation and diarrhea today with a lot of mucus.

## 2022-08-22 ENCOUNTER — Emergency Department (HOSPITAL_COMMUNITY): Payer: Medicare HMO

## 2022-08-22 ENCOUNTER — Encounter (HOSPITAL_COMMUNITY): Payer: Self-pay

## 2022-08-22 DIAGNOSIS — I10 Essential (primary) hypertension: Secondary | ICD-10-CM | POA: Diagnosis present

## 2022-08-22 DIAGNOSIS — K219 Gastro-esophageal reflux disease without esophagitis: Secondary | ICD-10-CM | POA: Diagnosis present

## 2022-08-22 DIAGNOSIS — Z882 Allergy status to sulfonamides status: Secondary | ICD-10-CM | POA: Diagnosis not present

## 2022-08-22 DIAGNOSIS — Z888 Allergy status to other drugs, medicaments and biological substances status: Secondary | ICD-10-CM | POA: Diagnosis not present

## 2022-08-22 DIAGNOSIS — R7303 Prediabetes: Secondary | ICD-10-CM | POA: Diagnosis present

## 2022-08-22 DIAGNOSIS — Y838 Other surgical procedures as the cause of abnormal reaction of the patient, or of later complication, without mention of misadventure at the time of the procedure: Secondary | ICD-10-CM | POA: Diagnosis not present

## 2022-08-22 DIAGNOSIS — K56609 Unspecified intestinal obstruction, unspecified as to partial versus complete obstruction: Secondary | ICD-10-CM | POA: Diagnosis present

## 2022-08-22 DIAGNOSIS — D62 Acute posthemorrhagic anemia: Secondary | ICD-10-CM | POA: Diagnosis not present

## 2022-08-22 DIAGNOSIS — G629 Polyneuropathy, unspecified: Secondary | ICD-10-CM | POA: Diagnosis present

## 2022-08-22 DIAGNOSIS — Y733 Surgical instruments, materials and gastroenterology and urology devices (including sutures) associated with adverse incidents: Secondary | ICD-10-CM | POA: Diagnosis not present

## 2022-08-22 DIAGNOSIS — K5730 Diverticulosis of large intestine without perforation or abscess without bleeding: Secondary | ICD-10-CM | POA: Diagnosis not present

## 2022-08-22 DIAGNOSIS — Z8249 Family history of ischemic heart disease and other diseases of the circulatory system: Secondary | ICD-10-CM | POA: Diagnosis not present

## 2022-08-22 DIAGNOSIS — K5732 Diverticulitis of large intestine without perforation or abscess without bleeding: Secondary | ICD-10-CM | POA: Diagnosis present

## 2022-08-22 DIAGNOSIS — D638 Anemia in other chronic diseases classified elsewhere: Secondary | ICD-10-CM | POA: Diagnosis present

## 2022-08-22 DIAGNOSIS — K5720 Diverticulitis of large intestine with perforation and abscess without bleeding: Secondary | ICD-10-CM | POA: Diagnosis not present

## 2022-08-22 DIAGNOSIS — R1110 Vomiting, unspecified: Secondary | ICD-10-CM | POA: Diagnosis not present

## 2022-08-22 DIAGNOSIS — Z79899 Other long term (current) drug therapy: Secondary | ICD-10-CM | POA: Diagnosis not present

## 2022-08-22 DIAGNOSIS — M79604 Pain in right leg: Secondary | ICD-10-CM | POA: Diagnosis not present

## 2022-08-22 DIAGNOSIS — M79605 Pain in left leg: Secondary | ICD-10-CM | POA: Diagnosis not present

## 2022-08-22 DIAGNOSIS — R109 Unspecified abdominal pain: Secondary | ICD-10-CM | POA: Diagnosis not present

## 2022-08-22 DIAGNOSIS — N281 Cyst of kidney, acquired: Secondary | ICD-10-CM | POA: Diagnosis not present

## 2022-08-22 DIAGNOSIS — R509 Fever, unspecified: Secondary | ICD-10-CM | POA: Diagnosis not present

## 2022-08-22 DIAGNOSIS — K567 Ileus, unspecified: Secondary | ICD-10-CM | POA: Diagnosis not present

## 2022-08-22 DIAGNOSIS — I96 Gangrene, not elsewhere classified: Secondary | ICD-10-CM | POA: Diagnosis not present

## 2022-08-22 DIAGNOSIS — I82451 Acute embolism and thrombosis of right peroneal vein: Secondary | ICD-10-CM | POA: Diagnosis not present

## 2022-08-22 DIAGNOSIS — K56699 Other intestinal obstruction unspecified as to partial versus complete obstruction: Secondary | ICD-10-CM | POA: Diagnosis not present

## 2022-08-22 DIAGNOSIS — Z9884 Bariatric surgery status: Secondary | ICD-10-CM | POA: Diagnosis not present

## 2022-08-22 DIAGNOSIS — T8143XA Infection following a procedure, organ and space surgical site, initial encounter: Secondary | ICD-10-CM | POA: Diagnosis not present

## 2022-08-22 DIAGNOSIS — I82403 Acute embolism and thrombosis of unspecified deep veins of lower extremity, bilateral: Secondary | ICD-10-CM | POA: Diagnosis not present

## 2022-08-22 DIAGNOSIS — E876 Hypokalemia: Secondary | ICD-10-CM | POA: Diagnosis present

## 2022-08-22 DIAGNOSIS — R1032 Left lower quadrant pain: Secondary | ICD-10-CM | POA: Diagnosis present

## 2022-08-22 DIAGNOSIS — Z6836 Body mass index (BMI) 36.0-36.9, adult: Secondary | ICD-10-CM | POA: Diagnosis not present

## 2022-08-22 DIAGNOSIS — I82462 Acute embolism and thrombosis of left calf muscular vein: Secondary | ICD-10-CM | POA: Diagnosis not present

## 2022-08-22 DIAGNOSIS — Z87891 Personal history of nicotine dependence: Secondary | ICD-10-CM | POA: Diagnosis not present

## 2022-08-22 DIAGNOSIS — Z96651 Presence of right artificial knee joint: Secondary | ICD-10-CM | POA: Diagnosis present

## 2022-08-22 DIAGNOSIS — I700 Atherosclerosis of aorta: Secondary | ICD-10-CM | POA: Diagnosis not present

## 2022-08-22 LAB — URINALYSIS, ROUTINE W REFLEX MICROSCOPIC
Bacteria, UA: NONE SEEN
Bilirubin Urine: NEGATIVE
Glucose, UA: NEGATIVE mg/dL
Ketones, ur: 5 mg/dL — AB
Nitrite: NEGATIVE
Protein, ur: 30 mg/dL — AB
Specific Gravity, Urine: 1.03 (ref 1.005–1.030)
pH: 7 (ref 5.0–8.0)

## 2022-08-22 LAB — CBC
HCT: 34.2 % — ABNORMAL LOW (ref 36.0–46.0)
Hemoglobin: 10.7 g/dL — ABNORMAL LOW (ref 12.0–15.0)
MCH: 28.5 pg (ref 26.0–34.0)
MCHC: 31.3 g/dL (ref 30.0–36.0)
MCV: 91.2 fL (ref 80.0–100.0)
Platelets: 217 10*3/uL (ref 150–400)
RBC: 3.75 MIL/uL — ABNORMAL LOW (ref 3.87–5.11)
RDW: 14.3 % (ref 11.5–15.5)
WBC: 7.3 10*3/uL (ref 4.0–10.5)
nRBC: 0 % (ref 0.0–0.2)

## 2022-08-22 LAB — CREATININE, SERUM
Creatinine, Ser: 0.79 mg/dL (ref 0.44–1.00)
GFR, Estimated: >60 mL/min (ref >60)

## 2022-08-22 LAB — HIV ANTIBODY (ROUTINE TESTING W REFLEX): HIV Screen 4th Generation wRfx: NONREACTIVE

## 2022-08-22 MED ORDER — POLYETHYLENE GLYCOL 3350 17 G PO PACK
17.0000 g | PACK | Freq: Two times a day (BID) | ORAL | Status: DC
  Administered 2022-08-22 – 2022-08-30 (×13): 17 g via ORAL
  Filled 2022-08-22 (×16): qty 1

## 2022-08-22 MED ORDER — HYDROMORPHONE HCL 2 MG/ML IJ SOLN: 0.5000 mg | INTRAMUSCULAR | Status: DC | PRN

## 2022-08-22 MED ORDER — ACETAMINOPHEN 325 MG PO TABS
650.0000 mg | ORAL_TABLET | Freq: Four times a day (QID) | ORAL | Status: DC | PRN
Start: 2022-08-22 — End: 2022-08-31
  Administered 2022-08-22 – 2022-08-30 (×5): 650 mg via ORAL
  Filled 2022-08-22 (×5): qty 2

## 2022-08-22 MED ORDER — CLONIDINE HCL 0.1 MG PO TABS
0.1000 mg | ORAL_TABLET | Freq: Every day | ORAL | Status: DC
  Administered 2022-08-22 – 2022-09-20 (×21): 0.1 mg via ORAL
  Filled 2022-08-22 (×26): qty 1

## 2022-08-22 MED ORDER — PNEUMOCOCCAL 20-VAL CONJ VACC 0.5 ML IM SUSY
0.5000 mL | PREFILLED_SYRINGE | INTRAMUSCULAR | Status: DC
  Filled 2022-08-22: qty 0.5

## 2022-08-22 MED ORDER — METRONIDAZOLE 500 MG/100ML IV SOLN
500.0000 mg | Freq: Once | INTRAVENOUS | Status: AC
Start: 2022-08-22 — End: 2022-08-22
  Administered 2022-08-22: 500 mg via INTRAVENOUS
  Filled 2022-08-22: qty 100

## 2022-08-22 MED ORDER — METRONIDAZOLE 500 MG/100ML IV SOLN
500.0000 mg | Freq: Two times a day (BID) | INTRAVENOUS | Status: AC
  Administered 2022-08-22 – 2022-09-04 (×27): 500 mg via INTRAVENOUS
  Filled 2022-08-22 (×27): qty 100

## 2022-08-22 MED ORDER — LACTATED RINGERS IV SOLN: INTRAVENOUS | Status: AC

## 2022-08-22 MED ORDER — PANTOPRAZOLE SODIUM 40 MG PO TBEC
40.0000 mg | DELAYED_RELEASE_TABLET | Freq: Every day | ORAL | Status: DC
Start: 2022-08-22 — End: 2022-09-04
  Administered 2022-08-22 – 2022-09-04 (×13): 40 mg via ORAL
  Filled 2022-08-22 (×13): qty 1

## 2022-08-22 MED ORDER — SACCHAROMYCES BOULARDII 250 MG PO CAPS
250.0000 mg | ORAL_CAPSULE | Freq: Two times a day (BID) | ORAL | Status: DC
Start: 2022-08-22 — End: 2022-09-02
  Administered 2022-08-22 – 2022-09-02 (×18): 250 mg via ORAL
  Filled 2022-08-22 (×21): qty 1

## 2022-08-22 MED ORDER — DIPHENHYDRAMINE HCL 50 MG/ML IJ SOLN
12.5000 mg | Freq: Four times a day (QID) | INTRAMUSCULAR | Status: DC | PRN
  Administered 2022-08-22 – 2022-09-16 (×8): 12.5 mg via INTRAVENOUS
  Filled 2022-08-22 (×7): qty 1

## 2022-08-22 MED ORDER — DOCUSATE SODIUM 100 MG PO CAPS
100.0000 mg | ORAL_CAPSULE | Freq: Two times a day (BID) | ORAL | Status: DC
  Administered 2022-08-22 – 2022-08-25 (×7): 100 mg via ORAL
  Filled 2022-08-22 (×7): qty 1

## 2022-08-22 MED ORDER — AMLODIPINE BESYLATE 10 MG PO TABS
10.0000 mg | ORAL_TABLET | Freq: Every day | ORAL | Status: DC
  Administered 2022-08-22 – 2022-09-20 (×22): 10 mg via ORAL
  Filled 2022-08-22 (×18): qty 1
  Filled 2022-08-22: qty 2
  Filled 2022-08-22 (×9): qty 1

## 2022-08-22 MED ORDER — DIPHENHYDRAMINE HCL 50 MG/ML IJ SOLN: 25.0000 mg | Freq: Four times a day (QID) | INTRAMUSCULAR | Status: DC | PRN

## 2022-08-22 MED ORDER — ADULT MULTIVITAMIN W/MINERALS CH
1.0000 | ORAL_TABLET | Freq: Every day | ORAL | Status: DC
  Administered 2022-08-22 – 2022-09-03 (×12): 1 via ORAL
  Filled 2022-08-22 (×12): qty 1

## 2022-08-22 MED ORDER — PROCHLORPERAZINE EDISYLATE 10 MG/2ML IJ SOLN
10.0000 mg | Freq: Four times a day (QID) | INTRAMUSCULAR | Status: DC | PRN
  Administered 2022-08-22 – 2022-08-30 (×5): 10 mg via INTRAVENOUS
  Filled 2022-08-22 (×5): qty 2

## 2022-08-22 MED ORDER — LACTATED RINGERS IV BOLUS
1000.0000 mL | Freq: Once | INTRAVENOUS | Status: AC
  Administered 2022-08-22: 1000 mL via INTRAVENOUS

## 2022-08-22 MED ORDER — POLYETHYLENE GLYCOL 3350 17 G PO PACK
17.0000 g | PACK | Freq: Every day | ORAL | Status: DC | PRN
  Administered 2022-08-22: 17 g via ORAL
  Filled 2022-08-22: qty 1

## 2022-08-22 MED ORDER — FENTANYL CITRATE PF 50 MCG/ML IJ SOSY
12.5000 ug | PREFILLED_SYRINGE | INTRAMUSCULAR | Status: DC | PRN
  Administered 2022-08-22 – 2022-08-23 (×5): 25 ug via INTRAVENOUS
  Filled 2022-08-22 (×5): qty 1

## 2022-08-22 MED ORDER — IOHEXOL 300 MG/ML  SOLN
100.0000 mL | Freq: Once | INTRAMUSCULAR | Status: AC | PRN
  Administered 2022-08-22: 100 mL via INTRAVENOUS

## 2022-08-22 MED ORDER — SODIUM CHLORIDE (PF) 0.9 % IJ SOLN
INTRAMUSCULAR | Status: AC
  Filled 2022-08-22: qty 50

## 2022-08-22 MED ORDER — MELATONIN 5 MG PO TABS
5.0000 mg | ORAL_TABLET | Freq: Every evening | ORAL | Status: DC | PRN
  Administered 2022-09-09: 5 mg via ORAL
  Filled 2022-08-22 (×2): qty 1

## 2022-08-22 MED ORDER — SODIUM CHLORIDE 0.9 % IV SOLN
2.0000 g | Freq: Once | INTRAVENOUS | Status: AC
  Administered 2022-08-22: 2 g via INTRAVENOUS
  Filled 2022-08-22: qty 20

## 2022-08-22 MED ORDER — ENOXAPARIN SODIUM 40 MG/0.4ML IJ SOSY
40.0000 mg | PREFILLED_SYRINGE | INTRAMUSCULAR | Status: DC
  Administered 2022-08-22 – 2022-08-30 (×9): 40 mg via SUBCUTANEOUS
  Filled 2022-08-22 (×9): qty 0.4

## 2022-08-22 MED ORDER — OXYCODONE HCL 5 MG PO TABS
5.0000 mg | ORAL_TABLET | Freq: Four times a day (QID) | ORAL | Status: DC | PRN
  Administered 2022-08-22: 5 mg via ORAL
  Filled 2022-08-22: qty 1

## 2022-08-22 MED ORDER — CEFTRIAXONE SODIUM 2 G IJ SOLR
2.0000 g | INTRAMUSCULAR | Status: DC
  Administered 2022-08-23 – 2022-08-26 (×4): 2 g via INTRAVENOUS
  Filled 2022-08-22 (×4): qty 20

## 2022-08-22 MED ORDER — MORPHINE SULFATE (PF) 4 MG/ML IV SOLN
4.0000 mg | INTRAVENOUS | Status: AC | PRN
  Administered 2022-08-22 (×3): 4 mg via INTRAVENOUS
  Filled 2022-08-22 (×3): qty 1

## 2022-08-22 NOTE — Progress Notes (Signed)
No charge note  Patient seen and examined this morning, admitted overnight, H&P reviewed and agree with the assessment and plan.  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  Work-up in the ED revealed sigmoid diverticulitis  She has been placed on antibiotics, continue.  Given recurrent diverticulitis and discussions in the past regarding colectomy I have consulted general surgery, appreciate input.  Scheduled Meds:  amLODipine  10 mg Oral Q supper   cloNIDine  0.1 mg Oral Q supper   enoxaparin (LOVENOX) injection  40 mg Subcutaneous Q24H   multivitamin with minerals  1 tablet Oral Daily   pantoprazole  40 mg Oral Daily   sodium chloride (PF)       Continuous Infusions:  [START ON 08/23/2022] cefTRIAXone (ROCEPHIN)  IV     lactated ringers 50 mL/hr at 08/22/22 0424   metronidazole     PRN Meds:.acetaminophen, HYDROmorphone (DILAUDID) injection, melatonin, morphine injection, oxyCODONE, polyethylene glycol, prochlorperazine, sodium chloride (PF)  Gurpreet Mariani M. Elvera Lennox, MD, PhD [facility]  Between 7 am - 7 pm you can contact me via Amion (for emergencies) or Securechat (non urgent matters).  I am not available 7 pm - 7 am, please contact night coverage MD/APP via Amion

## 2022-08-22 NOTE — ED Provider Notes (Signed)
[facility]-EMERGENCY DEPT Provider Note   CSN: [id number] Arrival date & time: 08/21/22  2210     History  Chief Complaint  Patient presents with   Abdominal Pain    [facility] is a 66 y.o. female.  The history is provided by the patient.  Abdominal Pain She has history of hypertension, prediabetes, diverticulitis and comes in with worsening left lower quadrant pain.  She was initially diagnosed with diverticulitis on 07/28/2022 and treated with ciprofloxacin and metronidazole.  There was some brief improvement but symptoms recurred and she was started on amoxicillin-clavulanic acid on 8/23.  In spite of the change in antibiotics, pain has been getting worse.  She has run low-grade fevers at home as high as 100.2.  She denies any chills or sweats.  She denies any nausea or vomiting.  She had been constipated and had taken a laxative and has had some mucousy bowel movements since then.  Of note, appetite has been decreased and she has not been eating or drinking very much.   Home Medications Prior to Admission medications   Medication Sig Start Date End Date Taking? Authorizing Provider  acetaminophen (TYLENOL) 500 MG tablet Take 1,000 mg by mouth daily as needed for mild pain. pain     [provider]  Acetylcysteine (NAC) 500 MG CAPS Take 500 mg by mouth in the morning and at bedtime.    [provider]  amLODipine (NORVASC) 10 MG tablet Take 10 mg by mouth daily with supper. 08/08/17   [provider]  amoxicillin-clavulanate (AUGMENTIN) 875-125 MG tablet Take 1 tablet by mouth 2 (two) times daily. 08/17/22   [provider]  cloNIDine (CATAPRES) 0.1 MG tablet Take 0.1 mg by mouth daily with supper. 10/13/18   [provider]  dicyclomine (BENTYL) 20 MG tablet Take 20 mg by mouth every 8 (eight) hours. 12/05/16   [provider]  gabapentin (NEURONTIN) 100 MG capsule Take 2 capsules (200 mg total) by  mouth every 12 (twelve) hours. 02/20/22   Berna Bue, MD  methocarbamol (ROBAXIN) 500 MG tablet Take 500 mg by mouth every 8 (eight) hours as needed for muscle spasms.    [provider]  Multiple Vitamin (MULTIVITAMIN) tablet Take 1 tablet by mouth daily.    [provider]  ondansetron (ZOFRAN-ODT) 4 MG disintegrating tablet Take 1 tablet (4 mg total) by mouth every 6 (six) hours as needed for nausea or vomiting. 02/20/22   Berna Bue, MD  pantoprazole (PROTONIX) 40 MG tablet Take 40 mg by mouth daily with supper.    [provider]  pantoprazole (PROTONIX) 40 MG tablet Take 1 tablet (40 mg total) by mouth daily. 02/20/22   Berna Bue, MD  polyethylene glycol (MIRALAX / GLYCOLAX) 17 g packet Take 17 g by mouth daily as needed (constipation).    [provider]  Probiotic CAPS Take 1 capsule by mouth daily. 06/28/20   Ward, Layla Maw, DO  tizanidine (ZANAFLEX) 2 MG capsule Take 2 mg by mouth every 8 (eight) hours as needed for muscle spasms.    [provider]  traMADol (ULTRAM) 50 MG tablet Take 1 tablet (50 mg total) by mouth every 6 (six) hours as needed (pain). 02/20/22   Berna Bue, MD      Allergies    Benazepril, Sulfa antibiotics, and Elemental sulfur    Review of Systems   Review of Systems  Gastrointestinal:  Positive for abdominal pain.  All other systems reviewed and are negative.   Physical Exam Updated Vital Signs BP (!) 147/100   Pulse 73   Temp 98.6 F (37 C)   Resp 13   Ht 5\' 9"  (1.753 m)   Wt 112.5 kg   SpO2 98%   BMI 36.62 kg/m  Physical Exam Vitals and nursing note reviewed.   66 year old female, resting comfortably and in no acute distress. Vital signs are significant for elevated blood pressure. Oxygen saturation is 98%, which is normal. Head is normocephalic and atraumatic. PERRLA, EOMI. Oropharynx is clear. Neck is nontender and supple without adenopathy or JVD. Back is nontender and  there is no CVA tenderness. Lungs are clear without rales, wheezes, or rhonchi. Chest is nontender. Heart has regular rate and rhythm without murmur. Abdomen is soft, flat, with moderate left lower quadrant and severe suprapubic tenderness.  There is rebound tenderness present but no voluntary guarding. Extremities have no cyanosis or edema, full range of motion is present. Skin is warm and dry without rash. Neurologic: Mental status is normal, cranial nerves are intact, moves all extremities equally.  ED Results / Procedures / Treatments   Labs (all labs ordered are listed, but only abnormal results are displayed) Labs Reviewed  COMPREHENSIVE METABOLIC PANEL - Abnormal; Notable for the following components:      Result Value   Albumin 3.3 (*)    All other components within normal limits  CBC WITH DIFFERENTIAL/PLATELET - Abnormal; Notable for the following components:   Hemoglobin 11.9 (*)    All other components within normal limits  LIPASE, BLOOD  URINALYSIS, ROUTINE W REFLEX MICROSCOPIC   Radiology CT ABDOMEN PELVIS W CONTRAST  Result Date: 08/22/2022 CLINICAL DATA:  Left lower quadrant abdominal pain. EXAM: CT ABDOMEN AND PELVIS WITH CONTRAST TECHNIQUE: Multidetector CT imaging of the abdomen and pelvis was performed using the standard protocol following bolus administration of intravenous contrast. RADIATION DOSE REDUCTION: This exam was performed according to the departmental dose-optimization program which includes automated exposure control, adjustment of the mA and/or kV according to patient size and/or use of iterative reconstruction technique. CONTRAST:  08/24/2022 OMNIPAQUE IOHEXOL 300 MG/ML  SOLN COMPARISON:  06/28/2020. FINDINGS: Lower chest: The heart is normal in size and there is a small pericardial effusion. Mild atelectasis or scarring is present at the lung bases. Hepatobiliary: No focal liver abnormality is seen. No gallstones, gallbladder wall thickening, or biliary  dilatation. Pancreas: Unremarkable. No pancreatic ductal dilatation or surrounding inflammatory changes. Spleen: Normal in size without focal abnormality. Adrenals/Urinary Tract: No adrenal nodule or mass. The kidneys enhance symmetrically. Multiple renal cysts are noted bilaterally. No renal calculus or hydronephrosis. The bladder is unremarkable. Stomach/Bowel: There is a small hiatal hernia. Gastric surgery changes are noted. A moderate amount of retained stool is present in the colon. Multiple scattered diverticula are present along the sigmoid colon with bowel wall thickening and surrounding fat stranding, with interval worsening in appearance from the prior exam. No free air or abscess is seen. No bowel obstruction. The appendix is not seen. Vascular/Lymphatic: Aortic atherosclerotic asses. Prominent lymph nodes are noted in the periaortic space and in the mesentery in the pelvis which are new from the previous exam and are likely reactive. Reproductive: Status post hysterectomy. No adnexal masses. Other: No ascites. Musculoskeletal: Degenerative changes are present in the thoracolumbar spine. Sclerosis is seen at the sacroiliac joints bilaterally, compatible with sacroiliitis. No acute osseous abnormality. IMPRESSION: 1. Interval worsening of sigmoid diverticulitis. No free air or  abscess. 2. Interval development of prominent lymph nodes in the retroperitoneum and mesentery in the pelvis, likely reactive. 3. Moderate amount of retained stool in the colon suggesting constipation. 4. Aortic atherosclerosis. 5. Remaining incidental findings as described above. Electronically Signed   By: Thornell Sartorius M.D.   On: 08/22/2022 02:04    Procedures Procedures    Medications Ordered in ED Medications - No data to display  ED Course/ Medical Decision Making/ A&P                           Medical Decision Making Amount and/or Complexity of Data Reviewed Labs: ordered. Radiology:  ordered.  Risk Prescription drug management. Decision regarding hospitalization.   Left lower quadrant and suprapubic pain and patient with current treatment for diverticulitis.  Findings are certainly consistent with worsening of diverticulitis.  Since she has failed 2 different courses of antibiotics as an outpatient, she will definitely need to be admitted.  I have ordered CT of abdomen and pelvis to evaluate for possible complication of diverticulitis such as perforation or abscess.  Old records are reviewed, and she did have an office visit on 8/5 at which time she was prescribed ciprofloxacin and metronidazole.  She had CT scans of abdomen and pelvis on 8/7 and 8/24 which showed sigmoid diverticulitis without complication.  She did have an office visit on 8/23 at which time antibiotic was changed to amoxicillin-clavulanic acid.  I have ordered IV fluids, morphine for pain, ceftriaxone and metronidazole for infection.  I have reviewed and interpreted her laboratory tests and my interpretation is borderline anemia, mildly decreased albumin which is likely secondary to nutrition, otherwise normal CBC and comprehensive metabolic panel.  CT scan shows worsening of diverticulitis without evidence of abscess or perforation.  She feels better following morphine for pain.  She will need to be admitted because of being a treatment failure of 2 different courses of antibiotics as an outpatient.  Case is discussed with Dr. Margo Aye of [facility], who agrees to admit the patient.  Final Clinical Impression(s) / ED Diagnoses Final diagnoses:  Diverticulitis of colon without hemorrhage  Normochromic normocytic anemia  Elevated blood pressure reading with diagnosis of hypertension    Rx / DC Orders ED Discharge Orders     None         Dione Booze, MD 08/22/22 0730

## 2022-08-22 NOTE — ED Notes (Signed)
Patient taken to CT at this time.

## 2022-08-22 NOTE — Consult Note (Signed)
[facility] Consult [facility]  Kristin AskewJeanne M Gibson July 08, 1956  [id number].    Requesting MD: Pamella Pertostin Gherghe Chief Complaint/Reason for Consult: recurrent diverticulitis  HPI:  Kristin PiperJeanne M Gibson is a 66 y.o. female PMH HTN, GERD, polyneuropathy, and recent gastric sleeve 02/19/2022 by Dr. Fredricka Bonineonnor, who [facility] with worsening abdominal pain. She reports a 10 year history of intermittent diverticulitis. Last bout was last year. States that this bout started about 2 months ago. She has been on a course of cipro/ flagyl followed by augmentin but continues to have pain. She reports mostly LLQ abdominal pain. Denies nausea, vomiting, fever, or chills. She has been constipated. She has tried milk of magnesia, suppositories, and enemas with minimal relief. She felt that her pain was worse yesterday so she decided to come to the ED. In the ED she was found to be hypertensive, otherwise VSS. WBC 7.9. CT scan showed interval worsening of sigmoid diverticulitis, no free air or abscess. Patient was started on IV rocephin/flagyl and admitted to the medical service. General surgery asked to see. Her last colonoscopy was 02/2017 [facility] Jos[facility] - I cannot see the results but she states she was told she did not need another for 10 years. She has seen Dr. Cliffton AstersWhite in our office in the past for her diverticulitis and was planning elective surgery, but she needed her knee replaced as well so she proceeded with this first.  Abdominal surgical history: tubal ligation, surgery for ectopic pregnancy, laparoscopic gastric sleeve  Anticoagulants: none Nonsmoker Denies alcohol or illicit drug use Employment: retired    Family History  Problem Relation Age of Onset   Hypertension Mother    Cancer Sister     Past Medical History:  Diagnosis Date   Arthritis    Back pain    Diverticulitis    GERD (gastroesophageal reflux disease)    Hypertension    Knee pain, chronic    PONV  (postoperative nausea and vomiting)    Pre-diabetes    Sleep apnea    cpap    Past Surgical History:  Procedure Laterality Date   ABDOMINAL HYSTERECTOMY     BREAST REDUCTION SURGERY     KNEE SURGERY     LAPAROSCOPIC GASTRIC SLEEVE RESECTION N/A 02/19/2022   Procedure: LAPAROSCOPIC GASTRIC SLEEVE RESECTION, HIATAL HERNIA REPAIR;  Surgeon: Berna Bueonnor, Chelsea A, MD;  Location: [facility];  Service: General;  Laterality: N/A;   right knee replacement      TUBAL LIGATION     UPPER GI ENDOSCOPY N/A 02/19/2022   Procedure: UPPER GI ENDOSCOPY;  Surgeon: Berna Bueonnor, Chelsea A, MD;  Location: [facility];  Service: General;  Laterality: N/A;    Social History:  reports that she has quit smoking. She has never used smokeless tobacco. She reports current alcohol use. She reports that she does not use drugs.  Allergies:  Allergies  Allergen Reactions   Benazepril Swelling   Sulfa Antibiotics Nausea Only   Elemental Sulfur Nausea Only    (Not in a hospital admission)   Prior to Admission medications   Medication Sig Start Date End Date Taking? Authorizing Provider  acetaminophen (TYLENOL) 500 MG tablet Take 1,000 mg by mouth daily as needed for mild pain. pain    Yes [provider]  amLODipine (NORVASC) 10 MG tablet Take 10 mg by mouth daily with supper. 08/08/17  Yes [provider]  amoxicillin (AMOXIL) 500 MG capsule Take 2,000 mg by mouth once.   Yes [provider]  amoxicillin-clavulanate (AUGMENTIN) 875-125 MG tablet Take 1 tablet by mouth 2 (two) times daily. 08/17/22  Yes [provider]  cloNIDine (CATAPRES) 0.1 MG tablet Take 0.1 mg by mouth daily with supper. 10/13/18  Yes [provider]  dicyclomine (BENTYL) 20 MG tablet Take 20 mg by mouth every 8 (eight) hours. 12/05/16  Yes [provider]  methocarbamol (ROBAXIN) 500 MG tablet Take 500 mg by mouth every 8 (eight) hours as needed for muscle spasms.   Yes [provider]  Multiple  Vitamin (MULTIVITAMIN) tablet Take 1 tablet by mouth daily.   Yes [provider]  ondansetron (ZOFRAN-ODT) 4 MG disintegrating tablet Take 1 tablet (4 mg total) by mouth every 6 (six) hours as needed for nausea or vomiting. 02/20/22  Yes Berna Bue, MD  pantoprazole (PROTONIX) 40 MG tablet Take 1 tablet (40 mg total) by mouth daily. 02/20/22  Yes Berna Bue, MD  polyethylene glycol (MIRALAX / GLYCOLAX) 17 g packet Take 17 g by mouth daily as needed (constipation).   Yes [provider]  Probiotic CAPS Take 1 capsule by mouth daily. 06/28/20  Yes Ward, Layla Maw, DO  tizanidine (ZANAFLEX) 2 MG capsule Take 2 mg by mouth every 8 (eight) hours as needed for muscle spasms.   Yes [provider]  traMADol (ULTRAM) 50 MG tablet Take 1 tablet (50 mg total) by mouth every 6 (six) hours as needed (pain). 02/20/22  Yes Berna Bue, MD  gabapentin (NEURONTIN) 100 MG capsule Take 2 capsules (200 mg total) by mouth every 12 (twelve) hours. Patient not taking: Reported on 08/22/2022 02/20/22   Phylliss Blakes A, MD    Blood pressure 129/74, pulse 94, temperature 98.8 F (37.1 C), resp. rate 18, height 5\' 9"  (1.753 m), weight 112.5 kg, SpO2 96 %. Physical Exam: General: pleasant, WD/WN female who is laying in bed in NAD HEENT: head is normocephalic, atraumatic.  Sclera are noninjected.  Pupils equal and round.  Ears and nose without any masses or lesions.  Mouth is pink and moist. Dentition fair Heart: regular, rate, and rhythm.  Normal s1,s2. No obvious murmurs, gallops, or rubs noted.  Palpable radial and pedal pulses bilaterally  Lungs: CTAB, no wheezes, rhonchi, or rales noted.  Respiratory effort nonlabored Abd: soft, mostly tender in the LLQ with mild LUQ and RUQ TTP without peritonitis. +BS, no masses, hernias, or organomegaly MS: no BUE/BLE edema, calves soft and nontender Skin: warm and dry with no masses, lesions, or rashes Psych: A&Ox4 with an appropriate  affect Neuro: MAEs, no gross motor or sensory deficits BUE/BLE  Results for orders placed or performed during the hospital encounter of 08/21/22 (from the past 48 hour(s))  Comprehensive metabolic panel     Status: Abnormal   Collection Time: 08/21/22 11:06 PM  Result Value Ref Range   Sodium 139 135 - 145 mmol/L   Potassium 3.6 3.5 - 5.1 mmol/L   Chloride 103 98 - 111 mmol/L   CO2 25 22 - 32 mmol/L   Glucose, Bld 92 70 - 99 mg/dL    Comment: Glucose reference range applies only to samples taken after fasting for at least 8 hours.   BUN 10 8 - 23 mg/dL   Creatinine, Ser 08/23/22 0.44 - 1.00 mg/dL   Calcium 9.2 8.9 - 2.94 mg/dL   Total Protein 7.9 6.5 - 8.1 g/dL   Albumin 3.3 (L) 3.5 - 5.0 g/dL   AST 18 15 - 41 U/L  ALT 16 0 - 44 U/L   Alkaline Phosphatase 55 38 - 126 U/L   Total Bilirubin 0.5 0.3 - 1.2 mg/dL   GFR, Estimated >93 >71 mL/min    Comment: (NOTE) Calculated using the CKD-EPI Creatinine Equation (2021)    Anion gap 11 5 - 15    Comment: Performed at [facility], [address], [place], [place] [address]  Lipase, blood     Status: None   Collection Time: 08/21/22 11:06 PM  Result Value Ref Range   Lipase 26 11 - 51 U/L    Comment: Performed at [facility], [address], [place], [place] [address]  CBC with Diff     Status: Abnormal   Collection Time: 08/21/22 11:06 PM  Result Value Ref Range   WBC 7.9 4.0 - 10.5 K/uL   RBC 4.17 3.87 - 5.11 MIL/uL   Hemoglobin 11.9 (L) 12.0 - 15.0 g/dL   HCT 17.5 10.2 - 58.5 %   MCV 89.0 80.0 - 100.0 fL   MCH 28.5 26.0 - 34.0 pg   MCHC 32.1 30.0 - 36.0 g/dL   RDW 27.7 82.4 - 23.5 %   Platelets 234 150 - 400 K/uL   nRBC 0.0 0.0 - 0.2 %   Neutrophils Relative % 67 %   Neutro Abs 5.2 1.7 - 7.7 K/uL   Lymphocytes Relative 23 %   Lymphs Abs 1.9 0.7 - 4.0 K/uL   Monocytes Relative 9 %   Monocytes Absolute 0.8 0.1 - 1.0 K/uL   Eosinophils Relative 1 %   Eosinophils Absolute 0.1 0.0 - 0.5  K/uL   Basophils Relative 0 %   Basophils Absolute 0.0 0.0 - 0.1 K/uL   Immature Granulocytes 0 %   Abs Immature Granulocytes 0.03 0.00 - 0.07 K/uL    Comment: Performed at [facility], [address] [place], [place] [address]  Urinalysis, Routine w reflex microscopic Urine, Clean Catch     Status: Abnormal   Collection Time: 08/22/22  2:21 AM  Result Value Ref Range   Color, Urine YELLOW YELLOW   APPearance CLEAR CLEAR   Specific Gravity, Urine 1.030 1.005 - 1.030   pH 7.0 5.0 - 8.0   Glucose, UA NEGATIVE NEGATIVE mg/dL   Hgb urine dipstick SMALL (A) NEGATIVE   Bilirubin Urine NEGATIVE NEGATIVE   Ketones, ur 5 (A) NEGATIVE mg/dL   Protein, ur 30 (A) NEGATIVE mg/dL   Nitrite NEGATIVE NEGATIVE   Leukocytes,Ua TRACE (A) NEGATIVE   RBC / HPF 0-5 0 - 5 RBC/hpf   WBC, UA 0-5 0 - 5 WBC/hpf   Bacteria, UA NONE SEEN NONE SEEN   Squamous Epithelial / LPF 0-5 0 - 5    Comment: Performed at [facility], [address], [place], [place] [address]  HIV Antibody (routine testing w rflx)     Status: None   Collection Time: 08/22/22  4:30 AM  Result Value Ref Range   HIV Screen 4th Generation wRfx Non Reactive Non Reactive    Comment: Performed at [facility], [address], [place], [place] [address]  CBC     Status: Abnormal   Collection Time: 08/22/22  4:30 AM  Result Value Ref Range   WBC 7.3 4.0 - 10.5 K/uL   RBC 3.75 (L) 3.87 - 5.11 MIL/uL   Hemoglobin 10.7 (L) 12.0 - 15.0 g/dL   HCT 19.5 (L) 09.3 - 26.7 %   MCV 91.2 80.0 -  100.0 fL   MCH 28.5 26.0 - 34.0 pg   MCHC 31.3 30.0 - 36.0 g/dL   RDW 10.2 58.5 - 27.7 %   Platelets 217 150 - 400 K/uL   nRBC 0.0 0.0 - 0.2 %    Comment: Performed at [facility], [address] [place], [place] [address]  Creatinine, serum     Status: None   Collection Time: 08/22/22  4:30 AM  Result Value Ref Range   Creatinine, Ser 0.79 0.44 - 1.00 mg/dL   GFR, Estimated >53 >61 mL/min     Comment: (NOTE) Calculated using the CKD-EPI Creatinine Equation (2021) Performed at [facility], [address], [place], [place] [address]    CT ABDOMEN PELVIS W CONTRAST  Result Date: 08/22/2022 CLINICAL DATA:  Left lower quadrant abdominal pain. EXAM: CT ABDOMEN AND PELVIS WITH CONTRAST TECHNIQUE: Multidetector CT imaging of the abdomen and pelvis was performed using the standard protocol following bolus administration of intravenous contrast. RADIATION DOSE REDUCTION: This exam was performed according to the departmental dose-optimization program which includes automated exposure control, adjustment of the mA and/or kV according to patient size and/or use of iterative reconstruction technique. CONTRAST:  OMNIPAQUE IOHEXOL 300 MG/ML  SOLN COMPARISON:  06/28/2020. FINDINGS: Lower chest: The heart is normal in size and there is a small pericardial effusion. Mild atelectasis or scarring is present at the lung bases. Hepatobiliary: No focal liver abnormality is seen. No gallstones, gallbladder wall thickening, or biliary dilatation. Pancreas: Unremarkable. No pancreatic ductal dilatation or surrounding inflammatory changes. Spleen: Normal in size without focal abnormality. Adrenals/Urinary Tract: No adrenal nodule or mass. The kidneys enhance symmetrically. Multiple renal cysts are noted bilaterally. No renal calculus or hydronephrosis. The bladder is unremarkable. Stomach/Bowel: There is a small hiatal hernia. Gastric surgery changes are noted. A moderate amount of retained stool is present in the colon. Multiple scattered diverticula are present along the sigmoid colon with bowel wall thickening and surrounding fat stranding, with interval worsening in appearance from the prior exam. No free air or abscess is seen. No bowel obstruction. The appendix is not seen. Vascular/Lymphatic: Aortic atherosclerotic asses. Prominent lymph nodes are noted in the periaortic space and in the  mesentery in the pelvis which are new from the previous exam and are likely reactive. Reproductive: Status post hysterectomy. No adnexal masses. Other: No ascites. Musculoskeletal: Degenerative changes are present in the thoracolumbar spine. Sclerosis is seen at the sacroiliac joints bilaterally, compatible with sacroiliitis. No acute osseous abnormality. IMPRESSION: 1. Interval worsening of sigmoid diverticulitis. No free air or abscess. 2. Interval development of prominent lymph nodes in the retroperitoneum and mesentery in the pelvis, likely reactive. 3. Moderate amount of retained stool in the colon suggesting constipation. 4. Aortic atherosclerosis. 5. Remaining incidental findings as described above. Electronically Signed   By: Thornell Sartorius M.D.   On: 08/22/2022 02:04    Anti-infectives (From admission, onward)    Start     Dose/Rate Route Frequency Ordered Stop   08/23/22 0400  cefTRIAXone (ROCEPHIN) 2 g in sodium chloride 0.9 % 100 mL IVPB        2 g 200 mL/hr over 30 Minutes Intravenous Every 24 hours 08/22/22 0345     08/22/22 1400  metroNIDAZOLE (FLAGYL) IVPB 500 mg        500 mg 100 mL/hr over 60 Minutes Intravenous Every 12 hours 08/22/22 0345     08/22/22 0100  cefTRIAXone (ROCEPHIN) 2 g in sodium chloride 0.9 %  100 mL IVPB        2 g 200 mL/hr over 30 Minutes Intravenous  Once 08/22/22 0047 08/22/22 0226   08/22/22 0100  metroNIDAZOLE (FLAGYL) IVPB 500 mg        500 mg 100 mL/hr over 60 Minutes Intravenous  Once 08/22/22 0047 08/22/22 0412        Assessment/Plan Smoldering sigmoid diverticulitis - several bouts x10 years, last bout 1 year ago, this episode began 2 months ago and she has gone through a course of cipro/ flagyl followed by augmentin with no relief - CT yesterday showed interval worsening of sigmoid diverticulitis, no free air or abscess. WBC is WNL, VSS. No peritonitis on exam. - No indication for acute surgical intervention. - Agree with bowel rest and IV  antibiotics. Hopefully this will resolve with conservative management. If she does improve we can plan for GI follow up at discharge for a repeat colonoscopy in 6-8 weeks, followed by an appointment with Dr. Cliffton Asters to discuss elective resection as she has seen him in the past for this. If she fails to improve we may have to consider surgery this admission, which would include colectomy and probable colostomy. We will follow.   ID - rocephin/flagyl VTE - lovenox FEN - IVF, NPO Foley - none  S/p gastric sleeve 02/19/2022 HTN GERD Polyneuropathy   I reviewed ED provider notes, hospitalist notes, last 24 h vitals and pain scores, last 48 h intake and output, last 24 h labs and trends, and last 24 h imaging results   Franne Forts, PA-C [facility] 08/22/2022, 1:17 PM Please see Amion for pager number during day hours 7:00am-4:30pm

## 2022-08-22 NOTE — H&P (Signed)
History and Physical  Kristin Gibson HBZ:[id number] DOB: [birth year] DOA: 08/21/2022  Referring physician: Dr. Preston Fleeting, EDP  PCP: Woodroe Chen, MD  Outpatient Specialists: GI, general surgery.  Patient coming from: Home  Chief Complaint: Left lower quadrant abdominal pain   HPI: Kristin Gibson is a 66 y.o. female with medical history significant for gastric sleeve surgery for weight loss February 2023, recurrent diverticulitis, GERD, recently diagnosed sigmoid diverticulitis at outside facility, completed 2 rounds of antibiotics.  Initially completed antibiotic course of Cipro and Flagyl, then Augmentin.  After completion of second round of antibiotics, her symptoms recurred.  The pain is more severe.  Admits to subjective fever with Tmax 100.2 at home and chills.  No vomiting or diarrhea.  Previously seen by surgery with plan for partial colectomy but this was postponed to prioritize her knee surgery.  She presented to the ED for further evaluation.    Work-up in the ED revealed worsening sigmoid diverticulitis without free air or abscess, seen on CT scan.  Started on Rocephin and IV Flagyl empirically in the ED.  TRH, hospitalist service, was asked to admit.  ED Course: Tmax 100.2.  BP 148/90, pulse 76, respiration rate 95.  Lab studies remarkable for UA negative.  Hemoglobin 11.9 from baseline of 12.  Review of Systems: Review of systems as noted in the HPI. All other systems reviewed and are negative.   Past Medical History:  Diagnosis Date   Arthritis    Back pain    Diverticulitis    GERD (gastroesophageal reflux disease)    Hypertension    Knee pain, chronic    PONV (postoperative nausea and vomiting)    Pre-diabetes    Sleep apnea    cpap   Past Surgical History:  Procedure Laterality Date   ABDOMINAL HYSTERECTOMY     BREAST REDUCTION SURGERY     KNEE SURGERY     LAPAROSCOPIC GASTRIC SLEEVE RESECTION N/A 02/19/2022   Procedure: LAPAROSCOPIC GASTRIC SLEEVE  RESECTION, HIATAL HERNIA REPAIR;  Surgeon: Berna Bue, MD;  Location: [facility];  Service: General;  Laterality: N/A;   right knee replacement      TUBAL LIGATION     UPPER GI ENDOSCOPY N/A 02/19/2022   Procedure: UPPER GI ENDOSCOPY;  Surgeon: Berna Bue, MD;  Location: [facility];  Service: General;  Laterality: N/A;    Social History:  reports that she has quit smoking. She has never used smokeless tobacco. She reports current alcohol use. She reports that she does not use drugs.   Allergies  Allergen Reactions   Benazepril Swelling   Sulfa Antibiotics Nausea Only   Elemental Sulfur Nausea Only    Family History  Problem Relation Age of Onset   Hypertension Mother    Cancer Sister       Prior to Admission medications   Medication Sig Start Date End Date Taking? Authorizing Provider  acetaminophen (TYLENOL) 500 MG tablet Take 1,000 mg by mouth daily as needed for mild pain. pain    Yes [provider]  amLODipine (NORVASC) 10 MG tablet Take 10 mg by mouth daily with supper. 08/08/17  Yes [provider]  amoxicillin (AMOXIL) 500 MG capsule Take 2,000 mg by mouth once.   Yes [provider]  amoxicillin-clavulanate (AUGMENTIN) 875-125 MG tablet Take 1 tablet by mouth 2 (two) times daily. 08/17/22  Yes [provider]  cloNIDine (CATAPRES) 0.1 MG tablet Take 0.1 mg by mouth daily with supper. 10/13/18  Yes [provider]  dicyclomine (BENTYL) 20 MG tablet Take 20 mg by mouth every 8 (eight) hours. 12/05/16  Yes [provider]  methocarbamol (ROBAXIN) 500 MG tablet Take 500 mg by mouth every 8 (eight) hours as needed for muscle spasms.   Yes [provider]  Multiple Vitamin (MULTIVITAMIN) tablet Take 1 tablet by mouth daily.   Yes [provider]  ondansetron (ZOFRAN-ODT) 4 MG disintegrating tablet Take 1 tablet (4 mg total) by mouth every 6 (six) hours as needed for nausea or vomiting. 02/20/22  Yes Berna Bue, MD  pantoprazole (PROTONIX) 40 MG tablet Take 1 tablet (40 mg total) by mouth daily. 02/20/22  Yes Berna Bue, MD  polyethylene glycol (MIRALAX / GLYCOLAX) 17 g packet Take 17 g by mouth daily as needed (constipation).   Yes [provider]  Probiotic CAPS Take 1 capsule by mouth daily. 06/28/20  Yes Ward, Layla Maw, DO  tizanidine (ZANAFLEX) 2 MG capsule Take 2 mg by mouth every 8 (eight) hours as needed for muscle spasms.   Yes [provider]  traMADol (ULTRAM) 50 MG tablet Take 1 tablet (50 mg total) by mouth every 6 (six) hours as needed (pain). 02/20/22  Yes Berna Bue, MD  gabapentin (NEURONTIN) 100 MG capsule Take 2 capsules (200 mg total) by mouth every 12 (twelve) hours. Patient not taking: Reported on 08/22/2022 02/20/22   Berna Bue, MD    Physical Exam: BP (!) 157/102   Pulse 76   Temp 98.6 F (37 C)   Resp 15   Ht 5\' 9"  (1.753 m)   Wt 112.5 kg   SpO2 98%   BMI 36.62 kg/m   General: 66 y.o. year-old female well developed well nourished in no acute distress.  Alert and oriented x3. Cardiovascular: Regular rate and rhythm with no rubs or gallops.  No thyromegaly or JVD noted.  No lower extremity edema. 2/4 pulses in all 4 extremities. Respiratory: Clear to auscultation with no wheezes or rales. Good inspiratory effort. Abdomen: Soft tender left lower quadrant, nondistended with normal bowel sounds x4 quadrants. Muskuloskeletal: No cyanosis, clubbing or edema noted bilaterally Neuro: CN II-XII intact, strength, sensation, reflexes Skin: No ulcerative lesions noted or rashes Psychiatry: Judgement and insight appear normal. Mood is appropriate for condition and setting          Labs on Admission:  Basic Metabolic Panel: Recent Labs  Lab 08/21/22 2306  NA 139  K 3.6  CL 103  CO2 25  GLUCOSE 92  BUN 10  CREATININE 0.80  CALCIUM 9.2   Liver Function Tests: Recent Labs  Lab 08/21/22 2306  AST 18  ALT 16  ALKPHOS 55   BILITOT 0.5  PROT 7.9  ALBUMIN 3.3*   Recent Labs  Lab 08/21/22 2306  LIPASE 26   No results for input(s): "AMMONIA" in the last 168 hours. CBC: Recent Labs  Lab 08/21/22 2306  WBC 7.9  NEUTROABS 5.2  HGB 11.9*  HCT 37.1  MCV 89.0  PLT 234   Cardiac Enzymes: No results for input(s): "CKTOTAL", "CKMB", "CKMBINDEX", "TROPONINI" in the last 168 hours.  BNP (last 3 results) No results for input(s): "BNP" in the last 8760 hours.  ProBNP (last 3 results) No results for input(s): "PROBNP" in the last 8760 hours.  CBG: No results for input(s): "GLUCAP" in the last 168 hours.  Radiological Exams on Admission: CT ABDOMEN PELVIS W CONTRAST  Result Date: 08/22/2022 CLINICAL DATA:  Left lower quadrant abdominal  pain. EXAM: CT ABDOMEN AND PELVIS WITH CONTRAST TECHNIQUE: Multidetector CT imaging of the abdomen and pelvis was performed using the standard protocol following bolus administration of intravenous contrast. RADIATION DOSE REDUCTION: This exam was performed according to the departmental dose-optimization program which includes automated exposure control, adjustment of the mA and/or kV according to patient size and/or use of iterative reconstruction technique. CONTRAST:  OMNIPAQUE IOHEXOL 300 MG/ML  SOLN COMPARISON:  06/28/2020. FINDINGS: Lower chest: The heart is normal in size and there is a small pericardial effusion. Mild atelectasis or scarring is present at the lung bases. Hepatobiliary: No focal liver abnormality is seen. No gallstones, gallbladder wall thickening, or biliary dilatation. Pancreas: Unremarkable. No pancreatic ductal dilatation or surrounding inflammatory changes. Spleen: Normal in size without focal abnormality. Adrenals/Urinary Tract: No adrenal nodule or mass. The kidneys enhance symmetrically. Multiple renal cysts are noted bilaterally. No renal calculus or hydronephrosis. The bladder is unremarkable. Stomach/Bowel: There is a small hiatal hernia.  Gastric surgery changes are noted. A moderate amount of retained stool is present in the colon. Multiple scattered diverticula are present along the sigmoid colon with bowel wall thickening and surrounding fat stranding, with interval worsening in appearance from the prior exam. No free air or abscess is seen. No bowel obstruction. The appendix is not seen. Vascular/Lymphatic: Aortic atherosclerotic asses. Prominent lymph nodes are noted in the periaortic space and in the mesentery in the pelvis which are new from the previous exam and are likely reactive. Reproductive: Status post hysterectomy. No adnexal masses. Other: No ascites. Musculoskeletal: Degenerative changes are present in the thoracolumbar spine. Sclerosis is seen at the sacroiliac joints bilaterally, compatible with sacroiliitis. No acute osseous abnormality. IMPRESSION: 1. Interval worsening of sigmoid diverticulitis. No free air or abscess. 2. Interval development of prominent lymph nodes in the retroperitoneum and mesentery in the pelvis, likely reactive. 3. Moderate amount of retained stool in the colon suggesting constipation. 4. Aortic atherosclerosis. 5. Remaining incidental findings as described above. Electronically Signed   By: Thornell Sartorius M.D.   On: 08/22/2022 02:04    EKG: I independently viewed the EKG done and my findings are as followed: Sinus rhythm rate of 75.  Nonspecific ST-T changes.  QTc 453.  Assessment/Plan Present on Admission:  Sigmoid diverticulitis  Principal Problem:   Sigmoid diverticulitis  Sigmoid diverticulitis, failed outpatient treatment. History of recurrent diverticulitis, previously seen by surgery with plan for partial colectomy but this was postponed to prioritize her knee surgery Continue Rocephin and IV Flagyl started in the ED. Monitor fever curve and WBC Follow blood cultures x2 peripherally for ID and sensitivities. Pain control with bowel regimen. Repeat CBC Consult general surgery in the  morning.  Dr. Donell Beers added to patient care team.  Essential hypertension Resume home regimen Monitor vital signs  GERD Resume home PPI  Polyneuropathy Resume home regimen  Obesity status post gastric sleeve procedure for weight loss BMI 36 Recommend weight loss outpatient with regular physical activity and healthy dieting.    DVT prophylaxis: Subcu Lovenox daily  Code Status: Full code  Family Communication: None at bedside  Disposition Plan: Admitted to telemetry unit  Consults called: None.  Admission status: Inpatient status.   Status is: Inpatient The patient requires at least 2 midnights for further evaluation and treatment of present condition.   Darlin Drop MD [facility] Pager [phone]  If 7PM-7AM, please contact night-coverage [email or web address] Password [email or web address]  08/22/2022, 2:36 AM

## 2022-08-23 DIAGNOSIS — K5732 Diverticulitis of large intestine without perforation or abscess without bleeding: Secondary | ICD-10-CM | POA: Diagnosis not present

## 2022-08-23 LAB — COMPREHENSIVE METABOLIC PANEL
ALT: 14 U/L (ref 0–44)
AST: 16 U/L (ref 15–41)
Albumin: 2.8 g/dL — ABNORMAL LOW (ref 3.5–5.0)
Alkaline Phosphatase: 50 U/L (ref 38–126)
Anion gap: 7 (ref 5–15)
BUN: 6 mg/dL — ABNORMAL LOW (ref 8–23)
CO2: 28 mmol/L (ref 22–32)
Calcium: 8.6 mg/dL — ABNORMAL LOW (ref 8.9–10.3)
Chloride: 103 mmol/L (ref 98–111)
Creatinine, Ser: 0.8 mg/dL (ref 0.44–1.00)
GFR, Estimated: >60 mL/min (ref >60)
Glucose, Bld: 101 mg/dL — ABNORMAL HIGH (ref 70–99)
Potassium: 3.3 mmol/L — ABNORMAL LOW (ref 3.5–5.1)
Sodium: 138 mmol/L (ref 135–145)
Total Bilirubin: 0.5 mg/dL (ref 0.3–1.2)
Total Protein: 7 g/dL (ref 6.5–8.1)

## 2022-08-23 LAB — CBC WITH DIFFERENTIAL/PLATELET
Abs Immature Granulocytes: 0.03 10*3/uL (ref 0.00–0.07)
Basophils Absolute: 0 10*3/uL (ref 0.0–0.1)
Basophils Relative: 0 %
Eosinophils Absolute: 0.1 10*3/uL (ref 0.0–0.5)
Eosinophils Relative: 2 %
HCT: 34.7 % — ABNORMAL LOW (ref 36.0–46.0)
Hemoglobin: 11 g/dL — ABNORMAL LOW (ref 12.0–15.0)
Immature Granulocytes: 0 %
Lymphocytes Relative: 18 %
Lymphs Abs: 1.3 10*3/uL (ref 0.7–4.0)
MCH: 28.3 pg (ref 26.0–34.0)
MCHC: 31.7 g/dL (ref 30.0–36.0)
MCV: 89.2 fL (ref 80.0–100.0)
Monocytes Absolute: 0.9 10*3/uL (ref 0.1–1.0)
Monocytes Relative: 12 %
Neutro Abs: 5.2 10*3/uL (ref 1.7–7.7)
Neutrophils Relative %: 68 %
Platelets: 220 10*3/uL (ref 150–400)
RBC: 3.89 MIL/uL (ref 3.87–5.11)
RDW: 14.2 % (ref 11.5–15.5)
WBC: 7.6 10*3/uL (ref 4.0–10.5)
nRBC: 0 % (ref 0.0–0.2)

## 2022-08-23 LAB — MAGNESIUM: Magnesium: 1.9 mg/dL (ref 1.7–2.4)

## 2022-08-23 LAB — PHOSPHORUS: Phosphorus: 3.6 mg/dL (ref 2.5–4.6)

## 2022-08-23 MED ORDER — POTASSIUM CHLORIDE CRYS ER 20 MEQ PO TBCR
40.0000 meq | EXTENDED_RELEASE_TABLET | Freq: Once | ORAL | Status: AC
Start: 2022-08-23 — End: 2022-08-23
  Administered 2022-08-23: 40 meq via ORAL
  Filled 2022-08-23: qty 2

## 2022-08-23 MED ORDER — HYDROCORTISONE 1 % EX CREA
TOPICAL_CREAM | Freq: Three times a day (TID) | CUTANEOUS | Status: DC | PRN
  Filled 2022-08-23: qty 28

## 2022-08-23 MED ORDER — GUAIFENESIN-DM 100-10 MG/5ML PO SYRP
10.0000 mL | ORAL_SOLUTION | Freq: Once | ORAL | Status: AC
  Administered 2022-08-23: 10 mL via ORAL
  Filled 2022-08-23 (×2): qty 10

## 2022-08-23 NOTE — TOC Initial Note (Signed)
Transition of Care [facility]) - Initial/Assessment Note    Patient Details  Name: Kristin Gibson MRN: [id number] Date of Birth: 28-Jan-1956  Transition of Care [facility]) CM/SW Contact:    Golda Acre, RN Phone Number: 08/23/2022, 7:34 AM  Clinical Narrative:                  Transition of Care [facility]) Screening Note   Patient Details  Name: Kristin Gibson Date of Birth: May 09, 1956   Transition of Care [facility]) CM/SW Contact:    Golda Acre, RN Phone Number: 08/23/2022, 7:34 AM    Transition of Care Department [facility]) has reviewed patient and no TOC needs have been identified at this time. We will continue to monitor patient advancement through interdisciplinary progression rounds. If new patient transition needs arise, please place a TOC consult.    Expected Discharge Plan: Home/Self Care Barriers to Discharge: Continued Medical Work up   Patient Goals and CMS Choice Patient states their goals for this hospitalization and ongoing recovery are:: to return to home CMS Medicare.gov Compare Post Acute Care list provided to:: Patient Choice offered to / list presented to : Patient  Expected Discharge Plan and Services Expected Discharge Plan: Home/Self Care   Discharge Planning Services: CM Consult   Living arrangements for the past 2 months: Apartment                                      Prior Living Arrangements/Services Living arrangements for the past 2 months: Apartment Lives with:: Self Patient language and need for interpreter reviewed:: Yes Do you feel safe going back to the place where you live?: Yes            Criminal Activity/Legal Involvement Pertinent to Current Situation/Hospitalization: No - Comment as needed  Activities of Daily Living Home Assistive Devices/Equipment: Eyeglasses ADL Screening (condition at time of admission) Patient's cognitive ability adequate to safely complete daily activities?: Yes Is the patient deaf or  have difficulty hearing?: No Does the patient have difficulty seeing, even when wearing glasses/contacts?: No Does the patient have difficulty concentrating, remembering, or making decisions?: No Patient able to express need for assistance with ADLs?: Yes Does the patient have difficulty dressing or bathing?: No Independently performs ADLs?: Yes (appropriate for developmental age) Does the patient have difficulty walking or climbing stairs?: No Weakness of Legs: None Weakness of Arms/Hands: None  Permission Sought/Granted                  Emotional Assessment Appearance:: Appears stated age Attitude/Demeanor/Rapport: Engaged Affect (typically observed): Calm Orientation: : Oriented to Self, Oriented to Place, Oriented to  Time, Oriented to Situation Alcohol / Substance Use: Tobacco Use, Alcohol Use (quit smoking current occasional etoh use.) Psych Involvement: No (comment)  Admission diagnosis:  Sigmoid diverticulitis [K57.32] Diverticulitis of colon without hemorrhage [K57.32] Normochromic normocytic anemia [D64.9] Elevated blood pressure reading with diagnosis of hypertension [I10] Patient Active Problem List   Diagnosis Date Noted   Sigmoid diverticulitis 08/22/2022   Morbid obesity (HCC) 02/19/2022   Colon polyp 09/26/2017   Hemorrhoids 09/26/2017   Hypokalemia 07/29/2017   Major depressive disorder with single episode, in full remission (HCC) 07/29/2017   Constipation 02/25/2017   Diverticulitis of large intestine 02/25/2017   Lower abdominal pain 02/25/2017   GERD (gastroesophageal reflux disease) 01/15/2017   Morbid obesity with BMI of 40.0-44.9, adult (HCC) 01/15/2017  OAB (overactive bladder) 01/15/2017   OSA (obstructive sleep apnea) 12/04/2016   Primary osteoarthritis of both first carpometacarpal joints 11/20/2016   Primary osteoarthritis of left knee 11/20/2016   Primary osteoarthritis of right knee 11/20/2016   Chronic pain of both knees 11/05/2016    Edema leg 11/05/2016   Hypertension 02/19/2008   ARTHRITIS 02/19/2008   LOW BACK PAIN 02/19/2008   PCP:  Woodroe Chen, MD Pharmacy:   [facility] [phone] - [place], [place] - [address] AT [facility] OF [address] & [address] [place] [address] Phone: [phone] Fax: [phone]     Social Determinants of Health (SDOH) Interventions    Readmission Risk Interventions   No data to display

## 2022-08-23 NOTE — Progress Notes (Signed)
PROGRESS NOTE  Kristin Gibson XIH:[id number] DOB: 05-Aug-1956 DOA: 08/21/2022 PCP: Woodroe Chen, MD   LOS: 1 day   Brief Narrative / Interim history: 66 year old female with history of gastric sleeve surgery for weight loss February 2093, recurrent diverticulitis, recently completed 2 rounds of antibiotics comes to the hospital with recurrent abdominal pain, left lower quadrant, as well as subjective fever and chills at home.  Work-up in the ED revealed sigmoid diverticulitis  Pertinent data: 8/30-CT abdomen pelvis-interval worsening of sigmoid diverticulitis, no free air or abscess.  Subjective / 24h Interval events: Feeling about the same today, continues to have left lower quadrant abdominal pain.  Assesement and Plan: Principal problem Recurrent sigmoid diverticulitis, failed outpatient treatment-patient has a history of recurrent diverticulitis, previously seen by surgery with plans for partial colectomy but this was postponed.  Not really much better today, keep n.p.o., IV antibiotics.  General surgery consulted, appreciate input and follow-up  Active problems Essential hypertension-continue home medications, blood pressure is controlled  Obesity, class II-BMI 36, she is status post gastric sleeve procedure for weight loss  GERD-continue PPI  Hypokalemia-replete potassium and monitor  Scheduled Meds:  amLODipine  10 mg Oral Q supper   cloNIDine  0.1 mg Oral Q supper   docusate sodium  100 mg Oral BID   enoxaparin (LOVENOX) injection  40 mg Subcutaneous Q24H   multivitamin with minerals  1 tablet Oral Daily   pantoprazole  40 mg Oral Daily   pneumococcal 20-valent conjugate vaccine  0.5 mL Intramuscular Tomorrow-1000   polyethylene glycol  17 g Oral BID   potassium chloride  40 mEq Oral Once   saccharomyces boulardii  250 mg Oral BID   Continuous Infusions:  cefTRIAXone (ROCEPHIN)  IV 2 g (08/23/22 0332)   lactated ringers 50 mL/hr at 08/22/22 2205    metronidazole 500 mg (08/23/22 0202)   PRN Meds:.acetaminophen, diphenhydrAMINE, fentaNYL (SUBLIMAZE) injection, melatonin, prochlorperazine  Diet Orders (From admission, onward)     Start     Ordered   08/23/22 0841  Diet NPO time specified Except for: Ice Chips, Sips with Meds, Other (See Comments)  Diet effective now       Comments: Sips of clears  Question Answer Comment  Except for Ice Chips   Except for Sips with Meds   Except for Other (See Comments)      08/23/22 0840            DVT prophylaxis: enoxaparin (LOVENOX) injection 40 mg Start: 08/22/22 1000   Lab Results  Component Value Date   PLT 220 08/23/2022      Code Status: Full Code  Family Communication: No family at bedside  Status is: Inpatient  Remains inpatient appropriate because: Persistent abdominal pain  Level of care: Telemetry  Consultants:  General surgery  Objective: Vitals:   08/22/22 2204 08/22/22 2300 08/23/22 0328 08/23/22 0628  BP: 115/62  131/80 109/74  Pulse: (!) 45  82 88  Resp: 20  18 20   Temp: 99.9 F (37.7 C) 98.1 F (36.7 C) 99.1 F (37.3 C) 98.6 F (37 C)  TempSrc: Oral Oral Oral Oral  SpO2: 93%  97% 95%  Weight:      Height:        Intake/Output Summary (Last 24 hours) at 08/23/2022 1105 Last data filed at 08/23/2022 0936 Gross per 24 hour  Intake 1702.66 ml  Output 650 ml  Net 1052.66 ml   Wt Readings from Last 3 Encounters:  08/21/22 112.5 kg  08/07/22  114.1 kg  04/17/22 122.7 kg    Examination:  Constitutional: NAD Eyes: no scleral icterus ENMT: Mucous membranes are moist.  Neck: normal, supple Respiratory: clear to auscultation bilaterally, no wheezing, no crackles.  Cardiovascular: Regular rate and rhythm, no murmurs / rubs / gallops. No LE edema.  Abdomen: non distended, no tenderness. Bowel sounds positive.  Musculoskeletal: no clubbing / cyanosis.  Skin: no rashes Neurologic: non focal   Data Reviewed: I have independently reviewed  following labs and imaging studies   CBC Recent Labs  Lab 08/21/22 2306 08/22/22 0430 08/23/22 0418  WBC 7.9 7.3 7.6  HGB 11.9* 10.7* 11.0*  HCT 37.1 34.2* 34.7*  PLT 234 217 220  MCV 89.0 91.2 89.2  MCH 28.5 28.5 28.3  MCHC 32.1 31.3 31.7  RDW 14.3 14.3 14.2  LYMPHSABS 1.9  --  1.3  MONOABS 0.8  --  0.9  EOSABS 0.1  --  0.1  BASOSABS 0.0  --  0.0    Recent Labs  Lab 08/21/22 2306 08/22/22 0430 08/23/22 0418  NA 139  --  138  K 3.6  --  3.3*  CL 103  --  103  CO2 25  --  28  GLUCOSE 92  --  101*  BUN 10  --  6*  CREATININE 0.80 0.79 0.80  CALCIUM 9.2  --  8.6*  AST 18  --  16  ALT 16  --  14  ALKPHOS 55  --  50  BILITOT 0.5  --  0.5  ALBUMIN 3.3*  --  2.8*  MG  --   --  1.9    ------------------------------------------------------------------------------------------------------------------ No results for input(s): "CHOL", "HDL", "LDLCALC", "TRIG", "CHOLHDL", "LDLDIRECT" in the last 72 hours.  Lab Results  Component Value Date   HGBA1C 6.0 (H) 02/06/2022   ------------------------------------------------------------------------------------------------------------------ No results for input(s): "TSH", "T4TOTAL", "T3FREE", "THYROIDAB" in the last 72 hours.  Invalid input(s): "FREET3"  Cardiac Enzymes No results for input(s): "CKMB", "TROPONINI", "MYOGLOBIN" in the last 168 hours.  Invalid input(s): "CK" ------------------------------------------------------------------------------------------------------------------ No results found for: "BNP"  CBG: No results for input(s): "GLUCAP" in the last 168 hours.  Recent Results (from the past 240 hour(s))  Culture, blood (Routine X 2) w Reflex to ID Panel     Status: None (Preliminary result)   Collection Time: 08/22/22  3:58 PM   Specimen: BLOOD  Result Value Ref Range Status   Specimen Description   Final    BLOOD BLOOD RIGHT ARM Performed at [facility], [address]  [place], [place] [address]    Special Requests   Final    BOTTLES DRAWN AEROBIC ONLY Blood Culture adequate volume Performed at [facility], [address] [place], [place] [address]    Culture   Final    NO GROWTH < 24 HOURS Performed at [facility], [address] [place], [place] [address]    Report Status PENDING  Incomplete  Culture, blood (Routine X 2) w Reflex to ID Panel     Status: None (Preliminary result)   Collection Time: 08/22/22  3:58 PM   Specimen: BLOOD  Result Value Ref Range Status   Specimen Description   Final    BLOOD BLOOD RIGHT HAND Performed at [facility], [address], [place], [place] [address]    Special Requests   Final    BOTTLES DRAWN AEROBIC ONLY Blood Culture adequate volume Performed at [facility], [address] [place], [place] [address]    Culture   Final    NO GROWTH < 24 HOURS Performed at [facility], [address] [place], [place] [address]    Report Status PENDING  Incomplete     Radiology Studies: No results found.   Pamella Pert, MD, PhD [facility]  Between 7 am - 7 pm I am available, please contact me via Amion (for emergencies) or Securechat (non urgent messages)  Between 7 pm - 7 am I am not available, please contact night coverage MD/APP via Amion

## 2022-08-23 NOTE — Progress Notes (Signed)
   08/22/22 2044  Assess: MEWS Score  Temp (!) 102.8 F (39.3 C)  BP 129/79  MAP (mmHg) 94  Pulse Rate 86  SpO2 93 %  Assess: MEWS Score  MEWS Temp 2  MEWS Systolic 0  MEWS Pulse 0  MEWS RR 0  MEWS LOC 0  MEWS Score 2  MEWS Score Color Yellow  Assess: if the MEWS score is Yellow or Red  Were vital signs taken at a resting state? Yes  Focused Assessment No change from prior assessment  Does the patient meet 2 or more of the SIRS criteria? Yes  Does the patient have a confirmed or suspected source of infection? Yes  Provider and Rapid Response Notified? Yes  MEWS guidelines implemented *See Row Information* Yes  Treat  MEWS Interventions Administered prn meds/treatments  Take Vital Signs  Increase Vital Sign Frequency  Yellow: Q 2hr X 2 then Q 4hr X 2, if remains yellow, continue Q 4hrs  Escalate  MEWS: Escalate Yellow: discuss with charge nurse/RN and consider discussing with provider and RRT  Notify: Charge Nurse/RN  Name of Charge Nurse/RN Notified Lauren, RN  Date Charge Nurse/RN Notified 08/22/22  Time Charge Nurse/RN Notified 2106  Notify: Provider  Provider Name/Title Toniann Fail, MD  Date Provider Notified 08/22/22  Time Provider Notified 2106  Method of Notification  (secure chat)  Notification Reason Change in status  Provider response No new orders  Date of Provider Response 08/22/22  Time of Provider Response 2106  Notify: Rapid Response  Name of Rapid Response RN Notified Tracie  Date Rapid Response Notified 08/22/22  Time Rapid Response Notified 2200  Document  Patient Outcome Stabilized after interventions  Assess: SIRS CRITERIA  SIRS Temperature  1  SIRS Pulse 0  SIRS Respirations  0  SIRS WBC 1  SIRS Score Sum  2

## 2022-08-23 NOTE — Progress Notes (Addendum)
[facility] Progress Note     Subjective: CC-  Feels the same as yesterday, no better no worse. Continues to have mostly LLQ abdominal pain. Denies n/v. No BM. She did notice blood on tissue paper when she wiped. WBC 7.6. febrile over night to 102.8.  Objective: Vital signs in last 24 hours: Temp:  [98.1 F (36.7 C)-102.8 F (39.3 C)] 98.6 F (37 C) (08/31 0628) Pulse Rate:  [45-94] 88 (08/31 0628) Resp:  [15-20] 20 (08/31 0628) BP: (109-139)/(62-84) 109/74 (08/31 0628) SpO2:  [93 %-97 %] 95 % (08/31 0628) Last BM Date : 08/21/22 (pta)  Intake/Output from previous day: 08/30 0701 - 08/31 0700 In: 1381.6 [P.O.:180; I.V.:914.2; IV Piggyback:287.5] Out: 650 [Urine:650] Intake/Output this shift: Total I/O In: 201 [I.V.:201] Out: -   PE: Gen:  Alert, NAD, pleasant Abd: soft, mostly tender in the LLQ with mild LUQ and RUQ TTP without peritonitis. +BS, no masses, hernias, or organomegaly  Lab Results:  Recent Labs    08/22/22 0430 08/23/22 0418  WBC 7.3 7.6  HGB 10.7* 11.0*  HCT 34.2* 34.7*  PLT 217 220   BMET Recent Labs    08/21/22 2306 08/22/22 0430 08/23/22 0418  NA 139  --  138  K 3.6  --  3.3*  CL 103  --  103  CO2 25  --  28  GLUCOSE 92  --  101*  BUN 10  --  6*  CREATININE 0.80 0.79 0.80  CALCIUM 9.2  --  8.6*   PT/INR No results for input(s): "LABPROT", "INR" in the last 72 hours. CMP     Component Value Date/Time   NA 138 08/23/2022 0418   K 3.3 (L) 08/23/2022 0418   CL 103 08/23/2022 0418   CO2 28 08/23/2022 0418   GLUCOSE 101 (H) 08/23/2022 0418   BUN 6 (L) 08/23/2022 0418   CREATININE 0.80 08/23/2022 0418   CALCIUM 8.6 (L) 08/23/2022 0418   PROT 7.0 08/23/2022 0418   ALBUMIN 2.8 (L) 08/23/2022 0418   AST 16 08/23/2022 0418   ALT 14 08/23/2022 0418   ALKPHOS 50 08/23/2022 0418   BILITOT 0.5 08/23/2022 0418   GFRNONAA >60 08/23/2022 0418   GFRAA >60 06/28/2020 0548   Lipase     Component Value Date/Time   LIPASE 26  08/21/2022 2306       Studies/Results: CT ABDOMEN PELVIS W CONTRAST  Result Date: 08/22/2022 CLINICAL DATA:  Left lower quadrant abdominal pain. EXAM: CT ABDOMEN AND PELVIS WITH CONTRAST TECHNIQUE: Multidetector CT imaging of the abdomen and pelvis was performed using the standard protocol following bolus administration of intravenous contrast. RADIATION DOSE REDUCTION: This exam was performed according to the departmental dose-optimization program which includes automated exposure control, adjustment of the mA and/or kV according to patient size and/or use of iterative reconstruction technique. CONTRAST:  OMNIPAQUE IOHEXOL 300 MG/ML  SOLN COMPARISON:  06/28/2020. FINDINGS: Lower chest: The heart is normal in size and there is a small pericardial effusion. Mild atelectasis or scarring is present at the lung bases. Hepatobiliary: No focal liver abnormality is seen. No gallstones, gallbladder wall thickening, or biliary dilatation. Pancreas: Unremarkable. No pancreatic ductal dilatation or surrounding inflammatory changes. Spleen: Normal in size without focal abnormality. Adrenals/Urinary Tract: No adrenal nodule or mass. The kidneys enhance symmetrically. Multiple renal cysts are noted bilaterally. No renal calculus or hydronephrosis. The bladder is unremarkable. Stomach/Bowel: There is a small hiatal hernia. Gastric surgery changes are noted. A moderate amount of retained stool  is present in the colon. Multiple scattered diverticula are present along the sigmoid colon with bowel wall thickening and surrounding fat stranding, with interval worsening in appearance from the prior exam. No free air or abscess is seen. No bowel obstruction. The appendix is not seen. Vascular/Lymphatic: Aortic atherosclerotic asses. Prominent lymph nodes are noted in the periaortic space and in the mesentery in the pelvis which are new from the previous exam and are likely reactive. Reproductive: Status post hysterectomy.  No adnexal masses. Other: No ascites. Musculoskeletal: Degenerative changes are present in the thoracolumbar spine. Sclerosis is seen at the sacroiliac joints bilaterally, compatible with sacroiliitis. No acute osseous abnormality. IMPRESSION: 1. Interval worsening of sigmoid diverticulitis. No free air or abscess. 2. Interval development of prominent lymph nodes in the retroperitoneum and mesentery in the pelvis, likely reactive. 3. Moderate amount of retained stool in the colon suggesting constipation. 4. Aortic atherosclerosis. 5. Remaining incidental findings as described above. Electronically Signed   By: Thornell Sartorius M.D.   On: 08/22/2022 02:04    Anti-infectives: Anti-infectives (From admission, onward)    Start     Dose/Rate Route Frequency Ordered Stop   08/23/22 0400  cefTRIAXone (ROCEPHIN) 2 g in sodium chloride 0.9 % 100 mL IVPB        2 g 200 mL/hr over 30 Minutes Intravenous Every 24 hours 08/22/22 0345     08/22/22 1400  metroNIDAZOLE (FLAGYL) IVPB 500 mg        500 mg 100 mL/hr over 60 Minutes Intravenous Every 12 hours 08/22/22 0345     08/22/22 0100  cefTRIAXone (ROCEPHIN) 2 g in sodium chloride 0.9 % 100 mL IVPB        2 g 200 mL/hr over 30 Minutes Intravenous  Once 08/22/22 0047 08/22/22 0226   08/22/22 0100  metroNIDAZOLE (FLAGYL) IVPB 500 mg        500 mg 100 mL/hr over 60 Minutes Intravenous  Once 08/22/22 0047 08/22/22 0412        Assessment/Plan Smoldering sigmoid diverticulitis - several bouts x10 years, last bout 1 year ago, this episode began 2 months ago and she has gone through a course of cipro/ flagyl followed by augmentin outpatient with no relief - CT 8/30 showed interval worsening of sigmoid diverticulitis, no free air or abscess.  - WBC remains WNL but febrile over night 102.8. She remains tender in the LLQ on exam, no better no worse. No peritonitis on exam. No indication for acute surgical intervention. She has only been on these IV antibiotics for  just over 24 hours so I think we should continue this for a little longer. If not improving may need to considering switching IV antibiotics. Ok for sips today. Mobilize.   ID - rocephin/flagyl VTE - lovenox FEN - IVF, sips Foley - none Follow up - if she improves can see Dr. 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

## 2022-08-24 DIAGNOSIS — K5732 Diverticulitis of large intestine without perforation or abscess without bleeding: Secondary | ICD-10-CM | POA: Diagnosis not present

## 2022-08-24 LAB — COMPREHENSIVE METABOLIC PANEL
ALT: 13 U/L (ref 0–44)
AST: 14 U/L — ABNORMAL LOW (ref 15–41)
Albumin: 2.8 g/dL — ABNORMAL LOW (ref 3.5–5.0)
Alkaline Phosphatase: 48 U/L (ref 38–126)
Anion gap: 9 (ref 5–15)
BUN: 7 mg/dL — ABNORMAL LOW (ref 8–23)
CO2: 26 mmol/L (ref 22–32)
Calcium: 8.7 mg/dL — ABNORMAL LOW (ref 8.9–10.3)
Chloride: 104 mmol/L (ref 98–111)
Creatinine, Ser: 0.83 mg/dL (ref 0.44–1.00)
GFR, Estimated: >60 mL/min (ref >60)
Glucose, Bld: 81 mg/dL (ref 70–99)
Potassium: 3.5 mmol/L (ref 3.5–5.1)
Sodium: 139 mmol/L (ref 135–145)
Total Bilirubin: 0.5 mg/dL (ref 0.3–1.2)
Total Protein: 7 g/dL (ref 6.5–8.1)

## 2022-08-24 LAB — CBC
HCT: 33 % — ABNORMAL LOW (ref 36.0–46.0)
Hemoglobin: 10.6 g/dL — ABNORMAL LOW (ref 12.0–15.0)
MCH: 28.8 pg (ref 26.0–34.0)
MCHC: 32.1 g/dL (ref 30.0–36.0)
MCV: 89.7 fL (ref 80.0–100.0)
Platelets: 225 10*3/uL (ref 150–400)
RBC: 3.68 MIL/uL — ABNORMAL LOW (ref 3.87–5.11)
RDW: 14.2 % (ref 11.5–15.5)
WBC: 7.2 10*3/uL (ref 4.0–10.5)
nRBC: 0 % (ref 0.0–0.2)

## 2022-08-24 MED ORDER — FENTANYL CITRATE PF 50 MCG/ML IJ SOSY
12.5000 ug | PREFILLED_SYRINGE | INTRAMUSCULAR | Status: DC | PRN
  Administered 2022-08-25 – 2022-08-26 (×3): 25 ug via INTRAVENOUS
  Administered 2022-08-28: 12.5 ug via INTRAVENOUS
  Administered 2022-08-29 – 2022-08-31 (×7): 25 ug via INTRAVENOUS
  Filled 2022-08-24 (×11): qty 1

## 2022-08-24 MED ORDER — OXYCODONE HCL 5 MG PO TABS
5.0000 mg | ORAL_TABLET | ORAL | Status: DC | PRN
  Administered 2022-08-24: 5 mg via ORAL
  Administered 2022-08-24: 10 mg via ORAL
  Administered 2022-08-25: 5 mg via ORAL
  Administered 2022-08-25 – 2022-08-28 (×3): 10 mg via ORAL
  Administered 2022-08-28: 5 mg via ORAL
  Administered 2022-08-28 – 2022-08-29 (×2): 10 mg via ORAL
  Administered 2022-08-29: 5 mg via ORAL
  Administered 2022-08-30 – 2022-09-02 (×4): 10 mg via ORAL
  Filled 2022-08-24 (×2): qty 1
  Filled 2022-08-24 (×2): qty 2
  Filled 2022-08-24: qty 1
  Filled 2022-08-24: qty 2
  Filled 2022-08-24: qty 1
  Filled 2022-08-24 (×8): qty 2

## 2022-08-24 MED ORDER — MAGNESIUM HYDROXIDE 400 MG/5ML PO SUSP
30.0000 mL | Freq: Once | ORAL | Status: AC
  Administered 2022-08-24: 30 mL via ORAL
  Filled 2022-08-24: qty 30

## 2022-08-24 NOTE — Progress Notes (Signed)
PROGRESS NOTE  Kristin Gibson RXV:[id number] DOB: 1956/03/08 DOA: 08/21/2022 PCP: Woodroe Chen, MD   LOS: 2 days   Brief Narrative / Interim history: 66 year old female with history of gastric sleeve surgery for weight loss February 2093, recurrent diverticulitis, recently completed 2 rounds of antibiotics comes to the hospital with recurrent abdominal pain, left lower quadrant, as well as subjective fever and chills at home.  Work-up in the ED revealed sigmoid diverticulitis  Pertinent data: 8/30-CT abdomen pelvis-interval worsening of sigmoid diverticulitis, no free air or abscess.  Subjective / 24h Interval events: Feels like her pain is a little bit better.  Assesement and Plan: Principal problem Recurrent sigmoid diverticulitis, failed outpatient treatment-patient has a history of recurrent diverticulitis, previously seen by surgery with plans for partial colectomy but this was postponed.  Somewhat improved today, allow clear liquids, continue IV antibiotics.  Appreciate general surgery follow-up.  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, she is status post gastric sleeve procedure for weight loss  GERD-continue PPI  Hypokalemia-continue to replete and monitor  Scheduled Meds:  amLODipine  10 mg Oral Q supper   cloNIDine  0.1 mg Oral Q supper   docusate sodium  100 mg Oral BID   enoxaparin (LOVENOX) injection  40 mg Subcutaneous Q24H   multivitamin with minerals  1 tablet Oral Daily   pantoprazole  40 mg Oral Daily   polyethylene glycol  17 g Oral BID   saccharomyces boulardii  250 mg Oral BID   Continuous Infusions:  cefTRIAXone (ROCEPHIN)  IV 2 g (08/24/22 0433)   metronidazole 500 mg (08/24/22 0146)   PRN Meds:.acetaminophen, diphenhydrAMINE, fentaNYL (SUBLIMAZE) injection, hydrocortisone cream, melatonin, oxyCODONE, prochlorperazine  Diet Orders (From admission, onward)      Start     Ordered   08/24/22 0815  Diet clear liquid Room service appropriate? Yes; Fluid consistency: Thin  Diet effective now       Question Answer Comment  Room service appropriate? Yes   Fluid consistency: Thin      08/24/22 0815            DVT prophylaxis: enoxaparin (LOVENOX) injection 40 mg Start: 08/22/22 1000   Lab Results  Component Value Date   PLT 225 08/24/2022      Code Status: Full Code  Family Communication: No family at bedside  Status is: Inpatient  Remains inpatient appropriate because: Persistent abdominal pain  Level of care: Telemetry  Consultants:  General surgery  Objective: Vitals:   08/23/22 0628 08/23/22 1309 08/24/22 0512 08/24/22 1209  BP: 109/74 137/88 108/80 126/81  Pulse: 88 75 87 81  Resp: 20 18 17 18   Temp: 98.6 F (37 C) 99.1 F (37.3 C) 99 F (37.2 C) 99.7 F (37.6 C)  TempSrc: Oral Oral  Oral  SpO2: 95% 98% 98% 98%  Weight:      Height:        Intake/Output Summary (Last 24 hours) at 08/24/2022 1243 Last data filed at 08/24/2022 1000 Gross per 24 hour  Intake 1193.66 ml  Output --  Net 1193.66 ml    Wt Readings from Last 3 Encounters:  08/21/22 112.5 kg  08/07/22 114.1 kg  04/17/22 122.7 kg    Examination:  Constitutional: NAD Eyes: lids and conjunctivae normal, no scleral icterus ENMT: mmm Neck: normal, supple Respiratory: clear to auscultation bilaterally, no wheezing, no crackles. Normal respiratory effort.  Cardiovascular: Regular rate and rhythm, no murmurs / rubs / gallops. No  LE edema. Abdomen: soft, no distention, no tenderness. Bowel sounds positive.  Skin: no rashes Neurologic: no focal deficits, equal strength   Data Reviewed: I have independently reviewed following labs and imaging studies   CBC Recent Labs  Lab 08/21/22 2306 08/22/22 0430 08/23/22 0418 08/24/22 0419  WBC 7.9 7.3 7.6 7.2  HGB 11.9* 10.7* 11.0* 10.6*  HCT 37.1 34.2* 34.7* 33.0*  PLT 234 217 220 225  MCV 89.0 91.2  89.2 89.7  MCH 28.5 28.5 28.3 28.8  MCHC 32.1 31.3 31.7 32.1  RDW 14.3 14.3 14.2 14.2  LYMPHSABS 1.9  --  1.3  --   MONOABS 0.8  --  0.9  --   EOSABS 0.1  --  0.1  --   BASOSABS 0.0  --  0.0  --      Recent Labs  Lab 08/21/22 2306 08/22/22 0430 08/23/22 0418 08/24/22 0419  NA 139  --  138 139  K 3.6  --  3.3* 3.5  CL 103  --  103 104  CO2 25  --  28 26  GLUCOSE 92  --  101* 81  BUN 10  --  6* 7*  CREATININE 0.80 0.79 0.80 0.83  CALCIUM 9.2  --  8.6* 8.7*  AST 18  --  16 14*  ALT 16  --  14 13  ALKPHOS 55  --  50 48  BILITOT 0.5  --  0.5 0.5  ALBUMIN 3.3*  --  2.8* 2.8*  MG  --   --  1.9  --      ------------------------------------------------------------------------------------------------------------------ No results for input(s): "CHOL", "HDL", "LDLCALC", "TRIG", "CHOLHDL", "LDLDIRECT" in the last 72 hours.  Lab Results  Component Value Date   HGBA1C 6.0 (H) 02/06/2022   ------------------------------------------------------------------------------------------------------------------ No results for input(s): "TSH", "T4TOTAL", "T3FREE", "THYROIDAB" in the last 72 hours.  Invalid input(s): "FREET3"  Cardiac Enzymes No results for input(s): "CKMB", "TROPONINI", "MYOGLOBIN" in the last 168 hours.  Invalid input(s): "CK" ------------------------------------------------------------------------------------------------------------------ No results found for: "BNP"  CBG: No results for input(s): "GLUCAP" in the last 168 hours.  Recent Results (from the past 240 hour(s))  Culture, blood (Routine X 2) w Reflex to ID Panel     Status: None (Preliminary result)   Collection Time: 08/22/22  3:58 PM   Specimen: BLOOD  Result Value Ref Range Status   Specimen Description   Final    BLOOD BLOOD RIGHT ARM Performed at [facility], [address], [place], [place] [address]    Special Requests   Final    BOTTLES DRAWN AEROBIC ONLY Blood Culture  adequate volume Performed at [facility], [address] [place], [place] [address]    Culture   Final    NO GROWTH 2 DAYS Performed at [facility], [address] [place], [place] [address]    Report Status PENDING  Incomplete  Culture, blood (Routine X 2) w Reflex to ID Panel     Status: None (Preliminary result)   Collection Time: 08/22/22  3:58 PM   Specimen: BLOOD  Result Value Ref Range Status   Specimen Description   Final    BLOOD BLOOD RIGHT HAND Performed at [facility], [address], [place], [place] [address]    Special Requests   Final    BOTTLES DRAWN AEROBIC ONLY Blood Culture adequate volume Performed at [facility], [address] [place], [place] [address]    Culture  Final    NO GROWTH 2 DAYS Performed at [facility], [address] [place], [place] [address]    Report Status PENDING  Incomplete     Radiology Studies: No results found.   Pamella Pert, MD, PhD [facility]  Between 7 am - 7 pm I am available, please contact me via Amion (for emergencies) or Securechat (non urgent messages)  Between 7 pm - 7 am I am not available, please contact night coverage MD/APP via Amion

## 2022-08-24 NOTE — Progress Notes (Signed)
[facility] Progress Note     Subjective: CC-  Left sided abdominal pain improving but having some more right lower pain today. Still no BM. Passing flatus. Ambulated yesterday. WBC 7.2, TMAX 99.1  Objective: Vital signs in last 24 hours: Temp:  [99 F (37.2 C)-99.1 F (37.3 C)] 99 F (37.2 C) (09/01 0512) Pulse Rate:  [75-87] 87 (09/01 0512) Resp:  [17-18] 17 (09/01 0512) BP: (108-137)/(80-88) 108/80 (09/01 0512) SpO2:  [98 %] 98 % (09/01 0512) Last BM Date :  (PTA)  Intake/Output from previous day: 08/31 0701 - 09/01 0700 In: 1483 [P.O.:240; I.V.:930.4; IV Piggyback:312.5] Out: -  Intake/Output this shift: No intake/output data recorded.  PE: Gen:  Alert, NAD, pleasant Abd: soft, mild LLQ TTP and moderate right sided abdominal tenderness without rebound or guarding. +BS, no masses, hernias, or organomegaly  Lab Results:  Recent Labs    08/23/22 0418 08/24/22 0419  WBC 7.6 7.2  HGB 11.0* 10.6*  HCT 34.7* 33.0*  PLT 220 225   BMET Recent Labs    08/23/22 0418 08/24/22 0419  NA 138 139  K 3.3* 3.5  CL 103 104  CO2 28 26  GLUCOSE 101* 81  BUN 6* 7*  CREATININE 0.80 0.83  CALCIUM 8.6* 8.7*   PT/INR No results for input(s): "LABPROT", "INR" in the last 72 hours. CMP     Component Value Date/Time   NA 139 08/24/2022 0419   K 3.5 08/24/2022 0419   CL 104 08/24/2022 0419   CO2 26 08/24/2022 0419   GLUCOSE 81 08/24/2022 0419   BUN 7 (L) 08/24/2022 0419   CREATININE 0.83 08/24/2022 0419   CALCIUM 8.7 (L) 08/24/2022 0419   PROT 7.0 08/24/2022 0419   ALBUMIN 2.8 (L) 08/24/2022 0419   AST 14 (L) 08/24/2022 0419   ALT 13 08/24/2022 0419   ALKPHOS 48 08/24/2022 0419   BILITOT 0.5 08/24/2022 0419   GFRNONAA >60 08/24/2022 0419   GFRAA >60 06/28/2020 0548   Lipase     Component Value Date/Time   LIPASE 26 08/21/2022 2306       Studies/Results: No results found.  Anti-infectives: Anti-infectives (From admission, onward)    Start      Dose/Rate Route Frequency Ordered Stop   08/23/22 0400  cefTRIAXone (ROCEPHIN) 2 g in sodium chloride 0.9 % 100 mL IVPB        2 g 200 mL/hr over 30 Minutes Intravenous Every 24 hours 08/22/22 0345     08/22/22 1400  metroNIDAZOLE (FLAGYL) IVPB 500 mg        500 mg 100 mL/hr over 60 Minutes Intravenous Every 12 hours 08/22/22 0345     08/22/22 0100  cefTRIAXone (ROCEPHIN) 2 g in sodium chloride 0.9 % 100 mL IVPB        2 g 200 mL/hr over 30 Minutes Intravenous  Once 08/22/22 0047 08/22/22 0226   08/22/22 0100  metroNIDAZOLE (FLAGYL) IVPB 500 mg        500 mg 100 mL/hr over 60 Minutes Intravenous  Once 08/22/22 0047 08/22/22 0412        Assessment/Plan Smoldering sigmoid diverticulitis - several bouts x10 years, last bout 1 year ago, this episode began 2 months ago and she has gone through a course of cipro/ flagyl followed by augmentin outpatient with no relief - CT 8/30 showed interval worsening of sigmoid diverticulitis, no free air or abscess.  - Left sided pain improving but still constipated. WBC WNL, TMAX 99.1. will give milk  of mag today. Ok for clear liquids. Continue IV rocephin/flagyl. Mobilize.    ID - rocephin/flagyl VTE - lovenox FEN - IVF, CLD Foley - none Follow up - if she improves can see Dr. 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

## 2022-08-25 ENCOUNTER — Inpatient Hospital Stay (HOSPITAL_COMMUNITY): Payer: Medicare HMO

## 2022-08-25 DIAGNOSIS — K5732 Diverticulitis of large intestine without perforation or abscess without bleeding: Secondary | ICD-10-CM | POA: Diagnosis not present

## 2022-08-25 DIAGNOSIS — R109 Unspecified abdominal pain: Secondary | ICD-10-CM | POA: Diagnosis not present

## 2022-08-25 MED ORDER — SODIUM CHLORIDE 0.9 % IV SOLN
INTRAVENOUS | Status: DC
Start: 2022-08-25 — End: 2022-09-01

## 2022-08-25 MED ORDER — SORBITOL 70 % SOLN
30.0000 mL | Freq: Once | Status: AC
Start: 2022-08-25 — End: 2022-08-25
  Administered 2022-08-25: 30 mL via ORAL
  Filled 2022-08-25: qty 30

## 2022-08-25 MED ORDER — SENNOSIDES-DOCUSATE SODIUM 8.6-50 MG PO TABS
2.0000 | ORAL_TABLET | Freq: Two times a day (BID) | ORAL | Status: DC
Start: 2022-08-25 — End: 2022-08-31
  Administered 2022-08-27 – 2022-08-30 (×7): 2 via ORAL
  Filled 2022-08-25 (×10): qty 2

## 2022-08-25 MED ORDER — POTASSIUM CHLORIDE CRYS ER 20 MEQ PO TBCR
40.0000 meq | EXTENDED_RELEASE_TABLET | Freq: Once | ORAL | Status: AC
  Administered 2022-08-25: 40 meq via ORAL
  Filled 2022-08-25: qty 2

## 2022-08-25 MED ORDER — ONDANSETRON HCL 4 MG/2ML IJ SOLN
4.0000 mg | Freq: Four times a day (QID) | INTRAMUSCULAR | Status: DC | PRN
  Administered 2022-08-25 – 2022-08-31 (×9): 4 mg via INTRAVENOUS
  Filled 2022-08-25 (×10): qty 2

## 2022-08-25 NOTE — Progress Notes (Signed)
   Subjective/Chief Complaint: Was feeling better but had some LLQ pain last night. Not taking much po yet   Objective: Vital signs in last 24 hours: Temp:  [98.2 F (36.8 C)-100.8 F (38.2 C)] 98.2 F (36.8 C) (09/02 0543) Pulse Rate:  [69-81] 69 (09/02 0543) Resp:  [18-20] 20 (09/02 0543) BP: (126-138)/(81-89) 138/89 (09/02 0543) SpO2:  [98 %-99 %] 99 % (09/02 0543) Last BM Date :  (pta)  Intake/Output from previous day: 09/01 0701 - 09/02 0700 In: 820 [P.O.:720; IV Piggyback:100] Out: -  Intake/Output this shift: Total I/O In: 320 [P.O.:120; IV Piggyback:200] Out: -   General appearance: alert and cooperative Resp: clear to auscultation bilaterally Cardio: regular rate and rhythm GI: soft, mild LLQ pain. No peritonitis  Lab Results:  Recent Labs    08/23/22 0418 08/24/22 0419  WBC 7.6 7.2  HGB 11.0* 10.6*  HCT 34.7* 33.0*  PLT 220 225   BMET Recent Labs    08/23/22 0418 08/24/22 0419  NA 138 139  K 3.3* 3.5  CL 103 104  CO2 28 26  GLUCOSE 101* 81  BUN 6* 7*  CREATININE 0.80 0.83  CALCIUM 8.6* 8.7*   PT/INR No results for input(s): "LABPROT", "INR" in the last 72 hours. ABG No results for input(s): "PHART", "HCO3" in the last 72 hours.  Invalid input(s): "PCO2", "PO2"  Studies/Results: No results found.  Anti-infectives: Anti-infectives (From admission, onward)    Start     Dose/Rate Route Frequency Ordered Stop   08/23/22 0400  cefTRIAXone (ROCEPHIN) 2 g in sodium chloride 0.9 % 100 mL IVPB        2 g 200 mL/hr over 30 Minutes Intravenous Every 24 hours 08/22/22 0345     08/22/22 1400  metroNIDAZOLE (FLAGYL) IVPB 500 mg        500 mg 100 mL/hr over 60 Minutes Intravenous Every 12 hours 08/22/22 0345     08/22/22 0100  cefTRIAXone (ROCEPHIN) 2 g in sodium chloride 0.9 % 100 mL IVPB        2 g 200 mL/hr over 30 Minutes Intravenous  Once 08/22/22 0047 08/22/22 0226   08/22/22 0100  metroNIDAZOLE (FLAGYL) IVPB 500 mg        500 mg 100  mL/hr over 60 Minutes Intravenous  Once 08/22/22 0047 08/22/22 0412       Assessment/Plan: s/p * No surgery found * Advance diet Continue IV abx Smoldering sigmoid diverticulitis - several bouts x10 years, last bout 1 year ago, this episode began 2 months ago and she has gone through a course of cipro/ flagyl followed by augmentin outpatient with no relief - CT 8/30 showed interval worsening of sigmoid diverticulitis, no free air or abscess.  - Left sided pain improving but still constipated. WBC WNL, TMAX 99.1. will give milk of mag today. Ok for clear liquids. Continue IV rocephin/flagyl. Mobilize. getting colace and miralax   ID - rocephin/flagyl VTE - lovenox FEN - IVF, CLD Foley - none Follow up - if she improves can see Dr. 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

## 2022-08-25 NOTE — Progress Notes (Signed)
PROGRESS NOTE  Kristin Gibson HFW:[id number] DOB: 29-Feb-1956 DOA: 08/21/2022 PCP: Woodroe Chen, MD   LOS: 3 days   Brief Narrative / Interim history: 66 year old female with history of gastric sleeve surgery for weight loss February 2093, recurrent diverticulitis, recently completed 2 rounds of antibiotics comes to the hospital with recurrent abdominal pain, left lower quadrant, as well as subjective fever and chills at home.  Work-up in the ED revealed sigmoid diverticulitis  Pertinent data: Still complains of shooting abdominal pain in the left lower quadrant, but also feels very constipated and has not had a bowel movement yet  Subjective / 24h Interval events: Feels like her pain is a little bit better.  Assesement and Plan: Principal problem Recurrent sigmoid diverticulitis, failed outpatient treatment-patient has a history of recurrent diverticulitis, previously seen by surgery with plans for partial colectomy but this was postponed.  Persistent pain however constipation likely to play a role.  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  Recheck tomorrow  Scheduled Meds:  amLODipine  10 mg Oral Q supper   cloNIDine  0.1 mg Oral Q supper   docusate sodium  100 mg Oral BID   enoxaparin (LOVENOX) injection  40 mg Subcutaneous Q24H   multivitamin with minerals  1 tablet Oral Daily   pantoprazole  40 mg Oral Daily   polyethylene glycol  17 g Oral BID   saccharomyces boulardii  250 mg Oral BID   Continuous Infusions:  sodium chloride     cefTRIAXone (ROCEPHIN)  IV 2 g (08/25/22 0430)   metronidazole 500 mg (08/25/22 0259)   PRN Meds:.acetaminophen, diphenhydrAMINE,  fentaNYL (SUBLIMAZE) injection, hydrocortisone cream, melatonin, oxyCODONE, prochlorperazine  Diet Orders (From admission, onward)     Start     Ordered   08/24/22 0815  Diet clear liquid Room service appropriate? Yes; Fluid consistency: Thin  Diet effective now       Question Answer Comment  Room service appropriate? Yes   Fluid consistency: Thin      08/24/22 0815            DVT prophylaxis: enoxaparin (LOVENOX) injection 40 mg Start: 08/22/22 1000   Lab Results  Component Value Date   PLT 225 08/24/2022      Code Status: Full Code  Family Communication: No family at bedside  Status is: Inpatient  Remains inpatient appropriate because: Persistent abdominal pain  Level of care: Med-Surg  Consultants:  General surgery  Objective: Vitals:   08/24/22 0512 08/24/22 1209 08/24/22 2037 08/25/22 0543  BP: 108/80 126/81 128/84 138/89  Pulse: 87 81 79 69  Resp: 17 18 20 20   Temp: 99 F (37.2 C) 99.7 F (37.6 C) (!) 100.8 F (38.2 C) 98.2 F (36.8 C)  TempSrc:  Oral Oral Oral  SpO2: 98% 98% 98% 99%  Weight:      Height:        Intake/Output Summary (Last 24 hours) at 08/25/2022 1031 Last data filed at 08/25/2022 0906 Gross per 24 hour  Intake 1380 ml  Output --  Net 1380 ml    Wt Readings from Last 3 Encounters:  08/21/22 112.5 kg  08/07/22 114.1 kg  04/17/22 122.7 kg    Examination:  Constitutional: NAD Eyes: lids and conjunctivae normal, no  scleral icterus ENMT: mmm Neck: normal, supple Respiratory: clear to auscultation bilaterally, no wheezing, no crackles.  Cardiovascular: Regular rate and rhythm, no murmurs / rubs / gallops. No LE edema. Skin: no rashes Neurologic: no focal deficits, equal strength   Data Reviewed: I have independently reviewed following labs and imaging studies   CBC Recent Labs  Lab 08/21/22 2306 08/22/22 0430 08/23/22 0418 08/24/22 0419  WBC 7.9 7.3 7.6 7.2  HGB 11.9* 10.7* 11.0* 10.6*  HCT 37.1 34.2* 34.7*  33.0*  PLT 234 217 220 225  MCV 89.0 91.2 89.2 89.7  MCH 28.5 28.5 28.3 28.8  MCHC 32.1 31.3 31.7 32.1  RDW 14.3 14.3 14.2 14.2  LYMPHSABS 1.9  --  1.3  --   MONOABS 0.8  --  0.9  --   EOSABS 0.1  --  0.1  --   BASOSABS 0.0  --  0.0  --      Recent Labs  Lab 08/21/22 2306 08/22/22 0430 08/23/22 0418 08/24/22 0419  NA 139  --  138 139  K 3.6  --  3.3* 3.5  CL 103  --  103 104  CO2 25  --  28 26  GLUCOSE 92  --  101* 81  BUN 10  --  6* 7*  CREATININE 0.80 0.79 0.80 0.83  CALCIUM 9.2  --  8.6* 8.7*  AST 18  --  16 14*  ALT 16  --  14 13  ALKPHOS 55  --  50 48  BILITOT 0.5  --  0.5 0.5  ALBUMIN 3.3*  --  2.8* 2.8*  MG  --   --  1.9  --      ------------------------------------------------------------------------------------------------------------------ No results for input(s): "CHOL", "HDL", "LDLCALC", "TRIG", "CHOLHDL", "LDLDIRECT" in the last 72 hours.  Lab Results  Component Value Date   HGBA1C 6.0 (H) 02/06/2022   ------------------------------------------------------------------------------------------------------------------ No results for input(s): "TSH", "T4TOTAL", "T3FREE", "THYROIDAB" in the last 72 hours.  Invalid input(s): "FREET3"  Cardiac Enzymes No results for input(s): "CKMB", "TROPONINI", "MYOGLOBIN" in the last 168 hours.  Invalid input(s): "CK" ------------------------------------------------------------------------------------------------------------------ No results found for: "BNP"  CBG: No results for input(s): "GLUCAP" in the last 168 hours.  Recent Results (from the past 240 hour(s))  Culture, blood (Routine X 2) w Reflex to ID Panel     Status: None (Preliminary result)   Collection Time: 08/22/22  3:58 PM   Specimen: BLOOD  Result Value Ref Range Status   Specimen Description   Final    BLOOD BLOOD RIGHT ARM Performed at [facility], [address], [place], [place] [address]    Special Requests   Final     BOTTLES DRAWN AEROBIC ONLY Blood Culture adequate volume Performed at [facility], [address] [place], [place] [address]    Culture   Final    NO GROWTH 2 DAYS Performed at [facility], [address] [place], [place] [address]    Report Status PENDING  Incomplete  Culture, blood (Routine X 2) w Reflex to ID Panel     Status: None (Preliminary result)   Collection Time: 08/22/22  3:58 PM   Specimen: BLOOD  Result Value Ref Range Status   Specimen Description   Final    BLOOD BLOOD RIGHT HAND Performed at [facility], [address] [place], [place] [address]    Special Requests   Final    BOTTLES DRAWN AEROBIC ONLY Blood Culture adequate volume  Performed at [facility], [address], [place], [place] [address]    Culture   Final    NO GROWTH 2 DAYS Performed at [facility], [address] [place], [place] [address]    Report Status PENDING  Incomplete     Radiology Studies: No results found.   Pamella Pert, MD, PhD [facility]  Between 7 am - 7 pm I am available, please contact me via Amion (for emergencies) or Securechat (non urgent messages)  Between 7 pm - 7 am I am not available, please contact night coverage MD/APP via Amion

## 2022-08-26 ENCOUNTER — Encounter (HOSPITAL_COMMUNITY): Payer: Self-pay | Admitting: Internal Medicine

## 2022-08-26 ENCOUNTER — Inpatient Hospital Stay (HOSPITAL_COMMUNITY): Payer: Medicare HMO

## 2022-08-26 DIAGNOSIS — N281 Cyst of kidney, acquired: Secondary | ICD-10-CM | POA: Diagnosis not present

## 2022-08-26 DIAGNOSIS — R1110 Vomiting, unspecified: Secondary | ICD-10-CM | POA: Diagnosis not present

## 2022-08-26 DIAGNOSIS — K5732 Diverticulitis of large intestine without perforation or abscess without bleeding: Secondary | ICD-10-CM | POA: Diagnosis not present

## 2022-08-26 LAB — BASIC METABOLIC PANEL
Anion gap: 9 (ref 5–15)
BUN: 7 mg/dL — ABNORMAL LOW (ref 8–23)
CO2: 24 mmol/L (ref 22–32)
Calcium: 8.5 mg/dL — ABNORMAL LOW (ref 8.9–10.3)
Chloride: 104 mmol/L (ref 98–111)
Creatinine, Ser: 0.74 mg/dL (ref 0.44–1.00)
GFR, Estimated: >60 mL/min (ref >60)
Glucose, Bld: 88 mg/dL (ref 70–99)
Potassium: 3.4 mmol/L — ABNORMAL LOW (ref 3.5–5.1)
Sodium: 137 mmol/L (ref 135–145)

## 2022-08-26 LAB — CBC
HCT: 34.9 % — ABNORMAL LOW (ref 36.0–46.0)
Hemoglobin: 11 g/dL — ABNORMAL LOW (ref 12.0–15.0)
MCH: 28.2 pg (ref 26.0–34.0)
MCHC: 31.5 g/dL (ref 30.0–36.0)
MCV: 89.5 fL (ref 80.0–100.0)
Platelets: 261 10*3/uL (ref 150–400)
RBC: 3.9 MIL/uL (ref 3.87–5.11)
RDW: 14.2 % (ref 11.5–15.5)
WBC: 8 10*3/uL (ref 4.0–10.5)
nRBC: 0 % (ref 0.0–0.2)

## 2022-08-26 LAB — MAGNESIUM: Magnesium: 2.1 mg/dL (ref 1.7–2.4)

## 2022-08-26 LAB — PHOSPHORUS: Phosphorus: 3.9 mg/dL (ref 2.5–4.6)

## 2022-08-26 MED ORDER — SODIUM CHLORIDE 0.9 % IV SOLN
2.0000 g | Freq: Three times a day (TID) | INTRAVENOUS | Status: DC
  Administered 2022-08-26 – 2022-09-02 (×20): 2 g via INTRAVENOUS
  Filled 2022-08-26 (×20): qty 12.5

## 2022-08-26 MED ORDER — KETOROLAC TROMETHAMINE 15 MG/ML IJ SOLN
15.0000 mg | Freq: Three times a day (TID) | INTRAMUSCULAR | Status: DC
Start: 2022-08-26 — End: 2022-08-26

## 2022-08-26 MED ORDER — KETOROLAC TROMETHAMINE 15 MG/ML IJ SOLN
15.0000 mg | Freq: Three times a day (TID) | INTRAMUSCULAR | Status: AC
  Administered 2022-08-26 – 2022-08-27 (×4): 15 mg via INTRAVENOUS
  Filled 2022-08-26 (×4): qty 1

## 2022-08-26 MED ORDER — IOHEXOL 300 MG/ML  SOLN
100.0000 mL | Freq: Once | INTRAMUSCULAR | Status: AC | PRN
  Administered 2022-08-26: 100 mL via INTRAVENOUS

## 2022-08-26 MED ORDER — KETOROLAC TROMETHAMINE 30 MG/ML IJ SOLN
30.0000 mg | Freq: Three times a day (TID) | INTRAMUSCULAR | Status: DC
  Administered 2022-08-26: 30 mg via INTRAVENOUS
  Filled 2022-08-26: qty 1

## 2022-08-26 MED ORDER — POTASSIUM CHLORIDE CRYS ER 20 MEQ PO TBCR
40.0000 meq | EXTENDED_RELEASE_TABLET | Freq: Once | ORAL | Status: AC
Start: 2022-08-26 — End: 2022-08-26
  Administered 2022-08-26: 40 meq via ORAL
  Filled 2022-08-26: qty 2

## 2022-08-26 MED ORDER — ACETAMINOPHEN 10 MG/ML IV SOLN
1000.0000 mg | Freq: Once | INTRAVENOUS | Status: AC
Start: 2022-08-26 — End: 2022-08-27

## 2022-08-26 NOTE — Progress Notes (Signed)
PROGRESS NOTE  Kristin Gibson NTZ:[id number] DOB: 05-19-1956 DOA: 08/21/2022 PCP: Woodroe Chen, MD   LOS: 4 days   Brief Narrative / Interim history: 66 year old female with history of gastric sleeve surgery for weight loss February 2093, recurrent diverticulitis, recently completed 2 rounds of antibiotics comes to the hospital with recurrent abdominal pain, left lower quadrant, as well as subjective fever and chills at home.  Work-up in the ED revealed sigmoid diverticulitis  Pertinent data: 8/30-CT abdomen pelvis-interval worsening of sigmoid diverticulitis, no free air or abscess. 9/2-CT abdomen pelvis-circumferential wall thickening of the proximal sigmoid colon, felt to be progressive since prior CT, increasing distention and stool in the colon concerning for stricture/mechanical obstruction  Subjective / 24h Interval events: She feels better this morning than yesterday afternoon.  Assesement and Plan: Principal problem Recurrent sigmoid diverticulitis, failed outpatient treatment-patient has a history of recurrent diverticulitis, previously seen by surgery with plans for partial colectomy but this was postponed.  Persistent pain however constipation likely to play a role.  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  Supplementing continue to monitor  Scheduled Meds:  amLODipine  10 mg Oral Q supper   cloNIDine  0.1 mg Oral Q supper   enoxaparin (LOVENOX) injection  40 mg Subcutaneous Q24H   ketorolac  30 mg Intravenous Q8H   multivitamin with minerals  1 tablet Oral Daily   pantoprazole  40 mg Oral Daily   polyethylene glycol  17 g Oral BID   saccharomyces boulardii  250 mg  Oral BID   senna-docusate  2 tablet Oral BID   Continuous Infusions:  sodium chloride 50 mL/hr at 08/25/22 1036   acetaminophen     ceFEPime (MAXIPIME) IV     metronidazole 500 mg (08/26/22 0111)   PRN Meds:.acetaminophen, diphenhydrAMINE, fentaNYL (SUBLIMAZE) injection, hydrocortisone cream, melatonin, ondansetron (ZOFRAN) IV, oxyCODONE, prochlorperazine  Diet Orders (From admission, onward)     Start     Ordered   08/25/22 1514  Diet NPO time specified Except for: Sips with Meds, Ice Chips  Diet effective now       Question Answer Comment  Except for Sips with Meds   Except for Ice Chips      08/25/22 1513            DVT prophylaxis: enoxaparin (LOVENOX) injection 40 mg Start: 08/22/22 1000   Lab Results  Component Value Date   PLT 261 08/26/2022      Code Status: Full Code  Family Communication: No family at bedside  Status is: Inpatient  Remains inpatient appropriate because: Persistent abdominal pain  Level of care: Med-Surg  Consultants:  General surgery  Objective: Vitals:   08/25/22 1438 08/25/22 1619 08/25/22 2039 08/26/22 0637  BP: (!) 148/96 (!) 141/89 124/89 131/83  Pulse: (!) 51 89 87 79  Resp: 18  20   Temp: 99.6 F (37.6 C) 99.5 F (37.5 C) 98.6 F (37 C) 99.1 F (37.3 C)  TempSrc: Oral Oral  Oral  SpO2: 99% 92% 94% 98%  Weight:      Height:        Intake/Output Summary (Last 24 hours) at 08/26/2022 1029 Last data filed at 08/26/2022 0425 Gross per 24 hour  Intake 1268.93 ml  Output --  Net 1268.93  ml    Wt Readings from Last 3 Encounters:  08/21/22 112.5 kg  08/07/22 114.1 kg  04/17/22 122.7 kg    Examination:  Constitutional: NAD Eyes: lids and conjunctivae normal, no scleral icterus ENMT: mmm Neck: normal, supple Respiratory: clear to auscultation bilaterally, no wheezing, no crackles. Normal respiratory effort.  Cardiovascular: Regular rate and rhythm, no murmurs / rubs / gallops. No LE edema. Abdomen: soft, diffuse  tenderness mainly left lower quadrant, no guarding or rebound Skin: no rashes Neurologic: no focal deficits, equal strength   Data Reviewed: I have independently reviewed following labs and imaging studies   CBC Recent Labs  Lab 08/21/22 2306 08/22/22 0430 08/23/22 0418 08/24/22 0419 08/26/22 0354  WBC 7.9 7.3 7.6 7.2 8.0  HGB 11.9* 10.7* 11.0* 10.6* 11.0*  HCT 37.1 34.2* 34.7* 33.0* 34.9*  PLT 234 217 220 225 261  MCV 89.0 91.2 89.2 89.7 89.5  MCH 28.5 28.5 28.3 28.8 28.2  MCHC 32.1 31.3 31.7 32.1 31.5  RDW 14.3 14.3 14.2 14.2 14.2  LYMPHSABS 1.9  --  1.3  --   --   MONOABS 0.8  --  0.9  --   --   EOSABS 0.1  --  0.1  --   --   BASOSABS 0.0  --  0.0  --   --      Recent Labs  Lab 08/21/22 2306 08/22/22 0430 08/23/22 0418 08/24/22 0419 08/26/22 0354  NA 139  --  138 139 137  K 3.6  --  3.3* 3.5 3.4*  CL 103  --  103 104 104  CO2 25  --  28 26 24   GLUCOSE 92  --  101* 81 88  BUN 10  --  6* 7* 7*  CREATININE 0.80 0.79 0.80 0.83 0.74  CALCIUM 9.2  --  8.6* 8.7* 8.5*  AST 18  --  16 14*  --   ALT 16  --  14 13  --   ALKPHOS 55  --  50 48  --   BILITOT 0.5  --  0.5 0.5  --   ALBUMIN 3.3*  --  2.8* 2.8*  --   MG  --   --  1.9  --  2.1     ------------------------------------------------------------------------------------------------------------------ No results for input(s): "CHOL", "HDL", "LDLCALC", "TRIG", "CHOLHDL", "LDLDIRECT" in the last 72 hours.  Lab Results  Component Value Date   HGBA1C 6.0 (H) 02/06/2022   ------------------------------------------------------------------------------------------------------------------ No results for input(s): "TSH", "T4TOTAL", "T3FREE", "THYROIDAB" in the last 72 hours.  Invalid input(s): "FREET3"  Cardiac Enzymes No results for input(s): "CKMB", "TROPONINI", "MYOGLOBIN" in the last 168 hours.  Invalid input(s):  "CK" ------------------------------------------------------------------------------------------------------------------ No results found for: "BNP"  CBG: No results for input(s): "GLUCAP" in the last 168 hours.  Recent Results (from the past 240 hour(s))  Culture, blood (Routine X 2) w Reflex to ID Panel     Status: None (Preliminary result)   Collection Time: 08/22/22  3:58 PM   Specimen: BLOOD  Result Value Ref Range Status   Specimen Description   Final    BLOOD BLOOD RIGHT ARM Performed at [facility], [address], [place], [place]    Special Requests   Final    BOTTLES DRAWN AEROBIC ONLY Blood Culture adequate volume Performed at [facility], [address], [place], [place]    Culture   Final    NO GROWTH 3 DAYS Performed at [facility],  [address] [place], [place] [address]    Report Status PENDING  Incomplete  Culture, blood (Routine X 2) w Reflex to ID Panel     Status: None (Preliminary result)   Collection Time: 08/22/22  3:58 PM   Specimen: BLOOD  Result Value Ref Range Status   Specimen Description   Final    BLOOD BLOOD RIGHT HAND Performed at [facility], [address], [place], [place] [address]    Special Requests   Final    BOTTLES DRAWN AEROBIC ONLY Blood Culture adequate volume Performed at [facility], [address] [place], [place] [address]    Culture   Final    NO GROWTH 3 DAYS Performed at [facility], [address], [place], [place] [address]    Report Status PENDING  Incomplete     Radiology Studies: CT ABDOMEN PELVIS W CONTRAST  Result Date: 08/26/2022 CLINICAL DATA:  Nausea and vomiting.  Left lower quadrant pain. EXAM: CT ABDOMEN AND PELVIS WITH CONTRAST TECHNIQUE: Multidetector CT imaging of the abdomen and pelvis was performed using the standard protocol following bolus administration of intravenous contrast.  RADIATION DOSE REDUCTION: This exam was performed according to the departmental dose-optimization program which includes automated exposure control, adjustment of the mA and/or kV according to patient size and/or use of iterative reconstruction technique. CONTRAST:  OMNIPAQUE IOHEXOL 300 MG/ML  SOLN COMPARISON:  08/22/2022 FINDINGS: Lower chest: Unremarkable. Hepatobiliary: No focal liver abnormality is seen. No gallstones, gallbladder wall thickening, or biliary dilatation. Pancreas: Unremarkable. No pancreatic ductal dilatation or surrounding inflammatory changes. Spleen: Normal in size without focal abnormality. Adrenals/Urinary Tract: No adrenal nodule or mass. Stable bilateral renal cysts measuring up to 3.4 cm in the right kidney 2.2 cm left kidney. No hydronephrosis. No evidence for hydroureter. The urinary bladder appears normal for the degree of distention. Stomach/Bowel: Surgical changes again noted in the stomach. Duodenum is normally positioned as is the ligament of Treitz. No small bowel wall thickening. No small bowel dilatation. Cecum, ascending colon and transverse colon are distended with fluid. Descending colon also mildly distended and fluid-filled. There is marked circumferential wall thickening in the proximal sigmoid colon with pericolonic edema/inflammation in a background of left colonic diverticulosis. Imaging features may reflect diverticulitis although a relatively long segment of the sigmoid colon is involved and infectious/inflammatory colitis cannot be excluded. There is edema and inflammation in the sigmoid mesocolon without evidence for extraluminal gas or abscess. Vascular/Lymphatic: There is mild atherosclerotic calcification of the abdominal aorta without aneurysm. There is no gastrohepatic or hepatoduodenal ligament lymphadenopathy. No retroperitoneal or mesenteric lymphadenopathy. No pelvic sidewall lymphadenopathy. Increased number of lymph nodes are seen in the  retroperitoneal space and upper pelvic sidewall bilaterally, likely reactive. Reproductive: Uterus surgically absent.  There is no adnexal mass. Other: No intraperitoneal free fluid. Musculoskeletal: No worrisome lytic or sclerotic osseous abnormality. IMPRESSION: 1. Marked circumferential wall thickening in the proximal sigmoid colon with pericolonic edema/inflammation in a background of left colonic diverticulosis. Findings are progressive since the 08/22/2022 exam and while imaging features may reflect diverticulitis, a relatively long segment of the sigmoid colon is involved and infectious/inflammatory colitis is also a consideration. Neoplasm is not entirely excluded. No evidence for extraluminal gas or abscess. 2. Increasing distension and increasing stool volume in the colon proximal to the sigmoid segment. While this may be functional due to the changes in the sigmoid colon, stricture/mechanical obstruction cannot be excluded. 3. Increased number of lymph nodes  in the retroperitoneal space and upper pelvic sidewall bilaterally, likely reactive. 4. Bilateral renal cysts. 5. Aortic Atherosclerosis (ICD10-I70.0). Increasing Electronically Signed   By: Kennith Center M.D.   On: 08/26/2022 05:25   DG Abd Portable 2V  Result Date: 08/25/2022 CLINICAL DATA:  Abdominal pain.  Diverticulitis on recent CT. EXAM: PORTABLE ABDOMEN - 2 VIEW COMPARISON:  CT 08/22/2022 FINDINGS: Divided portable semi upright views of the abdomen obtained. There is no obvious free intra-abdominal air. Increasing gaseous distension of transverse colon. Air-filled loop of bowel in the left abdomen, unclear if this represents descending colon or small bowel, measures 3.8 cm. There is an adjacent air-filled loops of prominent small bowel. 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 There are multiple phleboliths in the abdomen when compared to prior CT. Soft tissue attenuation from habitus limits detailed assessment.  IMPRESSION: 1. Increasing gaseous distension of transverse colon, suggesting colonic ileus. 2. Air-filled loop of bowel in the left abdomen, unclear if this represents descending colon or small bowel. 3. No obvious free air. Electronically Signed   By: Narda Rutherford M.D.   On: 08/25/2022 16:54     Pamella Pert, MD, PhD [facility]  Between 7 am - 7 pm I am available, please contact me via Amion (for emergencies) or Securechat (non urgent messages)  Between 7 pm - 7 am I am not available, please contact night coverage MD/APP via Amion

## 2022-08-26 NOTE — Plan of Care (Signed)
  Problem: Education: Goal: Knowledge of General Education information will improve Description: Including pain rating scale, medication(s)/side effects and non-pharmacologic comfort measures Outcome: Progressing   Problem: Pain Managment: Goal: General experience of comfort will improve Outcome: Progressing   

## 2022-08-26 NOTE — Progress Notes (Signed)
Subjective/Chief Complaint: Pain returned yesterday. No fever. WBC normal. Repeat CT shows continued diverticulitis but no evidence of perf or abscess   Objective: Vital signs in last 24 hours: Temp:  [98.6 F (37 C)-99.6 F (37.6 C)] 99.1 F (37.3 C) (09/03 4431) Pulse Rate:  [51-89] 79 (09/03 0637) Resp:  [18-20] 20 (09/02 2039) BP: (124-148)/(83-96) 131/83 (09/03 0637) SpO2:  [92 %-99 %] 98 % (09/03 0637) Last BM Date :  (pta)  Intake/Output from previous day: 09/02 0701 - 09/03 0700 In: 2068.9 [P.O.:960; I.V.:708.9; IV Piggyback:400] Out: -  Intake/Output this shift: No intake/output data recorded.  General appearance: alert and cooperative Resp: clear to auscultation bilaterally Cardio: regular rate and rhythm GI: soft, moderate focal LLQ tenderness  Lab Results:  Recent Labs    08/24/22 0419 08/26/22 0354  WBC 7.2 8.0  HGB 10.6* 11.0*  HCT 33.0* 34.9*  PLT 225 261   BMET Recent Labs    08/24/22 0419 08/26/22 0354  NA 139 137  K 3.5 3.4*  CL 104 104  CO2 26 24  GLUCOSE 81 88  BUN 7* 7*  CREATININE 0.83 0.74  CALCIUM 8.7* 8.5*   PT/INR No results for input(s): "LABPROT", "INR" in the last 72 hours. ABG No results for input(s): "PHART", "HCO3" in the last 72 hours.  Invalid input(s): "PCO2", "PO2"  Studies/Results: CT ABDOMEN PELVIS W CONTRAST  Result Date: 08/26/2022 CLINICAL DATA:  Nausea and vomiting.  Left lower quadrant pain. EXAM: CT ABDOMEN AND PELVIS WITH CONTRAST TECHNIQUE: Multidetector CT imaging of the abdomen and pelvis was performed using the standard protocol following bolus administration of intravenous contrast. RADIATION DOSE REDUCTION: This exam was performed according to the departmental dose-optimization program which includes automated exposure control, adjustment of the mA and/or kV according to patient size and/or use of iterative reconstruction technique. CONTRAST:  OMNIPAQUE IOHEXOL 300 MG/ML  SOLN COMPARISON:   08/22/2022 FINDINGS: Lower chest: Unremarkable. Hepatobiliary: No focal liver abnormality is seen. No gallstones, gallbladder wall thickening, or biliary dilatation. Pancreas: Unremarkable. No pancreatic ductal dilatation or surrounding inflammatory changes. Spleen: Normal in size without focal abnormality. Adrenals/Urinary Tract: No adrenal nodule or mass. Stable bilateral renal cysts measuring up to 3.4 cm in the right kidney 2.2 cm left kidney. No hydronephrosis. No evidence for hydroureter. The urinary bladder appears normal for the degree of distention. Stomach/Bowel: Surgical changes again noted in the stomach. Duodenum is normally positioned as is the ligament of Treitz. No small bowel wall thickening. No small bowel dilatation. Cecum, ascending colon and transverse colon are distended with fluid. Descending colon also mildly distended and fluid-filled. There is marked circumferential wall thickening in the proximal sigmoid colon with pericolonic edema/inflammation in a background of left colonic diverticulosis. Imaging features may reflect diverticulitis although a relatively long segment of the sigmoid colon is involved and infectious/inflammatory colitis cannot be excluded. There is edema and inflammation in the sigmoid mesocolon without evidence for extraluminal gas or abscess. Vascular/Lymphatic: There is mild atherosclerotic calcification of the abdominal aorta without aneurysm. There is no gastrohepatic or hepatoduodenal ligament lymphadenopathy. No retroperitoneal or mesenteric lymphadenopathy. No pelvic sidewall lymphadenopathy. Increased number of lymph nodes are seen in the retroperitoneal space and upper pelvic sidewall bilaterally, likely reactive. Reproductive: Uterus surgically absent.  There is no adnexal mass. Other: No intraperitoneal free fluid. Musculoskeletal: No worrisome lytic or sclerotic osseous abnormality. IMPRESSION: 1. Marked circumferential wall thickening in the proximal sigmoid  colon with pericolonic edema/inflammation in a background of left colonic diverticulosis. Findings  are progressive since the 08/22/2022 exam and while imaging features may reflect diverticulitis, a relatively long segment of the sigmoid colon is involved and infectious/inflammatory colitis is also a consideration. Neoplasm is not entirely excluded. No evidence for extraluminal gas or abscess. 2. Increasing distension and increasing stool volume in the colon proximal to the sigmoid segment. While this may be functional due to the changes in the sigmoid colon, stricture/mechanical obstruction cannot be excluded. 3. Increased number of lymph nodes in the retroperitoneal space and upper pelvic sidewall bilaterally, likely reactive. 4. Bilateral renal cysts. 5. Aortic Atherosclerosis (ICD10-I70.0). Increasing Electronically Signed   By: Kennith Center M.D.   On: 08/26/2022 05:25   DG Abd Portable 2V  Result Date: 08/25/2022 CLINICAL DATA:  Abdominal pain.  Diverticulitis on recent CT. EXAM: PORTABLE ABDOMEN - 2 VIEW COMPARISON:  CT 08/22/2022 FINDINGS: Divided portable semi upright views of the abdomen obtained. There is no obvious free intra-abdominal air. Increasing gaseous distension of transverse colon. Air-filled loop of bowel in the left abdomen, unclear if this represents descending colon or small bowel, measures 3.8 cm. There is an adjacent air-filled loops of prominent small bowel. 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 There are multiple phleboliths in the abdomen when compared to prior CT. Soft tissue attenuation from habitus limits detailed assessment. IMPRESSION: 1. Increasing gaseous distension of transverse colon, suggesting colonic ileus. 2. Air-filled loop of bowel in the left abdomen, unclear if this represents descending colon or small bowel. 3. No obvious free air. Electronically Signed   By: Narda Rutherford M.D.   On: 08/25/2022 16:54     Anti-infectives: Anti-infectives (From admission, onward)    Start     Dose/Rate Route Frequency Ordered Stop   08/26/22 1400  ceFEPIme (MAXIPIME) 2 g in sodium chloride 0.9 % 100 mL IVPB        2 g 200 mL/hr over 30 Minutes Intravenous Every 8 hours 08/26/22 0848     08/23/22 0400  cefTRIAXone (ROCEPHIN) 2 g in sodium chloride 0.9 % 100 mL IVPB  Status:  Discontinued        2 g 200 mL/hr over 30 Minutes Intravenous Every 24 hours 08/22/22 0345 08/26/22 0848   08/22/22 1400  metroNIDAZOLE (FLAGYL) IVPB 500 mg        500 mg 100 mL/hr over 60 Minutes Intravenous Every 12 hours 08/22/22 0345     08/22/22 0100  cefTRIAXone (ROCEPHIN) 2 g in sodium chloride 0.9 % 100 mL IVPB        2 g 200 mL/hr over 30 Minutes Intravenous  Once 08/22/22 0047 08/22/22 0226   08/22/22 0100  metroNIDAZOLE (FLAGYL) IVPB 500 mg        500 mg 100 mL/hr over 60 Minutes Intravenous  Once 08/22/22 0047 08/22/22 0412       Assessment/Plan: s/p * No surgery found * Continue bowel rest Will switch abx to IV cefepime/flagyl and monitor closely. If she does not start to improve soon then she may need colectomy and colostomy Smoldering sigmoid diverticulitis - several bouts x10 years, last bout 1 year ago, this episode began 2 months ago and she has gone through a course of cipro/ flagyl followed by augmentin outpatient with no relief - CT 8/30 showed interval worsening of sigmoid diverticulitis, no free air or abscess.  - Left sided pain improving but still constipated. WBC WNL, TMAX 99.1. will give milk of mag today. Ok for clear liquids. Continue IV rocephin/flagyl. Mobilize. getting colace  and miralax   ID - cefepime/flagyl VTE - lovenox FEN - IVF, CLD Foley - none Follow up - if she improves can see Dr. Cliffton Asters (has seen him for this before), also needs GI follow up for colonoscopy  LOS: 4 days    Kristin Gibson 08/26/2022

## 2022-08-27 DIAGNOSIS — K5732 Diverticulitis of large intestine without perforation or abscess without bleeding: Secondary | ICD-10-CM | POA: Diagnosis not present

## 2022-08-27 LAB — CULTURE, BLOOD (ROUTINE X 2)
Culture: NO GROWTH
Culture: NO GROWTH
Special Requests: ADEQUATE
Special Requests: ADEQUATE

## 2022-08-27 NOTE — Progress Notes (Signed)
Subjective/Chief Complaint: Feels much better today. Passing flatus   Objective: Vital signs in last 24 hours: Temp:  [98.3 F (36.8 C)-98.6 F (37 C)] 98.3 F (36.8 C) (09/04 0526) Pulse Rate:  [66-71] 68 (09/04 0526) Resp:  [16-20] 18 (09/04 0526) BP: (126-146)/(80-98) 140/90 (09/04 0526) SpO2:  [95 %-96 %] 95 % (09/04 0526) Last BM Date :  (pta)  Intake/Output from previous day: 09/03 0701 - 09/04 0700 In: 1482.8 [P.O.:440; I.V.:650; IV Piggyback:392.8] Out: -  Intake/Output this shift: No intake/output data recorded.  General appearance: alert and cooperative Resp: clear to auscultation bilaterally Cardio: regular rate and rhythm GI: soft, moderate focal LLQ tenderness  Lab Results:  Recent Labs    08/26/22 0354  WBC 8.0  HGB 11.0*  HCT 34.9*  PLT 261   BMET Recent Labs    08/26/22 0354  NA 137  K 3.4*  CL 104  CO2 24  GLUCOSE 88  BUN 7*  CREATININE 0.74  CALCIUM 8.5*   PT/INR No results for input(s): "LABPROT", "INR" in the last 72 hours. ABG No results for input(s): "PHART", "HCO3" in the last 72 hours.  Invalid input(s): "PCO2", "PO2"  Studies/Results: CT ABDOMEN PELVIS W CONTRAST  Result Date: 08/26/2022 CLINICAL DATA:  Nausea and vomiting.  Left lower quadrant pain. EXAM: CT ABDOMEN AND PELVIS WITH CONTRAST TECHNIQUE: Multidetector CT imaging of the abdomen and pelvis was performed using the standard protocol following bolus administration of intravenous contrast. RADIATION DOSE REDUCTION: This exam was performed according to the departmental dose-optimization program which includes automated exposure control, adjustment of the mA and/or kV according to patient size and/or use of iterative reconstruction technique. CONTRAST:  OMNIPAQUE IOHEXOL 300 MG/ML  SOLN COMPARISON:  08/22/2022 FINDINGS: Lower chest: Unremarkable. Hepatobiliary: No focal liver abnormality is seen. No gallstones, gallbladder wall thickening, or biliary dilatation.  Pancreas: Unremarkable. No pancreatic ductal dilatation or surrounding inflammatory changes. Spleen: Normal in size without focal abnormality. Adrenals/Urinary Tract: No adrenal nodule or mass. Stable bilateral renal cysts measuring up to 3.4 cm in the right kidney 2.2 cm left kidney. No hydronephrosis. No evidence for hydroureter. The urinary bladder appears normal for the degree of distention. Stomach/Bowel: Surgical changes again noted in the stomach. Duodenum is normally positioned as is the ligament of Treitz. No small bowel wall thickening. No small bowel dilatation. Cecum, ascending colon and transverse colon are distended with fluid. Descending colon also mildly distended and fluid-filled. There is marked circumferential wall thickening in the proximal sigmoid colon with pericolonic edema/inflammation in a background of left colonic diverticulosis. Imaging features may reflect diverticulitis although a relatively long segment of the sigmoid colon is involved and infectious/inflammatory colitis cannot be excluded. There is edema and inflammation in the sigmoid mesocolon without evidence for extraluminal gas or abscess. Vascular/Lymphatic: There is mild atherosclerotic calcification of the abdominal aorta without aneurysm. There is no gastrohepatic or hepatoduodenal ligament lymphadenopathy. No retroperitoneal or mesenteric lymphadenopathy. No pelvic sidewall lymphadenopathy. Increased number of lymph nodes are seen in the retroperitoneal space and upper pelvic sidewall bilaterally, likely reactive. Reproductive: Uterus surgically absent.  There is no adnexal mass. Other: No intraperitoneal free fluid. Musculoskeletal: No worrisome lytic or sclerotic osseous abnormality. IMPRESSION: 1. Marked circumferential wall thickening in the proximal sigmoid colon with pericolonic edema/inflammation in a background of left colonic diverticulosis. Findings are progressive since the 08/22/2022 exam and while imaging  features may reflect diverticulitis, a relatively long segment of the sigmoid colon is involved and infectious/inflammatory colitis is also a  consideration. Neoplasm is not entirely excluded. No evidence for extraluminal gas or abscess. 2. Increasing distension and increasing stool volume in the colon proximal to the sigmoid segment. While this may be functional due to the changes in the sigmoid colon, stricture/mechanical obstruction cannot be excluded. 3. Increased number of lymph nodes in the retroperitoneal space and upper pelvic sidewall bilaterally, likely reactive. 4. Bilateral renal cysts. 5. Aortic Atherosclerosis (ICD10-I70.0). Increasing Electronically Signed   By: Misty Stanley M.D.   On: 08/26/2022 05:25   DG Abd Portable 2V  Result Date: 08/25/2022 CLINICAL DATA:  Abdominal pain.  Diverticulitis on recent CT. EXAM: PORTABLE ABDOMEN - 2 VIEW COMPARISON:  CT 08/22/2022 FINDINGS: Divided portable semi upright views of the abdomen obtained. There is no obvious free intra-abdominal air. Increasing gaseous distension of transverse colon. Air-filled loop of bowel in the left abdomen, unclear if this represents descending colon or small bowel, measures 3.8 cm. There is an adjacent air-filled loops of prominent small bowel. 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 There are multiple phleboliths in the abdomen when compared to prior CT. Soft tissue attenuation from habitus limits detailed assessment. IMPRESSION: 1. Increasing gaseous distension of transverse colon, suggesting colonic ileus. 2. Air-filled loop of bowel in the left abdomen, unclear if this represents descending colon or small bowel. 3. No obvious free air. Electronically Signed   By: Keith Rake M.D.   On: 08/25/2022 16:54    Anti-infectives: Anti-infectives (From admission, onward)    Start     Dose/Rate Route Frequency Ordered Stop   08/26/22 1400  ceFEPIme (MAXIPIME) 2 g in sodium chloride 0.9 % 100 mL  IVPB        2 g 200 mL/hr over 30 Minutes Intravenous Every 8 hours 08/26/22 0848     08/23/22 0400  cefTRIAXone (ROCEPHIN) 2 g in sodium chloride 0.9 % 100 mL IVPB  Status:  Discontinued        2 g 200 mL/hr over 30 Minutes Intravenous Every 24 hours 08/22/22 0345 08/26/22 0848   08/22/22 1400  metroNIDAZOLE (FLAGYL) IVPB 500 mg        500 mg 100 mL/hr over 60 Minutes Intravenous Every 12 hours 08/22/22 0345     08/22/22 0100  cefTRIAXone (ROCEPHIN) 2 g in sodium chloride 0.9 % 100 mL IVPB        2 g 200 mL/hr over 30 Minutes Intravenous  Once 08/22/22 0047 08/22/22 0226   08/22/22 0100  metroNIDAZOLE (FLAGYL) IVPB 500 mg        500 mg 100 mL/hr over 60 Minutes Intravenous  Once 08/22/22 0047 08/22/22 0412       Assessment/Plan: s/p * No surgery found * Continue IV cefepime Consider starting clears but want to go slow Smoldering sigmoid diverticulitis - several bouts x10 years, last bout 1 year ago, this episode began 2 months ago and she has gone through a course of cipro/ flagyl followed by augmentin outpatient with no relief - CT 8/30 showed interval worsening of sigmoid diverticulitis, no free air or abscess.  - Left sided pain improving but still constipated. WBC WNL, TMAX 98.3. will give milk of mag today. Ok for clear liquids. Continue IV cefepime. Mobilize. getting colace and miralax   ID - cefepime/flagyl VTE - lovenox FEN - IVF, CLD Foley - none Follow up - if she improves can see Dr. Dema Severin (has seen him for this before), also needs GI follow up for colonoscopy  LOS: 5 days  Chevis Pretty III 08/27/2022

## 2022-08-27 NOTE — Plan of Care (Signed)
  Problem: Education: Goal: Knowledge of General Education information will improve Description: Including pain rating scale, medication(s)/side effects and non-pharmacologic comfort measures 08/27/2022 0812 by Mariann Laster, RN Outcome: Progressing 08/26/2022 1948 by Mariann Laster, RN Outcome: Progressing   Problem: Clinical Measurements: Goal: Ability to maintain clinical measurements within normal limits will improve Outcome: Progressing   Problem: Pain Managment: Goal: General experience of comfort will improve 08/27/2022 0812 by Mariann Laster, RN Outcome: Progressing 08/26/2022 1948 by Mariann Laster, RN Outcome: Progressing   Problem: Safety: Goal: Ability to remain free from injury will improve Outcome: Progressing

## 2022-08-27 NOTE — Progress Notes (Signed)
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  Work-up in the ED revealed sigmoid diverticulitis  Pertinent data: 8/30-CT abdomen pelvis-interval worsening of sigmoid diverticulitis, no free air or abscess. 9/2-CT abdomen pelvis-circumferential wall thickening of the proximal sigmoid colon, felt to be progressive since prior CT, increasing distention and stool in the colon concerning for stricture/mechanical obstruction  Subjective / 24h Interval events: Continues to feel better.  She thinks that changing antibiotics yesterday really made a difference.  Less pain.  Still has not had a bowel movement  Assesement and Plan: Principal problem Recurrent sigmoid diverticulitis, failed outpatient treatment-patient has a history of recurrent diverticulitis, previously seen by surgery with plans for partial colectomy but this was postponed.  Repeat CT scan on 9/2 showed persistent inflammation, and antibiotics were switched to cefepime.  Seems to be improving a little bit.  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, she is status post gastric sleeve procedure for weight loss  GERD-continue PPI  Hypokalemia-continue to monitor potassium and magnesium  Scheduled Meds:  amLODipine  10 mg Oral Q supper   cloNIDine  0.1 mg Oral Q supper   enoxaparin (LOVENOX) injection  40 mg Subcutaneous Q24H   ketorolac  15 mg Intravenous Q8H   multivitamin with minerals  1 tablet Oral Daily    pantoprazole  40 mg Oral Daily   polyethylene glycol  17 g Oral BID   saccharomyces boulardii  250 mg Oral BID   senna-docusate  2 tablet Oral BID   Continuous Infusions:  sodium chloride 50 mL/hr at 08/27/22 0646   ceFEPime (MAXIPIME) IV 2 g (08/27/22 0547)   metronidazole 500 mg (08/27/22 0647)   PRN Meds:.acetaminophen, diphenhydrAMINE, fentaNYL (SUBLIMAZE) injection, hydrocortisone cream, melatonin, ondansetron (ZOFRAN) IV, oxyCODONE, prochlorperazine  Diet Orders (From admission, onward)     Start     Ordered   08/26/22 1032  Diet clear liquid Room service appropriate? Yes; Fluid consistency: Thin  Diet effective now       Question Answer Comment  Room service appropriate? Yes   Fluid consistency: Thin      08/26/22 1031            DVT prophylaxis: enoxaparin (LOVENOX) injection 40 mg Start: 08/22/22 1000   Lab Results  Component Value Date   PLT 261 08/26/2022      Code Status: Full Code  Family Communication: No family at bedside  Status is: Inpatient  Remains inpatient appropriate because: Persistent abdominal pain  Level of care: Med-Surg  Consultants:  General surgery  Objective: Vitals:   08/26/22 1417 08/26/22 1820 08/26/22 2051 08/27/22 0526  BP: (!) 146/88 (!) 142/98 126/80 (!) 140/90  Pulse: 71  66 68  Resp: 16  20 18   Temp: 98.6 F (37 C)  98.4 F (36.9 C) 98.3 F (36.8 C)  TempSrc: Oral  Oral   SpO2: 96%  96% 95%  Weight:      Height:        Intake/Output Summary (Last 24 hours) at 08/27/2022 1059 Last data filed at 08/27/2022 0700 Gross per  24 hour  Intake 1482.81 ml  Output --  Net 1482.81 ml    Wt Readings from Last 3 Encounters:  08/21/22 112.5 kg  08/07/22 114.1 kg  04/17/22 122.7 kg    Examination:  Constitutional: NAD Eyes: lids and conjunctivae normal, no scleral icterus ENMT: mmm Neck: normal, supple Respiratory: clear to auscultation bilaterally, no wheezing, no crackles. Normal respiratory effort.   Cardiovascular: Regular rate and rhythm, no murmurs / rubs / gallops. No LE edema. Abdomen: soft, no distention, mild tenderness in the left lower quadrant Skin: no rashes Neurologic: no focal deficits, equal strength   Data Reviewed: I have independently reviewed following labs and imaging studies   CBC Recent Labs  Lab 08/21/22 2306 08/22/22 0430 08/23/22 0418 08/24/22 0419 08/26/22 0354  WBC 7.9 7.3 7.6 7.2 8.0  HGB 11.9* 10.7* 11.0* 10.6* 11.0*  HCT 37.1 34.2* 34.7* 33.0* 34.9*  PLT 234 217 220 225 261  MCV 89.0 91.2 89.2 89.7 89.5  MCH 28.5 28.5 28.3 28.8 28.2  MCHC 32.1 31.3 31.7 32.1 31.5  RDW 14.3 14.3 14.2 14.2 14.2  LYMPHSABS 1.9  --  1.3  --   --   MONOABS 0.8  --  0.9  --   --   EOSABS 0.1  --  0.1  --   --   BASOSABS 0.0  --  0.0  --   --      Recent Labs  Lab 08/21/22 2306 08/22/22 0430 08/23/22 0418 08/24/22 0419 08/26/22 0354  NA 139  --  138 139 137  K 3.6  --  3.3* 3.5 3.4*  CL 103  --  103 104 104  CO2 25  --  28 26 24   GLUCOSE 92  --  101* 81 88  BUN 10  --  6* 7* 7*  CREATININE 0.80 0.79 0.80 0.83 0.74  CALCIUM 9.2  --  8.6* 8.7* 8.5*  AST 18  --  16 14*  --   ALT 16  --  14 13  --   ALKPHOS 55  --  50 48  --   BILITOT 0.5  --  0.5 0.5  --   ALBUMIN 3.3*  --  2.8* 2.8*  --   MG  --   --  1.9  --  2.1     ------------------------------------------------------------------------------------------------------------------ No results for input(s): "CHOL", "HDL", "LDLCALC", "TRIG", "CHOLHDL", "LDLDIRECT" in the last 72 hours.  Lab Results  Component Value Date   HGBA1C 6.0 (H) 02/06/2022   ------------------------------------------------------------------------------------------------------------------ No results for input(s): "TSH", "T4TOTAL", "T3FREE", "THYROIDAB" in the last 72 hours.  Invalid input(s): "FREET3"  Cardiac Enzymes No results for input(s): "CKMB", "TROPONINI", "MYOGLOBIN" in the last 168 hours.  Invalid input(s):  "CK" ------------------------------------------------------------------------------------------------------------------ No results found for: "BNP"  CBG: No results for input(s): "GLUCAP" in the last 168 hours.  Recent Results (from the past 240 hour(s))  Culture, blood (Routine X 2) w Reflex to ID Panel     Status: None   Collection Time: 08/22/22  3:58 PM   Specimen: BLOOD  Result Value Ref Range Status   Specimen Description   Final    BLOOD BLOOD RIGHT ARM Performed at [facility], [address], [place], [place]    Special Requests   Final    BOTTLES DRAWN AEROBIC ONLY Blood Culture adequate volume Performed at [facility], [address], [place], [place]    Culture   Final    NO  GROWTH 5 DAYS Performed at [facility], [address], [place], [place] [address]    Report Status 08/27/2022 FINAL  Final  Culture, blood (Routine X 2) w Reflex to ID Panel     Status: None   Collection Time: 08/22/22  3:58 PM   Specimen: BLOOD  Result Value Ref Range Status   Specimen Description   Final    BLOOD BLOOD RIGHT HAND Performed at [facility], [address] [place], [place] [address]    Special Requests   Final    BOTTLES DRAWN AEROBIC ONLY Blood Culture adequate volume Performed at [facility], [address], [place], [place] [address]    Culture   Final    NO GROWTH 5 DAYS Performed at [facility], [address] [place], [place] [address]    Report Status 08/27/2022 FINAL  Final     Radiology Studies: No results found.   Pamella Pert, MD, PhD [facility]  Between 7 am - 7 pm I am available, please contact me via Amion (for emergencies) or Securechat (non urgent messages)  Between 7 pm - 7 am I am not available, please contact night coverage MD/APP via Amion

## 2022-08-27 NOTE — Care Management Important Message (Signed)
Important Message  Patient Details IM Letter given to the Patient. Name: Kristin Gibson MRN: [id number] Date of Birth: 30-Aug-1956   Medicare Important Message Given:  Yes     Caren Macadam 08/27/2022, 11:53 AM

## 2022-08-28 DIAGNOSIS — K5732 Diverticulitis of large intestine without perforation or abscess without bleeding: Secondary | ICD-10-CM | POA: Diagnosis not present

## 2022-08-28 LAB — BASIC METABOLIC PANEL
Anion gap: 9 (ref 5–15)
BUN: 7 mg/dL — ABNORMAL LOW (ref 8–23)
CO2: 23 mmol/L (ref 22–32)
Calcium: 8.9 mg/dL (ref 8.9–10.3)
Chloride: 107 mmol/L (ref 98–111)
Creatinine, Ser: 0.61 mg/dL (ref 0.44–1.00)
GFR, Estimated: >60 mL/min (ref >60)
Glucose, Bld: 86 mg/dL (ref 70–99)
Potassium: 3.6 mmol/L (ref 3.5–5.1)
Sodium: 139 mmol/L (ref 135–145)

## 2022-08-28 LAB — CBC
HCT: 33.2 % — ABNORMAL LOW (ref 36.0–46.0)
Hemoglobin: 10.8 g/dL — ABNORMAL LOW (ref 12.0–15.0)
MCH: 28.8 pg (ref 26.0–34.0)
MCHC: 32.5 g/dL (ref 30.0–36.0)
MCV: 88.5 fL (ref 80.0–100.0)
Platelets: 287 10*3/uL (ref 150–400)
RBC: 3.75 MIL/uL — ABNORMAL LOW (ref 3.87–5.11)
RDW: 14.1 % (ref 11.5–15.5)
WBC: 6.5 10*3/uL (ref 4.0–10.5)
nRBC: 0 % (ref 0.0–0.2)

## 2022-08-28 LAB — MAGNESIUM: Magnesium: 2 mg/dL (ref 1.7–2.4)

## 2022-08-28 NOTE — Progress Notes (Signed)
PROGRESS NOTE  Kristin Gibson IRW:[id number] DOB: [birth year] DOA: 08/21/2022 PCP: Woodroe Chen, MD   LOS: 6 days   Brief Narrative / Interim history: 66 year old female with history of gastric sleeve surgery for weight loss February 2093, recurrent diverticulitis, recently completed 2 rounds of antibiotics comes to the hospital with recurrent abdominal pain, left lower quadrant, as well as subjective fever and chills at home.  Work-up in the ED revealed sigmoid diverticulitis  Pertinent data: 8/30-CT abdomen pelvis-interval worsening of sigmoid diverticulitis, no free air or abscess. 9/2-CT abdomen pelvis-circumferential wall thickening of the proximal sigmoid colon, felt to be progressive since prior CT, increasing distention and stool in the colon concerning for stricture/mechanical obstruction  Subjective / 24h Interval events: She is quite concerned that she has not had a bowel movement yet.  No nausea or vomiting this morning.  Assesement and Plan: Principal problem Recurrent sigmoid diverticulitis, failed outpatient treatment-patient has a history of recurrent diverticulitis, previously seen by surgery with plans for partial colectomy but this was postponed.  Repeat CT scan on 9/2 showed persistent inflammation, and antibiotics were switched to cefepime.  Clinically seems to be improving but still constipated.  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, she is status post gastric sleeve procedure for weight loss  GERD-continue PPI  Hypokalemia-continue to monitor potassium and magnesium  Scheduled Meds:  amLODipine  10 mg Oral Q supper   cloNIDine  0.1 mg Oral Q supper   enoxaparin (LOVENOX) injection  40 mg Subcutaneous Q24H   multivitamin with minerals  1 tablet Oral Daily   pantoprazole  40 mg Oral Daily   polyethylene glycol  17 g Oral BID    saccharomyces boulardii  250 mg Oral BID   senna-docusate  2 tablet Oral BID   Continuous Infusions:  sodium chloride 50 mL/hr at 08/28/22 0231   ceFEPime (MAXIPIME) IV 2 g (08/28/22 0727)   metronidazole 500 mg (08/28/22 0609)   PRN Meds:.acetaminophen, diphenhydrAMINE, fentaNYL (SUBLIMAZE) injection, hydrocortisone cream, melatonin, ondansetron (ZOFRAN) IV, oxyCODONE, prochlorperazine  Diet Orders (From admission, onward)     Start     Ordered   08/28/22 1113  Diet full liquid Room service appropriate? Yes; Fluid consistency: Thin  Diet effective now       Question Answer Comment  Room service appropriate? Yes   Fluid consistency: Thin      08/28/22 1112            DVT prophylaxis: enoxaparin (LOVENOX) injection 40 mg Start: 08/22/22 1000   Lab Results  Component Value Date   PLT 287 08/28/2022      Code Status: Full Code  Family Communication: No family at bedside  Status is: Inpatient  Remains inpatient appropriate because: Persistent abdominal pain  Level of care: Med-Surg  Consultants:  General surgery  Objective: Vitals:   08/27/22 2057 08/28/22 0200 08/28/22 0522 08/28/22 0952  BP: 113/63 129/77 122/82 (!) 147/96  Pulse: 60 74 64 83  Resp: 18 16 17 16   Temp: 98.1 F (36.7 C) 98.7 F (37.1 C) 98.1 F (36.7 C) 99 F (37.2 C)  TempSrc:  Oral  Oral  SpO2: 96% 98% 96% 98%  Weight:      Height:        Intake/Output Summary (Last 24 hours) at 08/28/2022 1123 Last data filed at 08/28/2022 0600 Gross per 24 hour  Intake 1999.09 ml  Output --  Net 1999.09 ml  Wt Readings from Last 3 Encounters:  08/21/22 112.5 kg  08/07/22 114.1 kg  04/17/22 122.7 kg    Examination:  Constitutional: NAD Eyes: lids and conjunctivae normal, no scleral icterus ENMT: mmm Neck: normal, supple Respiratory: clear to auscultation bilaterally, no wheezing, no crackles. Normal respiratory effort.  Cardiovascular: Regular rate and rhythm, no murmurs / rubs /  gallops. No LE edema. Abdomen: soft, no distention, no tenderness. Bowel sounds positive.  Skin: no rashes Neurologic: no focal deficits, equal strength   Data Reviewed: I have independently reviewed following labs and imaging studies   CBC Recent Labs  Lab 08/21/22 2306 08/22/22 0430 08/23/22 0418 08/24/22 0419 08/26/22 0354 08/28/22 0332  WBC 7.9 7.3 7.6 7.2 8.0 6.5  HGB 11.9* 10.7* 11.0* 10.6* 11.0* 10.8*  HCT 37.1 34.2* 34.7* 33.0* 34.9* 33.2*  PLT 234 217 220 225 261 287  MCV 89.0 91.2 89.2 89.7 89.5 88.5  MCH 28.5 28.5 28.3 28.8 28.2 28.8  MCHC 32.1 31.3 31.7 32.1 31.5 32.5  RDW 14.3 14.3 14.2 14.2 14.2 14.1  LYMPHSABS 1.9  --  1.3  --   --   --   MONOABS 0.8  --  0.9  --   --   --   EOSABS 0.1  --  0.1  --   --   --   BASOSABS 0.0  --  0.0  --   --   --      Recent Labs  Lab 08/21/22 2306 08/22/22 0430 08/23/22 0418 08/24/22 0419 08/26/22 0354 08/28/22 0332  NA 139  --  138 139 137 139  K 3.6  --  3.3* 3.5 3.4* 3.6  CL 103  --  103 104 104 107  CO2 25  --  28 26 24 23   GLUCOSE 92  --  101* 81 88 86  BUN 10  --  6* 7* 7* 7*  CREATININE 0.80 0.79 0.80 0.83 0.74 0.61  CALCIUM 9.2  --  8.6* 8.7* 8.5* 8.9  AST 18  --  16 14*  --   --   ALT 16  --  14 13  --   --   ALKPHOS 55  --  50 48  --   --   BILITOT 0.5  --  0.5 0.5  --   --   ALBUMIN 3.3*  --  2.8* 2.8*  --   --   MG  --   --  1.9  --  2.1 2.0     ------------------------------------------------------------------------------------------------------------------ No results for input(s): "CHOL", "HDL", "LDLCALC", "TRIG", "CHOLHDL", "LDLDIRECT" in the last 72 hours.  Lab Results  Component Value Date   HGBA1C 6.0 (H) 02/06/2022   ------------------------------------------------------------------------------------------------------------------ No results for input(s): "TSH", "T4TOTAL", "T3FREE", "THYROIDAB" in the last 72 hours.  Invalid input(s): "FREET3"  Cardiac Enzymes No results for  input(s): "CKMB", "TROPONINI", "MYOGLOBIN" in the last 168 hours.  Invalid input(s): "CK" ------------------------------------------------------------------------------------------------------------------ No results found for: "BNP"  CBG: No results for input(s): "GLUCAP" in the last 168 hours.  Recent Results (from the past 240 hour(s))  Culture, blood (Routine X 2) w Reflex to ID Panel     Status: None   Collection Time: 08/22/22  3:58 PM   Specimen: BLOOD  Result Value Ref Range Status   Specimen Description   Final    BLOOD BLOOD RIGHT ARM Performed at [facility], [address] [place], [place]    Special Requests   Final    BOTTLES  DRAWN AEROBIC ONLY Blood Culture adequate volume Performed at [facility], [address] [place], [place] [address]    Culture   Final    NO GROWTH 5 DAYS Performed at [facility], [address], [place], [place] [address]    Report Status 08/27/2022 FINAL  Final  Culture, blood (Routine X 2) w Reflex to ID Panel     Status: None   Collection Time: 08/22/22  3:58 PM   Specimen: BLOOD  Result Value Ref Range Status   Specimen Description   Final    BLOOD BLOOD RIGHT HAND Performed at [facility], [address] [place], [place] [address]    Special Requests   Final    BOTTLES DRAWN AEROBIC ONLY Blood Culture adequate volume Performed at [facility], [address] [place], [place] [address]    Culture   Final    NO GROWTH 5 DAYS Performed at [facility], [address], [place], [place] [address]    Report Status 08/27/2022 FINAL  Final     Radiology Studies: No results found.   Pamella Pert, MD, PhD [facility]  Between 7 am - 7 pm I am available, please contact me via Amion (for emergencies) or Securechat (non urgent messages)  Between 7 pm - 7 am I am not available, please contact night coverage MD/APP via  Amion

## 2022-08-28 NOTE — Progress Notes (Signed)
    Assessment & Plan: HD#6 - smoldering sigmoid diverticulitis - continue IV cefepime - tolerating clear liquids, advance to full liquid diet today - WBC 6.5, afebrile   ID - cefepime/flagyl VTE - lovenox FEN - IVF, FLD Foley - none Follow up - if continued improvement, will arrange follow up with Dr. Angelena Form (has seen him for this before), also needs GI follow up for colonoscopy.  Will follow.        Darnell Level, MD [facility] practice Office: [phone]        Chief Complaint: diverticulitis  Subjective: Patient in bed, feels better, passing flatus, wants to advance diet.  Objective: Vital signs in last 24 hours: Temp:  [98.1 F (36.7 C)-99.6 F (37.6 C)] 99 F (37.2 C) (09/05 0952) Pulse Rate:  [60-83] 83 (09/05 0952) Resp:  [16-18] 16 (09/05 0952) BP: (113-152)/(63-96) 147/96 (09/05 0952) SpO2:  [96 %-98 %] 98 % (09/05 0952) Last BM Date : 08/21/22  Intake/Output from previous day: 09/04 0701 - 09/05 0700 In: 2149.1 [P.O.:520; I.V.:1118.6; IV Piggyback:510.5] Out: -  Intake/Output this shift: No intake/output data recorded.  Physical Exam: HEENT - sclerae clear, mucous membranes moist Neck - soft Abdomen - soft, obese; mild tenderness LLQ, no guarding, no mass Ext - no edema, non-tender Neuro - alert & oriented, no focal deficits  Lab Results:  Recent Labs    08/26/22 0354 08/28/22 0332  WBC 8.0 6.5  HGB 11.0* 10.8*  HCT 34.9* 33.2*  PLT 261 287   BMET Recent Labs    08/26/22 0354 08/28/22 0332  NA 137 139  K 3.4* 3.6  CL 104 107  CO2 24 23  GLUCOSE 88 86  BUN 7* 7*  CREATININE 0.74 0.61  CALCIUM 8.5* 8.9   PT/INR No results for input(s): "LABPROT", "INR" in the last 72 hours. Comprehensive Metabolic Panel:    Component Value Date/Time   NA 139 08/28/2022 0332   NA 137 08/26/2022 0354   K 3.6 08/28/2022 0332   K 3.4 (L) 08/26/2022 0354   CL 107 08/28/2022 0332   CL 104 08/26/2022 0354   CO2  23 08/28/2022 0332   CO2 24 08/26/2022 0354   BUN 7 (L) 08/28/2022 0332   BUN 7 (L) 08/26/2022 0354   CREATININE 0.61 08/28/2022 0332   CREATININE 0.74 08/26/2022 0354   GLUCOSE 86 08/28/2022 0332   GLUCOSE 88 08/26/2022 0354   CALCIUM 8.9 08/28/2022 0332   CALCIUM 8.5 (L) 08/26/2022 0354   AST 14 (L) 08/24/2022 0419   AST 16 08/23/2022 0418   ALT 13 08/24/2022 0419   ALT 14 08/23/2022 0418   ALKPHOS 48 08/24/2022 0419   ALKPHOS 50 08/23/2022 0418   BILITOT 0.5 08/24/2022 0419   BILITOT 0.5 08/23/2022 0418   PROT 7.0 08/24/2022 0419   PROT 7.0 08/23/2022 0418   ALBUMIN 2.8 (L) 08/24/2022 0419   ALBUMIN 2.8 (L) 08/23/2022 0418    Studies/Results: No results found.    Darnell Level 08/28/2022   Patient ID: Kristin Gibson, female   DOB: 21-Aug-1956, 66 y.o.   MRN: [id number]

## 2022-08-28 NOTE — Progress Notes (Signed)
Mobility Specialist - Progress Note   08/28/22 1615  Mobility  HOB Elevated/Bed Position Self regulated  Activity Ambulated independently in hallway  Range of Motion/Exercises Active  Level of Assistance Independent  Assistive Device None  Distance Ambulated (ft) 350 ft  Activity Response Tolerated well  Transport method Ambulatory  $Mobility charge 1 Mobility   Pt received in bed and agreeable to mobility.  Pt to bed after session with all needs met.    [facility]

## 2022-08-28 NOTE — Plan of Care (Signed)
  Problem: Education: Goal: Knowledge of General Education information will improve Description: Including pain rating scale, medication(s)/side effects and non-pharmacologic comfort measures Outcome: Progressing   Problem: Clinical Measurements: Goal: Will remain free from infection Outcome: Progressing Goal: Diagnostic test results will improve Outcome: Progressing   Problem: Pain Managment: Goal: General experience of comfort will improve Outcome: Progressing   

## 2022-08-29 DIAGNOSIS — K5732 Diverticulitis of large intestine without perforation or abscess without bleeding: Secondary | ICD-10-CM | POA: Diagnosis not present

## 2022-08-29 DIAGNOSIS — D638 Anemia in other chronic diseases classified elsewhere: Secondary | ICD-10-CM

## 2022-08-29 LAB — CBC
HCT: 36.1 % (ref 36.0–46.0)
Hemoglobin: 11.5 g/dL — ABNORMAL LOW (ref 12.0–15.0)
MCH: 28.2 pg (ref 26.0–34.0)
MCHC: 31.9 g/dL (ref 30.0–36.0)
MCV: 88.5 fL (ref 80.0–100.0)
Platelets: 313 10*3/uL (ref 150–400)
RBC: 4.08 MIL/uL (ref 3.87–5.11)
RDW: 14.2 % (ref 11.5–15.5)
WBC: 6.3 10*3/uL (ref 4.0–10.5)
nRBC: 0 % (ref 0.0–0.2)

## 2022-08-29 LAB — BASIC METABOLIC PANEL
Anion gap: 9 (ref 5–15)
BUN: 6 mg/dL — ABNORMAL LOW (ref 8–23)
CO2: 24 mmol/L (ref 22–32)
Calcium: 8.9 mg/dL (ref 8.9–10.3)
Chloride: 106 mmol/L (ref 98–111)
Creatinine, Ser: 0.66 mg/dL (ref 0.44–1.00)
GFR, Estimated: >60 mL/min (ref >60)
Glucose, Bld: 92 mg/dL (ref 70–99)
Potassium: 3.7 mmol/L (ref 3.5–5.1)
Sodium: 139 mmol/L (ref 135–145)

## 2022-08-29 MED ORDER — BISACODYL 10 MG RE SUPP
10.0000 mg | Freq: Once | RECTAL | Status: AC
Start: 2022-08-29 — End: 2022-08-29
  Administered 2022-08-29: 10 mg via RECTAL
  Filled 2022-08-29: qty 1

## 2022-08-29 MED ORDER — MAGNESIUM HYDROXIDE 400 MG/5ML PO SUSP
30.0000 mL | Freq: Once | ORAL | Status: AC
Start: 2022-08-29 — End: 2022-08-29
  Administered 2022-08-29: 30 mL via ORAL
  Filled 2022-08-29: qty 30

## 2022-08-29 MED ORDER — BOOST / RESOURCE BREEZE PO LIQD CUSTOM
1.0000 | Freq: Three times a day (TID) | ORAL | Status: DC
  Administered 2022-08-29 – 2022-08-30 (×5): 1 via ORAL

## 2022-08-29 NOTE — Plan of Care (Signed)

## 2022-08-29 NOTE — Progress Notes (Signed)
PROGRESS NOTE    Kristin Gibson  SWN:[id number] DOB: 1956-12-20 DOA: 08/21/2022 PCP: Woodroe Chen, MD   Brief Narrative:  66 year old female with history of gastric sleeve surgery for weight loss February 2093, recurrent diverticulitis, recently completed 2 rounds of antibiotics presented with worsening abdominal pain with fever and chills. She was admitted with sigmoid diverticulitis and started on iv antibiotics. General surgery was consulted.  Assessment & Plan:   Recurrent sigmoid diverticulitis with failed outpatient treatment -Patient has history of recurrent diverticulitis and failed recent outpatient antibiotic point -Repeat CT scan on 08/25/2022 showed persistent inflammation; antibiotics were switched to cefepime and Flagyl. -General surgery following.  Diet advancement as per surgery.  Also on IV fluids.  Continue pain management.  Essential hypertension -Continue amlodipine, continue.  Blood pressure intermittently elevated  Obesity, class II -Outpatient follow-up  GERD -Continue PPI  Hypokalemia -Improved  Anemia of chronic disease -Possibly from chronic illnesses.  Hemoglobin stable  DVT prophylaxis: Lovenox Code Status: Full Family Communication: None at bedside Disposition Plan: Status is: Inpatient Remains inpatient appropriate because: Of severity of illness    Consultants: General surgery  Procedures: None  Antimicrobials:  Anti-infectives (From admission, onward)    Start     Dose/Rate Route Frequency Ordered Stop   08/26/22 1400  ceFEPIme (MAXIPIME) 2 g in sodium chloride 0.9 % 100 mL IVPB        2 g 200 mL/hr over 30 Minutes Intravenous Every 8 hours 08/26/22 0848     08/23/22 0400  cefTRIAXone (ROCEPHIN) 2 g in sodium chloride 0.9 % 100 mL IVPB  Status:  Discontinued        2 g 200 mL/hr over 30 Minutes Intravenous Every 24 hours 08/22/22 0345 08/26/22 0848   08/22/22 1400  metroNIDAZOLE (FLAGYL) IVPB 500 mg        500 mg 100 mL/hr  over 60 Minutes Intravenous Every 12 hours 08/22/22 0345     08/22/22 0100  cefTRIAXone (ROCEPHIN) 2 g in sodium chloride 0.9 % 100 mL IVPB        2 g 200 mL/hr over 30 Minutes Intravenous  Once 08/22/22 0047 08/22/22 0226   08/22/22 0100  metroNIDAZOLE (FLAGYL) IVPB 500 mg        500 mg 100 mL/hr over 60 Minutes Intravenous  Once 08/22/22 0047 08/22/22 0412        Subjective: Patient seen and examined at bedside.  Still complains of intermittent lower abdominal pain with some bloating.  Denies fever, vomiting, chest pain or shortness of breath.  Still intermittently nauseous.  Objective: Vitals:   08/28/22 1144 08/28/22 1726 08/28/22 2141 08/29/22 0444  BP: 138/85 (!) 130/96 (!) 132/93 (!) 132/92  Pulse: 64 69 62 72  Resp: 17  16   Temp: 98.5 F (36.9 C)  98.8 F (37.1 C) 98.8 F (37.1 C)  TempSrc: Oral  Oral Oral  SpO2: 97%  97% 96%  Weight:      Height:        Intake/Output Summary (Last 24 hours) at 08/29/2022 1258 Last data filed at 08/29/2022 0300 Gross per 24 hour  Intake 1637.02 ml  Output --  Net 1637.02 ml   Filed Weights   08/21/22 2220  Weight: 112.5 kg    Examination:  General exam: Appears calm and comfortable.  Currently on room air Respiratory system: Bilateral decreased breath sounds at bases Cardiovascular system: S1 & S2 heard, Rate controlled Gastrointestinal system: Abdomen is distended, soft and mildly tender in the lower  quadrant.  Bowel sounds sluggish   Data Reviewed: I have personally reviewed following labs and imaging studies  CBC: Recent Labs  Lab 08/23/22 0418 08/24/22 0419 08/26/22 0354 08/28/22 0332 08/29/22 0657  WBC 7.6 7.2 8.0 6.5 6.3  NEUTROABS 5.2  --   --   --   --   HGB 11.0* 10.6* 11.0* 10.8* 11.5*  HCT 34.7* 33.0* 34.9* 33.2* 36.1  MCV 89.2 89.7 89.5 88.5 88.5  PLT 220 225 261 287 313   Basic Metabolic Panel: Recent Labs  Lab 08/23/22 0418 08/24/22 0419 08/26/22 0354 08/28/22 0332 08/29/22 0657  NA 138  139 137 139 139  K 3.3* 3.5 3.4* 3.6 3.7  CL 103 104 104 107 106  CO2 28 26 24 23 24   GLUCOSE 101* 81 88 86 92  BUN 6* 7* 7* 7* 6*  CREATININE 0.80 0.83 0.74 0.61 0.66  CALCIUM 8.6* 8.7* 8.5* 8.9 8.9  MG 1.9  --  2.1 2.0  --   PHOS 3.6  --  3.9  --   --    GFR: Estimated Creatinine Clearance: 92.5 mL/min (by C-G formula based on SCr of 0.66 mg/dL). Liver Function Tests: Recent Labs  Lab 08/23/22 0418 08/24/22 0419  AST 16 14*  ALT 14 13  ALKPHOS 50 48  BILITOT 0.5 0.5  PROT 7.0 7.0  ALBUMIN 2.8* 2.8*   No results for input(s): "LIPASE", "AMYLASE" in the last 168 hours. No results for input(s): "AMMONIA" in the last 168 hours. Coagulation Profile: No results for input(s): "INR", "PROTIME" in the last 168 hours. Cardiac Enzymes: No results for input(s): "CKTOTAL", "CKMB", "CKMBINDEX", "TROPONINI" in the last 168 hours. BNP (last 3 results) No results for input(s): "PROBNP" in the last 8760 hours. HbA1C: No results for input(s): "HGBA1C" in the last 72 hours. CBG: No results for input(s): "GLUCAP" in the last 168 hours. Lipid Profile: No results for input(s): "CHOL", "HDL", "LDLCALC", "TRIG", "CHOLHDL", "LDLDIRECT" in the last 72 hours. Thyroid Function Tests: No results for input(s): "TSH", "T4TOTAL", "FREET4", "T3FREE", "THYROIDAB" in the last 72 hours. Anemia Panel: No results for input(s): "VITAMINB12", "FOLATE", "FERRITIN", "TIBC", "IRON", "RETICCTPCT" in the last 72 hours. Sepsis Labs: No results for input(s): "PROCALCITON", "LATICACIDVEN" in the last 168 hours.  Recent Results (from the past 240 hour(s))  Culture, blood (Routine X 2) w Reflex to ID Panel     Status: None   Collection Time: 08/22/22  3:58 PM   Specimen: BLOOD  Result Value Ref Range Status   Specimen Description   Final    BLOOD BLOOD RIGHT ARM Performed at [facility], [address], [place], [place]    Special Requests   Final    BOTTLES DRAWN AEROBIC ONLY  Blood Culture adequate volume Performed at [facility], [address] [employer] [place], [place]    Culture   Final    NO GROWTH 5 DAYS Performed at [facility], [address], [place], [place]    Report Status 08/27/2022 FINAL  Final  Culture, blood (Routine X 2) w Reflex to ID Panel     Status: None   Collection Time: 08/22/22  3:58 PM   Specimen: BLOOD  Result Value Ref Range Status   Specimen Description   Final    BLOOD BLOOD RIGHT HAND Performed at [facility], [address], [place], [place]    Special Requests   Final    BOTTLES  DRAWN AEROBIC ONLY Blood Culture adequate volume Performed at [facility], [address], [place], [place] [address]    Culture   Final    NO GROWTH 5 DAYS Performed at [facility], [address] [place], [place] [address]    Report Status 08/27/2022 FINAL  Final         Radiology Studies: No results found.      Scheduled Meds:  amLODipine  10 mg Oral Q supper   cloNIDine  0.1 mg Oral Q supper   enoxaparin (LOVENOX) injection  40 mg Subcutaneous Q24H   feeding supplement  1 Container Oral TID BM   multivitamin with minerals  1 tablet Oral Daily   pantoprazole  40 mg Oral Daily   polyethylene glycol  17 g Oral BID   saccharomyces boulardii  250 mg Oral BID   senna-docusate  2 tablet Oral BID   Continuous Infusions:  sodium chloride 50 mL/hr at 08/29/22 0447   ceFEPime (MAXIPIME) IV 2 g (08/29/22 0625)   metronidazole 500 mg (08/29/22 0452)          Glade Lloyd, MD [facility] 08/29/2022, 12:58 PM

## 2022-08-29 NOTE — Progress Notes (Addendum)
[facility] Progress Note     Subjective: CC-  Overall feels better since admission but continues to have some LLQ pain. Tolerating full liquids but she did have a little nausea this morning, no emesis. Was passing flatus yesterday, none today. Still no BM. WBC 6.3, VSS  Objective: Vital signs in last 24 hours: Temp:  [98.5 F (36.9 C)-98.8 F (37.1 C)] 98.8 F (37.1 C) (09/06 0444) Pulse Rate:  [62-72] 72 (09/06 0444) Resp:  [16-17] 16 (09/05 2141) BP: (130-138)/(85-96) 132/92 (09/06 0444) SpO2:  [96 %-97 %] 96 % (09/06 0444) Last BM Date : 08/21/22  Intake/Output from previous day: 09/05 0701 - 09/06 0700 In: 1637 [P.O.:480; I.V.:857; IV Piggyback:300] Out: -  Intake/Output this shift: No intake/output data recorded.  PE: Gen:  Alert, NAD, pleasant Abd: soft, ND, mild LLQ TTP without peritonitis  Lab Results:  Recent Labs    08/28/22 0332 08/29/22 0657  WBC 6.5 6.3  HGB 10.8* 11.5*  HCT 33.2* 36.1  PLT 287 313   BMET Recent Labs    08/28/22 0332 08/29/22 0657  NA 139 139  K 3.6 3.7  CL 107 106  CO2 23 24  GLUCOSE 86 92  BUN 7* 6*  CREATININE 0.61 0.66  CALCIUM 8.9 8.9   PT/INR No results for input(s): "LABPROT", "INR" in the last 72 hours. CMP     Component Value Date/Time   NA 139 08/29/2022 0657   K 3.7 08/29/2022 0657   CL 106 08/29/2022 0657   CO2 24 08/29/2022 0657   GLUCOSE 92 08/29/2022 0657   BUN 6 (L) 08/29/2022 0657   CREATININE 0.66 08/29/2022 0657   CALCIUM 8.9 08/29/2022 0657   PROT 7.0 08/24/2022 0419   ALBUMIN 2.8 (L) 08/24/2022 0419   AST 14 (L) 08/24/2022 0419   ALT 13 08/24/2022 0419   ALKPHOS 48 08/24/2022 0419   BILITOT 0.5 08/24/2022 0419   GFRNONAA >60 08/29/2022 0657   GFRAA >60 06/28/2020 0548   Lipase     Component Value Date/Time   LIPASE 26 08/21/2022 2306       Studies/Results: No results found.  Anti-infectives: Anti-infectives (From admission, onward)    Start     Dose/Rate Route  Frequency Ordered Stop   08/26/22 1400  ceFEPIme (MAXIPIME) 2 g in sodium chloride 0.9 % 100 mL IVPB        2 g 200 mL/hr over 30 Minutes Intravenous Every 8 hours 08/26/22 0848     08/23/22 0400  cefTRIAXone (ROCEPHIN) 2 g in sodium chloride 0.9 % 100 mL IVPB  Status:  Discontinued        2 g 200 mL/hr over 30 Minutes Intravenous Every 24 hours 08/22/22 0345 08/26/22 0848   08/22/22 1400  metroNIDAZOLE (FLAGYL) IVPB 500 mg        500 mg 100 mL/hr over 60 Minutes Intravenous Every 12 hours 08/22/22 0345     08/22/22 0100  cefTRIAXone (ROCEPHIN) 2 g in sodium chloride 0.9 % 100 mL IVPB        2 g 200 mL/hr over 30 Minutes Intravenous  Once 08/22/22 0047 08/22/22 0226   08/22/22 0100  metroNIDAZOLE (FLAGYL) IVPB 500 mg        500 mg 100 mL/hr over 60 Minutes Intravenous  Once 08/22/22 0047 08/22/22 0412        Assessment/Plan HD#7 - smoldering sigmoid diverticulitis - today is day #4 of the new antibiotics, cefepime/flagyl - Overall better since admission but symptoms still  not resolved. WBC 6.3, afebrile, mild LLQ tenderness on exam. She still has not had a bowel movement, I'm worried she could have developed a stricture rather than just constipation. Continue IV antibiotics and full liquids today. Will discuss further with MD, but she may just need surgery. We will continue to follow closely.   ID - cefepime/flagyl 9/3>> VTE - lovenox FEN - IVF, FLD Foley - none Follow up - if continued improvement, will arrange follow up with Dr. 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    LOS: 7 days    Franne Forts, [facility] 08/29/2022, 10:17 AM Please see Amion for pager number during day hours 7:00am-4:30pm

## 2022-08-30 DIAGNOSIS — K5732 Diverticulitis of large intestine without perforation or abscess without bleeding: Secondary | ICD-10-CM | POA: Diagnosis not present

## 2022-08-30 DIAGNOSIS — D638 Anemia in other chronic diseases classified elsewhere: Secondary | ICD-10-CM

## 2022-08-30 MED ORDER — CHLORHEXIDINE GLUCONATE CLOTH 2 % EX PADS
6.0000 | MEDICATED_PAD | Freq: Once | CUTANEOUS | Status: AC
  Administered 2022-08-30: 6 via TOPICAL

## 2022-08-30 MED ORDER — SODIUM CHLORIDE 0.9 % IV SOLN: 2.0000 g | INTRAVENOUS | Status: DC

## 2022-08-30 MED ORDER — ENOXAPARIN SODIUM 40 MG/0.4ML IJ SOSY: 40.0000 mg | PREFILLED_SYRINGE | INTRAMUSCULAR | Status: DC

## 2022-08-30 MED ORDER — CHLORHEXIDINE GLUCONATE CLOTH 2 % EX PADS
6.0000 | MEDICATED_PAD | Freq: Once | CUTANEOUS | Status: AC
Start: 2022-08-30 — End: 2022-08-30
  Administered 2022-08-30: 6 via TOPICAL

## 2022-08-30 NOTE — Progress Notes (Signed)
Mobility Specialist Cancellation/Refusal Note:   Reason for Cancellation/Refusal: Pt declined mobility at this time. Pt eating breakfast at this time. Will check back as schedule permits.       Maya Ryle Buscemi Mobility Specialist  

## 2022-08-30 NOTE — Progress Notes (Signed)
PROGRESS NOTE    Kristin Gibson  YBW:[id number] DOB: 11/24/1956 DOA: 08/21/2022 PCP: Woodroe Chen, MD   Brief Narrative:  66 year old female with history of gastric sleeve surgery for weight loss February 2093, recurrent diverticulitis, recently completed 2 rounds of antibiotics presented with worsening abdominal pain with fever and chills. She was admitted with sigmoid diverticulitis and started on iv antibiotics. General surgery was consulted.  Assessment & Plan:   Recurrent sigmoid diverticulitis with failed outpatient treatment -Patient has history of recurrent diverticulitis and failed recent outpatient antibiotic point -Repeat CT scan on 08/25/2022 showed persistent inflammation; antibiotics were switched to cefepime and Flagyl. -General surgery following. Continue IV fluids.  Continue pain management.  Essential hypertension -Continue amlodipine, continue.  Blood pressure intermittently elevated  Obesity, class II -Outpatient follow-up  GERD -Continue PPI  Hypokalemia -Improved  Anemia of chronic disease -Possibly from chronic illnesses.  Hemoglobin stable  DVT prophylaxis: Lovenox Code Status: Full Family Communication: None at bedside Disposition Plan: Status is: Inpatient Remains inpatient appropriate because: Of severity of illness    Consultants: General surgery  Procedures: None  Antimicrobials:  Anti-infectives (From admission, onward)    Start     Dose/Rate Route Frequency Ordered Stop   08/26/22 1400  ceFEPIme (MAXIPIME) 2 g in sodium chloride 0.9 % 100 mL IVPB        2 g 200 mL/hr over 30 Minutes Intravenous Every 8 hours 08/26/22 0848     08/23/22 0400  cefTRIAXone (ROCEPHIN) 2 g in sodium chloride 0.9 % 100 mL IVPB  Status:  Discontinued        2 g 200 mL/hr over 30 Minutes Intravenous Every 24 hours 08/22/22 0345 08/26/22 0848   08/22/22 1400  metroNIDAZOLE (FLAGYL) IVPB 500 mg        500 mg 100 mL/hr over 60 Minutes Intravenous Every  12 hours 08/22/22 0345     08/22/22 0100  cefTRIAXone (ROCEPHIN) 2 g in sodium chloride 0.9 % 100 mL IVPB        2 g 200 mL/hr over 30 Minutes Intravenous  Once 08/22/22 0047 08/22/22 0226   08/22/22 0100  metroNIDAZOLE (FLAGYL) IVPB 500 mg        500 mg 100 mL/hr over 60 Minutes Intravenous  Once 08/22/22 0047 08/22/22 0412        Subjective: Patient seen and examined at bedside.  Still complains of intermittent nausea with lower abdominal pain.  Had vomiting yesterday.  Feels slightly better this morning after having a small bowel movement.  No fever, worsening shortness of breath reported. Objective: Vitals:   08/28/22 2141 08/29/22 0444 08/29/22 1301 08/29/22 2151  BP: (!) 132/93 (!) 132/92 (!) 144/88 134/89  Pulse: 62 72 70 74  Resp: 16  16 17   Temp: 98.8 F (37.1 C) 98.8 F (37.1 C) 99.1 F (37.3 C) 98.3 F (36.8 C)  TempSrc: Oral Oral  Oral  SpO2: 97% 96% 96% 95%  Weight:      Height:        Intake/Output Summary (Last 24 hours) at 08/30/2022 0754 Last data filed at 08/30/2022 0700 Gross per 24 hour  Intake 2518.91 ml  Output --  Net 2518.91 ml    Filed Weights   08/21/22 2220  Weight: 112.5 kg    Examination:  General exam: On room air.  No distress.   Respiratory system: Decreased breath sounds at bases bilaterally, no wheezing Cardiovascular system: Controlled currently; S1 and S2 heard gastrointestinal system: Abdomen is still distended, soft  and mildly tender in the lower quadrant.  Sluggish bowel sounds  Data Reviewed: I have personally reviewed following labs and imaging studies  CBC: Recent Labs  Lab 08/24/22 0419 08/26/22 0354 08/28/22 0332 08/29/22 0657  WBC 7.2 8.0 6.5 6.3  HGB 10.6* 11.0* 10.8* 11.5*  HCT 33.0* 34.9* 33.2* 36.1  MCV 89.7 89.5 88.5 88.5  PLT 225 261 287 313    Basic Metabolic Panel: Recent Labs  Lab 08/24/22 0419 08/26/22 0354 08/28/22 0332 08/29/22 0657  NA 139 137 139 139  K 3.5 3.4* 3.6 3.7  CL 104 104 107  106  CO2 26 24 23 24   GLUCOSE 81 88 86 92  BUN 7* 7* 7* 6*  CREATININE 0.83 0.74 0.61 0.66  CALCIUM 8.7* 8.5* 8.9 8.9  MG  --  2.1 2.0  --   PHOS  --  3.9  --   --     GFR: Estimated Creatinine Clearance: 92.5 mL/min (by C-G formula based on SCr of 0.66 mg/dL). Liver Function Tests: Recent Labs  Lab 08/24/22 0419  AST 14*  ALT 13  ALKPHOS 48  BILITOT 0.5  PROT 7.0  ALBUMIN 2.8*    No results for input(s): "LIPASE", "AMYLASE" in the last 168 hours. No results for input(s): "AMMONIA" in the last 168 hours. Coagulation Profile: No results for input(s): "INR", "PROTIME" in the last 168 hours. Cardiac Enzymes: No results for input(s): "CKTOTAL", "CKMB", "CKMBINDEX", "TROPONINI" in the last 168 hours. BNP (last 3 results) No results for input(s): "PROBNP" in the last 8760 hours. HbA1C: No results for input(s): "HGBA1C" in the last 72 hours. CBG: No results for input(s): "GLUCAP" in the last 168 hours. Lipid Profile: No results for input(s): "CHOL", "HDL", "LDLCALC", "TRIG", "CHOLHDL", "LDLDIRECT" in the last 72 hours. Thyroid Function Tests: No results for input(s): "TSH", "T4TOTAL", "FREET4", "T3FREE", "THYROIDAB" in the last 72 hours. Anemia Panel: No results for input(s): "VITAMINB12", "FOLATE", "FERRITIN", "TIBC", "IRON", "RETICCTPCT" in the last 72 hours. Sepsis Labs: No results for input(s): "PROCALCITON", "LATICACIDVEN" in the last 168 hours.  Recent Results (from the past 240 hour(s))  Culture, blood (Routine X 2) w Reflex to ID Panel     Status: None   Collection Time: 08/22/22  3:58 PM   Specimen: BLOOD  Result Value Ref Range Status   Specimen Description   Final    BLOOD BLOOD RIGHT ARM Performed at [facility], [address], [place], [place]    Special Requests   Final    BOTTLES DRAWN AEROBIC ONLY Blood Culture adequate volume Performed at [facility], [address], [place], [place]     Culture   Final    NO GROWTH 5 DAYS Performed at [facility], [address], [place], [place]    Report Status 08/27/2022 FINAL  Final  Culture, blood (Routine X 2) w Reflex to ID Panel     Status: None   Collection Time: 08/22/22  3:58 PM   Specimen: BLOOD  Result Value Ref Range Status   Specimen Description   Final    BLOOD BLOOD RIGHT HAND Performed at [facility], [address] [place], [place]    Special Requests   Final    BOTTLES DRAWN AEROBIC ONLY Blood Culture adequate volume Performed at [facility], [address], [place], [place]    Culture   Final    NO GROWTH 5 DAYS  Performed at [facility], [address] [place], [place] [address]    Report Status 08/27/2022 FINAL  Final         Radiology Studies: No results found.      Scheduled Meds:  amLODipine  10 mg Oral Q supper   cloNIDine  0.1 mg Oral Q supper   enoxaparin (LOVENOX) injection  40 mg Subcutaneous Q24H   feeding supplement  1 Container Oral TID BM   multivitamin with minerals  1 tablet Oral Daily   pantoprazole  40 mg Oral Daily   polyethylene glycol  17 g Oral BID   saccharomyces boulardii  250 mg Oral BID   senna-docusate  2 tablet Oral BID   Continuous Infusions:  sodium chloride 50 mL/hr at 08/30/22 0700   ceFEPime (MAXIPIME) IV Stopped (08/30/22 8016)   metronidazole Stopped (08/30/22 0606)          Glade Lloyd, MD [facility] 08/30/2022, 7:54 AM

## 2022-08-30 NOTE — Consult Note (Signed)
WOC Nurse requested for preoperative stoma site marking  Discussed surgical procedure and stoma creation with patient.  Explained role of the WOC nurse team.  Provided the patient with educational booklet and provided samples of pouching options.  Answered patient questions.  We discuss twice weekly pouch changes, emptying when 1/3 full.  She is interested in pouch covers.  We discuss showering and intimacy with an ostomy.  She is encouraged and looks forward to feeling better.   Examined patient lying, sitting, and standing in order to place the marking in the patient's visual field, away from any creases or abdominal contour issues and within the rectus muscle. Patient wears pants just above her umbilicus,.  She cannot visualize stoma site below the umbilicus. She has a rounded, soft abdomen. We mark her above umbilicus today in LUQ and RUQ.  Marked for colostomy in the LUQ  4 cm to the left of the umbilicus and 5 cm above the umbilicus.  Marked for ileostomy in the RUQ 4 cm to the right of the umbilicus and  4 cm above the umbilicus.  Patient's abdomen cleansed with CHG wipes at site markings, allowed to air dry prior to marking.Covered mark with thin film transparent dressing to preserve mark until date of surgery.   WOC Nurse team will follow up with patient after surgery for continue ostomy care and teaching.   Maple Hudson MSN, RN, FNP-BC CWON Wound, Ostomy, Continence Nurse Pager [phone]

## 2022-08-30 NOTE — Progress Notes (Signed)
Mobility Specialist - Progress Note   08/30/22 1215  Mobility  HOB Elevated/Bed Position Self regulated  Activity Ambulated with assistance in hallway  Range of Motion/Exercises Active  Level of Assistance Modified independent, requires aide device or extra time  Assistive Device  (IV Pole)  Distance Ambulated (ft) 350 ft  Activity Response Tolerated well  Transport method Ambulatory  $Mobility charge 1 Mobility   Pt received in bed and agreeable to mobility. C/o feeling weaker today compared to yesterday. Pt to bed after session with all needs met.     [facility]

## 2022-08-30 NOTE — Progress Notes (Signed)
[facility] Progress Note     Subjective: CC-  Feeling slightly better than yesterday, but still having some LLQ pain and nausea. She had a small BM after suppository yesterday. No flatus today. Vomited with PO intake yesterday. Just took zofran prior to eating a little breakfast this morning.  Objective: Vital signs in last 24 hours: Temp:  [98.3 F (36.8 C)-99.1 F (37.3 C)] 98.3 F (36.8 C) (09/06 2151) Pulse Rate:  [70-74] 74 (09/06 2151) Resp:  [16-17] 17 (09/06 2151) BP: (134-144)/(88-89) 134/89 (09/06 2151) SpO2:  [95 %-96 %] 95 % (09/06 2151) Last BM Date : 08/29/22  Intake/Output from previous day: 09/06 0701 - 09/07 0700 In: 2518.9 [P.O.:600; I.V.:1118.9; IV Piggyback:800] Out: -  Intake/Output this shift: No intake/output data recorded.  PE: Gen:  Alert, NAD, pleasant Abd: soft, ND, mild LLQ TTP without peritonitis  Lab Results:  Recent Labs    08/28/22 0332 08/29/22 0657  WBC 6.5 6.3  HGB 10.8* 11.5*  HCT 33.2* 36.1  PLT 287 313   BMET Recent Labs    08/28/22 0332 08/29/22 0657  NA 139 139  K 3.6 3.7  CL 107 106  CO2 23 24  GLUCOSE 86 92  BUN 7* 6*  CREATININE 0.61 0.66  CALCIUM 8.9 8.9   PT/INR No results for input(s): "LABPROT", "INR" in the last 72 hours. CMP     Component Value Date/Time   NA 139 08/29/2022 0657   K 3.7 08/29/2022 0657   CL 106 08/29/2022 0657   CO2 24 08/29/2022 0657   GLUCOSE 92 08/29/2022 0657   BUN 6 (L) 08/29/2022 0657   CREATININE 0.66 08/29/2022 0657   CALCIUM 8.9 08/29/2022 0657   PROT 7.0 08/24/2022 0419   ALBUMIN 2.8 (L) 08/24/2022 0419   AST 14 (L) 08/24/2022 0419   ALT 13 08/24/2022 0419   ALKPHOS 48 08/24/2022 0419   BILITOT 0.5 08/24/2022 0419   GFRNONAA >60 08/29/2022 0657   GFRAA >60 06/28/2020 0548   Lipase     Component Value Date/Time   LIPASE 26 08/21/2022 2306       Studies/Results: No results found.  Anti-infectives: Anti-infectives (From admission, onward)     Start     Dose/Rate Route Frequency Ordered Stop   08/26/22 1400  ceFEPIme (MAXIPIME) 2 g in sodium chloride 0.9 % 100 mL IVPB        2 g 200 mL/hr over 30 Minutes Intravenous Every 8 hours 08/26/22 0848     08/23/22 0400  cefTRIAXone (ROCEPHIN) 2 g in sodium chloride 0.9 % 100 mL IVPB  Status:  Discontinued        2 g 200 mL/hr over 30 Minutes Intravenous Every 24 hours 08/22/22 0345 08/26/22 0848   08/22/22 1400  metroNIDAZOLE (FLAGYL) IVPB 500 mg        500 mg 100 mL/hr over 60 Minutes Intravenous Every 12 hours 08/22/22 0345     08/22/22 0100  cefTRIAXone (ROCEPHIN) 2 g in sodium chloride 0.9 % 100 mL IVPB        2 g 200 mL/hr over 30 Minutes Intravenous  Once 08/22/22 0047 08/22/22 0226   08/22/22 0100  metroNIDAZOLE (FLAGYL) IVPB 500 mg        500 mg 100 mL/hr over 60 Minutes Intravenous  Once 08/22/22 0047 08/22/22 0412        Assessment/Plan HD#8 - smoldering sigmoid diverticulitis  - She did have a small BM with suppository yesterday, but she is not passing  flatus and is still nauseated. I still think she is going to need surgery. Will ask WOC to see to mark for ostomy. Tentatively plan for surgery tomorrow.   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

## 2022-08-30 NOTE — Care Management Important Message (Signed)
Important Message  Patient Details IM Letter given to the Patient Name: Kristin Gibson MRN: [id number] Date of Birth: 02-06-1956   Medicare Important Message Given:  Yes     Caren Macadam 08/30/2022, 2:03 PM

## 2022-08-30 NOTE — H&P (View-Only) (Signed)
[facility] Progress Note     Subjective: CC-  Feeling slightly better than yesterday, but still having some LLQ pain and nausea. She had a small BM after suppository yesterday. No flatus today. Vomited with PO intake yesterday. Just took zofran prior to eating a little breakfast this morning.  Objective: Vital signs in last 24 hours: Temp:  [98.3 F (36.8 C)-99.1 F (37.3 C)] 98.3 F (36.8 C) (09/06 2151) Pulse Rate:  [70-74] 74 (09/06 2151) Resp:  [16-17] 17 (09/06 2151) BP: (134-144)/(88-89) 134/89 (09/06 2151) SpO2:  [95 %-96 %] 95 % (09/06 2151) Last BM Date : 08/29/22  Intake/Output from previous day: 09/06 0701 - 09/07 0700 In: 2518.9 [P.O.:600; I.V.:1118.9; IV Piggyback:800] Out: -  Intake/Output this shift: No intake/output data recorded.  PE: Gen:  Alert, NAD, pleasant Abd: soft, ND, mild LLQ TTP without peritonitis  Lab Results:  Recent Labs    08/28/22 0332 08/29/22 0657  WBC 6.5 6.3  HGB 10.8* 11.5*  HCT 33.2* 36.1  PLT 287 313   BMET Recent Labs    08/28/22 0332 08/29/22 0657  NA 139 139  K 3.6 3.7  CL 107 106  CO2 23 24  GLUCOSE 86 92  BUN 7* 6*  CREATININE 0.61 0.66  CALCIUM 8.9 8.9   PT/INR No results for input(s): "LABPROT", "INR" in the last 72 hours. CMP     Component Value Date/Time   NA 139 08/29/2022 0657   K 3.7 08/29/2022 0657   CL 106 08/29/2022 0657   CO2 24 08/29/2022 0657   GLUCOSE 92 08/29/2022 0657   BUN 6 (L) 08/29/2022 0657   CREATININE 0.66 08/29/2022 0657   CALCIUM 8.9 08/29/2022 0657   PROT 7.0 08/24/2022 0419   ALBUMIN 2.8 (L) 08/24/2022 0419   AST 14 (L) 08/24/2022 0419   ALT 13 08/24/2022 0419   ALKPHOS 48 08/24/2022 0419   BILITOT 0.5 08/24/2022 0419   GFRNONAA >60 08/29/2022 0657   GFRAA >60 06/28/2020 0548   Lipase     Component Value Date/Time   LIPASE 26 08/21/2022 2306       Studies/Results: No results found.  Anti-infectives: Anti-infectives (From admission, onward)     Start     Dose/Rate Route Frequency Ordered Stop   08/26/22 1400  ceFEPIme (MAXIPIME) 2 g in sodium chloride 0.9 % 100 mL IVPB        2 g 200 mL/hr over 30 Minutes Intravenous Every 8 hours 08/26/22 0848     08/23/22 0400  cefTRIAXone (ROCEPHIN) 2 g in sodium chloride 0.9 % 100 mL IVPB  Status:  Discontinued        2 g 200 mL/hr over 30 Minutes Intravenous Every 24 hours 08/22/22 0345 08/26/22 0848   08/22/22 1400  metroNIDAZOLE (FLAGYL) IVPB 500 mg        500 mg 100 mL/hr over 60 Minutes Intravenous Every 12 hours 08/22/22 0345     08/22/22 0100  cefTRIAXone (ROCEPHIN) 2 g in sodium chloride 0.9 % 100 mL IVPB        2 g 200 mL/hr over 30 Minutes Intravenous  Once 08/22/22 0047 08/22/22 0226   08/22/22 0100  metroNIDAZOLE (FLAGYL) IVPB 500 mg        500 mg 100 mL/hr over 60 Minutes Intravenous  Once 08/22/22 0047 08/22/22 0412        Assessment/Plan HD#8 - smoldering sigmoid diverticulitis  - She did have a small BM with suppository yesterday, but she is not passing  flatus and is still nauseated. I still think she is going to need surgery. Will ask WOC to see to mark for ostomy. Tentatively plan for surgery tomorrow.   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  

## 2022-08-31 ENCOUNTER — Other Ambulatory Visit: Payer: Self-pay

## 2022-08-31 ENCOUNTER — Encounter (HOSPITAL_COMMUNITY): Admission: EM | Disposition: A | Discharge status: Home Health | Payer: Self-pay | Source: Home / Self Care

## 2022-08-31 ENCOUNTER — Encounter (HOSPITAL_COMMUNITY): Payer: Self-pay | Admitting: Internal Medicine

## 2022-08-31 ENCOUNTER — Inpatient Hospital Stay (HOSPITAL_COMMUNITY): Payer: Medicare HMO | Admitting: Certified Registered"

## 2022-08-31 DIAGNOSIS — K5730 Diverticulosis of large intestine without perforation or abscess without bleeding: Secondary | ICD-10-CM | POA: Diagnosis not present

## 2022-08-31 DIAGNOSIS — K56699 Other intestinal obstruction unspecified as to partial versus complete obstruction: Secondary | ICD-10-CM | POA: Diagnosis not present

## 2022-08-31 DIAGNOSIS — K5732 Diverticulitis of large intestine without perforation or abscess without bleeding: Secondary | ICD-10-CM | POA: Diagnosis not present

## 2022-08-31 DIAGNOSIS — D638 Anemia in other chronic diseases classified elsewhere: Secondary | ICD-10-CM | POA: Diagnosis not present

## 2022-08-31 DIAGNOSIS — I10 Essential (primary) hypertension: Secondary | ICD-10-CM | POA: Diagnosis not present

## 2022-08-31 DIAGNOSIS — K5720 Diverticulitis of large intestine with perforation and abscess without bleeding: Secondary | ICD-10-CM | POA: Diagnosis not present

## 2022-08-31 HISTORY — PX: PARTIAL COLECTOMY: SHX5273

## 2022-08-31 LAB — BASIC METABOLIC PANEL
Anion gap: 9 (ref 5–15)
BUN: 8 mg/dL (ref 8–23)
CO2: 24 mmol/L (ref 22–32)
Calcium: 8.8 mg/dL — ABNORMAL LOW (ref 8.9–10.3)
Chloride: 103 mmol/L (ref 98–111)
Creatinine, Ser: 0.7 mg/dL (ref 0.44–1.00)
GFR, Estimated: >60 mL/min (ref >60)
Glucose, Bld: 93 mg/dL (ref 70–99)
Potassium: 3 mmol/L — ABNORMAL LOW (ref 3.5–5.1)
Sodium: 136 mmol/L (ref 135–145)

## 2022-08-31 LAB — CBC
HCT: 36.3 % (ref 36.0–46.0)
Hemoglobin: 11.7 g/dL — ABNORMAL LOW (ref 12.0–15.0)
MCH: 28.6 pg (ref 26.0–34.0)
MCHC: 32.2 g/dL (ref 30.0–36.0)
MCV: 88.8 fL (ref 80.0–100.0)
Platelets: 355 10*3/uL (ref 150–400)
RBC: 4.09 MIL/uL (ref 3.87–5.11)
RDW: 14.4 % (ref 11.5–15.5)
WBC: 8.3 10*3/uL (ref 4.0–10.5)
nRBC: 0 % (ref 0.0–0.2)

## 2022-08-31 LAB — TYPE AND SCREEN
ABO/RH(D): O POS
Antibody Screen: NEGATIVE

## 2022-08-31 SURGERY — COLECTOMY, PARTIAL
Anesthesia: General | Site: Abdomen

## 2022-08-31 MED ORDER — KETAMINE HCL 10 MG/ML IJ SOLN
INTRAMUSCULAR | Status: DC | PRN
  Administered 2022-08-31: 30 mg via INTRAVENOUS

## 2022-08-31 MED ORDER — PROPOFOL 10 MG/ML IV BOLUS
INTRAVENOUS | Status: DC | PRN
  Administered 2022-08-31: 130 mg via INTRAVENOUS

## 2022-08-31 MED ORDER — PHENYLEPHRINE 80 MCG/ML (10ML) SYRINGE FOR IV PUSH (FOR BLOOD PRESSURE SUPPORT)
PREFILLED_SYRINGE | INTRAVENOUS | Status: DC | PRN
  Administered 2022-08-31: 80 ug via INTRAVENOUS

## 2022-08-31 MED ORDER — PHENYLEPHRINE HCL-NACL 20-0.9 MG/250ML-% IV SOLN
INTRAVENOUS | Status: DC | PRN
  Administered 2022-08-31: 20 ug/min via INTRAVENOUS

## 2022-08-31 MED ORDER — ENOXAPARIN SODIUM 40 MG/0.4ML IJ SOSY
40.0000 mg | PREFILLED_SYRINGE | INTRAMUSCULAR | Status: DC
  Administered 2022-09-01 – 2022-09-05 (×5): 40 mg via SUBCUTANEOUS
  Filled 2022-08-31 (×4): qty 0.4

## 2022-08-31 MED ORDER — KETOROLAC TROMETHAMINE 30 MG/ML IJ SOLN: 30.0000 mg | Freq: Once | INTRAMUSCULAR | Status: DC | PRN

## 2022-08-31 MED ORDER — HYDROMORPHONE HCL 1 MG/ML IJ SOLN
0.2500 mg | INTRAMUSCULAR | Status: DC | PRN
  Administered 2022-08-31 (×4): 0.5 mg via INTRAVENOUS

## 2022-08-31 MED ORDER — SUCCINYLCHOLINE CHLORIDE 200 MG/10ML IV SOSY
PREFILLED_SYRINGE | INTRAVENOUS | Status: DC | PRN
  Administered 2022-08-31: 140 mg via INTRAVENOUS

## 2022-08-31 MED ORDER — SUCCINYLCHOLINE CHLORIDE 200 MG/10ML IV SOSY
PREFILLED_SYRINGE | INTRAVENOUS | Status: AC
  Filled 2022-08-31: qty 10

## 2022-08-31 MED ORDER — ONDANSETRON HCL 4 MG/2ML IJ SOLN
INTRAMUSCULAR | Status: AC
  Filled 2022-08-31: qty 2

## 2022-08-31 MED ORDER — PHENYLEPHRINE 80 MCG/ML (10ML) SYRINGE FOR IV PUSH (FOR BLOOD PRESSURE SUPPORT)
PREFILLED_SYRINGE | INTRAVENOUS | Status: AC
  Filled 2022-08-31: qty 10

## 2022-08-31 MED ORDER — DEXAMETHASONE SODIUM PHOSPHATE 10 MG/ML IJ SOLN
INTRAMUSCULAR | Status: DC | PRN
  Administered 2022-08-31: 4 mg via INTRAVENOUS

## 2022-08-31 MED ORDER — KETAMINE HCL 10 MG/ML IJ SOLN
INTRAMUSCULAR | Status: AC
  Filled 2022-08-31: qty 1

## 2022-08-31 MED ORDER — HYDROMORPHONE HCL 1 MG/ML IJ SOLN
INTRAMUSCULAR | Status: AC
  Filled 2022-08-31: qty 2

## 2022-08-31 MED ORDER — ROCURONIUM BROMIDE 10 MG/ML (PF) SYRINGE
PREFILLED_SYRINGE | INTRAVENOUS | Status: AC
  Filled 2022-08-31: qty 10

## 2022-08-31 MED ORDER — FENTANYL CITRATE (PF) 250 MCG/5ML IJ SOLN
INTRAMUSCULAR | Status: DC | PRN
Start: 2022-08-31 — End: 2022-08-31
  Administered 2022-08-31 (×4): 50 ug via INTRAVENOUS

## 2022-08-31 MED ORDER — SUGAMMADEX SODIUM 500 MG/5ML IV SOLN
INTRAVENOUS | Status: AC
  Filled 2022-08-31: qty 5

## 2022-08-31 MED ORDER — FENTANYL CITRATE (PF) 100 MCG/2ML IJ SOLN
INTRAMUSCULAR | Status: AC
  Filled 2022-08-31: qty 2

## 2022-08-31 MED ORDER — LIDOCAINE 20MG/ML (2%) 15 ML SYRINGE OPTIME
INTRAMUSCULAR | Status: DC | PRN
  Administered 2022-08-31: 1.5 mg/kg/h via INTRAVENOUS

## 2022-08-31 MED ORDER — SUGAMMADEX SODIUM 200 MG/2ML IV SOLN
INTRAVENOUS | Status: DC | PRN
  Administered 2022-08-31: 250 mg via INTRAVENOUS

## 2022-08-31 MED ORDER — LACTATED RINGERS IV SOLN: INTRAVENOUS | Status: DC

## 2022-08-31 MED ORDER — MORPHINE SULFATE (PF) 2 MG/ML IV SOLN
1.0000 mg | INTRAVENOUS | Status: DC | PRN
  Administered 2022-08-31: 4 mg via INTRAVENOUS
  Administered 2022-08-31 – 2022-09-01 (×2): 2 mg via INTRAVENOUS
  Administered 2022-09-01: 4 mg via INTRAVENOUS
  Administered 2022-09-01: 2 mg via INTRAVENOUS
  Filled 2022-08-31 (×3): qty 2
  Filled 2022-08-31 (×2): qty 1

## 2022-08-31 MED ORDER — MIDAZOLAM HCL 2 MG/2ML IJ SOLN
INTRAMUSCULAR | Status: DC | PRN
  Administered 2022-08-31 (×2): 1 mg via INTRAVENOUS

## 2022-08-31 MED ORDER — 0.9 % SODIUM CHLORIDE (POUR BTL) OPTIME
TOPICAL | Status: DC | PRN
  Administered 2022-08-31: 1000 mL
  Administered 2022-08-31: 2000 mL

## 2022-08-31 MED ORDER — ROCURONIUM BROMIDE 10 MG/ML (PF) SYRINGE
PREFILLED_SYRINGE | INTRAVENOUS | Status: DC | PRN
  Administered 2022-08-31: 50 mg via INTRAVENOUS
  Administered 2022-08-31: 10 mg via INTRAVENOUS

## 2022-08-31 MED ORDER — METHOCARBAMOL 1000 MG/10ML IJ SOLN
1000.0000 mg | Freq: Three times a day (TID) | INTRAVENOUS | Status: DC
  Administered 2022-08-31 – 2022-09-01 (×2): 1000 mg via INTRAVENOUS
  Filled 2022-08-31 (×2): qty 1000

## 2022-08-31 MED ORDER — MEPERIDINE HCL 50 MG/ML IJ SOLN: 6.2500 mg | INTRAMUSCULAR | Status: DC | PRN

## 2022-08-31 MED ORDER — MIDAZOLAM HCL 2 MG/2ML IJ SOLN
INTRAMUSCULAR | Status: AC
  Filled 2022-08-31: qty 2

## 2022-08-31 MED ORDER — LACTATED RINGERS IV SOLN: INTRAVENOUS | Status: DC | PRN

## 2022-08-31 MED ORDER — ONDANSETRON HCL 4 MG/2ML IJ SOLN
INTRAMUSCULAR | Status: DC | PRN
  Administered 2022-08-31: 4 mg via INTRAVENOUS

## 2022-08-31 MED ORDER — DEXAMETHASONE SODIUM PHOSPHATE 10 MG/ML IJ SOLN
INTRAMUSCULAR | Status: AC
  Filled 2022-08-31: qty 1

## 2022-08-31 MED ORDER — LIDOCAINE 2% (20 MG/ML) 5 ML SYRINGE
INTRAMUSCULAR | Status: DC | PRN
  Administered 2022-08-31: 40 mg via INTRAVENOUS

## 2022-08-31 MED ORDER — PROMETHAZINE HCL 25 MG/ML IJ SOLN: 6.2500 mg | INTRAMUSCULAR | Status: DC | PRN

## 2022-08-31 MED ORDER — ACETAMINOPHEN 500 MG PO TABS
1000.0000 mg | ORAL_TABLET | Freq: Four times a day (QID) | ORAL | Status: DC
  Administered 2022-08-31 – 2022-09-10 (×24): 1000 mg via ORAL
  Filled 2022-08-31 (×33): qty 2

## 2022-08-31 SURGICAL SUPPLY — 50 items
APL PRP STRL LF DISP 70% ISPRP (MISCELLANEOUS)
BAG COUNTER SPONGE SURGICOUNT (BAG) IMPLANT
BAG SPNG CNTER NS LX DISP (BAG)
BLADE EXTENDED COATED 6.5IN (ELECTRODE) IMPLANT
CHLORAPREP W/TINT 26 (MISCELLANEOUS) ×2 IMPLANT
COVER SURGICAL LIGHT HANDLE (MISCELLANEOUS) ×2 IMPLANT
DRSG OPSITE POSTOP 4X10 (GAUZE/BANDAGES/DRESSINGS) IMPLANT
DRSG OPSITE POSTOP 4X6 (GAUZE/BANDAGES/DRESSINGS) IMPLANT
DRSG OPSITE POSTOP 4X8 (GAUZE/BANDAGES/DRESSINGS) IMPLANT
DRSG VAC ATS LRG SENSATRAC (GAUZE/BANDAGES/DRESSINGS) IMPLANT
ELECT REM PT RETURN 15FT ADLT (MISCELLANEOUS) ×1 IMPLANT
GAUZE PAD ABD 8X10 STRL (GAUZE/BANDAGES/DRESSINGS) IMPLANT
GAUZE SPONGE 4X4 12PLY STRL (GAUZE/BANDAGES/DRESSINGS) IMPLANT
GLOVE SURG ORTHO 8.0 STRL STRW (GLOVE) ×2 IMPLANT
GLOVE SURG SYN 7.5  E (GLOVE) ×1
GLOVE SURG SYN 7.5 E (GLOVE) ×1 IMPLANT
GLOVE SURG SYN 7.5 PF PI (GLOVE) ×1 IMPLANT
GOWN STRL REUS W/ TWL XL LVL3 (GOWN DISPOSABLE) ×4 IMPLANT
GOWN STRL REUS W/TWL XL LVL3 (GOWN DISPOSABLE) ×4
HANDLE SUCTION POOLE (INSTRUMENTS) IMPLANT
KIT TURNOVER KIT A (KITS) IMPLANT
LIGASURE IMPACT 36 18CM CVD LR (INSTRUMENTS) IMPLANT
PACK COLON (CUSTOM PROCEDURE TRAY) ×1 IMPLANT
PENCIL SMOKE EVACUATOR (MISCELLANEOUS) IMPLANT
RELOAD PROXIMATE 75MM BLUE (ENDOMECHANICALS) IMPLANT
RELOAD STAPLE 75 3.8 BLU REG (ENDOMECHANICALS) IMPLANT
SPONGE T-LAP 18X18 ~~LOC~~+RFID (SPONGE) IMPLANT
STAPLER GUN LINEAR PROX 60 (STAPLE) IMPLANT
STAPLER PROXIMATE 75MM BLUE (STAPLE) IMPLANT
STAPLER VISISTAT 35W (STAPLE) ×1 IMPLANT
SUCTION POOLE HANDLE (INSTRUMENTS) ×1
SUT NOV 1 T60/GS (SUTURE) IMPLANT
SUT NOVA NAB DX-16 0-1 5-0 T12 (SUTURE) IMPLANT
SUT NOVA NAB GS-21 1 T12 (SUTURE) IMPLANT
SUT NOVA T20/GS 25 (SUTURE) IMPLANT
SUT PDS AB 1 TP1 96 (SUTURE) IMPLANT
SUT PROLENE 2 0 SH DA (SUTURE) IMPLANT
SUT SILK 2 0 (SUTURE) ×1
SUT SILK 2 0 SH CR/8 (SUTURE) ×1 IMPLANT
SUT SILK 2 0SH CR/8 30 (SUTURE) IMPLANT
SUT SILK 2-0 18XBRD TIE 12 (SUTURE) ×1 IMPLANT
SUT SILK 2-0 30XBRD TIE 12 (SUTURE) ×1 IMPLANT
SUT SILK 3 0 (SUTURE) ×1
SUT SILK 3 0 SH CR/8 (SUTURE) ×1 IMPLANT
SUT SILK 3-0 18XBRD TIE 12 (SUTURE) ×1 IMPLANT
SUT VIC AB 3-0 SH 18 (SUTURE) IMPLANT
TOWEL OR 17X26 10 PK STRL BLUE (TOWEL DISPOSABLE) IMPLANT
TOWEL OR NON WOVEN STRL DISP B (DISPOSABLE) ×2 IMPLANT
TRAY FOLEY MTR SLVR 16FR STAT (SET/KITS/TRAYS/PACK) ×1 IMPLANT
TUBING CONNECTING 10 (TUBING) ×2 IMPLANT

## 2022-08-31 NOTE — Progress Notes (Signed)
PROGRESS NOTE    Kristin Gibson  ZPH:[id number] DOB: 1956/09/10 DOA: 08/21/2022 PCP: Woodroe Chen, MD   Brief Narrative:  66 year old female with history of gastric sleeve surgery for weight loss February 2093, recurrent diverticulitis, recently completed 2 rounds of antibiotics presented with worsening abdominal pain with fever and chills. She was admitted with sigmoid diverticulitis and started on iv antibiotics. General surgery was consulted.  Assessment & Plan:   Recurrent sigmoid diverticulitis with failed outpatient treatment -Patient has history of recurrent diverticulitis and failed recent outpatient antibiotic point -Repeat CT scan on 08/25/2022 showed persistent inflammation; antibiotics were switched to cefepime and Flagyl. -General surgery following: Planning for surgical intervention today.  Continue IV fluids.  Continue pain management.  Essential hypertension -Continue amlodipine, continue.  Blood pressure intermittently elevated  Obesity, class II -Outpatient follow-up  GERD -Continue PPI  Hypokalemia -Improved  Anemia of chronic disease -Possibly from chronic illnesses.  Hemoglobin stable  DVT prophylaxis: Lovenox Code Status: Full Family Communication: None at bedside Disposition Plan: Status is: Inpatient Remains inpatient appropriate because: Of severity of illness    Consultants: General surgery  Procedures: None  Antimicrobials:  Anti-infectives (From admission, onward)    Start     Dose/Rate Route Frequency Ordered Stop   08/31/22 0600  cefoTEtan (CEFOTAN) 2 g in sodium chloride 0.9 % 100 mL IVPB        2 g 200 mL/hr over 30 Minutes Intravenous On call to O.R. 08/30/22 1316 09/01/22 0559   08/26/22 1400  ceFEPIme (MAXIPIME) 2 g in sodium chloride 0.9 % 100 mL IVPB        2 g 200 mL/hr over 30 Minutes Intravenous Every 8 hours 08/26/22 0848     08/23/22 0400  cefTRIAXone (ROCEPHIN) 2 g in sodium chloride 0.9 % 100 mL IVPB  Status:   Discontinued        2 g 200 mL/hr over 30 Minutes Intravenous Every 24 hours 08/22/22 0345 08/26/22 0848   08/22/22 1400  metroNIDAZOLE (FLAGYL) IVPB 500 mg        500 mg 100 mL/hr over 60 Minutes Intravenous Every 12 hours 08/22/22 0345     08/22/22 0100  cefTRIAXone (ROCEPHIN) 2 g in sodium chloride 0.9 % 100 mL IVPB        2 g 200 mL/hr over 30 Minutes Intravenous  Once 08/22/22 0047 08/22/22 0226   08/22/22 0100  metroNIDAZOLE (FLAGYL) IVPB 500 mg        500 mg 100 mL/hr over 60 Minutes Intravenous  Once 08/22/22 0047 08/22/22 0412        Subjective: Patient seen and examined at bedside.  Still intermittently nauseous with lower abdominal pain.  No fever, chest pain, worsening shortness of reported. Objective: Vitals:   08/30/22 1047 08/30/22 1314 08/30/22 2022 08/31/22 0439  BP: (!) 139/92 (!) 144/90 137/87 130/77  Pulse: 74 77 92 90  Resp: 18 16 18 18   Temp: 98.7 F (37.1 C) 98.6 F (37 C) 99.3 F (37.4 C) 99.5 F (37.5 C)  TempSrc: Oral  Oral Oral  SpO2: 96% 97% 100% 95%  Weight:      Height:        Intake/Output Summary (Last 24 hours) at 08/31/2022 0752 Last data filed at 08/31/2022 0700 Gross per 24 hour  Intake 1453.88 ml  Output --  Net 1453.88 ml    Filed Weights   08/21/22 2220  Weight: 112.5 kg    Examination:  General exam: No distress currently.  Still  on room air.   Respiratory system: Bilateral decreased breath sounds at bases, no wheezing  cardiovascular system: S1-S2 heard, rate controlled currently gastrointestinal system: Abdomen is distended slightly, soft and mildly tender in the lower quadrant.  Bowel sounds are sluggish  Data Reviewed: I have personally reviewed following labs and imaging studies  CBC: Recent Labs  Lab 08/26/22 0354 08/28/22 0332 08/29/22 0657  WBC 8.0 6.5 6.3  HGB 11.0* 10.8* 11.5*  HCT 34.9* 33.2* 36.1  MCV 89.5 88.5 88.5  PLT 261 287 313    Basic Metabolic Panel: Recent Labs  Lab 08/26/22 0354  08/28/22 0332 08/29/22 0657  NA 137 139 139  K 3.4* 3.6 3.7  CL 104 107 106  CO2 24 23 24   GLUCOSE 88 86 92  BUN 7* 7* 6*  CREATININE 0.74 0.61 0.66  CALCIUM 8.5* 8.9 8.9  MG 2.1 2.0  --   PHOS 3.9  --   --     GFR: Estimated Creatinine Clearance: 92.5 mL/min (by C-G formula based on SCr of 0.66 mg/dL). Liver Function Tests: No results for input(s): "AST", "ALT", "ALKPHOS", "BILITOT", "PROT", "ALBUMIN" in the last 168 hours.  No results for input(s): "LIPASE", "AMYLASE" in the last 168 hours. No results for input(s): "AMMONIA" in the last 168 hours. Coagulation Profile: No results for input(s): "INR", "PROTIME" in the last 168 hours. Cardiac Enzymes: No results for input(s): "CKTOTAL", "CKMB", "CKMBINDEX", "TROPONINI" in the last 168 hours. BNP (last 3 results) No results for input(s): "PROBNP" in the last 8760 hours. HbA1C: No results for input(s): "HGBA1C" in the last 72 hours. CBG: No results for input(s): "GLUCAP" in the last 168 hours. Lipid Profile: No results for input(s): "CHOL", "HDL", "LDLCALC", "TRIG", "CHOLHDL", "LDLDIRECT" in the last 72 hours. Thyroid Function Tests: No results for input(s): "TSH", "T4TOTAL", "FREET4", "T3FREE", "THYROIDAB" in the last 72 hours. Anemia Panel: No results for input(s): "VITAMINB12", "FOLATE", "FERRITIN", "TIBC", "IRON", "RETICCTPCT" in the last 72 hours. Sepsis Labs: No results for input(s): "PROCALCITON", "LATICACIDVEN" in the last 168 hours.  Recent Results (from the past 240 hour(s))  Culture, blood (Routine X 2) w Reflex to ID Panel     Status: None   Collection Time: 08/22/22  3:58 PM   Specimen: BLOOD  Result Value Ref Range Status   Specimen Description   Final    BLOOD BLOOD RIGHT ARM Performed at [facility], [address], [place], [place]    Special Requests   Final    BOTTLES DRAWN AEROBIC ONLY Blood Culture adequate volume Performed at [facility], [address] [place], [place]    Culture   Final    NO GROWTH 5 DAYS Performed at [facility], [address] [place], [place]    Report Status 08/27/2022 FINAL  Final  Culture, blood (Routine X 2) w Reflex to ID Panel     Status: None   Collection Time: 08/22/22  3:58 PM   Specimen: BLOOD  Result Value Ref Range Status   Specimen Description   Final    BLOOD BLOOD RIGHT HAND Performed at [facility], [address], [place], [place]    Special Requests   Final    BOTTLES DRAWN AEROBIC ONLY Blood Culture adequate volume Performed at [facility], [address] [place], [place]    Culture   Final    NO GROWTH 5 DAYS Performed at [facility], [address] [place], [place] [address]    Report Status 08/27/2022 FINAL  Final         Radiology Studies: No results found.      Scheduled Meds:  amLODipine  10 mg Oral Q supper   cloNIDine  0.1 mg Oral Q supper   enoxaparin (LOVENOX) injection  40 mg Subcutaneous Q24H   feeding supplement  1 Container Oral TID BM   multivitamin with minerals  1 tablet Oral Daily   pantoprazole  40 mg Oral Daily   polyethylene glycol  17 g Oral BID   saccharomyces boulardii  250 mg Oral BID   senna-docusate  2 tablet Oral BID   Continuous Infusions:  sodium chloride 50 mL/hr at 08/31/22 0700   ceFEPime (MAXIPIME) IV Stopped (08/31/22 0656)   cefoTEtan (CEFOTAN) IV     metronidazole Stopped (08/31/22 6283)          Glade Lloyd, MD [facility] 08/31/2022, 7:52 AM

## 2022-08-31 NOTE — Transfer of Care (Signed)
Immediate Anesthesia Transfer of Care Note  Patient: Kristin Gibson  Procedure(s) Performed: OPEN PARTIAL COLECTOMY WITH COLOSTOMY (Abdomen)  Patient Location: PACU  Anesthesia Type:General  Level of Consciousness: awake, alert  and patient cooperative  Airway & Oxygen Therapy: Patient Spontanous Breathing and Patient connected to face mask oxygen  Post-op Assessment: Report given to RN and Post -op Vital signs reviewed and stable  Post vital signs: Reviewed and stable  Last Vitals:  Vitals Value Taken Time  BP 154/96 08/31/22 1308  Temp    Pulse 115 08/31/22 1311  Resp 25 08/31/22 1311  SpO2 99 % 08/31/22 1311  Vitals shown include unvalidated device data.  Last Pain:  Vitals:   08/31/22 0815  TempSrc:   PainSc: 6       Patients Stated Pain Goal: 0 (08/30/22 1310)  Complications: No notable events documented.

## 2022-08-31 NOTE — Interval H&P Note (Signed)
History and Physical Interval Note:  08/31/2022 10:33 AM  Kristin Gibson  has presented today for surgery, with the diagnosis of RECURRENT DIVERTICULITIS.  The various methods of treatment have been discussed with the patient and family. After consideration of risks, benefits and other options for treatment, the patient has consented to    Procedure(s): OPEN PARTIAL COLECTOMY WITH COLOSTOMY (N/A) as a surgical intervention.    The patient's history has been reviewed, patient examined, no change in status, stable for surgery.  I have reviewed the patient's chart and labs.  Questions were answered to the patient's satisfaction.    Darnell Level, MD [facility] practice Office: [phone]   Darnell Level

## 2022-08-31 NOTE — Anesthesia Procedure Notes (Signed)
Procedure Name: Intubation Date/Time: 08/31/2022 10:55 AM  Performed by: Eben Burow, CRNAPre-anesthesia Checklist: Patient identified, Emergency Drugs available, Suction available, Patient being monitored and Timeout performed Patient Re-evaluated:Patient Re-evaluated prior to induction Oxygen Delivery Method: Circle system utilized Preoxygenation: Pre-oxygenation with 100% oxygen Induction Type: Rapid sequence and Cricoid Pressure applied Laryngoscope Size: Mac and 4 Grade View: Grade I Tube type: Oral Tube size: 7.0 mm Number of attempts: 1 Airway Equipment and Method: Stylet Placement Confirmation: ETT inserted through vocal cords under direct vision, positive ETCO2 and breath sounds checked- equal and bilateral Secured at: 21 cm Tube secured with: Tape Dental Injury: Teeth and Oropharynx as per pre-operative assessment

## 2022-08-31 NOTE — Anesthesia Postprocedure Evaluation (Signed)
Anesthesia Post Note  Patient: Kristin Gibson  Procedure(s) Performed: OPEN PARTIAL COLECTOMY WITH COLOSTOMY (Abdomen)     Patient location during evaluation: PACU Anesthesia Type: General Level of consciousness: awake and alert Pain management: pain level controlled Vital Signs Assessment: post-procedure vital signs reviewed and stable Respiratory status: spontaneous breathing, nonlabored ventilation, respiratory function stable and patient connected to nasal cannula oxygen Cardiovascular status: blood pressure returned to baseline and stable Postop Assessment: no apparent nausea or vomiting Anesthetic complications: no   No notable events documented.  Last Vitals:  Vitals:   08/31/22 1430 08/31/22 1503  BP: (!) 157/100 (!) 148/95  Pulse: (!) 118 (!) 120  Resp: (!) 22 20  Temp:  37.2 C  SpO2: 94% 92%    Last Pain:  Vitals:   08/31/22 1503  TempSrc: Oral  PainSc:                  [name]

## 2022-08-31 NOTE — Op Note (Signed)
Operative Note  Pre-operative Diagnosis:  Diverticulitis with colonic obstruction  Post-operative Diagnosis:  same  Surgeon:  Darnell Level, MD  Assistant:  Barnetta Chapel, PA-C   Procedure:  Exploratory laparotomy, sigmoid colectomy, descending colostomy, VAC dressing placement  Anesthesia:  general  Estimated Blood Loss:  100 cc  Drains: VAC dressing to midline wound         Specimen: sigmoid colon to pathology  Indications: Patient is a 66 year old female with a history of diverticular disease.  Patient was readmitted with signs of acute diverticulitis.  She has developed signs and symptoms of colonic obstruction.  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  Procedure:  The patient was seen in the pre-op holding area. The risks, benefits, complications, treatment options, and expected outcomes were previously discussed with the patient. The patient agreed with the proposed plan and has signed the informed consent form.  The patient was brought to the operating room by the surgical team, identified as Kristin Gibson and the procedure verified. A "time out" was completed and the above information confirmed.  Following administration of general endotracheal anesthesia, the patient is positioned and then prepped and draped in the usual aseptic fashion.  After ascertaining that an adequate level of anesthesia been achieved, a midline abdominal incision is made with a #10 blade.  Dissection is carried through subcutaneous tissues.  Hemostasis is achieved with the electrocautery.  Fascia was incised in the midline.  Peritoneal cavity is entered cautiously.  There is a small amount of cloudy fluid present within the lower abdomen.  Small bowel was viable.  Cecum is markedly distended as is the entire ascending colon transverse colon and descending colon.  The proximal  sigmoid colon and essentially midportion to the distal sigmoid colon is all markedly hard, firm, indurated, and somewhat fixed to the retroperitoneum and pelvic sidewall.  Using gentle blunt dissection the sigmoid colon is mobilized.  A Balfour retractor was placed for exposure.  The colon is mobilized laterally.  A point immediately proximal to the diseased segment of the sigmoid colon is identified.  This is likely the distal descending colon.  It is mobilized and transected using a GIA stapler.  The sigmoid colon is then resected using the LigaSure to divide the mesentery very close to the bowel wall.  Dissection is carefully carried distally.  The entire sigmoid colon is essentially involved with this inflammatory process.  At the pelvic peritoneal reflection the inflammatory process ends and there is soft proximal rectum identified.  A TA 60 stapler is placed across the proximal rectum and fired.  The bowel is transected and the entire sigmoid colon is removed and submitted as specimen.  The staple line in the proximal rectum is marked each end with a 2-0 Prolene suture.  Peritoneal cavity is irrigated with warm saline which is evacuated.  Next the descending colon is mobilized along its lateral peritoneal reflection.  The mesentery at the distal end of the descending colon is transected using the LigaSure allowing for enough mobility to bring the bowel up to the left mid abdominal wall for a end colostomy.  An ellipse of skin is excised off the left abdominal wall.  Underlying adipose tissue is excised.  A cruciate incision is made through the fascia and through the rectus musculature.  Peritoneal cavity is entered cautiously.  Using a Babcock clamp the end of the descending  colon is delivered through the abdominal wall.  The colon is secured to the fascia circumferentially with interrupted 2-0 silk sutures.  Abdomen is then again copiously irrigated with warm saline which is evacuated.  Good hemostasis is  noted throughout the operative field.  Small bowel was returned to its normal anatomic location and covered with the remaining portion of the omentum.  Midline fascia is closed with a running #1 Novafil suture.  Subcutaneous tissues are irrigated.  A VAC dressing is then brought onto the field.  A black sponge was cut to the appropriate size and placed into the subcutaneous tissues.  It is covered with the adhesive sheets as normal.  It is placed to vacuum suction.  Next the colostomy is matured.  The distal 2 cm of the bowel appears ischemic.  They are excised using the electrocautery.  The colostomy was then matured to the skin edges circumferentially with interrupted 3-0 Vicryl sutures.  Colostomy appliance is applied.  Patient is awakened from anesthesia and transported to the recovery room in stable condition.  The patient tolerated the procedure well.   Darnell Level, MD [facility] Office: [phone]

## 2022-08-31 NOTE — Anesthesia Preprocedure Evaluation (Addendum)
Anesthesia Evaluation  Patient identified by MRN, date of birth, ID band Patient awake    Reviewed: Allergy & Precautions, NPO status , Patient's Chart, lab work & pertinent test results  History of Anesthesia Complications (+) PONV and history of anesthetic complications  Airway Mallampati: II       Dental  (+) Teeth Intact, Dental Advisory Given   Pulmonary sleep apnea and Continuous Positive Airway Pressure Ventilation , former smoker,    Pulmonary exam normal breath sounds clear to auscultation       Cardiovascular hypertension, Pt. on medications Normal cardiovascular exam     Neuro/Psych PSYCHIATRIC DISORDERS Depression negative neurological ROS     GI/Hepatic Neg liver ROS, GERD  Medicated,  Endo/Other  negative endocrine ROSMorbid obesityBMI 44  Renal/GU negative Renal ROS  negative genitourinary   Musculoskeletal  (+) Arthritis , Osteoarthritis,    Abdominal (+) + obese,   Peds  Hematology negative hematology ROS (+) hct 42.3   Anesthesia Other Findings   Reproductive/Obstetrics negative OB ROS                             Anesthesia Physical  Anesthesia Plan  ASA: 2  Anesthesia Plan: General   Post-op Pain Management: Ofirmev IV (intra-op)*   Induction: Intravenous  PONV Risk Score and Plan: Ondansetron, Dexamethasone, Midazolam, Treatment may vary due to age or medical condition and Scopolamine patch - Pre-op  Airway Management Planned: Oral ETT  Additional Equipment: None  Intra-op Plan:   Post-operative Plan: Extubation in OR  Informed Consent: I have reviewed the patients History and Physical, chart, labs and discussed the procedure including the risks, benefits and alternatives for the proposed anesthesia with the patient or authorized representative who has indicated his/her understanding and acceptance.     Dental advisory given  Plan Discussed with:  CRNA  Anesthesia Plan Comments:         Anesthesia Quick Evaluation

## 2022-09-01 DIAGNOSIS — I10 Essential (primary) hypertension: Secondary | ICD-10-CM | POA: Diagnosis not present

## 2022-09-01 DIAGNOSIS — D638 Anemia in other chronic diseases classified elsewhere: Secondary | ICD-10-CM | POA: Diagnosis not present

## 2022-09-01 DIAGNOSIS — K5732 Diverticulitis of large intestine without perforation or abscess without bleeding: Secondary | ICD-10-CM | POA: Diagnosis not present

## 2022-09-01 MED ORDER — CALCIUM POLYCARBOPHIL 625 MG PO TABS
625.0000 mg | ORAL_TABLET | Freq: Two times a day (BID) | ORAL | Status: DC
  Administered 2022-09-01 – 2022-09-02 (×4): 625 mg via ORAL
  Filled 2022-09-01 (×5): qty 1

## 2022-09-01 MED ORDER — SODIUM CHLORIDE 0.9% FLUSH
3.0000 mL | INTRAVENOUS | Status: DC | PRN
  Administered 2022-09-01: 3 mL via INTRAVENOUS

## 2022-09-01 MED ORDER — SODIUM CHLORIDE 0.9 % IV SOLN: 250.0000 mL | INTRAVENOUS | Status: DC | PRN

## 2022-09-01 MED ORDER — LACTATED RINGERS IV BOLUS: 1000.0000 mL | Freq: Three times a day (TID) | INTRAVENOUS | Status: AC | PRN

## 2022-09-01 MED ORDER — HYDROMORPHONE HCL 1 MG/ML IJ SOLN
0.5000 mg | INTRAMUSCULAR | Status: DC | PRN
  Administered 2022-09-02: 2 mg via INTRAVENOUS
  Administered 2022-09-02 – 2022-09-03 (×3): 1 mg via INTRAVENOUS
  Administered 2022-09-03: 2 mg via INTRAVENOUS
  Administered 2022-09-03 – 2022-09-04 (×2): 1 mg via INTRAVENOUS
  Filled 2022-09-01: qty 1
  Filled 2022-09-01 (×2): qty 2
  Filled 2022-09-01 (×4): qty 1

## 2022-09-01 MED ORDER — LIP MEDEX EX OINT
TOPICAL_OINTMENT | Freq: Two times a day (BID) | CUTANEOUS | Status: DC
  Administered 2022-09-01 (×2): 75 via TOPICAL
  Administered 2022-09-04 – 2022-09-08 (×4): 1 via TOPICAL
  Administered 2022-09-10: 75 via TOPICAL
  Administered 2022-09-10 – 2022-09-14 (×3): 1 via TOPICAL
  Administered 2022-09-18 – 2022-09-19 (×2): 75 via TOPICAL
  Filled 2022-09-01 (×7): qty 7

## 2022-09-01 MED ORDER — METHOCARBAMOL 1000 MG/10ML IJ SOLN
500.0000 mg | Freq: Three times a day (TID) | INTRAVENOUS | Status: DC
  Administered 2022-09-01 – 2022-09-02 (×3): 500 mg via INTRAVENOUS
  Filled 2022-09-01 (×3): qty 500

## 2022-09-01 MED ORDER — ALUM & MAG HYDROXIDE-SIMETH 200-200-20 MG/5ML PO SUSP
30.0000 mL | Freq: Four times a day (QID) | ORAL | Status: DC | PRN
  Filled 2022-09-01: qty 30

## 2022-09-01 MED ORDER — SODIUM CHLORIDE 0.9% FLUSH
3.0000 mL | Freq: Two times a day (BID) | INTRAVENOUS | Status: DC
  Administered 2022-09-01 – 2022-09-19 (×14): 3 mL via INTRAVENOUS

## 2022-09-01 MED ORDER — SODIUM CHLORIDE 0.9 % IV SOLN
8.0000 mg | Freq: Four times a day (QID) | INTRAVENOUS | Status: DC | PRN
  Administered 2022-09-13: 8 mg via INTRAVENOUS
  Filled 2022-09-01 (×2): qty 4

## 2022-09-01 MED ORDER — MENTHOL 3 MG MT LOZG: 1.0000 | LOZENGE | OROMUCOSAL | Status: DC | PRN

## 2022-09-01 MED ORDER — PROCHLORPERAZINE EDISYLATE 10 MG/2ML IJ SOLN
5.0000 mg | INTRAMUSCULAR | Status: DC | PRN
  Administered 2022-09-02: 5 mg via INTRAVENOUS
  Administered 2022-09-03 – 2022-09-13 (×24): 10 mg via INTRAVENOUS
  Filled 2022-09-01 (×26): qty 2

## 2022-09-01 MED ORDER — PHENOL 1.4 % MT LIQD: 2.0000 | OROMUCOSAL | Status: DC | PRN

## 2022-09-01 MED ORDER — ONDANSETRON HCL 4 MG/2ML IJ SOLN
4.0000 mg | Freq: Four times a day (QID) | INTRAMUSCULAR | Status: DC | PRN
  Administered 2022-09-02 – 2022-09-13 (×20): 4 mg via INTRAVENOUS
  Filled 2022-09-01 (×19): qty 2

## 2022-09-01 MED ORDER — MAGIC MOUTHWASH: 15.0000 mL | Freq: Four times a day (QID) | ORAL | Status: DC | PRN

## 2022-09-01 MED ORDER — GABAPENTIN 100 MG PO CAPS
200.0000 mg | ORAL_CAPSULE | Freq: Three times a day (TID) | ORAL | Status: DC
  Administered 2022-09-01 – 2022-09-02 (×4): 200 mg via ORAL
  Filled 2022-09-01 (×4): qty 2

## 2022-09-01 NOTE — Progress Notes (Signed)
Mobility Specialist Cancellation/Refusal Note:   Reason for Cancellation/Refusal: Pt declined mobility at this time. Pt taking pain meds & wants to wait until the afternoon. Will check back as schedule permits.       [facility]

## 2022-09-01 NOTE — Progress Notes (Addendum)
Kristin Gibson [id number] [birth year]  CARE TEAM:  PCP: Woodroe Chen, MD  Outpatient Care Team: Patient Care Team: Woodroe Chen, MD as PCP - General (Internal Medicine) Almond Lint, MD as Consulting Physician (General Surgery)  Inpatient Treatment Team: Treatment Team: Attending Provider: Glade Lloyd, MD; Consulting Physician: Montez Morita, Md, MD; Rounding Team: Shanda Howells, MD; WOC Nurse: Vassie Loll, RN; Utilization Review: Clydia Llano, RN; Registered Nurse: Dallie Piles, RN; Mobility Specialist: Lorina Rabon; Technician: Venia Carbon, NT; Social Worker: Darleene Cleaver, LCSW   Problem List:   Principal Problem:   Sigmoid diverticulitis Active Problems:   Hypertension   GERD (gastroesophageal reflux disease)   Hypokalemia   Anemia of chronic disease   1 Day Post-Op  08/31/2022  Procedure(s): OPEN PARTIAL COLECTOMY WITH COLOSTOMY    Assessment  Stabilizing, post-operative.  [facility] Stay = 10 days)  Plan:  -Vital signs stable, no evidence of acute post-operative infection -No need to continue antibiotics from surgical standpoint past 23 hour prophylaxis, defer to primary service -Continue ERAS protocol -Foley in place, plan to remove on post-op day 2 -Patient currently on bowel rest, plan to advance diet to clears -VTE prophylaxis- SCDs, begin QD enoxaparin injection -Patient pain remains significant on current regimen, switch to Dilaudid. -Mobilize as tolerated to help recovery  Disposition:  Per primary service.   I reviewed last 24 h vitals and pain scores, last 48 h intake and output, last 24 h labs and trends, and last 24 h imaging results. I have reviewed this patient's available data, including medical history, events of note, test results, etc as part of my evaluation.  A significant portion of that time was spent in counseling.  Care during the described time interval was provided by me.  This care required  moderate level of medical decision making.  09/01/2022    Subjective: (Chief complaint)  Patient still has moderate/severe pain, rates 8-9/10. She has had significant stool output via her ostomy bag, and states her abdomen feels much less bloated as a result. She reports no appetite yet, but expresses readiness to advance. She feels some hesitation regarding PT today due to her pain levels.   Objective:  Vital signs:  Vitals:   08/31/22 1503 08/31/22 2205 08/31/22 2228 09/01/22 0531  BP: (!) 148/95 114/77  134/83  Pulse: (!) 120 92 69 95  Resp: 20 16 17 17   Temp: 98.9 F (37.2 C) 98.8 F (37.1 C)  98.6 F (37 C)  TempSrc: Oral Oral  Oral  SpO2: 92% 95% 95% 90%  Weight:      Height:        Last BM Date : 08/31/22  Intake/Output   Yesterday:  09/08 0701 - 09/09 0700 In: 3214 [P.O.:60; I.V.:2574.4; IV Piggyback:579.5] Out: 1765 [Urine:725; Stool:1000; Blood:40] This shift:  Total I/O In: -  Out: 100 [Stool:100]  Bowel function:  Flatus: No gas noted in colostomy bag  BM:  YES  Drain: (No drain)   Physical Exam:  General: Pt awake/alert in moderate acute distress Eyes: PERRL, normal EOM.  Sclera clear.  No icterus Neuro: CN II-XII intact w/o focal sensory/motor deficits. Lymph: No head/neck/groin lymphadenopathy Psych:  No delerium/psychosis/paranoia.  Oriented x 4 HENT: Normocephalic, Mucus membranes moist.  No thrush Neck: Supple, No tracheal deviation.  No obvious thyromegaly Chest: No pain to chest wall compression.  Good respiratory excursion.  No audible wheezing CV:  Pulses intact.  Regular rhythm.  No major extremity edema MS: Normal AROM  mjr joints.  No obvious deformity  Abdomen: Soft.  Nondistended.  Tenderness at incision .  No evidence of peritonitis.  No guarding. No incarcerated hernias.  Ext:  No deformity.  No mjr edema.  No cyanosis Skin: No petechiae / purpurea.  No major sores.  Warm and dry    Results:   Cultures: Recent Results  (from the past 720 hour(s))  Culture, blood (Routine X 2) w Reflex to ID Panel     Status: None   Collection Time: 08/22/22  3:58 PM   Specimen: BLOOD  Result Value Ref Range Status   Specimen Description   Final    BLOOD BLOOD RIGHT ARM Performed at [facility], [address] [place], [place] [address]    Special Requests   Final    BOTTLES DRAWN AEROBIC ONLY Blood Culture adequate volume Performed at [facility], [address], [place], [place] [address]    Culture   Final    NO GROWTH 5 DAYS Performed at [facility], [address] [place], [place] [address]    Report Status 08/27/2022 FINAL  Final  Culture, blood (Routine X 2) w Reflex to ID Panel     Status: None   Collection Time: 08/22/22  3:58 PM   Specimen: BLOOD  Result Value Ref Range Status   Specimen Description   Final    BLOOD BLOOD RIGHT HAND Performed at [facility], [address] [place], [place] [address]    Special Requests   Final    BOTTLES DRAWN AEROBIC ONLY Blood Culture adequate volume Performed at [facility], [address], [place], [place] [address]    Culture   Final    NO GROWTH 5 DAYS Performed at [facility], [address] [place], [place] [address]    Report Status 08/27/2022 FINAL  Final    Labs: Results for orders placed or performed during the hospital encounter of 08/21/22 (from the past 48 hour(s))  CBC     Status: Abnormal   Collection Time: 08/31/22  7:39 AM  Result Value Ref Range   WBC 8.3 4.0 - 10.5 K/uL   RBC 4.09 3.87 - 5.11 MIL/uL   Hemoglobin 11.7 (L) 12.0 - 15.0 g/dL   HCT 99.8 33.8 - 25.0 %   MCV 88.8 80.0 - 100.0 fL   MCH 28.6 26.0 - 34.0 pg   MCHC 32.2 30.0 - 36.0 g/dL   RDW 53.9 76.7 - 34.1 %   Platelets 355 150 - 400 K/uL   nRBC 0.0 0.0 - 0.2 %    Comment: Performed at [facility], [address], [place], [place] [address]  Basic metabolic  panel     Status: Abnormal   Collection Time: 08/31/22  7:39 AM  Result Value Ref Range   Sodium 136 135 - 145 mmol/L   Potassium 3.0 (L) 3.5 - 5.1 mmol/L   Chloride 103 98 - 111 mmol/L   CO2 24 22 - 32 mmol/L   Glucose, Bld 93 70 - 99 mg/dL    Comment: Glucose reference range applies only to samples taken after fasting for at least 8 hours.   BUN 8 8 - 23 mg/dL   Creatinine, Ser 2.40 0.44 - 1.00 mg/dL   Calcium 8.8 (L) 8.9 - 10.3 mg/dL   GFR, Estimated >97 >35 mL/min    Comment: (NOTE) Calculated using the CKD-EPI Creatinine Equation (2021)  Anion gap 9 5 - 15    Comment: Performed at [facility], [address] [place], [place] [address]  Type and screen     Status: None   Collection Time: 08/31/22  8:52 AM  Result Value Ref Range   ABO/RH(D) O POS    Antibody Screen NEG    Sample Expiration      09/03/2022,2359 Performed at [facility], [address], [place], [place] [address]     Imaging / Studies: No results found.  Medications / Allergies: per chart  Antibiotics: Anti-infectives (From admission, onward)    Start     Dose/Rate Route Frequency Ordered Stop   08/31/22 0600  cefoTEtan (CEFOTAN) 2 g in sodium chloride 0.9 % 100 mL IVPB  Status:  Discontinued        2 g 200 mL/hr over 30 Minutes Intravenous On call to O.R. 08/30/22 1316 08/31/22 1505   08/26/22 1400  ceFEPIme (MAXIPIME) 2 g in sodium chloride 0.9 % 100 mL IVPB        2 g 200 mL/hr over 30 Minutes Intravenous Every 8 hours 08/26/22 0848     08/23/22 0400  cefTRIAXone (ROCEPHIN) 2 g in sodium chloride 0.9 % 100 mL IVPB  Status:  Discontinued        2 g 200 mL/hr over 30 Minutes Intravenous Every 24 hours 08/22/22 0345 08/26/22 0848   08/22/22 1400  metroNIDAZOLE (FLAGYL) IVPB 500 mg        500 mg 100 mL/hr over 60 Minutes Intravenous Every 12 hours 08/22/22 0345     08/22/22 0100  cefTRIAXone (ROCEPHIN) 2 g in sodium chloride 0.9 % 100 mL IVPB        2  g 200 mL/hr over 30 Minutes Intravenous  Once 08/22/22 0047 08/22/22 0226   08/22/22 0100  metroNIDAZOLE (FLAGYL) IVPB 500 mg        500 mg 100 mL/hr over 60 Minutes Intravenous  Once 08/22/22 0047 08/22/22 5400         Note: Portions of this report may have been transcribed using voice recognition software. Every effort was made to ensure accuracy; however, inadvertent computerized transcription errors may be present.   Any transcriptional errors that result from this process are unintentional.  Sheryle Spray, PA-S  @[employer] @  09/01/2022  9:16 AM

## 2022-09-01 NOTE — Consult Note (Signed)
WOC Nurse Consult Note: Consult received for new ostomy and NPWT midline wound care. Will see on Monday with CCS PA-C for first surgical dressing change and ostomy assessment on 09/03/22.  WOC nursing team will follow along with you, and will remain available to this patient, the nursing and medical teams.    Thank you for inviting [place] to participate in this patient's Plan of Care.  Ladona Mow, MSN, RN, CNS, GNP, Leda Min, [employer], [employer] phone:  [phone]

## 2022-09-01 NOTE — Progress Notes (Signed)
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 She was admitted with sigmoid diverticulitis and started on iv antibiotics. General surgery was consulted.  Patient underwent surgical intervention on 08/31/2022.  Assessment & Plan:   Recurrent sigmoid diverticulitis with failed outpatient treatment -Patient has history of recurrent diverticulitis and failed recent outpatient antibiotic point -Repeat CT scan on 08/25/2022 showed persistent inflammation; antibiotics were switched to cefepime and Flagyl. -General surgery following: Underwent exploratory laparotomy, sigmoid colectomy, descending colostomy, VAC dressing placement on 08/31/2022.  Wound care/diet advancement/pain management as per general surgery.  Essential hypertension -Continue amlodipine, continue.  Blood pressure currently stable.  Obesity, class II -Outpatient follow-up  GERD -Continue PPI  Hypokalemia -Improved  Anemia of chronic disease -Possibly from chronic illnesses.  Hemoglobin stable  DVT prophylaxis: Lovenox Code Status: Full Family Communication: None at bedside Disposition Plan: Status is: Inpatient Remains inpatient appropriate because: Of severity of illness    Consultants: General surgery  Procedures:  Exploratory laparotomy, sigmoid colectomy, descending colostomy, VAC dressing placement on 08/31/2022  Antimicrobials:  Anti-infectives (From admission, onward)    Start     Dose/Rate Route Frequency Ordered Stop   08/31/22 0600  cefoTEtan (CEFOTAN) 2 g in sodium chloride 0.9 % 100 mL IVPB  Status:  Discontinued        2 g 200 mL/hr over 30 Minutes Intravenous On call to O.R. 08/30/22 1316 08/31/22  1505   08/26/22 1400  ceFEPIme (MAXIPIME) 2 g in sodium chloride 0.9 % 100 mL IVPB        2 g 200 mL/hr over 30 Minutes Intravenous Every 8 hours 08/26/22 0848     08/23/22 0400  cefTRIAXone (ROCEPHIN) 2 g in sodium chloride 0.9 % 100 mL IVPB  Status:  Discontinued        2 g 200 mL/hr over 30 Minutes Intravenous Every 24 hours 08/22/22 0345 08/26/22 0848   08/22/22 1400  metroNIDAZOLE (FLAGYL) IVPB 500 mg        500 mg 100 mL/hr over 60 Minutes Intravenous Every 12 hours 08/22/22 0345     08/22/22 0100  cefTRIAXone (ROCEPHIN) 2 g in sodium chloride 0.9 % 100 mL IVPB        2 g 200 mL/hr over 30 Minutes Intravenous  Once 08/22/22 0047 08/22/22 0226   08/22/22 0100  metroNIDAZOLE (FLAGYL) IVPB 500 mg        500 mg 100 mL/hr over 60 Minutes Intravenous  Once 08/22/22 0047 08/22/22 0412        Subjective: Patient seen and examined at bedside.  Complains of intermittent abdominal pain.  No fever, vomiting, chest pain reported.   Objective: Vitals:   08/31/22 1503 08/31/22 2205 08/31/22 2228 09/01/22 0531  BP: (!) 148/95 114/77  134/83  Pulse: (!) 120 92 69 95  Resp: 20 16 17 17   Temp: 98.9 F (37.2 C) 98.8 F (37.1 C)  98.6 F (37 C)  TempSrc: Oral Oral  Oral  SpO2: 92% 95% 95% 90%  Weight:      Height:        Intake/Output Summary (Last 24 hours) at 09/01/2022 1031 Last data filed at 09/01/2022 0801 Gross per 24 hour  Intake 3213.95 ml  Output 1865 ml  Net 1348.95  ml    Filed Weights   08/21/22 2220 08/31/22 0815  Weight: 112.5 kg 112 kg    Examination:  General exam: On room air.  No distress. Respiratory system: Decreased breath sounds at bases bilaterally, some scattered crackles  cardiovascular system: Currently rate controlled; S1-S2 heard gastrointestinal system: Abdomen is distended, soft and mildly tender diffusely.  Bowel sounds are sluggish Extremities: Trace lower extremity edema; no clubbing  Data Reviewed: I have personally reviewed following labs and  imaging studies  CBC: Recent Labs  Lab 08/26/22 0354 08/28/22 0332 08/29/22 0657 08/31/22 0739  WBC 8.0 6.5 6.3 8.3  HGB 11.0* 10.8* 11.5* 11.7*  HCT 34.9* 33.2* 36.1 36.3  MCV 89.5 88.5 88.5 88.8  PLT 261 287 313 355    Basic Metabolic Panel: Recent Labs  Lab 08/26/22 0354 08/28/22 0332 08/29/22 0657 08/31/22 0739  NA 137 139 139 136  K 3.4* 3.6 3.7 3.0*  CL 104 107 106 103  CO2 24 23 24 24   GLUCOSE 88 86 92 93  BUN 7* 7* 6* 8  CREATININE 0.74 0.61 0.66 0.70  CALCIUM 8.5* 8.9 8.9 8.8*  MG 2.1 2.0  --   --   PHOS 3.9  --   --   --     GFR: Estimated Creatinine Clearance: 92.3 mL/min (by C-G formula based on SCr of 0.7 mg/dL). Liver Function Tests: No results for input(s): "AST", "ALT", "ALKPHOS", "BILITOT", "PROT", "ALBUMIN" in the last 168 hours.  No results for input(s): "LIPASE", "AMYLASE" in the last 168 hours. No results for input(s): "AMMONIA" in the last 168 hours. Coagulation Profile: No results for input(s): "INR", "PROTIME" in the last 168 hours. Cardiac Enzymes: No results for input(s): "CKTOTAL", "CKMB", "CKMBINDEX", "TROPONINI" in the last 168 hours. BNP (last 3 results) No results for input(s): "PROBNP" in the last 8760 hours. HbA1C: No results for input(s): "HGBA1C" in the last 72 hours. CBG: No results for input(s): "GLUCAP" in the last 168 hours. Lipid Profile: No results for input(s): "CHOL", "HDL", "LDLCALC", "TRIG", "CHOLHDL", "LDLDIRECT" in the last 72 hours. Thyroid Function Tests: No results for input(s): "TSH", "T4TOTAL", "FREET4", "T3FREE", "THYROIDAB" in the last 72 hours. Anemia Panel: No results for input(s): "VITAMINB12", "FOLATE", "FERRITIN", "TIBC", "IRON", "RETICCTPCT" in the last 72 hours. Sepsis Labs: No results for input(s): "PROCALCITON", "LATICACIDVEN" in the last 168 hours.  Recent Results (from the past 240 hour(s))  Culture, blood (Routine X 2) w Reflex to ID Panel     Status: None   Collection Time: 08/22/22  3:58  PM   Specimen: BLOOD  Result Value Ref Range Status   Specimen Description   Final    BLOOD BLOOD RIGHT ARM Performed at [facility], [address] [place], [place]    Special Requests   Final    BOTTLES DRAWN AEROBIC ONLY Blood Culture adequate volume Performed at [facility], [address] [place], [place]    Culture   Final    NO GROWTH 5 DAYS Performed at [facility], [address], [place], [place]    Report Status 08/27/2022 FINAL  Final  Culture, blood (Routine X 2) w Reflex to ID Panel     Status: None   Collection Time: 08/22/22  3:58 PM   Specimen: BLOOD  Result Value Ref Range Status   Specimen Description   Final    BLOOD BLOOD RIGHT HAND Performed at [facility], [address] [place], [place] [address]    Special Requests   Final    BOTTLES DRAWN AEROBIC ONLY Blood Culture adequate volume Performed at [facility], [address], [place], [place] [address]    Culture   Final    NO GROWTH 5 DAYS Performed at [facility], [address] [place], [place] [address]    Report Status 08/27/2022 FINAL  Final         Radiology Studies: No results found.      Scheduled Meds:  acetaminophen  1,000 mg Oral Q6H   amLODipine  10 mg Oral Q supper   cloNIDine  0.1 mg Oral Q supper   enoxaparin (LOVENOX) injection  40 mg Subcutaneous Q24H   gabapentin  200 mg Oral TID   lip balm   Topical BID   multivitamin with minerals  1 tablet Oral Daily   pantoprazole  40 mg Oral Daily   polycarbophil  625 mg Oral BID   saccharomyces boulardii  250 mg Oral BID   sodium chloride flush  3 mL Intravenous Q12H   Continuous Infusions:  sodium chloride 50 mL/hr at 09/01/22 0531   sodium chloride     ceFEPime (MAXIPIME) IV 2 g (09/01/22 0530)   lactated ringers     methocarbamol (ROBAXIN) IV     metronidazole 500 mg (09/01/22 0531)    ondansetron (ZOFRAN) IV            Glade Lloyd, MD [facility] 09/01/2022, 10:31 AM

## 2022-09-01 NOTE — Plan of Care (Signed)
  Problem: Clinical Measurements: Goal: Ability to maintain clinical measurements within normal limits will improve Outcome: Progressing   

## 2022-09-02 ENCOUNTER — Inpatient Hospital Stay (HOSPITAL_COMMUNITY): Payer: Medicare HMO

## 2022-09-02 DIAGNOSIS — D638 Anemia in other chronic diseases classified elsewhere: Secondary | ICD-10-CM | POA: Diagnosis not present

## 2022-09-02 DIAGNOSIS — R509 Fever, unspecified: Secondary | ICD-10-CM | POA: Diagnosis not present

## 2022-09-02 DIAGNOSIS — I10 Essential (primary) hypertension: Secondary | ICD-10-CM | POA: Diagnosis not present

## 2022-09-02 DIAGNOSIS — K5732 Diverticulitis of large intestine without perforation or abscess without bleeding: Secondary | ICD-10-CM | POA: Diagnosis not present

## 2022-09-02 LAB — URINALYSIS, ROUTINE W REFLEX MICROSCOPIC
Bilirubin Urine: NEGATIVE
Glucose, UA: NEGATIVE mg/dL
Ketones, ur: 5 mg/dL — AB
Leukocytes,Ua: NEGATIVE
Nitrite: NEGATIVE
Protein, ur: 100 mg/dL — AB
Specific Gravity, Urine: 1.035 — ABNORMAL HIGH (ref 1.005–1.030)
pH: 5 (ref 5.0–8.0)

## 2022-09-02 MED ORDER — METHOCARBAMOL 1000 MG/10ML IJ SOLN
1000.0000 mg | Freq: Three times a day (TID) | INTRAVENOUS | Status: DC
  Administered 2022-09-02 – 2022-09-03 (×3): 1000 mg via INTRAVENOUS
  Filled 2022-09-02: qty 1000
  Filled 2022-09-02 (×2): qty 10
  Filled 2022-09-02: qty 1000

## 2022-09-02 MED ORDER — GABAPENTIN 300 MG PO CAPS
300.0000 mg | ORAL_CAPSULE | Freq: Three times a day (TID) | ORAL | Status: DC
  Administered 2022-09-02 – 2022-09-18 (×18): 300 mg via ORAL
  Filled 2022-09-02 (×31): qty 1

## 2022-09-02 MED ORDER — SODIUM CHLORIDE 0.9 % IV SOLN
2.0000 g | Freq: Three times a day (TID) | INTRAVENOUS | Status: AC
  Administered 2022-09-02 – 2022-09-04 (×8): 2 g via INTRAVENOUS
  Filled 2022-09-02 (×8): qty 12.5

## 2022-09-02 MED ORDER — ENSURE SURGERY PO LIQD
237.0000 mL | Freq: Two times a day (BID) | ORAL | Status: DC
  Administered 2022-09-02 – 2022-09-07 (×3): 237 mL via ORAL
  Filled 2022-09-02 (×12): qty 237

## 2022-09-02 NOTE — Progress Notes (Signed)
Kristin Gibson [id number] [birth year]  CARE TEAM:  PCP: Woodroe Chen, MD  Outpatient Care Team: Patient Care Team: Woodroe Chen, MD as PCP - General (Internal Medicine) Almond Lint, MD as Consulting Physician (General Surgery)  Inpatient Treatment Team: Treatment Team: Attending Provider: Glade Lloyd, MD; Consulting Physician: Montez Morita, Md, MD; Rounding Team: Shanda Howells, MD; WOC Nurse: Vassie Loll, RN; Physical Therapist: Brien Few, PT; Registered Nurse: Mordecai Rasmussen, RN; Utilization Review: Clydia Llano, RN; Occupational Therapist: Kelli Churn, OT; Occupational Therapist: Jolly Mango, Student-OT   Problem List:   Principal Problem:   Sigmoid diverticulitis Active Problems:   Hypertension   GERD (gastroesophageal reflux disease)   Hypokalemia   Anemia of chronic disease   2 Days Post-Op  08/31/2022  Procedure(s): OPEN PARTIAL COLECTOMY WITH COLOSTOMY    Assessment  Stabilizing, post-operative open partial colectomy with colostomy.  [facility] Stay = 11 days)  Plan: -Patient required straight cath after Foley removal; continue monitoring, consider replacing Foley if requiring second straight cath. -Tolerating full liquid diet, continue advancing.  -Continue ERAS protocol. -Continue antibiotics until 5 days post-op (Cefepime/Flagyl) -F/u pathology. -Pain management improving, maintain current pain management regimen -VTE prophylaxis- SCDs, Lovenox injection -Mobilize as tolerated to help recovery  Disposition:  Per primary service.   I reviewed hospitalist notes, last 24 h vitals and pain scores, last 48 h intake and output, last 24 h labs and trends, and last 24 h imaging results. I have reviewed this patient's available data, including medical history, events of note, test results, etc as part of my evaluation.  A significant portion of that time was spent in counseling.  Care during the described time interval was  provided by me.  This care required moderate level of medical decision making.  09/02/2022    Subjective: (Chief complaint)  Patient seen sitting upright in her chair, eating breakfast. States she is doing better, tolerating full liquid diet without nausea, wants Ensure so that she can up her protein intake. States pain is better managed, notes pain is mostly with movement.  Objective:  Vital signs:  Vitals:   09/01/22 0531 09/01/22 1331 09/01/22 2206 09/02/22 0555  BP: 134/83 125/79 115/75 130/79  Pulse: 95 96 94 89  Resp: 17 (!) 21 20 20   Temp: 98.6 F (37 C) 100 F (37.8 C) 99.1 F (37.3 C) 98.5 F (36.9 C)  TempSrc: Oral Oral Oral Oral  SpO2: 90% 90% 92% 100%  Weight:      Height:        Last BM Date : 09/01/22  Intake/Output   Yesterday:  09/09 0701 - 09/10 0700 In: 1047.5 [P.O.:480; IV Piggyback:567.5] Out: 1550 [Urine:1400; Stool:150] This shift:  No intake/output data recorded.  Bowel function:  Flatus: YES, gas present in colostomy bag  BM:  YES  Drain: (No drain)   Physical Exam:  General: Pt awake/alert in mild acute distress Eyes: PERRL, normal EOM.  Sclera clear.  No icterus Neuro: CN II-XII intact w/o focal sensory/motor deficits. Lymph: No head/neck/groin lymphadenopathy Psych:  No delerium/psychosis/paranoia.  Oriented x 4 HENT: Normocephalic, Mucus membranes moist.  No thrush Neck: Supple, No tracheal deviation.  No obvious thyromegaly Chest: No pain to chest wall compression.  Good respiratory excursion.  No audible wheezing CV:  Pulses intact.  Regular rhythm.  No major extremity edema MS: Normal AROM mjr joints.  No obvious deformity  Abdomen: Soft.  Mildy distended.  Mildly tender at incisions only.  No evidence of peritonitis.  No incarcerated hernias.  Ext:  No deformity.  No mjr edema.  No cyanosis Skin: No petechiae / purpurea.  No major sores.  Warm and dry    Results:   Cultures: Recent Results (from the past 720  hour(s))  Culture, blood (Routine X 2) w Reflex to ID Panel     Status: None   Collection Time: 08/22/22  3:58 PM   Specimen: BLOOD  Result Value Ref Range Status   Specimen Description   Final    BLOOD BLOOD RIGHT ARM Performed at [facility] [address] [place], [place] [address]    Special Requests   Final    BOTTLES DRAWN AEROBIC ONLY Blood Culture adequate volume Performed at [facility], [address] [place], [place] [address]    Culture   Final    NO GROWTH 5 DAYS Performed at [facility], [address], [place], [place] [address]    Report Status 08/27/2022 FINAL  Final  Culture, blood (Routine X 2) w Reflex to ID Panel     Status: None   Collection Time: 08/22/22  3:58 PM   Specimen: BLOOD  Result Value Ref Range Status   Specimen Description   Final    BLOOD BLOOD RIGHT HAND Performed at [facility], [address], [place], [place] [address]    Special Requests   Final    BOTTLES DRAWN AEROBIC ONLY Blood Culture adequate volume Performed at [facility], [address], [place], [place] [address]    Culture   Final    NO GROWTH 5 DAYS Performed at [facility], [address] [place], [place] [address]    Report Status 08/27/2022 FINAL  Final    Labs: No results found for this or any previous visit (from the past 48 hour(s)).  Imaging / Studies: No results found.  Medications / Allergies: per chart  Antibiotics: Anti-infectives (From admission, onward)    Start     Dose/Rate Route Frequency Ordered Stop   08/31/22 0600  cefoTEtan (CEFOTAN) 2 g in sodium chloride 0.9 % 100 mL IVPB  Status:  Discontinued        2 g 200 mL/hr over 30 Minutes Intravenous On call to O.R. 08/30/22 1316 08/31/22 1505   08/26/22 1400  ceFEPIme (MAXIPIME) 2 g in sodium chloride 0.9 % 100 mL IVPB        2 g 200 mL/hr over 30 Minutes Intravenous Every 8 hours 08/26/22 0848  09/05/22 2359   08/23/22 0400  cefTRIAXone (ROCEPHIN) 2 g in sodium chloride 0.9 % 100 mL IVPB  Status:  Discontinued        2 g 200 mL/hr over 30 Minutes Intravenous Every 24 hours 08/22/22 0345 08/26/22 0848   08/22/22 1400  metroNIDAZOLE (FLAGYL) IVPB 500 mg        500 mg 100 mL/hr over 60 Minutes Intravenous Every 12 hours 08/22/22 0345     08/22/22 0100  cefTRIAXone (ROCEPHIN) 2 g in sodium chloride 0.9 % 100 mL IVPB        2 g 200 mL/hr over 30 Minutes Intravenous  Once 08/22/22 0047 08/22/22 0226   08/22/22 0100  metroNIDAZOLE (FLAGYL) IVPB 500 mg        500 mg 100 mL/hr over 60 Minutes Intravenous  Once 08/22/22 0047 08/22/22 0412         Note: Portions of this report may have been transcribed using voice  recognition software. Every effort was made to ensure accuracy; however, inadvertent computerized transcription errors may be present.   Any transcriptional errors that result from this process are unintentional.  Sheryle Spray, PA-S  @[email or web address] @  09/02/2022  8:54 AM

## 2022-09-02 NOTE — Progress Notes (Signed)
Pt called out at 0430 saying she felt the urge to urinate and wanted to get on the bedside commode. Pt unable to void and was bladder scanned. Bladder scan showed . Paged Dr. Antionette Char for orders to in and out cath patient.

## 2022-09-02 NOTE — Progress Notes (Signed)
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 She was admitted with sigmoid diverticulitis and started on iv antibiotics. General surgery was consulted.  Patient underwent surgical intervention on 08/31/2022.  Assessment & Plan:   Recurrent sigmoid diverticulitis with failed outpatient treatment -Patient has history of recurrent diverticulitis and failed recent outpatient antibiotic point -Repeat CT scan on 08/25/2022 showed persistent inflammation; antibiotics were switched to cefepime and Flagyl. -General surgery following: Underwent exploratory laparotomy, sigmoid colectomy, descending colostomy, VAC dressing placement on 08/31/2022.  Antibiotic duration/wound care/diet advancement/pain management as per general surgery.  Essential hypertension -Continue amlodipine, continue.  Blood pressure currently stable.  Obesity, class II -Outpatient follow-up  GERD -Continue PPI  Hypokalemia -No new labs today  Anemia of chronic disease -Possibly from chronic illnesses.  Hemoglobin stable  DVT prophylaxis: Lovenox Code Status: Full Family Communication: None at bedside Disposition Plan: Status is: Inpatient Remains inpatient appropriate because: Of severity of illness    Consultants: General surgery  Procedures:  Exploratory laparotomy, sigmoid colectomy, descending colostomy, VAC dressing placement on 08/31/2022  Antimicrobials:  Anti-infectives (From admission, onward)    Start     Dose/Rate Route Frequency Ordered Stop   08/31/22 0600  cefoTEtan (CEFOTAN) 2 g in sodium chloride 0.9 % 100 mL IVPB  Status:  Discontinued        2 g 200 mL/hr over 30 Minutes Intravenous On call to  O.R. 08/30/22 1316 08/31/22 1505   08/26/22 1400  ceFEPIme (MAXIPIME) 2 g in sodium chloride 0.9 % 100 mL IVPB        2 g 200 mL/hr over 30 Minutes Intravenous Every 8 hours 08/26/22 0848 09/05/22 2359   08/23/22 0400  cefTRIAXone (ROCEPHIN) 2 g in sodium chloride 0.9 % 100 mL IVPB  Status:  Discontinued        2 g 200 mL/hr over 30 Minutes Intravenous Every 24 hours 08/22/22 0345 08/26/22 0848   08/22/22 1400  metroNIDAZOLE (FLAGYL) IVPB 500 mg        500 mg 100 mL/hr over 60 Minutes Intravenous Every 12 hours 08/22/22 0345     08/22/22 0100  cefTRIAXone (ROCEPHIN) 2 g in sodium chloride 0.9 % 100 mL IVPB        2 g 200 mL/hr over 30 Minutes Intravenous  Once 08/22/22 0047 08/22/22 0226   08/22/22 0100  metroNIDAZOLE (FLAGYL) IVPB 500 mg        500 mg 100 mL/hr over 60 Minutes Intravenous  Once 08/22/22 0047 08/22/22 0412        Subjective: Patient seen and examined at bedside.  Denies any fever, vomiting, chest pain.  Nursing staff reports that patient had in and out bladder catheterization earlier this morning.   Objective: Vitals:   09/01/22 0531 09/01/22 1331 09/01/22 2206 09/02/22 0555  BP: 134/83 125/79 115/75 130/79  Pulse: 95 96 94 89  Resp: 17 (!) 21 20 20   Temp: 98.6 F (37 C) 100 F (37.8 C) 99.1 F (37.3 C) 98.5 F (36.9 C)  TempSrc: Oral Oral Oral Oral  SpO2: 90% 90% 92% 100%  Weight:      Height:        Intake/Output Summary (Last 24 hours) at 09/02/2022 0738 Last data filed at 09/02/2022 [phone]  Gross per 24 hour  Intake 1047.52 ml  Output 1550 ml  Net -502.48 ml    Filed Weights   08/21/22 2220 08/31/22 0815  Weight: 112.5 kg 112 kg    Examination:  General exam: No acute distress currently.  On room air. Respiratory system: Bilateral decreased breath sounds at bases with scattered crackles cardiovascular system: S1-S2 heard; rate controlled currently gastrointestinal system: Abdomen is obese, still distended; soft and mildly tender diffusely.   Sluggish bowel sounds  extremities: No cyanosis; mild lower extremity edema present  Data Reviewed: I have personally reviewed following labs and imaging studies  CBC: Recent Labs  Lab 08/28/22 0332 08/29/22 0657 08/31/22 0739  WBC 6.5 6.3 8.3  HGB 10.8* 11.5* 11.7*  HCT 33.2* 36.1 36.3  MCV 88.5 88.5 88.8  PLT 287 313 355    Basic Metabolic Panel: Recent Labs  Lab 08/28/22 0332 08/29/22 0657 08/31/22 0739  NA 139 139 136  K 3.6 3.7 3.0*  CL 107 106 103  CO2 23 24 24   GLUCOSE 86 92 93  BUN 7* 6* 8  CREATININE 0.61 0.66 0.70  CALCIUM 8.9 8.9 8.8*  MG 2.0  --   --     GFR: Estimated Creatinine Clearance: 92.3 mL/min (by C-G formula based on SCr of 0.7 mg/dL). Liver Function Tests: No results for input(s): "AST", "ALT", "ALKPHOS", "BILITOT", "PROT", "ALBUMIN" in the last 168 hours.  No results for input(s): "LIPASE", "AMYLASE" in the last 168 hours. No results for input(s): "AMMONIA" in the last 168 hours. Coagulation Profile: No results for input(s): "INR", "PROTIME" in the last 168 hours. Cardiac Enzymes: No results for input(s): "CKTOTAL", "CKMB", "CKMBINDEX", "TROPONINI" in the last 168 hours. BNP (last 3 results) No results for input(s): "PROBNP" in the last 8760 hours. HbA1C: No results for input(s): "HGBA1C" in the last 72 hours. CBG: No results for input(s): "GLUCAP" in the last 168 hours. Lipid Profile: No results for input(s): "CHOL", "HDL", "LDLCALC", "TRIG", "CHOLHDL", "LDLDIRECT" in the last 72 hours. Thyroid Function Tests: No results for input(s): "TSH", "T4TOTAL", "FREET4", "T3FREE", "THYROIDAB" in the last 72 hours. Anemia Panel: No results for input(s): "VITAMINB12", "FOLATE", "FERRITIN", "TIBC", "IRON", "RETICCTPCT" in the last 72 hours. Sepsis Labs: No results for input(s): "PROCALCITON", "LATICACIDVEN" in the last 168 hours.  No results found for this or any previous visit (from the past 240 hour(s)).        Radiology Studies: No  results found.      Scheduled Meds:  acetaminophen  1,000 mg Oral Q6H   amLODipine  10 mg Oral Q supper   cloNIDine  0.1 mg Oral Q supper   enoxaparin (LOVENOX) injection  40 mg Subcutaneous Q24H   gabapentin  200 mg Oral TID   lip balm   Topical BID   multivitamin with minerals  1 tablet Oral Daily   pantoprazole  40 mg Oral Daily   polycarbophil  625 mg Oral BID   saccharomyces boulardii  250 mg Oral BID   sodium chloride flush  3 mL Intravenous Q12H   Continuous Infusions:  sodium chloride     ceFEPime (MAXIPIME) IV Stopped (09/02/22 11/02/22)   lactated ringers     methocarbamol (ROBAXIN) IV 500 mg (09/02/22 11/02/22)   metronidazole 500 mg (09/02/22 0624)   ondansetron (ZOFRAN) IV            11/02/22, MD [facility] 09/02/2022, 7:38 AM

## 2022-09-02 NOTE — Evaluation (Signed)
Physical Therapy Evaluation Patient Details Name: Kristin Gibson MRN: [id number] DOB: March 30, 1956 Today's Date: 09/02/2022  History of Present Illness  66 year old female with history of gastric sleeve surgery for weight loss February 2093, recurrent diverticulitis, recently completed 2 rounds of antibiotics presented with worsening abdominal pain with fever and chills. She was admitted with sigmoid diverticulitis and started on iv antibiotics. General surgery was consulted.  Patient underwent surgical intervention on 08/31/2022.  Clinical Impression  Pt admitted as above and presenting with functional mobility limitations 2* decreased LE strength and post op pain.  Pt should progress to dc home with family assist.     Recommendations for follow up therapy are one component of a multi-disciplinary discharge planning process, led by the attending physician.  Recommendations may be updated based on patient status, additional functional criteria and insurance authorization.  Follow Up Recommendations No PT follow up      Assistance Recommended at Discharge Intermittent Supervision/Assistance  Patient can return home with the following  A little help with walking and/or transfers;A little help with bathing/dressing/bathroom;Assistance with cooking/housework;Assist for transportation;Help with stairs or ramp for entrance    Equipment Recommendations None recommended by PT  Recommendations for Other Services       Functional Status Assessment Patient has had a recent decline in their functional status and demonstrates the ability to make significant improvements in function in a reasonable and predictable amount of time.     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  Physical assist to bring wt up and fwd from recliner    Ambulation/Gait Ambulation/Gait assistance: Min assist Gait Distance (Feet): 180 Feet Assistive device: Rolling walker (2 wheels) Gait Pattern/deviations: Step-through pattern, Decreased step length - right, Decreased step length - left, Shuffle, Trunk flexed Gait velocity: decr     General Gait Details: cues for posture, position from RW; standing rest breaks for task completion  Stairs            Wheelchair Mobility    Modified Rankin (Stroke Patients Only)       Balance                                             Pertinent Vitals/Pain Pain Assessment Pain Assessment: 0-10 Pain Score: 4  Pain Location: abdomen Pain Descriptors / Indicators: Aching, Sore Pain Intervention(s): Limited activity within patient's tolerance, Monitored during session, Premedicated before session    Home Living Family/patient expects to be discharged to:: Private residence Living Arrangements: Alone Available Help at Discharge: Family;Available PRN/intermittently Type of Home: House Home Access: Stairs to enter   Entrance Stairs-Number of Steps: 1   Home Layout: One level Home Equipment: Agricultural consultant (2 wheels)  Prior Function Prior Level of Function : Independent/Modified Independent                     Hand Dominance   Dominant Hand: Right    Extremity/Trunk Assessment   Upper Extremity Assessment Upper Extremity Assessment: Overall WFL for tasks assessed    Lower  Extremity Assessment Lower Extremity Assessment: Generalized weakness    Cervical / Trunk Assessment Cervical / Trunk Assessment: Normal  Communication   Communication: No difficulties  Cognition Arousal/Alertness: Awake/alert Behavior During Therapy: WFL for tasks assessed/performed Overall Cognitive Status: Within Functional Limits for tasks assessed                                          General Comments      Exercises     Assessment/Plan    PT Assessment Patient needs continued PT services  PT Problem List Decreased strength;Decreased activity tolerance;Decreased balance;Decreased mobility;Decreased knowledge of use of DME;Obesity;Pain       PT Treatment Interventions DME instruction;Gait training;Functional mobility training;Therapeutic activities;Therapeutic exercise;Balance training;Patient/family education    PT Goals (Current goals can be found in the Care Plan section)  Acute Rehab PT Goals Patient Stated Goal: Regain IND PT Goal Formulation: With patient Time For Goal Achievement: 09/16/22 Potential to Achieve Goals: Good    Frequency Min 3X/week     Co-evaluation               AM-PAC PT "6 Clicks" Mobility  Outcome Measure Help needed turning from your back to your side while in a flat bed without using bedrails?: A Lot Help needed moving from lying on your back to sitting on the side of a flat bed without using bedrails?: A Lot Help needed moving to and from a bed to a chair (including a wheelchair)?: A Lot Help needed standing up from a chair using your arms (e.g., wheelchair or bedside chair)?: A Lot Help needed to walk in hospital room?: A Little Help needed climbing 3-5 steps with a railing? : A Lot 6 Click Score: 13    End of Session Equipment Utilized During Treatment: Gait belt Activity Tolerance: Patient tolerated treatment well Patient left: in bed;with call bell/phone within reach;with bed alarm set Nurse  Communication: Mobility status PT Visit Diagnosis: Difficulty in walking, not elsewhere classified (R26.2)    Time: 1006-1030 PT Time Calculation (min) (ACUTE ONLY): 24 min   Charges:   PT Evaluation $PT Eval Low Complexity: 1 Low PT Treatments $Gait Training: 8-22 mins        Mauro Kaufmann PT Acute Rehabilitation Services Pager [phone] Office [phone]   Matheo Rathbone 09/02/2022, 12:54 PM

## 2022-09-02 NOTE — Progress Notes (Signed)
Performed in/out cath and was able to drain from patient bladder. Per orders will replace foley catheter if patient requires second in/out cath, will pass along to day shift nurse.

## 2022-09-02 NOTE — Evaluation (Signed)
Occupational Therapy Evaluation Patient Details Name: SOLSTICE LASTINGER MRN: [id number] DOB: 04-23-1956 Today's Date: 09/02/2022   History of Present Illness 66 year old female with history of gastric sleeve surgery for weight loss February 2093, recurrent diverticulitis, recently completed 2 rounds of antibiotics presented with worsening abdominal pain with fever and chills. She was admitted with sigmoid diverticulitis and started on iv antibiotics. General surgery was consulted.  Patient underwent surgical intervention on 08/31/2022.   Clinical Impression   Mrs. Mackenna Kamer is a 66 year old woman typically independent and volunteers at her church. On evaluation she presents with generalized weakness, decreased activity tolerance and pain. She has incisional wound vac and colostomy now. Patient abel to stand and transfer to recliner with walker and needing significant assistance for ADLs. Patient will benefit from skilled OT services while in hospital to improve deficits and learn compensatory strategies as needed in order to return to PLOF.  Patient found on 2 L [place] but maintained at 93% on RA. Discharge plan is to go to daughter's house.      Recommendations for follow up therapy are one component of a multi-disciplinary discharge planning process, led by the attending physician.  Recommendations may be updated based on patient status, additional functional criteria and insurance authorization.   Follow Up Recommendations  Home health OT    Assistance Recommended at Discharge Intermittent Supervision/Assistance  Patient can return home with the following A little help with bathing/dressing/bathroom;Assistance with cooking/housework;Help with stairs or ramp for entrance    Functional Status Assessment  Patient has had a recent decline in their functional status and demonstrates the ability to make significant improvements in function in a reasonable and predictable amount of time.   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 Restrictions Weight Bearing Restrictions: No      Mobility Bed Mobility                    Transfers                          Balance Overall balance assessment: Needs assistance Sitting-balance support: No upper extremity supported, Feet supported Sitting balance-Leahy Scale: Good     Standing balance support: During functional activity Standing balance-Leahy Scale: Fair                             ADL either performed or assessed with clinical judgement   ADL Overall ADL's : Needs assistance/impaired Eating/Feeding: Independent   Grooming: Sitting;Set up   Upper Body Bathing: Set up;Sitting   Lower Body Bathing: Sit to/from stand;Maximal assistance   Upper Body Dressing : Sitting;Set up   Lower Body Dressing: Maximal assistance;Sit to/from stand Lower Body Dressing Details (indicate cue type and reason): Educated on need for AE to improve independence. Toilet Transfer: Chemical engineer (2 wheels)   Toileting- Clothing Manipulation and Hygiene: Maximal assistance;Sit to/from stand       Functional mobility during ADLs: Min guard;Rolling walker (2 wheels) General ADL Comments: Functional mobility limited to standing and taking steps at side of bed to chair with min guard. Instructed on use of log roll for bed mobility. Increased time needed due to pain.     Vision Patient Visual Report: No change from baseline  Perception     Praxis      Pertinent Vitals/Pain Pain Assessment Pain Assessment: Faces Faces Pain Scale: Hurts little more Pain Location: abdomen Pain Descriptors / Indicators: Aching, Guarding Pain Intervention(s): Limited activity within patient's tolerance, Premedicated before session      Hand Dominance Right   Extremity/Trunk Assessment Upper Extremity Assessment Upper Extremity Assessment: Overall WFL for tasks assessed   Lower Extremity Assessment Lower Extremity Assessment: Defer to PT evaluation   Cervical / Trunk Assessment Cervical / Trunk Assessment: Normal   Communication Communication Communication: No difficulties   Cognition Arousal/Alertness: Awake/alert Behavior During Therapy: WFL for tasks assessed/performed Overall Cognitive Status: Within Functional Limits for tasks assessed                                       General Comments  Patient foundon 2 L . Maintained oxygen at 93% on RA. Encouarged IS.    Exercises     Shoulder Instructions      Home Living Family/patient expects to be discharged to:: Private residence Living Arrangements: Alone (going to stay with daughter at discharge) Available Help at Discharge: Family;Available PRN/intermittently Type of Home: House Home Access: Stairs to enter [employer] of Steps: 2   Home Layout: One level     Bathroom Shower/Tub: Walk-in shower;Tub/shower unit         Home Equipment: Agricultural consultant (2 wheels)          Prior Functioning/Environment Prior Level of Function : Independent/Modified Independent                        OT Problem List: Decreased strength;Decreased activity tolerance;Impaired balance (sitting and/or standing);Decreased knowledge of use of DME or AE;Decreased knowledge of precautions;Pain;Obesity      OT Treatment/Interventions: Self-care/ADL training;DME and/or AE instruction;Therapeutic activities;Balance training;Patient/family education    OT Goals(Current goals can be found in the care plan section) Acute Rehab OT Goals Patient Stated Goal: less pain and do more OT Goal Formulation: With patient Time For Goal Achievement: 09/16/22 Potential to Achieve Goals: Good  OT Frequency: Min 2X/week    Co-evaluation               AM-PAC OT "6 Clicks" Daily Activity     Outcome Measure Help from another person eating meals?: None Help from another person taking care of personal grooming?: A Little Help from another person toileting, which includes using toliet, bedpan, or urinal?: A Lot Help from another person bathing (including washing, rinsing, drying)?: A Lot Help from another person to put on and taking off regular upper body clothing?: A Little Help from another person to put on and taking off regular lower body clothing?: A Lot 6 Click Score: 16   End of Session Equipment Utilized During Treatment: Rolling walker (2 wheels);Gait belt Nurse Communication: Mobility status  Activity Tolerance: Patient limited by pain Patient left: in chair;with call bell/phone within reach;with chair alarm set  OT Visit Diagnosis: Pain;Muscle weakness (generalized) (M62.81)                Time: 0938-1829 OT Time Calculation (min): 27 min Charges:  OT General Charges $OT Visit: 1 Visit OT Evaluation $OT Eval Low Complexity: 1 Low OT Treatments $Self Care/Home Management : 8-22 mins  Donnella Sham, OTR/L Acute Care Rehab Services  Office [phone]   Kelli Churn 09/02/2022, 12:03 PM

## 2022-09-03 ENCOUNTER — Encounter (HOSPITAL_COMMUNITY): Payer: Self-pay | Admitting: Surgery

## 2022-09-03 DIAGNOSIS — D638 Anemia in other chronic diseases classified elsewhere: Secondary | ICD-10-CM | POA: Diagnosis not present

## 2022-09-03 DIAGNOSIS — K5732 Diverticulitis of large intestine without perforation or abscess without bleeding: Secondary | ICD-10-CM | POA: Diagnosis not present

## 2022-09-03 DIAGNOSIS — I10 Essential (primary) hypertension: Secondary | ICD-10-CM | POA: Diagnosis not present

## 2022-09-03 LAB — BASIC METABOLIC PANEL
Anion gap: 6 (ref 5–15)
BUN: 16 mg/dL (ref 8–23)
CO2: 23 mmol/L (ref 22–32)
Calcium: 8.8 mg/dL — ABNORMAL LOW (ref 8.9–10.3)
Chloride: 109 mmol/L (ref 98–111)
Creatinine, Ser: 0.86 mg/dL (ref 0.44–1.00)
GFR, Estimated: >60 mL/min (ref >60)
Glucose, Bld: 110 mg/dL — ABNORMAL HIGH (ref 70–99)
Potassium: 3.2 mmol/L — ABNORMAL LOW (ref 3.5–5.1)
Sodium: 138 mmol/L (ref 135–145)

## 2022-09-03 LAB — CBC WITH DIFFERENTIAL/PLATELET
Abs Immature Granulocytes: 0.17 10*3/uL — ABNORMAL HIGH (ref 0.00–0.07)
Basophils Absolute: 0 10*3/uL (ref 0.0–0.1)
Basophils Relative: 0 %
Eosinophils Absolute: 0.2 10*3/uL (ref 0.0–0.5)
Eosinophils Relative: 2 %
HCT: 32.7 % — ABNORMAL LOW (ref 36.0–46.0)
Hemoglobin: 10.2 g/dL — ABNORMAL LOW (ref 12.0–15.0)
Immature Granulocytes: 1 %
Lymphocytes Relative: 6 %
Lymphs Abs: 0.9 10*3/uL (ref 0.7–4.0)
MCH: 28.1 pg (ref 26.0–34.0)
MCHC: 31.2 g/dL (ref 30.0–36.0)
MCV: 90.1 fL (ref 80.0–100.0)
Monocytes Absolute: 0.6 10*3/uL (ref 0.1–1.0)
Monocytes Relative: 4 %
Neutro Abs: 13.5 10*3/uL — ABNORMAL HIGH (ref 1.7–7.7)
Neutrophils Relative %: 87 %
Platelets: 336 10*3/uL (ref 150–400)
RBC: 3.63 MIL/uL — ABNORMAL LOW (ref 3.87–5.11)
RDW: 15.1 % (ref 11.5–15.5)
WBC: 15.5 10*3/uL — ABNORMAL HIGH (ref 4.0–10.5)
nRBC: 0 % (ref 0.0–0.2)

## 2022-09-03 LAB — MAGNESIUM: Magnesium: 2.1 mg/dL (ref 1.7–2.4)

## 2022-09-03 LAB — SURGICAL PATHOLOGY

## 2022-09-03 MED ORDER — KCL IN DEXTROSE-NACL 20-5-0.45 MEQ/L-%-% IV SOLN
INTRAVENOUS | Status: AC
  Filled 2022-09-03 (×9): qty 1000

## 2022-09-03 MED ORDER — POTASSIUM CHLORIDE 20 MEQ PO PACK
60.0000 meq | PACK | Freq: Once | ORAL | Status: AC
Start: 2022-09-03 — End: 2022-09-03
  Administered 2022-09-03: 60 meq via ORAL
  Filled 2022-09-03: qty 3

## 2022-09-03 MED ORDER — METHOCARBAMOL 1000 MG/10ML IJ SOLN
1000.0000 mg | Freq: Three times a day (TID) | INTRAVENOUS | Status: DC
  Administered 2022-09-03 – 2022-09-19 (×47): 1000 mg via INTRAVENOUS
  Filled 2022-09-03 (×34): qty 1000
  Filled 2022-09-03: qty 10
  Filled 2022-09-03 (×7): qty 1000
  Filled 2022-09-03 (×2): qty 10
  Filled 2022-09-03 (×4): qty 1000

## 2022-09-03 MED ORDER — PROSOURCE PLUS PO LIQD: 30.0000 mL | Freq: Two times a day (BID) | ORAL | Status: DC

## 2022-09-03 MED ORDER — ADVANCED MULTI EA PO CHEW
2.0000 | CHEWABLE_TABLET | Freq: Every day | ORAL | Status: DC
  Filled 2022-09-03 (×4): qty 2

## 2022-09-03 NOTE — Consult Note (Signed)
[facility] Nurse Consult Note: Reason for Consult: First post op NPWT dressing change. [facility] PA-C L. Simaan is present for the dressing change and provides assessment, support, education for patient and family as well as photodocumentation of wound and ostomy. Wound type:Surgical Pressure Injury POA: N/A Measurement: 15cm x 5cm x 3cm Wound bed: 100% red, moist Drainage (amount, consistency, odor) small to moderate serous exudate Periwound: intact, mild maceration at umbilicus at 3 o'clock. Dressing procedure/placement/frequency: Patient is premedicated with Dilaudid prior to dressing change. One piece of back foam removed with drape and ostomy pouching system. Wound cleansed and gently patted dry. Pieces of skin barrier ring are used to enhance seal at 3 o'clock (umbilicus) and the most distal margin of wound. One piece of black foam is used to fill dead space and is secured with drape.The dressing is attached to 119mmHg continuous negative pressure and an immediate seal is achieved. The dressing is labeled with the number and type of wound contact layers and today's measurements.  One NPWT dressing kit is left in the room for next dressing change.  Next dressing change is Wednesday, 09/05/22.  [facility] Nurse ostomy consult note Stoma type/location: LLQ colostomy Stomal assessment/size: 1 and 5/8 inches round, medial portion is raised and lateral edge is flush with abdomen. Lumen in center. Peristomal assessment:  Treatment options for stomal/peristomal skin: 1 and 1/2 skin barrier rings, one encircles stoma and the other half is placed from 12-6 o'clock Output : scant light brown effluent in pouch and emerging from stoma during pouch change. Ostomy pouching: 2pc. 2 and 3/4 inch pouching system with 1 and 1/2 skin barrier rings Education provided: Patient and daughter are provided education regarding NPWT today and questions about stoma answered as to coloring of mucosa. Lock and Roll closure demonstrated  today. Enrolled patient in [place] program: No   WOC nursing team will not follow, but will remain available to this patient, the nursing and medical teams.  Please re-consult if needed.  Thank you for inviting [place] to participate in this patient's Plan of Care.  Maudie Flakes, MSN, RN, CNS, [place], Serita Grammes, [employer], [employer] phone:  [phone]

## 2022-09-03 NOTE — TOC Initial Note (Addendum)
Transition of Care [facility]) - Initial/Assessment Note    Patient Details  Name: Kristin Gibson MRN: [id number] Date of Birth: 10-06-1956  [facility]) CM/SW Contact:    Lavenia Atlas, RN Phone Number: 09/03/2022, 3:38 PM  Clinical Narrative:   Patient is a new ostomy, will need HHRN orders. Contacted Cory with [facility] who will follow for [facility] needs. Notified Tracey with [facility] who is following for discharge for NPWT.  Pharmacy: [facility] on [address] Transportation at discharge: daughter will transport and patient will reside at daughters home at discharge.              TOC will continue to follow  Expected Discharge Plan: Home w Home Health Services Barriers to Discharge: Continued Medical Work up   Patient Goals and CMS Choice Patient states their goals for this hospitalization and ongoing recovery are:: return home with home health services CMS Medicare.gov Compare Post Acute Care list provided to:: Patient Choice offered to / list presented to : Patient  Expected Discharge Plan and Services Expected Discharge Plan: Home w Home Health Services In-house Referral: NA Discharge Planning Services: CM Consult Post Acute Care Choice: Home Health [facility]) Living arrangements for the past 2 months: Apartment                 DME Arranged: Other see comment, Negative pressure wound device (raised commode seat) DME Agency: [facility], [employer] Date DME Agency Contacted: 09/03/22 Time DME Agency Contacted: [phone] Representative spoke with at DME Agency: [facility] HH Arranged: RN          Prior Living Arrangements/Services Living arrangements for the past 2 months: Apartment Lives with:: Self Patient language and need for interpreter reviewed:: Yes Do you feel safe going back to the place where you live?: Yes      Need for Family Participation in Patient Care: Yes (Comment) Care giver support system in place?: Yes (comment) Current home services: DME (cane, walker, bedside  commode) Criminal Activity/Legal Involvement Pertinent to Current Situation/Hospitalization: No - Comment as needed  Activities of Daily Living Home Assistive Devices/Equipment: Eyeglasses ADL Screening (condition at time of admission) Patient's cognitive ability adequate to safely complete daily activities?: Yes Is the patient deaf or have difficulty hearing?: No Does the patient have difficulty seeing, even when wearing glasses/contacts?: No Does the patient have difficulty concentrating, remembering, or making decisions?: No Patient able to express need for assistance with ADLs?: Yes Does the patient have difficulty dressing or bathing?: No Independently performs ADLs?: Yes (appropriate for developmental age) Does the patient have difficulty walking or climbing stairs?: No Weakness of Legs: None Weakness of Arms/Hands: None  Permission Sought/Granted Permission sought to share information with : Case Manager Permission granted to share information with : Yes, Verbal Permission Granted  Share Information with NAME: Case Manager           Emotional Assessment Appearance:: Appears stated age Attitude/Demeanor/Rapport: Gracious Affect (typically observed): Accepting Orientation: : Oriented to Self, Oriented to Place, Oriented to  Time, Oriented to Situation Alcohol / Substance Use: Not Applicable Psych Involvement: No (comment)  Admission diagnosis:  Sigmoid diverticulitis [K57.32] Diverticulitis of colon without hemorrhage [K57.32] Normochromic normocytic anemia [D64.9] Elevated blood pressure reading with diagnosis of hypertension [I10] Patient Active Problem List   Diagnosis Date Noted   Anemia of chronic disease 08/29/2022   Sigmoid diverticulitis 08/22/2022   Morbid obesity (HCC) 02/19/2022   Colon polyp 09/26/2017   Hemorrhoids 09/26/2017   Hypokalemia 07/29/2017   Major depressive disorder  with single episode, in full remission (HCC) 07/29/2017   Constipation  02/25/2017   Diverticulitis of large intestine 02/25/2017   Lower abdominal pain 02/25/2017   GERD (gastroesophageal reflux disease) 01/15/2017   Morbid obesity with BMI of 40.0-44.9, adult (HCC) 01/15/2017   OAB (overactive bladder) 01/15/2017   OSA (obstructive sleep apnea) 12/04/2016   Primary osteoarthritis of both first carpometacarpal joints 11/20/2016   Primary osteoarthritis of left knee 11/20/2016   Primary osteoarthritis of right knee 11/20/2016   Chronic pain of both knees 11/05/2016   Edema leg 11/05/2016   Hypertension 02/19/2008   ARTHRITIS 02/19/2008   LOW BACK PAIN 02/19/2008   PCP:  Woodroe Chen, MD Pharmacy:   [facility] [phone] - [place], [place] - [address] AT [facility] [address] [place] [address] Phone: [phone] Fax: [phone]     Social Determinants of Health (SDOH) Interventions    Readmission Risk Interventions     No data to display

## 2022-09-03 NOTE — Progress Notes (Signed)
PT Cancellation Note  Patient Details Name: Kristin Gibson MRN: [id number] DOB: 1956/03/26   Cancelled Treatment:    Reason Eval/Treat Not Completed: Other (comment) Family in room. Pt reports having nausea and vomiting today and declined participating.  Pt encouraged to move LEs in bed and perform ankle pumps as tolerated.  Pt agreeable and requests PT to check back tomorrow to mobilize OOB.  Will check back as schedule permits.   [name] 09/03/2022, 4:33 PM Thomasene Mohair PT, DPT Physical Therapist Acute Rehabilitation Services Preferred contact method: Secure Chat Weekend Pager Only: [phone] Office: [phone]

## 2022-09-03 NOTE — Progress Notes (Addendum)
Subjective Tmax 38.6 yesterday, none overnight. No significant output from her colostomy yet - although this morning there does appear to be a small particulate/stool in appliance which is encouraging. She has had some issues with nausea with attempted intake of liquids and does not feel like she is ready for solids yet. Pain is controlled. She is in good spirits.   Her daughter Calen Posch is present via speaker phone during rounds.  Objective: Vital signs in last 24 hours: Temp:  [98 F (36.7 C)-101.5 F (38.6 C)] 98.7 F (37.1 C) (09/11 0618) Pulse Rate:  [78-94] 78 (09/11 0618) Resp:  [14-20] 16 (09/11 0618) BP: (95-128)/(58-78) 111/70 (09/11 0618) SpO2:  [88 %-100 %] 100 % (09/11 0618) Last BM Date : 09/01/22  Intake/Output from previous day: 09/10 0701 - 09/11 0700 In: 66.8 [IV Piggyback:66.8] Out: 945 [Urine:925; Stool:20] Intake/Output this shift: No intake/output data recorded.  AF, normal vital signs  Gen: NAD, comfortable CV: RRR Pulm: Normal work of breathing Abd: Soft, wound VAC in place with clean edges; scant serosanguinous effluent in canister. Ostomy light pink and productive of 'sweat' and small volume particulate in appliance. Ext: SCDs in place  Lab Results: CBC  No results for input(s): "WBC", "HGB", "HCT", "PLT" in the last 72 hours. BMET No results for input(s): "NA", "K", "CL", "CO2", "GLUCOSE", "BUN", "CREATININE", "CALCIUM" in the last 72 hours. PT/INR No results for input(s): "LABPROT", "INR" in the last 72 hours. ABG No results for input(s): "PHART", "HCO3" in the last 72 hours.  Invalid input(s): "PCO2", "PO2"  Studies/Results:  Anti-infectives: Anti-infectives (From admission, onward)    Start     Dose/Rate Route Frequency Ordered Stop   09/02/22 1400  ceFEPIme (MAXIPIME) 2 g in sodium chloride 0.9 % 100 mL IVPB        2 g 200 mL/hr over 30 Minutes Intravenous Every 8 hours 09/02/22 1015 09/04/22 2359   08/31/22 0600   cefoTEtan (CEFOTAN) 2 g in sodium chloride 0.9 % 100 mL IVPB  Status:  Discontinued        2 g 200 mL/hr over 30 Minutes Intravenous On call to O.R. 08/30/22 1316 08/31/22 1505   08/26/22 1400  ceFEPIme (MAXIPIME) 2 g in sodium chloride 0.9 % 100 mL IVPB  Status:  Discontinued        2 g 200 mL/hr over 30 Minutes Intravenous Every 8 hours 08/26/22 0848 09/02/22 1015   08/23/22 0400  cefTRIAXone (ROCEPHIN) 2 g in sodium chloride 0.9 % 100 mL IVPB  Status:  Discontinued        2 g 200 mL/hr over 30 Minutes Intravenous Every 24 hours 08/22/22 0345 08/26/22 0848   08/22/22 1400  metroNIDAZOLE (FLAGYL) IVPB 500 mg        500 mg 100 mL/hr over 60 Minutes Intravenous Every 12 hours 08/22/22 0345 09/04/22 2359   08/22/22 0100  cefTRIAXone (ROCEPHIN) 2 g in sodium chloride 0.9 % 100 mL IVPB        2 g 200 mL/hr over 30 Minutes Intravenous  Once 08/22/22 0047 08/22/22 0226   08/22/22 0100  metroNIDAZOLE (FLAGYL) IVPB 500 mg        500 mg 100 mL/hr over 60 Minutes Intravenous  Once 08/22/22 0047 08/22/22 0412        Assessment/Plan: Patient Active Problem List   Diagnosis Date Noted   Anemia of chronic disease 08/29/2022   Sigmoid diverticulitis 08/22/2022   Morbid obesity (HCC) 02/19/2022   Colon polyp 09/26/2017  Hemorrhoids 09/26/2017   Hypokalemia 07/29/2017   Major depressive disorder with single episode, in full remission (HCC) 07/29/2017   Constipation 02/25/2017   Diverticulitis of large intestine 02/25/2017   Lower abdominal pain 02/25/2017   GERD (gastroesophageal reflux disease) 01/15/2017   Morbid obesity with BMI of 40.0-44.9, adult (HCC) 01/15/2017   OAB (overactive bladder) 01/15/2017   OSA (obstructive sleep apnea) 12/04/2016   Primary osteoarthritis of both first carpometacarpal joints 11/20/2016   Primary osteoarthritis of left knee 11/20/2016   Primary osteoarthritis of right knee 11/20/2016   Chronic pain of both knees 11/05/2016   Edema leg 11/05/2016    Hypertension 02/19/2008   ARTHRITIS 02/19/2008   LOW BACK PAIN 02/19/2008   s/p Procedure(s): OPEN PARTIAL COLECTOMY WITH COLOSTOMY 08/31/2022  -Tmax 38.6 - UA negative; CXR clear; monitor -OT recommending home health OT; continue inpt PT/OT for now] -WOCN following -I have placed TOC consult for home health engagement for bridge to home environment -Continue on liquids today; she has a not unexpected degree of postoperative ileus; would like to see colostomy is reliably productive prior to advancing to regular food. I have ordered her for maintenance IV fluids D5 1/2 NS + 20 mEq KCl -Need to follow blood work - I have therefore ordered CBC, BMP today, scheduled for next 3 days -Once reliably tolerating full liquids without any issues with distention/nausea ok to advance to soft diet -Ppx: SCDs, lovenox -We will continue to follow with you  -I spent time updating her and her daughter Dot Lanes on plans of care moving forward during rounds today, general expectations and answered all related questions. They have expressed understanding and agreement with plan moving forward   LOS: 12 days   Check [email or web address] for General Surgery coverage night/weekend/holidays  No secure chat available for me given surgeries/clinic/off post call which would lead to a delay in care.    Marin Olp, MD [facility], [facility]

## 2022-09-03 NOTE — Progress Notes (Signed)
Just wanted to check we have you PROGRESS NOTE    Kristin Gibson  VFI:[id number] DOB: [birth year] DOA: 08/21/2022 PCP: Woodroe Chen, MD   Brief Narrative:  66 year old female with history of gastric sleeve surgery for weight loss February 2093, recurrent diverticulitis, recently completed 2 rounds of antibiotics presented with worsening abdominal pain with fever and chills. She was admitted with sigmoid diverticulitis and started on iv antibiotics. General surgery was consulted.  Patient underwent surgical intervention on 08/31/2022.  Assessment & Plan:   Recurrent sigmoid diverticulitis with failed outpatient treatment -Patient has history of recurrent diverticulitis and failed recent outpatient antibiotic point -Repeat CT scan on 08/25/2022 showed persistent inflammation; antibiotics were switched to cefepime and Flagyl. -General surgery following: Underwent exploratory laparotomy, sigmoid colectomy, descending colostomy, VAC dressing placement on 08/31/2022.  Antibiotic duration/wound care/diet advancement/pain management as per general surgery. -Had a temperature max of 101.5 over the last 24 hours.  Chest x-ray negative for infiltrates.  Essential hypertension -Continue amlodipine, continue.  Blood pressure currently stable.  Obesity, class II -Outpatient follow-up  GERD -Continue PPI  Hypokalemia -No new labs today  Anemia of chronic disease -Possibly from chronic illnesses.  Hemoglobin stable  DVT prophylaxis: Lovenox Code Status: Full Family Communication: None at bedside Disposition Plan: Status is: Inpatient Remains inpatient appropriate because: Of severity of illness    Consultants: General surgery  Procedures:  Exploratory laparotomy, sigmoid colectomy, descending colostomy, VAC dressing placement on 08/31/2022  Antimicrobials:  Anti-infectives (From admission, onward)    Start     Dose/Rate Route Frequency Ordered Stop   09/02/22 1400  ceFEPIme  (MAXIPIME) 2 g in sodium chloride 0.9 % 100 mL IVPB        2 g 200 mL/hr over 30 Minutes Intravenous Every 8 hours 09/02/22 1015 09/04/22 2359   08/31/22 0600  cefoTEtan (CEFOTAN) 2 g in sodium chloride 0.9 % 100 mL IVPB  Status:  Discontinued        2 g 200 mL/hr over 30 Minutes Intravenous On call to O.R. 08/30/22 1316 08/31/22 1505   08/26/22 1400  ceFEPIme (MAXIPIME) 2 g in sodium chloride 0.9 % 100 mL IVPB  Status:  Discontinued        2 g 200 mL/hr over 30 Minutes Intravenous Every 8 hours 08/26/22 0848 09/02/22 1015   08/23/22 0400  cefTRIAXone (ROCEPHIN) 2 g in sodium chloride 0.9 % 100 mL IVPB  Status:  Discontinued        2 g 200 mL/hr over 30 Minutes Intravenous Every 24 hours 08/22/22 0345 08/26/22 0848   08/22/22 1400  metroNIDAZOLE (FLAGYL) IVPB 500 mg        500 mg 100 mL/hr over 60 Minutes Intravenous Every 12 hours 08/22/22 0345 09/04/22 2359   08/22/22 0100  cefTRIAXone (ROCEPHIN) 2 g in sodium chloride 0.9 % 100 mL IVPB        2 g 200 mL/hr over 30 Minutes Intravenous  Once 08/22/22 0047 08/22/22 0226   08/22/22 0100  metroNIDAZOLE (FLAGYL) IVPB 500 mg        500 mg 100 mL/hr over 60 Minutes Intravenous  Once 08/22/22 0047 08/22/22 0412        Subjective: Patient seen and examined at bedside.  Had fever yesterday afternoon.  Denies worsening cough or shortness of breath.  Complains of intermittent abdominal pain.   Objective: Vitals:   09/02/22 2028 09/03/22 0342 09/03/22 0344 09/03/22 0618  BP: 95/63 (!) 103/58 113/78 111/70  Pulse: 94 80 79 78  Resp: 17 20  16   Temp: 98.9 F (37.2 C) 98 F (36.7 C)  98.7 F (37.1 C)  TempSrc: Oral Oral  Oral  SpO2: (!) 88% 96%  100%  Weight:      Height:        Intake/Output Summary (Last 24 hours) at 09/03/2022 0750 Last data filed at 09/03/2022 0600 Gross per 24 hour  Intake 66.81 ml  Output 945 ml  Net -878.19 ml    Filed Weights   08/21/22 2220 08/31/22 0815  Weight: 112.5 kg 112 kg     Examination:  General exam: Still on room air.  No distress  espiratory system: Decreased breath sounds at bases bilaterally cardiovascular system: Rate controlled; S1-S2 heard  gastrointestinal system: Abdomen is obese, distended still; soft and mildly tender diffusely.  Bowel sounds present extremities: Mild lower extremity edema present; no clubbing  Data Reviewed: I have personally reviewed following labs and imaging studies  CBC: Recent Labs  Lab 08/28/22 0332 08/29/22 0657 08/31/22 0739  WBC 6.5 6.3 8.3  HGB 10.8* 11.5* 11.7*  HCT 33.2* 36.1 36.3  MCV 88.5 88.5 88.8  PLT 287 313 355    Basic Metabolic Panel: Recent Labs  Lab 08/28/22 0332 08/29/22 0657 08/31/22 0739  NA 139 139 136  K 3.6 3.7 3.0*  CL 107 106 103  CO2 23 24 24   GLUCOSE 86 92 93  BUN 7* 6* 8  CREATININE 0.61 0.66 0.70  CALCIUM 8.9 8.9 8.8*  MG 2.0  --   --     GFR: Estimated Creatinine Clearance: 92.3 mL/min (by C-G formula based on SCr of 0.7 mg/dL). Liver Function Tests: No results for input(s): "AST", "ALT", "ALKPHOS", "BILITOT", "PROT", "ALBUMIN" in the last 168 hours.  No results for input(s): "LIPASE", "AMYLASE" in the last 168 hours. No results for input(s): "AMMONIA" in the last 168 hours. Coagulation Profile: No results for input(s): "INR", "PROTIME" in the last 168 hours. Cardiac Enzymes: No results for input(s): "CKTOTAL", "CKMB", "CKMBINDEX", "TROPONINI" in the last 168 hours. BNP (last 3 results) No results for input(s): "PROBNP" in the last 8760 hours. HbA1C: No results for input(s): "HGBA1C" in the last 72 hours. CBG: No results for input(s): "GLUCAP" in the last 168 hours. Lipid Profile: No results for input(s): "CHOL", "HDL", "LDLCALC", "TRIG", "CHOLHDL", "LDLDIRECT" in the last 72 hours. Thyroid Function Tests: No results for input(s): "TSH", "T4TOTAL", "FREET4", "T3FREE", "THYROIDAB" in the last 72 hours. Anemia Panel: No results for input(s): "VITAMINB12",  "FOLATE", "FERRITIN", "TIBC", "IRON", "RETICCTPCT" in the last 72 hours. Sepsis Labs: No results for input(s): "PROCALCITON", "LATICACIDVEN" in the last 168 hours.  No results found for this or any previous visit (from the past 240 hour(s)).        Radiology Studies: DG CHEST PORT 1 VIEW  Result Date: 09/02/2022 CLINICAL DATA:  Fever EXAM: PORTABLE CHEST 1 VIEW COMPARISON:  02/17/2021 FINDINGS: Mild cardiomegaly. No confluent airspace opacities or effusions. No acute bony abnormality. IMPRESSION: No active disease. Electronically Signed   By: 11/02/2022 M.D.   On: 09/02/2022 19:18        Scheduled Meds:  acetaminophen  1,000 mg Oral Q6H   amLODipine  10 mg Oral Q supper   cloNIDine  0.1 mg Oral Q supper   enoxaparin (LOVENOX) injection  40 mg Subcutaneous Q24H   feeding supplement  237 mL Oral BID BM   gabapentin  300 mg Oral TID   lip balm   Topical BID   multivitamin  with minerals  1 tablet Oral Daily   pantoprazole  40 mg Oral Daily   polycarbophil  625 mg Oral BID   sodium chloride flush  3 mL Intravenous Q12H   Continuous Infusions:  sodium chloride     ceFEPime (MAXIPIME) IV 2 g (09/03/22 0508)   lactated ringers     methocarbamol (ROBAXIN) IV 1,000 mg (09/03/22 0539)   metronidazole 500 mg (09/03/22 0507)   ondansetron (ZOFRAN) IV            Glade Lloyd, MD [facility] 09/03/2022, 7:50 AM

## 2022-09-03 NOTE — Progress Notes (Signed)
Initial Nutrition Assessment  DOCUMENTATION CODES:   Obesity unspecified  INTERVENTION:   -Ensure Surgery PO BID, each provides 330 kcals and 18g protein   -Messaged Pharmacy about ordering Bariatric Advantage EA MVI BID -to start tomorrow  -Prosource Plus PO BID, each provides 100 kcals and 15g protein   NUTRITION DIAGNOSIS:   Increased nutrient needs related to post-op healing (h/o gastric sleeve) as evidenced by estimated needs.  GOAL:   Patient will meet greater than or equal to 90% of their needs  MONITOR:   PO intake, Supplement acceptance, Labs, Weight trends, I & O's  REASON FOR ASSESSMENT:   Consult Assessment of nutrition requirement/status  ASSESSMENT:   66 year old female with history of gastric sleeve surgery for weight loss February 2023, recurrent diverticulitis, recently completed 2 rounds of antibiotics presented with worsening abdominal pain with fever and chills. She was admitted with sigmoid diverticulitis and started on iv antibiotics.  9/8 s/p partial colectomy, colostomy  Patient in room, daughter at bedside. Pt reports recently vomiting and feeling incredibly nauseous. Tried to drink hot chocolate, ate 1/2 bowl grits and another fluid. Feels like she ate too much. Willing to try Ensure again later today and we discussed also a trial of Prosource Plus for protein, a low volume option.  Pt is taking daily MVI in pill form. Requested chewable MVI which she takes at home. Messaged Pharmacy and they were able to place order for Bariatric Advantage EA to start tomorrow.   Pt has been compliant with her diet recommendations following gastric sleeve in February 2023. Weight prior to surgery 294 lbs. She has been taking her daily MVI as well as calcium.  Current weight: 247 lbs.  Pt will be at higher risk of nutritional deficiencies following colectomy. Would monitor vitamin labs regularly.   Medications: Multivitamin with minerals daily, Fibercon,  KLOR-CON, D5 infusion  Labs reviewed: Low K   NUTRITION - FOCUSED PHYSICAL EXAM:  Pt deferred, feeling too nauseous. Reports vomiting within the last hour. Will attempt at later time.  Diet Order:   Diet Order             Diet full liquid Room service appropriate? Yes; Fluid consistency: Thin  Diet effective now                   EDUCATION NEEDS:   No education needs have been identified at this time  Skin:  Skin Assessment: Reviewed RN Assessment  Last BM:  9/11 -colostomy  Height:   Ht Readings from Last 1 Encounters:  08/31/22 5\' 9"  (1.753 m)    Weight:   Wt Readings from Last 1 Encounters:  08/31/22 112 kg    BMI:  Body mass index is 36.48 kg/m.  Estimated Nutritional Needs:   Kcal:  1700-1900  Protein:  95-105g  Fluid:  1.9L/day  10/31/22, MS, RD, LDN Inpatient Clinical Dietitian Contact information available via Amion

## 2022-09-03 NOTE — Plan of Care (Signed)
Discussed with patient plan of care for the evening, pain management and nausea medications with some teach back displayed.  She stated with her current vomiting MD told her no ice chips and to remain NPO at this time.  Problem: Education: Goal: Knowledge of General Education information will improve Description: Including pain rating scale, medication(s)/side effects and non-pharmacologic comfort measures Outcome: Progressing   Problem: Health Behavior/Discharge Planning: Goal: Ability to manage health-related needs will improve Outcome: Progressing

## 2022-09-04 DIAGNOSIS — K5732 Diverticulitis of large intestine without perforation or abscess without bleeding: Secondary | ICD-10-CM | POA: Diagnosis not present

## 2022-09-04 DIAGNOSIS — D638 Anemia in other chronic diseases classified elsewhere: Secondary | ICD-10-CM | POA: Diagnosis not present

## 2022-09-04 DIAGNOSIS — I10 Essential (primary) hypertension: Secondary | ICD-10-CM | POA: Diagnosis not present

## 2022-09-04 LAB — CBC WITH DIFFERENTIAL/PLATELET
Abs Immature Granulocytes: 0.13 10*3/uL — ABNORMAL HIGH (ref 0.00–0.07)
Basophils Absolute: 0 10*3/uL (ref 0.0–0.1)
Basophils Relative: 0 %
Eosinophils Absolute: 0.2 10*3/uL (ref 0.0–0.5)
Eosinophils Relative: 1 %
HCT: 30.2 % — ABNORMAL LOW (ref 36.0–46.0)
Hemoglobin: 9.6 g/dL — ABNORMAL LOW (ref 12.0–15.0)
Immature Granulocytes: 1 %
Lymphocytes Relative: 8 %
Lymphs Abs: 1.1 10*3/uL (ref 0.7–4.0)
MCH: 28.6 pg (ref 26.0–34.0)
MCHC: 31.8 g/dL (ref 30.0–36.0)
MCV: 89.9 fL (ref 80.0–100.0)
Monocytes Absolute: 1 10*3/uL (ref 0.1–1.0)
Monocytes Relative: 7 %
Neutro Abs: 11 10*3/uL — ABNORMAL HIGH (ref 1.7–7.7)
Neutrophils Relative %: 83 %
Platelets: 298 10*3/uL (ref 150–400)
RBC: 3.36 MIL/uL — ABNORMAL LOW (ref 3.87–5.11)
RDW: 15 % (ref 11.5–15.5)
WBC: 13.3 10*3/uL — ABNORMAL HIGH (ref 4.0–10.5)
nRBC: 0 % (ref 0.0–0.2)

## 2022-09-04 LAB — BASIC METABOLIC PANEL
Anion gap: 5 (ref 5–15)
BUN: 13 mg/dL (ref 8–23)
CO2: 25 mmol/L (ref 22–32)
Calcium: 8.2 mg/dL — ABNORMAL LOW (ref 8.9–10.3)
Chloride: 107 mmol/L (ref 98–111)
Creatinine, Ser: 0.79 mg/dL (ref 0.44–1.00)
GFR, Estimated: >60 mL/min (ref >60)
Glucose, Bld: 100 mg/dL — ABNORMAL HIGH (ref 70–99)
Potassium: 3.6 mmol/L (ref 3.5–5.1)
Sodium: 137 mmol/L (ref 135–145)

## 2022-09-04 MED ORDER — FENTANYL CITRATE PF 50 MCG/ML IJ SOSY
25.0000 ug | PREFILLED_SYRINGE | Freq: Once | INTRAMUSCULAR | Status: AC
  Administered 2022-09-04: 25 ug via INTRAVENOUS
  Filled 2022-09-04: qty 1

## 2022-09-04 MED ORDER — FENTANYL CITRATE PF 50 MCG/ML IJ SOSY
25.0000 ug | PREFILLED_SYRINGE | INTRAMUSCULAR | Status: DC | PRN
  Administered 2022-09-04: 50 ug via INTRAVENOUS
  Administered 2022-09-04: 25 ug via INTRAVENOUS
  Administered 2022-09-04: 50 ug via INTRAVENOUS
  Administered 2022-09-05: 25 ug via INTRAVENOUS
  Administered 2022-09-05 – 2022-09-12 (×22): 50 ug via INTRAVENOUS
  Filled 2022-09-04 (×26): qty 1

## 2022-09-04 MED ORDER — FENTANYL CITRATE PF 50 MCG/ML IJ SOSY: 25.0000 ug | PREFILLED_SYRINGE | INTRAMUSCULAR | Status: DC | PRN

## 2022-09-04 MED ORDER — PANTOPRAZOLE SODIUM 40 MG IV SOLR
40.0000 mg | Freq: Every day | INTRAVENOUS | Status: DC
  Administered 2022-09-04 – 2022-09-19 (×16): 40 mg via INTRAVENOUS
  Filled 2022-09-04 (×17): qty 10

## 2022-09-04 MED ORDER — HYDRALAZINE HCL 20 MG/ML IJ SOLN
5.0000 mg | INTRAMUSCULAR | Status: DC | PRN
  Administered 2022-09-06: 5 mg via INTRAVENOUS
  Filled 2022-09-04: qty 1

## 2022-09-04 NOTE — Progress Notes (Signed)
Patient placed self on CPAP. Patient resting comfortably.

## 2022-09-04 NOTE — Progress Notes (Signed)
Physical Therapy Treatment Patient Details Name: Kristin Gibson MRN: [id number] DOB: October 29, 1956 Today's Date: 09/04/2022   History of Present Illness 66 year old female with history of gastric sleeve surgery for weight loss February 2023, recurrent diverticulitis, recently completed 2 rounds of antibiotics presented with worsening abdominal pain with fever and chills. She was admitted with sigmoid diverticulitis and started on iv antibiotics. General surgery was consulted.  Patient underwent a exploratory laparotomy and colostomy on 08/31/2022.    PT Comments    Alerted by RN that patient was getting pain medication and was ready to gt up and return to bed. Assisted with standing, steps to  turn to bed, assist legs back onto bed. Continue PT. Patient may benefit from HHPT if agreeable.    Recommendations for follow up therapy are one component of a multi-disciplinary discharge planning process, led by the attending physician.  Recommendations may be updated based on patient status, additional functional criteria and insurance authorization.  Follow Up Recommendations  HHPT     Assistance Recommended at Discharge Intermittent Supervision/Assistance  Patient can return home with the following A little help with walking and/or transfers;A little help with bathing/dressing/bathroom;Assistance with cooking/housework;Assist for transportation;Help with stairs or ramp for entrance   Equipment Recommendations  None recommended by PT    Recommendations for Other Services       Precautions / Restrictions Precautions Precautions: Fall Precaution Comments: colostomy, VAC     Mobility  Bed Mobility             Sit to sidelying: Mod assist General bed mobility comments: assist with legs onto bed    Transfers Overall transfer level: Needs assistance Equipment used: Rolling walker (2 wheels) Transfers: Sit to/from Stand Sit to Stand: Min assist           General transfer  comment: assist to power up from recliner(set built up), stepped around to bed x 4'.    Ambulation/Gait                   Stairs             Wheelchair Mobility    Modified Rankin (Stroke Patients Only)       Balance   Sitting-balance support: No upper extremity supported, Feet supported Sitting balance-Leahy Scale: Good     Standing balance support: During functional activity, Bilateral upper extremity supported Standing balance-Leahy Scale: Fair Standing balance comment: Fair with rolling walker                            Cognition Arousal/Alertness: Awake/alert                                              Exercises      General Comments        Pertinent Vitals/Pain Pain Assessment Pain Score: 7  Pain Location: abdomen Pain Descriptors / Indicators: Aching, Sore Pain Intervention(s): RN gave pain meds during session, Monitored during session    Home Living                          Prior Function            PT Goals (current goals can now be found in the care plan section) Progress towards PT goals: Progressing toward goals  Frequency    Min 3X/week      PT Plan Current plan remains appropriate    Co-evaluation              AM-PAC PT "6 Clicks" Mobility   Outcome Measure  Help needed turning from your back to your side while in a flat bed without using bedrails?: A Lot Help needed moving from lying on your back to sitting on the side of a flat bed without using bedrails?: A Lot Help needed moving to and from a bed to a chair (including a wheelchair)?: A Lot Help needed standing up from a chair using your arms (e.g., wheelchair or bedside chair)?: A Lot Help needed to walk in hospital room?: A Little Help needed climbing 3-5 steps with a railing? : A Lot 6 Click Score: 13    End of Session   Activity Tolerance: Patient limited by fatigue;Patient limited by pain Patient left:  in bed;with call bell/phone within reach;with bed alarm set;with nursing/sitter in room Nurse Communication: Mobility status PT Visit Diagnosis: Difficulty in walking, not elsewhere classified (R26.2)     Time: 3016-0109 PT Time Calculation (min) (ACUTE ONLY): 15 min  Charges:  $Therapeutic Activity: 8-22 mins                     Blanchard Kelch PT Acute Rehabilitation Services Office [phone] Weekend [phone]    Rada Hay 09/04/2022, 3:16 PM

## 2022-09-04 NOTE — Progress Notes (Signed)
Occupational Therapy Treatment Patient Details Name: Kristin Gibson MRN: [id number] DOB: 1956-07-20 Today's Date: 09/04/2022   History of present illness 66 year old female with history of gastric sleeve surgery for weight loss February 2023, recurrent diverticulitis, recently completed 2 rounds of antibiotics presented with worsening abdominal pain with fever and chills. She was admitted with sigmoid diverticulitis and started on iv antibiotics. General surgery was consulted.  Patient underwent a exploratory laparotomy and colostomy on 08/31/2022.   OT comments  The pt was found seated EOB & she was agreeable to therapy participation. She performed grooming in sitting at the EOB with set-up assist. She further required min guard assist with slightly increased time and effort for sit to stand, then min guard assist to ambulate to the bedside chair using a rolling walker. Given reports of 5/10 abdominal pain, she intermittently presented with guarded and slowed movements. She required instruction on posterior scooting at chair level, emphasizing bending her knees and pushing with her B LE. She was repositioned to improved comfort at chair. She will continue to benefit from further OT services to maximize her safety and independence with self-care tasks, and to decrease the risk for further deconditioning.    Recommendations for follow up therapy are one component of a multi-disciplinary discharge planning process, led by the attending physician.  Recommendations may be updated based on patient status, additional functional criteria and insurance authorization.    Follow Up Recommendations  Home health OT    Assistance Recommended at Discharge Intermittent Supervision/Assistance  Patient can return home with the following  A little help with bathing/dressing/bathroom;Assistance with cooking/housework;Help with stairs or ramp for entrance   Equipment Recommendations  BSC/3in1        Precautions / Restrictions Precautions Precautions: Fall Precaution Comments: colostomy Restrictions Weight Bearing Restrictions: No       Mobility Bed Mobility               General bed mobility comments: not assessed, as the pt was seated EOB at the start of the session    Transfers Overall transfer level: Needs assistance Equipment used: Rolling walker (2 wheels) Transfers: Sit to/from Stand Sit to Stand: Min guard           General transfer comment: 1 verbal cue provided for hand placement/pushing up from the bed with at least one upper extremity     Balance     Sitting balance-Leahy Scale: Good         Standing balance comment: Fair with rolling walker            ADL either performed or assessed with clinical judgement   ADL   Eating/Feeding: Independent Eating/Feeding Details (indicate cue type and reason): based on clinical judgement Grooming: Sitting;Set up Grooming Details (indicate cue type and reason): She performed face washing and teethbrushing seated EOB.         Upper Body Dressing : Sitting;Set up           Cognition Arousal/Alertness: Awake/alert Behavior During Therapy: WFL for tasks assessed/performed Overall Cognitive Status: Within Functional Limits for tasks assessed                                                     Pertinent Vitals/ Pain       Pain Assessment Pain Assessment: 0-10 Pain Score: 6  Faces  Pain Scale: Hurts even more Pain Location: abdomen Pain Intervention(s): Repositioned, Limited activity within patient's tolerance (RN made aware)         Frequency  Min 2X/week        Progress Toward Goals  OT Goals(current goals can now be found in the care plan section)  Progress towards OT goals: Progressing toward goals  Acute Rehab OT Goals Patient Stated Goal: not specifically stated during this session OT Goal Formulation: With patient Time For Goal Achievement:  09/16/22 Potential to Achieve Goals: Good  Plan         AM-PAC OT "6 Clicks" Daily Activity     Outcome Measure   Help from another person eating meals?: None Help from another person taking care of personal grooming?: None Help from another person toileting, which includes using toliet, bedpan, or urinal?: A Lot Help from another person bathing (including washing, rinsing, drying)?: A Lot Help from another person to put on and taking off regular upper body clothing?: A Little Help from another person to put on and taking off regular lower body clothing?: A Lot 6 Click Score: 17    End of Session Equipment Utilized During Treatment: Rolling walker (2 wheels);Gait belt  OT Visit Diagnosis: Pain;Muscle weakness (generalized) (M62.81)   Activity Tolerance Patient limited by pain   Patient Left in chair;with call bell/phone within reach;with chair alarm set   Nurse Communication Mobility status        Time: 1102-1130 OT Time Calculation (min): 28 min  Charges: OT General Charges $OT Visit: 1 Visit OT Treatments $Self Care/Home Management : 8-22 mins $Therapeutic Activity: 8-22 mins    Reuben Likes, OTR/L 09/04/2022, 11:50 AM

## 2022-09-04 NOTE — Progress Notes (Signed)
Subjective No acute events. Had some nausea/vomiting early yesterday, no emesis since. Hasn't had much PO aside from meds. No significant output from her colostomy yet. Pain is controlled. She is in good spirits.   Her daughter Emonii Wienke is present at bedside  Objective: Vital signs in last 24 hours: Temp:  [98.8 F (37.1 C)-99.4 F (37.4 C)] 99.4 F (37.4 C) (09/12 0703) Pulse Rate:  [81-91] 91 (09/12 0703) Resp:  [19-20] 20 (09/12 0703) BP: (111-126)/(69-82) 126/82 (09/12 0703) SpO2:  [92 %-94 %] 94 % (09/12 0703) Last BM Date : 09/01/22  Intake/Output from previous day: 09/11 0701 - 09/12 0700 In: 2704.1 [I.V.:1630.8; IV Piggyback:1073.3] Out: 1825 [Urine:1475; Emesis/NG output:350] Intake/Output this shift: No intake/output data recorded.  AF, normal vital signs  Gen: NAD, comfortable CV: RRR Pulm: Normal work of breathing Abd: Soft, wound VAC in place with clean edges; scant serosanguinous effluent in canister. Ostomy light pink and productive of 'sweat' and small volume particulate in appliance. Ext: SCDs in place  Lab Results: CBC  Recent Labs    09/03/22 1012 09/04/22 0343  WBC 15.5* 13.3*  HGB 10.2* 9.6*  HCT 32.7* 30.2*  PLT 336 298   BMET Recent Labs    09/03/22 1012 09/04/22 0343  NA 138 137  K 3.2* 3.6  CL 109 107  CO2 23 25  GLUCOSE 110* 100*  BUN 16 13  CREATININE 0.86 0.79  CALCIUM 8.8* 8.2*   PT/INR No results for input(s): "LABPROT", "INR" in the last 72 hours. ABG No results for input(s): "PHART", "HCO3" in the last 72 hours.  Invalid input(s): "PCO2", "PO2"  Studies/Results:  Anti-infectives: Anti-infectives (From admission, onward)    Start     Dose/Rate Route Frequency Ordered Stop   09/02/22 1400  ceFEPIme (MAXIPIME) 2 g in sodium chloride 0.9 % 100 mL IVPB        2 g 200 mL/hr over 30 Minutes Intravenous Every 8 hours 09/02/22 1015 09/04/22 2359   08/31/22 0600  cefoTEtan (CEFOTAN) 2 g in sodium chloride 0.9 %  100 mL IVPB  Status:  Discontinued        2 g 200 mL/hr over 30 Minutes Intravenous On call to O.R. 08/30/22 1316 08/31/22 1505   08/26/22 1400  ceFEPIme (MAXIPIME) 2 g in sodium chloride 0.9 % 100 mL IVPB  Status:  Discontinued        2 g 200 mL/hr over 30 Minutes Intravenous Every 8 hours 08/26/22 0848 09/02/22 1015   08/23/22 0400  cefTRIAXone (ROCEPHIN) 2 g in sodium chloride 0.9 % 100 mL IVPB  Status:  Discontinued        2 g 200 mL/hr over 30 Minutes Intravenous Every 24 hours 08/22/22 0345 08/26/22 0848   08/22/22 1400  metroNIDAZOLE (FLAGYL) IVPB 500 mg        500 mg 100 mL/hr over 60 Minutes Intravenous Every 12 hours 08/22/22 0345 09/04/22 2359   08/22/22 0100  cefTRIAXone (ROCEPHIN) 2 g in sodium chloride 0.9 % 100 mL IVPB        2 g 200 mL/hr over 30 Minutes Intravenous  Once 08/22/22 0047 08/22/22 0226   08/22/22 0100  metroNIDAZOLE (FLAGYL) IVPB 500 mg        500 mg 100 mL/hr over 60 Minutes Intravenous  Once 08/22/22 0047 08/22/22 0412        Assessment/Plan: Patient Active Problem List   Diagnosis Date Noted   Anemia of chronic disease 08/29/2022   Sigmoid diverticulitis 08/22/2022  Morbid obesity (HCC) 02/19/2022   Colon polyp 09/26/2017   Hemorrhoids 09/26/2017   Hypokalemia 07/29/2017   Major depressive disorder with single episode, in full remission (HCC) 07/29/2017   Constipation 02/25/2017   Diverticulitis of large intestine 02/25/2017   Lower abdominal pain 02/25/2017   GERD (gastroesophageal reflux disease) 01/15/2017   Morbid obesity with BMI of 40.0-44.9, adult (HCC) 01/15/2017   OAB (overactive bladder) 01/15/2017   OSA (obstructive sleep apnea) 12/04/2016   Primary osteoarthritis of both first carpometacarpal joints 11/20/2016   Primary osteoarthritis of left knee 11/20/2016   Primary osteoarthritis of right knee 11/20/2016   Chronic pain of both knees 11/05/2016   Edema leg 11/05/2016   Hypertension 02/19/2008   ARTHRITIS 02/19/2008   LOW  BACK PAIN 02/19/2008   s/p Procedure(s): OPEN PARTIAL COLECTOMY WITH COLOSTOMY 08/31/2022  -WBC down-trending; continue empiric abx til POD#5 - UA negative; CXR clear; monitor -OT recommending home health OT; continue inpt PT/OT for now -WOCN following -I have placed TOC consult for home health engagement for bridge to home environment -Continue on liquids today; she has a not unexpected degree of postoperative ileus; continue to monitor -Cont MIVF -Daily cbc -If ileus persists in next 1-2 days will plan for abdominal CT to assess for intra-abdominal abscess - discussed that this exam will be much more sensitive for this process once she is at least 5-6 days out from surgery -Ppx: SCDs, lovenox -We will continue to follow with you  -I spent time updating her and her daughter Dot Lanes on plans of care moving forward during rounds today, general expectations and answered all related questions. They have expressed understanding and agreement with plan moving forward   LOS: 13 days   Check [email or web address] for General Surgery coverage night/weekend/holidays  No secure chat available for me given surgeries/clinic/off post call which would lead to a delay in care.    Marin Olp, MD [facility] Surgery, A [facility]

## 2022-09-04 NOTE — Care Management Important Message (Signed)
Important Message  Patient Details IM Letter given to the Patient. Name: Kristin Gibson MRN: [id number] Date of Birth: [birth year]   Medicare Important Message Given:  Yes     Caren Macadam 09/04/2022, 12:01 PM

## 2022-09-04 NOTE — Progress Notes (Signed)
Just wanted to check we have you PROGRESS NOTE    Kristin Gibson  KDT:[id number] DOB: 08-Jul-1956 DOA: 08/21/2022 PCP: Woodroe Chen, MD   Brief Narrative:  66 year old female with history of gastric sleeve surgery for weight loss February 2093, recurrent diverticulitis, recently completed 2 rounds of antibiotics presented with worsening abdominal pain with fever and chills. She was admitted with sigmoid diverticulitis and started on iv antibiotics. General surgery was consulted.  Patient underwent surgical intervention on 08/31/2022.  Care was transferred to general surgery service on 09/03/2022.  Assessment & Plan:   Recurrent sigmoid diverticulitis with failed outpatient treatment -Underwent exploratory laparotomy, sigmoid colectomy, descending colostomy, VAC dressing placement on 08/31/2022. Care was transferred to general surgery service on 09/03/2022.  Currently cefepime and Flagyl.  Antibiotic duration/wound care/diet advancement/pain management as per general surgery. -No temperature spikes over the last 24 hours.  Leukocytosis -Mild  Essential hypertension -Continue amlodipine, continue.  Patient is currently intermittently vomiting.  Will add as needed IV antihypertensives if needed.  Blood pressure currently stable.  Obesity, class II -Outpatient follow-up  GERD -Continue PPI  Hypokalemia -Improved  Anemia of chronic disease -Possibly from chronic illnesses.  Hemoglobin stable  Subjective: Patient seen and examined at bedside.  Has intermittent nausea and vomiting yesterday and today.  Denies worsening shortness of breath, cough.  Has intermittent abdominal pain.   Objective: Vitals:   09/03/22 0618 09/03/22 1250 09/03/22 2108 09/04/22 0703  BP: 111/70 125/82 111/69 126/82  Pulse: 78 91 81 91  Resp: 16 20 19 20   Temp: 98.7 F (37.1 C) 98.8 F (37.1 C) 99 F (37.2 C) 99.4 F (37.4 C)  TempSrc: Oral Oral Oral Oral  SpO2: 100% 92% 94% 94%  Weight:      Height:         Intake/Output Summary (Last 24 hours) at 09/04/2022 0723 Last data filed at 09/04/2022 0557 Gross per 24 hour  Intake 2704.13 ml  Output 1525 ml  Net 1179.13 ml    Filed Weights   08/21/22 2220 08/31/22 0815  Weight: 112.5 kg 112 kg    Examination:  General exam: No acute distress.  On room air currently.  Espiratory system: Bilateral decreased breath sounds at bases, scattered crackles cardiovascular system: S1-S2 heard; rate controlled currently gastrointestinal system: Abdomen is obese, still distended with diffuse tenderness.  Bowel sounds present.  Ostomy and wound VAC present. extremities: No cyanosis; trace lower extremity edema present  Data Reviewed: I have personally reviewed following labs and imaging studies  CBC: Recent Labs  Lab 08/29/22 0657 08/31/22 0739 09/03/22 1012 09/04/22 0343  WBC 6.3 8.3 15.5* 13.3*  NEUTROABS  --   --  13.5* 11.0*  HGB 11.5* 11.7* 10.2* 9.6*  HCT 36.1 36.3 32.7* 30.2*  MCV 88.5 88.8 90.1 89.9  PLT 313 355 336 298    Basic Metabolic Panel: Recent Labs  Lab 08/29/22 0657 08/31/22 0739 09/03/22 1012 09/04/22 0343  NA 139 136 138 137  K 3.7 3.0* 3.2* 3.6  CL 106 103 109 107  CO2 24 24 23 25   GLUCOSE 92 93 110* 100*  BUN 6* 8 16 13   CREATININE 0.66 0.70 0.86 0.79  CALCIUM 8.9 8.8* 8.8* 8.2*  MG  --   --  2.1  --     GFR: Estimated Creatinine Clearance: 92.3 mL/min (by C-G formula based on SCr of 0.79 mg/dL). Liver Function Tests: No results for input(s): "AST", "ALT", "ALKPHOS", "BILITOT", "PROT", "ALBUMIN" in the last 168 hours.  No  results for input(s): "LIPASE", "AMYLASE" in the last 168 hours. No results for input(s): "AMMONIA" in the last 168 hours. Coagulation Profile: No results for input(s): "INR", "PROTIME" in the last 168 hours. Cardiac Enzymes: No results for input(s): "CKTOTAL", "CKMB", "CKMBINDEX", "TROPONINI" in the last 168 hours. BNP (last 3 results) No results for input(s): "PROBNP" in the  last 8760 hours. HbA1C: No results for input(s): "HGBA1C" in the last 72 hours. CBG: No results for input(s): "GLUCAP" in the last 168 hours. Lipid Profile: No results for input(s): "CHOL", "HDL", "LDLCALC", "TRIG", "CHOLHDL", "LDLDIRECT" in the last 72 hours. Thyroid Function Tests: No results for input(s): "TSH", "T4TOTAL", "FREET4", "T3FREE", "THYROIDAB" in the last 72 hours. Anemia Panel: No results for input(s): "VITAMINB12", "FOLATE", "FERRITIN", "TIBC", "IRON", "RETICCTPCT" in the last 72 hours. Sepsis Labs: No results for input(s): "PROCALCITON", "LATICACIDVEN" in the last 168 hours.  No results found for this or any previous visit (from the past 240 hour(s)).        Radiology Studies: DG CHEST PORT 1 VIEW  Result Date: 09/02/2022 CLINICAL DATA:  Fever EXAM: PORTABLE CHEST 1 VIEW COMPARISON:  02/17/2021 FINDINGS: Mild cardiomegaly. No confluent airspace opacities or effusions. No acute bony abnormality. IMPRESSION: No active disease. Electronically Signed   By: Charlett Nose M.D.   On: 09/02/2022 19:18        Scheduled Meds:  acetaminophen  1,000 mg Oral Q6H   amLODipine  10 mg Oral Q supper   bariatric multiple vitamin  2 tablet Oral Daily   cloNIDine  0.1 mg Oral Q supper   enoxaparin (LOVENOX) injection  40 mg Subcutaneous Q24H   feeding supplement  237 mL Oral BID BM   gabapentin  300 mg Oral TID   lip balm   Topical BID   pantoprazole  40 mg Oral Daily   sodium chloride flush  3 mL Intravenous Q12H   Continuous Infusions:  sodium chloride     ceFEPime (MAXIPIME) IV 200 mL/hr at 09/04/22 0557   dextrose 5 % and 0.45 % NaCl with KCl 20 mEq/L 100 mL/hr at 09/04/22 0557   methocarbamol (ROBAXIN) IV Stopped (09/04/22 0138)   metronidazole 500 mg (09/04/22 7096)   ondansetron (ZOFRAN) IV            Glade Lloyd, MD [facility] 09/04/2022, 7:23 AM

## 2022-09-04 NOTE — Progress Notes (Signed)
Mobility Specialist Cancellation/Refusal Note:   Reason for Cancellation/Refusal: Pt declined mobility at this time. Pt already did some mobility today & in pain. Will check back as schedule permits.      [facility]

## 2022-09-04 NOTE — Progress Notes (Signed)
PT Cancellation Note  Patient Details Name: RAMIAH HELFRICH MRN: [id number] DOB: [birth year]   Cancelled Treatment:    Reason Eval/Treat Not Completed: Pain limiting ability to participate, reports waiting on pain medication, recently walked in room to recliner. Will check back another time.Blanchard Kelch PT Acute Rehabilitation Services Office [phone] Weekend pager-[phone]    Rada Hay 09/04/2022, 12:31 PM

## 2022-09-05 ENCOUNTER — Telehealth: Payer: Self-pay | Admitting: Skilled Nursing Facility1

## 2022-09-05 ENCOUNTER — Inpatient Hospital Stay (HOSPITAL_COMMUNITY): Payer: Medicare HMO

## 2022-09-05 DIAGNOSIS — K5732 Diverticulitis of large intestine without perforation or abscess without bleeding: Secondary | ICD-10-CM | POA: Diagnosis not present

## 2022-09-05 LAB — CBC WITH DIFFERENTIAL/PLATELET
Abs Immature Granulocytes: 0.19 10*3/uL — ABNORMAL HIGH (ref 0.00–0.07)
Basophils Absolute: 0 10*3/uL (ref 0.0–0.1)
Basophils Relative: 0 %
Eosinophils Absolute: 0.1 10*3/uL (ref 0.0–0.5)
Eosinophils Relative: 1 %
HCT: 33.5 % — ABNORMAL LOW (ref 36.0–46.0)
Hemoglobin: 10.6 g/dL — ABNORMAL LOW (ref 12.0–15.0)
Immature Granulocytes: 2 %
Lymphocytes Relative: 10 %
Lymphs Abs: 1 10*3/uL (ref 0.7–4.0)
MCH: 28.4 pg (ref 26.0–34.0)
MCHC: 31.6 g/dL (ref 30.0–36.0)
MCV: 89.8 fL (ref 80.0–100.0)
Monocytes Absolute: 0.8 10*3/uL (ref 0.1–1.0)
Monocytes Relative: 8 %
Neutro Abs: 8 10*3/uL — ABNORMAL HIGH (ref 1.7–7.7)
Neutrophils Relative %: 79 %
Platelets: 311 10*3/uL (ref 150–400)
RBC: 3.73 MIL/uL — ABNORMAL LOW (ref 3.87–5.11)
RDW: 14.8 % (ref 11.5–15.5)
WBC: 10.2 10*3/uL (ref 4.0–10.5)
nRBC: 0 % (ref 0.0–0.2)

## 2022-09-05 LAB — BASIC METABOLIC PANEL
Anion gap: 8 (ref 5–15)
BUN: 7 mg/dL — ABNORMAL LOW (ref 8–23)
CO2: 24 mmol/L (ref 22–32)
Calcium: 8.3 mg/dL — ABNORMAL LOW (ref 8.9–10.3)
Chloride: 104 mmol/L (ref 98–111)
Creatinine, Ser: 0.74 mg/dL (ref 0.44–1.00)
GFR, Estimated: >60 mL/min (ref >60)
Glucose, Bld: 122 mg/dL — ABNORMAL HIGH (ref 70–99)
Potassium: 2.9 mmol/L — ABNORMAL LOW (ref 3.5–5.1)
Sodium: 136 mmol/L (ref 135–145)

## 2022-09-05 MED ORDER — IOHEXOL 9 MG/ML PO SOLN
500.0000 mL | ORAL | Status: AC
  Administered 2022-09-05: 500 mL via ORAL

## 2022-09-05 MED ORDER — IOHEXOL 300 MG/ML  SOLN
100.0000 mL | Freq: Once | INTRAMUSCULAR | Status: AC | PRN
  Administered 2022-09-05: 100 mL via INTRAVENOUS

## 2022-09-05 MED ORDER — SODIUM CHLORIDE (PF) 0.9 % IJ SOLN
INTRAMUSCULAR | Status: AC
  Filled 2022-09-05: qty 50

## 2022-09-05 MED ORDER — POTASSIUM CHLORIDE 20 MEQ PO PACK: 40.0000 meq | PACK | Freq: Once | ORAL | Status: DC

## 2022-09-05 MED ORDER — IOHEXOL 9 MG/ML PO SOLN
ORAL | Status: AC
  Filled 2022-09-05: qty 1000

## 2022-09-05 MED ORDER — POTASSIUM CHLORIDE 10 MEQ/100ML IV SOLN
10.0000 meq | INTRAVENOUS | Status: AC
  Administered 2022-09-05 (×5): 10 meq via INTRAVENOUS
  Filled 2022-09-05 (×5): qty 100

## 2022-09-05 NOTE — Progress Notes (Signed)
Mobility Specialist Cancellation/Refusal Note:   Reason for Cancellation/Refusal: Pt declined mobility at this time. Pt getting dressing change & in pain. Will check back as schedule permits.      [facility]

## 2022-09-05 NOTE — Progress Notes (Addendum)
Subjective Had some nausea/vomiting overnight. Hasn't had much PO aside from meds. No significant output from her colostomy yet. Pain is controlled.  Her daughter Genoa Freyre is present at bedside  Objective: Vital signs in last 24 hours: Temp:  [98.7 F (37.1 C)-99.3 F (37.4 C)] 98.9 F (37.2 C) (09/13 0531) Pulse Rate:  [85-97] 97 (09/13 0531) Resp:  [18-20] 19 (09/13 0531) BP: (113-166)/(85-110) 147/92 (09/13 0531) SpO2:  [94 %-100 %] 94 % (09/13 0531) Last BM Date : 09/01/22  Intake/Output from previous day: 09/12 0701 - 09/13 0700 In: 2943.2 [I.V.:2257.2; IV Piggyback:686] Out: 2980 [Urine:2950; Stool:30] Intake/Output this shift: Total I/O In: -  Out: 1400 [Urine:1400]  AF, normal vital signs  Gen: NAD, comfortable CV: RRR Pulm: Normal work of breathing Abd: Soft, wound VAC in place with clean edges; scant serosanguinous effluent in canister. Ostomy light pink and productive of 'sweat' and small volume particulate in appliance. Ext: SCDs in place  Lab Results: CBC  Recent Labs    09/04/22 0343 09/05/22 0351  WBC 13.3* 10.2  HGB 9.6* 10.6*  HCT 30.2* 33.5*  PLT 298 311   BMET Recent Labs    09/04/22 0343 09/05/22 0351  NA 137 136  K 3.6 2.9*  CL 107 104  CO2 25 24  GLUCOSE 100* 122*  BUN 13 7*  CREATININE 0.79 0.74  CALCIUM 8.2* 8.3*   PT/INR No results for input(s): "LABPROT", "INR" in the last 72 hours. ABG No results for input(s): "PHART", "HCO3" in the last 72 hours.  Invalid input(s): "PCO2", "PO2"  Studies/Results:  Anti-infectives: Anti-infectives (From admission, onward)    Start     Dose/Rate Route Frequency Ordered Stop   09/02/22 1400  ceFEPIme (MAXIPIME) 2 g in sodium chloride 0.9 % 100 mL IVPB        2 g 200 mL/hr over 30 Minutes Intravenous Every 8 hours 09/02/22 1015 09/05/22 0103   08/31/22 0600  cefoTEtan (CEFOTAN) 2 g in sodium chloride 0.9 % 100 mL IVPB  Status:  Discontinued        2 g 200 mL/hr over 30  Minutes Intravenous On call to O.R. 08/30/22 1316 08/31/22 1505   08/26/22 1400  ceFEPIme (MAXIPIME) 2 g in sodium chloride 0.9 % 100 mL IVPB  Status:  Discontinued        2 g 200 mL/hr over 30 Minutes Intravenous Every 8 hours 08/26/22 0848 09/02/22 1015   08/23/22 0400  cefTRIAXone (ROCEPHIN) 2 g in sodium chloride 0.9 % 100 mL IVPB  Status:  Discontinued        2 g 200 mL/hr over 30 Minutes Intravenous Every 24 hours 08/22/22 0345 08/26/22 0848   08/22/22 1400  metroNIDAZOLE (FLAGYL) IVPB 500 mg        500 mg 100 mL/hr over 60 Minutes Intravenous Every 12 hours 08/22/22 0345 09/05/22 0449   08/22/22 0100  cefTRIAXone (ROCEPHIN) 2 g in sodium chloride 0.9 % 100 mL IVPB        2 g 200 mL/hr over 30 Minutes Intravenous  Once 08/22/22 0047 08/22/22 0226   08/22/22 0100  metroNIDAZOLE (FLAGYL) IVPB 500 mg        500 mg 100 mL/hr over 60 Minutes Intravenous  Once 08/22/22 0047 08/22/22 0412        Assessment/Plan: Patient Active Problem List   Diagnosis Date Noted   Anemia of chronic disease 08/29/2022   Sigmoid diverticulitis 08/22/2022   Morbid obesity (HCC) 02/19/2022   Colon polyp 09/26/2017  Hemorrhoids 09/26/2017   Hypokalemia 07/29/2017   Major depressive disorder with single episode, in full remission (HCC) 07/29/2017   Constipation 02/25/2017   Diverticulitis of large intestine 02/25/2017   Lower abdominal pain 02/25/2017   GERD (gastroesophageal reflux disease) 01/15/2017   Morbid obesity with BMI of 40.0-44.9, adult (HCC) 01/15/2017   OAB (overactive bladder) 01/15/2017   OSA (obstructive sleep apnea) 12/04/2016   Primary osteoarthritis of both first carpometacarpal joints 11/20/2016   Primary osteoarthritis of left knee 11/20/2016   Primary osteoarthritis of right knee 11/20/2016   Chronic pain of both knees 11/05/2016   Edema leg 11/05/2016   Hypertension 02/19/2008   ARTHRITIS 02/19/2008   LOW BACK PAIN 02/19/2008   s/p Procedure(s): OPEN PARTIAL COLECTOMY  WITH COLOSTOMY 08/31/2022  -WBC down-trending; continue empiric abx til POD#5 - UA negative; CXR clear; monitor -OT recommending home health OT; continue inpt PT/OT for now -WOCN following -We have placed TOC consult for home health engagement for bridge to home environment -Not reliably tolerating PO, CT today, may need PICC/TPN in coming day if things aren't turning the corner. NPO today except meds -Cont MIVF -Daily cbc - wbc now normal -Persistent ileus, will obtain CT A/P with contrast to further evaluate -Ppx: SCDs, lovenox  -I spent time updating her and her daughter Dot Lanes at bedside today on plans of care moving forward during rounds today, general expectations and answered all related questions. They have expressed understanding and agreement with plan moving forward   LOS: 14 days   Check [email or web address] for General Surgery coverage night/weekend/holidays  No secure chat available for me given surgeries/clinic/off post call which would lead to a delay in care.    Marin Olp, MD [facility] Surgery, A [facility]

## 2022-09-05 NOTE — Progress Notes (Signed)
PROGRESS NOTE    Kristin Gibson  ZOX:[id number] DOB: [birth year] DOA: 08/21/2022 PCP: Woodroe Chen, MD    Brief Narrative:   Kristin Gibson is a 66 y.o. female with past medical history significant for morbid obesity s/p gastric sleeve surgery February 2023, recurrent diverticulitis in which she recently completed 2 rounds of antibiotics who presented to [facility] ED on 8/29 with progressive/recurrent abdominal pain, fever/chills.  Work-up in the ED notable for recurrent sigmoid diverticulitis.  Patient was started on IV antibiotics.  General surgery was consulted.  Initially admitted to the hospital service.  Patient underwent exploratory laparotomy, sigmoid colectomy, descending colostomy with wound VAC placement by Dr. Gerrit Friends on 08/31/2022.  Care was transferred to the general surgery service on 09/03/2022.  Assessment & Plan:   Recurrent sigmoid diverticulitis Patient presenting to ED with progressive abdominal pain associate with fever/chills.  Work-up revealing recurrent sigmoid diverticulitis.  Patient recently completed 2 courses of outpatient antibiotics which she has failed.  General surgery was consulted and patient underwent exploratory laparotomy with sigmoid colectomy, descending colostomy with wound VAC placement by Dr. Gerrit Friends on 08/31/2022.  --Completed course of antibiotics with cefepime/metronidazole --Surgery repeating CT abdomen/pelvis today --D5 half NS with 20 mEq KCL at 111mL/h --N.p.o.; may need TPN if unable to advance diet soon --Wound care, pain control, diet advancement per general surgery  Essential hypertension -- Amlodipine 10 mg p.o. daily -- Clonidine 0.1 mg p.o. qPM -- Hydralazine 5 mg IV q4h PRN SBP >170 or DBP >95  GERD -- Protonix 40 mg IV daily  Hypokalemia Potassium 2.9, will replete. --Repeat electrolytes in the a.m. to include magnesium  Morbid obesity Body mass index is 36.48 kg/m.  Discussed with patient needs for aggressive lifestyle  changes/weight loss as this complicates all facets of care.  Outpatient follow-up with PCP/bariatric surgery.   DVT prophylaxis: enoxaparin (LOVENOX) injection 40 mg Start: 09/01/22 1000 SCD's Start: 08/30/22 1310    Code Status: Full Code Family Communication: Updated multiple family members present at bedside this morning  Disposition Plan:  Level of care: Med-Surg Status is: Inpatient Remains inpatient appropriate because: Per primary, general surgery; but will need further diet advancement before stable for discharge home     Procedures:  Exploratory laparotomy with sigmoid colectomy, descending colostomy with wound VAC placement, Dr. Gerrit Friends on 08/31/2022.   Antimicrobials:  Ceftriaxone 8/29 - 8/2 Metronidazole 8/29 - 9/12 Cefepime 9/3 - 9/12   Subjective: Patient seen examined bedside, resting comfortably.  Lying in bed.  Multiple family members present.  Continues with mild abdominal pain/muscle spasms.  Continues without bowel movement, no flatus reported.  Seen by general surgery this morning, repeating CT abdomen/pelvis.  Patient with no other complaints or concerns at this time.  Denies headache, no dizziness, no chest pain, no palpitations, no shortness of breath, no fever/chills/night sweats, no nausea/vomiting, no focal weakness, no fatigue, no cough/congestion, no paresthesias.  No acute events overnight per nurse staff.  Objective: Vitals:   09/04/22 1200 09/04/22 1941 09/04/22 2330 09/05/22 0531  BP: 113/85 (!) 143/92  (!) 147/92  Pulse:  85  97  Resp:  20 18 19   Temp:  99.3 F (37.4 C)  98.9 F (37.2 C)  TempSrc:  Oral  Oral  SpO2:  97%  94%  Weight:      Height:        Intake/Output Summary (Last 24 hours) at 09/05/2022 1037 Last data filed at 09/05/2022 0751 Gross per 24 hour  Intake 2943.17 ml  Output  4380 ml  Net -1436.83 ml   Filed Weights   08/21/22 2220 08/31/22 0815  Weight: 112.5 kg 112 kg    Examination:  Physical Exam: GEN: NAD, alert  and oriented x 3, obese HEENT: NCAT, PERRL, EOMI, sclera clear, MMM PULM: CTAB w/o wheezes/crackles, normal respiratory effort, on room air CV: RRR w/o M/G/R GI: abd soft, mild TTP surrounding surgical site, slightly distended, wound VAC and ostomy noted with light pink drainage noted in ostomy bag MSK: no peripheral edema, moves all extremities independently NEURO: CN II-XII intact, no focal deficits, sensation to light touch intact PSYCH: normal mood/affect Integumentary: Surgical incision site noted with wound VAC in place, ostomy noted in place, otherwise no other concerning rashes/lesions/wounds on exposed areas     Data Reviewed: I have personally reviewed following labs and imaging studies  CBC: Recent Labs  Lab 08/31/22 0739 09/03/22 1012 09/04/22 0343 09/05/22 0351  WBC 8.3 15.5* 13.3* 10.2  NEUTROABS  --  13.5* 11.0* 8.0*  HGB 11.7* 10.2* 9.6* 10.6*  HCT 36.3 32.7* 30.2* 33.5*  MCV 88.8 90.1 89.9 89.8  PLT 355 336 298 311   Basic Metabolic Panel: Recent Labs  Lab 08/31/22 0739 09/03/22 1012 09/04/22 0343 09/05/22 0351  NA 136 138 137 136  K 3.0* 3.2* 3.6 2.9*  CL 103 109 107 104  CO2 24 23 25 24   GLUCOSE 93 110* 100* 122*  BUN 8 16 13  7*  CREATININE 0.70 0.86 0.79 0.74  CALCIUM 8.8* 8.8* 8.2* 8.3*  MG  --  2.1  --   --    GFR: Estimated Creatinine Clearance: 92.3 mL/min (by C-G formula based on SCr of 0.74 mg/dL). Liver Function Tests: No results for input(s): "AST", "ALT", "ALKPHOS", "BILITOT", "PROT", "ALBUMIN" in the last 168 hours. No results for input(s): "LIPASE", "AMYLASE" in the last 168 hours. No results for input(s): "AMMONIA" in the last 168 hours. Coagulation Profile: No results for input(s): "INR", "PROTIME" in the last 168 hours. Cardiac Enzymes: No results for input(s): "CKTOTAL", "CKMB", "CKMBINDEX", "TROPONINI" in the last 168 hours. BNP (last 3 results) No results for input(s): "PROBNP" in the last 8760 hours. HbA1C: No results for  input(s): "HGBA1C" in the last 72 hours. CBG: No results for input(s): "GLUCAP" in the last 168 hours. Lipid Profile: No results for input(s): "CHOL", "HDL", "LDLCALC", "TRIG", "CHOLHDL", "LDLDIRECT" in the last 72 hours. Thyroid Function Tests: No results for input(s): "TSH", "T4TOTAL", "FREET4", "T3FREE", "THYROIDAB" in the last 72 hours. Anemia Panel: No results for input(s): "VITAMINB12", "FOLATE", "FERRITIN", "TIBC", "IRON", "RETICCTPCT" in the last 72 hours. Sepsis Labs: No results for input(s): "PROCALCITON", "LATICACIDVEN" in the last 168 hours.  No results found for this or any previous visit (from the past 240 hour(s)).       Radiology Studies: No results found.      Scheduled Meds:  acetaminophen  1,000 mg Oral Q6H   amLODipine  10 mg Oral Q supper   bariatric multiple vitamin  2 tablet Oral Daily   cloNIDine  0.1 mg Oral Q supper   enoxaparin (LOVENOX) injection  40 mg Subcutaneous Q24H   feeding supplement  237 mL Oral BID BM   gabapentin  300 mg Oral TID   iohexol  500 mL Oral Q1H   iohexol       lip balm   Topical BID   pantoprazole (PROTONIX) IV  40 mg Intravenous QHS   sodium chloride flush  3 mL Intravenous Q12H   Continuous Infusions:  sodium chloride     dextrose 5 % and 0.45 % NaCl with KCl 20 mEq/L Stopped (09/05/22 0535)   methocarbamol (ROBAXIN) IV 1,000 mg (09/05/22 0950)   ondansetron (ZOFRAN) IV     potassium chloride 10 mEq (09/05/22 0819)     LOS: 14 days    Time spent: 51 minutes spent on chart review, discussion with nursing staff, consultants, updating family and interview/physical exam; more than 50% of that time was spent in counseling and/or coordination of care.    Alvira Philips [place], DO [facility] Available via Epic secure chat 7am-7pm After these hours, please refer to coverage provider listed on [email or web address] 09/05/2022, 10:37 AM

## 2022-09-05 NOTE — Telephone Encounter (Signed)
Called pt to check in on her not realizing she is still in the H.  Encouraged pt the Dietitian in the H will take good care of her and when released to call this dietitian for support.

## 2022-09-05 NOTE — Progress Notes (Signed)
PT not able to use cpap right now , PT is vomiting, advised her not to put it back on if she continued to feel nauseous .

## 2022-09-05 NOTE — Plan of Care (Signed)
  Problem: Education: Goal: Knowledge of General Education information will improve Description: Including pain rating scale, medication(s)/side effects and non-pharmacologic comfort measures Outcome: Progressing   Problem: Health Behavior/Discharge Planning: Goal: Ability to manage health-related needs will improve Outcome: Not Progressing   Problem: Clinical Measurements: Goal: Ability to maintain clinical measurements within normal limits will improve Outcome: Progressing Goal: Will remain free from infection Outcome: Progressing Goal: Diagnostic test results will improve Outcome: Progressing Goal: Respiratory complications will improve Outcome: Progressing Goal: Cardiovascular complication will be avoided Outcome: Completed/Met   Problem: Elimination: Goal: Will not experience complications related to bowel motility Outcome: Not Progressing Goal: Will not experience complications related to urinary retention Outcome: Progressing   Problem: Safety: Goal: Ability to remain free from injury will improve Outcome: Progressing   Problem: Pain Managment: Goal: General experience of comfort will improve Outcome: Progressing

## 2022-09-05 NOTE — Plan of Care (Signed)

## 2022-09-05 NOTE — Consult Note (Signed)
WOC Nurse wound follow up: POD 5 Daughter (not the same daughter as was present on Monday) in room.  Wound type:Surgical Measurement:Per Wednesday's measurement, 15cm x 5cm x 3cm Wound JQB:HALP pink, moist Drainage (amount, consistency, odor) small serous Periwound: intact Dressing procedure/placement/frequency: NPWT dressing removed and included 1 piece of black foam. Wound cleansed with NS, patted dry, filled with 1 piece of black foam. Periwound skin at 5-7 o'clock and at 3 o'clock (umbilical area) is protected with skin barrier ring. Drape applied and attached to continuous negative pressure. An immediate seal is achieved. Patient tolerated procedure well.  WOC Nurse ostomy follow up Stoma type/location: LLQ colostomy  Stomal assessment/size: 1 and 5/8 inch oval, red, moist, with lumen incenter and slight "tip" toward lateral aspect.  Peristomal assessment: Intact, clear Treatment options for stomal/peristomal skin: 1 and 1/2 skin barrier rings used to level stomal plane. Output: Small amount serosanguinous effluent <58mls in pouch.   Ostomy pouching: 2pc. 2 and 3/4 inch pouching system with 1 and 1/2 skin barrier rings. 1 piece compressible convex pouches are ordered to bedside and I will try those on Friday. Education provided: None today. Patent is nauseated and has been medicated with both Zofran and compazine in anticipation of her needing to consume the CT contrast prior to CT. Enrolled patient in [place] Secure Start Discharge program: No   Ostomy supplies in room.  WOC nursing team will follow, and will remain available to this patient, the nursing and medical teams.    Thank you for inviting [place] to participate in this patient's Plan of Care.  Ladona Mow, MSN, RN, CNS, GNP, Leda Min, [employer], [employer] phone:  [phone]

## 2022-09-05 NOTE — Progress Notes (Addendum)
Patient has been nauseous during the shift and had one episode of vomiting with yellow-colored emesis after three small ice chips. RN gave PRN nausea medication during the shift.

## 2022-09-06 ENCOUNTER — Inpatient Hospital Stay (HOSPITAL_COMMUNITY): Payer: Medicare HMO

## 2022-09-06 ENCOUNTER — Encounter (HOSPITAL_COMMUNITY): Payer: Self-pay | Admitting: Internal Medicine

## 2022-09-06 DIAGNOSIS — K5732 Diverticulitis of large intestine without perforation or abscess without bleeding: Secondary | ICD-10-CM | POA: Diagnosis not present

## 2022-09-06 LAB — CBC WITH DIFFERENTIAL/PLATELET
Abs Immature Granulocytes: 0.18 10*3/uL — ABNORMAL HIGH (ref 0.00–0.07)
Basophils Absolute: 0 10*3/uL (ref 0.0–0.1)
Basophils Relative: 0 %
Eosinophils Absolute: 0.1 10*3/uL (ref 0.0–0.5)
Eosinophils Relative: 1 %
HCT: 33.5 % — ABNORMAL LOW (ref 36.0–46.0)
Hemoglobin: 11.1 g/dL — ABNORMAL LOW (ref 12.0–15.0)
Immature Granulocytes: 2 %
Lymphocytes Relative: 11 %
Lymphs Abs: 1.1 10*3/uL (ref 0.7–4.0)
MCH: 28.1 pg (ref 26.0–34.0)
MCHC: 33.1 g/dL (ref 30.0–36.0)
MCV: 84.8 fL (ref 80.0–100.0)
Monocytes Absolute: 1 10*3/uL (ref 0.1–1.0)
Monocytes Relative: 11 %
Neutro Abs: 7.3 10*3/uL (ref 1.7–7.7)
Neutrophils Relative %: 75 %
Platelets: 367 10*3/uL (ref 150–400)
RBC: 3.95 MIL/uL (ref 3.87–5.11)
RDW: 14.3 % (ref 11.5–15.5)
WBC: 9.7 10*3/uL (ref 4.0–10.5)
nRBC: 0 % (ref 0.0–0.2)

## 2022-09-06 LAB — BASIC METABOLIC PANEL
Anion gap: 7 (ref 5–15)
BUN: 6 mg/dL — ABNORMAL LOW (ref 8–23)
CO2: 27 mmol/L (ref 22–32)
Calcium: 8.7 mg/dL — ABNORMAL LOW (ref 8.9–10.3)
Chloride: 100 mmol/L (ref 98–111)
Creatinine, Ser: 0.63 mg/dL (ref 0.44–1.00)
GFR, Estimated: >60 mL/min (ref >60)
Glucose, Bld: 132 mg/dL — ABNORMAL HIGH (ref 70–99)
Potassium: 3.1 mmol/L — ABNORMAL LOW (ref 3.5–5.1)
Sodium: 134 mmol/L — ABNORMAL LOW (ref 135–145)

## 2022-09-06 LAB — PROTIME-INR
INR: 1.3 — ABNORMAL HIGH (ref 0.8–1.2)
Prothrombin Time: 16.2 seconds — ABNORMAL HIGH (ref 11.4–15.2)

## 2022-09-06 LAB — PHOSPHORUS: Phosphorus: 2.7 mg/dL (ref 2.5–4.6)

## 2022-09-06 LAB — MAGNESIUM: Magnesium: 1.5 mg/dL — ABNORMAL LOW (ref 1.7–2.4)

## 2022-09-06 MED ORDER — FENTANYL CITRATE (PF) 100 MCG/2ML IJ SOLN
INTRAMUSCULAR | Status: AC | PRN
  Administered 2022-09-06 (×2): 50 ug via INTRAVENOUS

## 2022-09-06 MED ORDER — POTASSIUM CHLORIDE 10 MEQ/100ML IV SOLN
INTRAVENOUS | Status: AC
  Filled 2022-09-06: qty 100

## 2022-09-06 MED ORDER — FENTANYL CITRATE (PF) 100 MCG/2ML IJ SOLN
INTRAMUSCULAR | Status: AC
  Filled 2022-09-06: qty 2

## 2022-09-06 MED ORDER — POTASSIUM CHLORIDE 10 MEQ/100ML IV SOLN
10.0000 meq | INTRAVENOUS | Status: AC
  Administered 2022-09-06 (×6): 10 meq via INTRAVENOUS
  Filled 2022-09-06 (×5): qty 100

## 2022-09-06 MED ORDER — SODIUM CHLORIDE 0.9% FLUSH
5.0000 mL | Freq: Three times a day (TID) | INTRAVENOUS | Status: DC
  Administered 2022-09-06 – 2022-09-20 (×31): 5 mL

## 2022-09-06 MED ORDER — ENOXAPARIN SODIUM 40 MG/0.4ML IJ SOSY
40.0000 mg | PREFILLED_SYRINGE | INTRAMUSCULAR | Status: DC
  Administered 2022-09-06: 40 mg via SUBCUTANEOUS
  Filled 2022-09-06: qty 0.4

## 2022-09-06 MED ORDER — MAGNESIUM SULFATE 4 GM/100ML IV SOLN
4.0000 g | Freq: Once | INTRAVENOUS | Status: AC
  Administered 2022-09-06: 4 g via INTRAVENOUS
  Filled 2022-09-06: qty 100

## 2022-09-06 MED ORDER — HYDROMORPHONE HCL 1 MG/ML IJ SOLN
1.0000 mg | Freq: Once | INTRAMUSCULAR | Status: AC
  Administered 2022-09-06: 1 mg via INTRAVENOUS
  Filled 2022-09-06: qty 1

## 2022-09-06 MED ORDER — MIDAZOLAM HCL 2 MG/2ML IJ SOLN
INTRAMUSCULAR | Status: AC
  Filled 2022-09-06: qty 4

## 2022-09-06 MED ORDER — PIPERACILLIN-TAZOBACTAM 3.375 G IVPB
3.3750 g | Freq: Three times a day (TID) | INTRAVENOUS | Status: DC
  Administered 2022-09-06 – 2022-09-11 (×15): 3.375 g via INTRAVENOUS
  Filled 2022-09-06 (×15): qty 50

## 2022-09-06 MED ORDER — LIDOCAINE HCL (PF) 1 % IJ SOLN
INTRAMUSCULAR | Status: AC | PRN
  Administered 2022-09-06: 20 mL

## 2022-09-06 MED ORDER — MIDAZOLAM HCL 2 MG/2ML IJ SOLN
INTRAMUSCULAR | Status: AC | PRN
  Administered 2022-09-06 (×2): 1 mg via INTRAVENOUS

## 2022-09-06 NOTE — Progress Notes (Signed)
PT Cancellation Note  Patient Details Name: Kristin Gibson MRN: [id number] DOB: 1956-01-17   Cancelled Treatment:     DRAINAGE CATHETER PLACEMENT into PELVIC ABSCESS today.  Will attempt to see another day as schedule permits.     Felecia Shelling  PTA Acute  Rehabilitation Services Office M-F          [phone] Weekend pager [phone]

## 2022-09-06 NOTE — Progress Notes (Signed)
Nutrition Follow-up  DOCUMENTATION CODES:   Obesity unspecified  INTERVENTION:   If diet unable to be advanced, recommend TPN initiation.  Pt will be at refeeding syndrome risk. Monitor magnesium, potassium, and phosphorus BID for at least 3 days, MD to replete as needed, as pt is at risk for refeeding syndrome.  Given continued intolerance of PO and N/V, recommend 100 mg Thiamine daily for 5 days.  -Will continue to monitor plan and resume supplements when appropriate  NUTRITION DIAGNOSIS:   Increased nutrient needs related to post-op healing (h/o gastric sleeve) as evidenced by estimated needs.  Ongoing.  GOAL:   Patient will meet greater than or equal to 90% of their needs  Not meeting. NPO  MONITOR:   PO intake, Supplement acceptance, Labs, Weight trends, I & O's  REASON FOR ASSESSMENT:   Consult Assessment of nutrition requirement/status  ASSESSMENT:   66 year old female with history of gastric sleeve surgery for weight loss February 2093, recurrent diverticulitis, recently completed 2 rounds of antibiotics presented with worsening abdominal pain with fever and chills. She was admitted with sigmoid diverticulitis and started on iv antibiotics.  8/29: admitted, NPO 9/1: CLD 9/2: NPO 9/3: CLD 9/5: FLD 9/7: NPO 9/8: s/p ex lap, sig colectomy, colostomy, VAC 9/9: CLD -> [id number] 9/13: NPO  Patient currently NPO d/t inability to tolerate any POs, continued N/V.  IR to place drain today.  Would benefit from TPN if diet is unable to be advanced. Pt will be at refeeding risk given prolonged time without consistent nutrition.   Admission weight: 248 lbs Last weight 9/8: 247 lbs  Medications: Bariatric MVI chewable, D5 infusion, IV Mg sulfate, KCl, Compazine  Labs reviewed:g Low Na Low K Low Mg   Diet Order:   Diet Order             Diet NPO time specified Except for: Sips with Meds  Diet effective now                   EDUCATION NEEDS:    No education needs have been identified at this time  Skin:  Skin Assessment: Skin Integrity Issues: Skin Integrity Issues:: Incisions Incisions: 9/8 abdomen  Last BM:  9/12 -colostomy  Height:   Ht Readings from Last 1 Encounters:  08/31/22 5\' 9"  (1.753 m)    Weight:   Wt Readings from Last 1 Encounters:  08/31/22 112 kg    BMI:  Body mass index is 36.48 kg/m.  Estimated Nutritional Needs:   Kcal:  1700-1900  Protein:  95-105g  Fluid:  1.9L/day  10/31/22, MS, RD, LDN Inpatient Clinical Dietitian Contact information available via Amion

## 2022-09-06 NOTE — Progress Notes (Signed)
PROGRESS NOTE    Kristin Gibson  XNA:[id number] DOB: 1956-04-24 DOA: 08/21/2022 PCP: Woodroe Chen, MD    Brief Narrative:   Kristin Gibson is a 66 y.o. female with past medical history significant for morbid obesity s/p gastric sleeve surgery February 2023, recurrent diverticulitis in which she recently completed 2 rounds of antibiotics who presented to [facility] ED on 8/29 with progressive/recurrent abdominal pain, fever/chills.  Work-up in the ED notable for recurrent sigmoid diverticulitis.  Patient was started on IV antibiotics.  General surgery was consulted.  Initially admitted to the hospital service.  Patient underwent exploratory laparotomy, sigmoid colectomy, descending colostomy with wound VAC placement by Dr. Gerrit Friends on 08/31/2022.  Care was transferred to the general surgery service on 09/03/2022.  Assessment & Plan:   Recurrent sigmoid diverticulitis Postoperative pelvic abscess Patient presenting to ED with progressive abdominal pain associate with fever/chills.  Work-up revealing recurrent sigmoid diverticulitis.  Patient recently completed 2 courses of outpatient antibiotics which she has failed.  General surgery was consulted and patient underwent exploratory laparotomy with sigmoid colectomy, descending colostomy with wound VAC placement by Dr. Gerrit Friends on 08/31/2022.  Repeat CT abdomen/pelvis with contrast 9/13 notable for central pelvic fluid collection measuring 6.3 cm with thin peripheral enhancement consistent with early abscess and additional smaller peritoneal fluid collections, no free air or antibiotics static breakdown, no bowel obstruction or ileus. --Completed course of antibiotics with cefepime/metronidazole --D5 half NS with 20 mEq KCL at 153mL/h --IR for drain placement today --Wound care, pain control, diet advancement per general surgery  Essential hypertension -- Amlodipine 10 mg p.o. daily -- Clonidine 0.1 mg p.o. qPM -- Hydralazine 5 mg IV q4h PRN SBP  >170 or DBP >95  GERD -- Protonix 40 mg IV daily  Hypokalemia Hypomagnesemia Potassium 3.1 and magnesium 1.5 this am.  Will replete IV Kcl and IV magnesium --Repeat electrolytes in the a.m.   Morbid obesity Body mass index is 36.48 kg/m.  Discussed with patient needs for aggressive lifestyle changes/weight loss as this complicates all facets of care.  Outpatient follow-up with PCP/bariatric surgery.   DVT prophylaxis: enoxaparin (LOVENOX) injection 40 mg Start: 09/06/22 2200 SCD's Start: 08/30/22 1310    Code Status: Full Code Family Communication: Updated multiple family members present at bedside this morning  Disposition Plan:  Level of care: Med-Surg Status is: Inpatient Remains inpatient appropriate because: Per primary, general surgery; but will need further diet advancement before stable for discharge home     Procedures:  Exploratory laparotomy with sigmoid colectomy, descending colostomy with wound VAC placement, Dr. Gerrit Friends on 08/31/2022.   Antimicrobials:  Ceftriaxone 8/29 - 8/2 Metronidazole 8/29 - 9/12 Cefepime 9/3 - 9/12   Subjective: Patient seen examined bedside, resting comfortably.  Lying in bed.  Daughter present.  Daughter reports that patient was more comfortable and interactive this morning.  Wanting to work with physical therapy.  CT yesterday shows developing pelvic abscess in which IR was consulted for drain placement today.    Patient with no other complaints or concerns at this time.  Denies headache, no dizziness, no chest pain, no palpitations, no shortness of breath, no fever/chills/night sweats, no nausea/vomiting, no focal weakness, no fatigue, no cough/congestion, no paresthesias.  No acute events overnight per nurse staff.  Objective: Vitals:   09/05/22 0531 09/05/22 1420 09/05/22 2057 09/06/22 0501  BP: (!) 147/92 (!) 149/89 (!) 141/88 (!) 154/94  Pulse: 97 92 97 92  Resp: 19  20 20   Temp: 98.9 F (37.2 C) 98.7  F (37.1 C) 98.9 F (37.2  C) 98.4 F (36.9 C)  TempSrc: Oral Oral Oral Oral  SpO2: 94% 93% 95% 95%  Weight:      Height:        Intake/Output Summary (Last 24 hours) at 09/06/2022 1044 Last data filed at 09/06/2022 0536 Gross per 24 hour  Intake 1424.73 ml  Output 2152 ml  Net -727.27 ml   Filed Weights   08/21/22 2220 08/31/22 0815  Weight: 112.5 kg 112 kg    Examination:  Physical Exam: GEN: NAD, alert and oriented x 3, obese HEENT: NCAT, PERRL, EOMI, sclera clear, MMM PULM: CTAB w/o wheezes/crackles, normal respiratory effort, on room air CV: RRR w/o M/G/R GI: abd soft, mild TTP surrounding surgical site, slightly distended, wound VAC and ostomy noted with light pink drainage noted in ostomy bag GU: Foley catheter noted in place, draining clear yellow urine in collection bag MSK: no peripheral edema, moves all extremities independently NEURO: CN II-XII intact, no focal deficits, sensation to light touch intact PSYCH: normal mood/affect Integumentary: Surgical incision site noted with wound VAC in place, ostomy noted in place, otherwise no other concerning rashes/lesions/wounds on exposed areas     Data Reviewed: I have personally reviewed following labs and imaging studies  CBC: Recent Labs  Lab 08/31/22 0739 09/03/22 1012 09/04/22 0343 09/05/22 0351 09/06/22 0401  WBC 8.3 15.5* 13.3* 10.2 9.7  NEUTROABS  --  13.5* 11.0* 8.0* 7.3  HGB 11.7* 10.2* 9.6* 10.6* 11.1*  HCT 36.3 32.7* 30.2* 33.5* 33.5*  MCV 88.8 90.1 89.9 89.8 84.8  PLT 355 336 298 311 367   Basic Metabolic Panel: Recent Labs  Lab 08/31/22 0739 09/03/22 1012 09/04/22 0343 09/05/22 0351 09/06/22 0401  NA 136 138 137 136 134*  K 3.0* 3.2* 3.6 2.9* 3.1*  CL 103 109 107 104 100  CO2 24 23 25 24 27   GLUCOSE 93 110* 100* 122* 132*  BUN 8 16 13  7* 6*  CREATININE 0.70 0.86 0.79 0.74 0.63  CALCIUM 8.8* 8.8* 8.2* 8.3* 8.7*  MG  --  2.1  --   --  1.5*  PHOS  --   --   --   --  2.7   GFR: Estimated Creatinine  Clearance: 92.3 mL/min (by C-G formula based on SCr of 0.63 mg/dL). Liver Function Tests: No results for input(s): "AST", "ALT", "ALKPHOS", "BILITOT", "PROT", "ALBUMIN" in the last 168 hours. No results for input(s): "LIPASE", "AMYLASE" in the last 168 hours. No results for input(s): "AMMONIA" in the last 168 hours. Coagulation Profile: Recent Labs  Lab 09/06/22 0401  INR 1.3*   Cardiac Enzymes: No results for input(s): "CKTOTAL", "CKMB", "CKMBINDEX", "TROPONINI" in the last 168 hours. BNP (last 3 results) No results for input(s): "PROBNP" in the last 8760 hours. HbA1C: No results for input(s): "HGBA1C" in the last 72 hours. CBG: No results for input(s): "GLUCAP" in the last 168 hours. Lipid Profile: No results for input(s): "CHOL", "HDL", "LDLCALC", "TRIG", "CHOLHDL", "LDLDIRECT" in the last 72 hours. Thyroid Function Tests: No results for input(s): "TSH", "T4TOTAL", "FREET4", "T3FREE", "THYROIDAB" in the last 72 hours. Anemia Panel: No results for input(s): "VITAMINB12", "FOLATE", "FERRITIN", "TIBC", "IRON", "RETICCTPCT" in the last 72 hours. Sepsis Labs: No results for input(s): "PROCALCITON", "LATICACIDVEN" in the last 168 hours.  No results found for this or any previous visit (from the past 240 hour(s)).       Radiology Studies: CT ABDOMEN PELVIS W CONTRAST  Result Date: 09/05/2022 CLINICAL DATA:  Postop partial colectomy and colostomy formation 08/31/2022. Evaluate for bowel obstruction versus postoperative ileus. EXAM: CT ABDOMEN AND PELVIS WITH CONTRAST TECHNIQUE: Multidetector CT imaging of the abdomen and pelvis was performed using the standard protocol following bolus administration of intravenous contrast. RADIATION DOSE REDUCTION: This exam was performed according to the departmental dose-optimization program which includes automated exposure control, adjustment of the mA and/or kV according to patient size and/or use of iterative reconstruction technique. CONTRAST:   OMNIPAQUE IOHEXOL 300 MG/ML  SOLN COMPARISON:  Abdominopelvic CT 08/26/2022 and 08/22/2022. FINDINGS: Note: Contrast bolus is suboptimal. Lower chest: Trace pleural effusions with mild bibasilar atelectasis. No confluent airspace opacity or basilar pneumothorax. The heart is mildly enlarged. There is a moderate pericardial effusion which appears similar to the previous study. Hepatobiliary: The liver is normal in density without suspicious focal abnormality. No evidence of gallstones, gallbladder wall thickening or biliary dilatation. Pancreas: Unremarkable. No pancreatic ductal dilatation or surrounding inflammatory changes. Spleen: Normal in size without focal abnormality. Adrenals/Urinary Tract: Both adrenal glands appear normal. No evidence of urinary tract calculus, suspicious renal lesion or hydronephrosis. Bilateral renal cysts are grossly stable and do not require any specific imaging follow-up. The urinary bladder is decompressed by a Foley catheter. Stomach/Bowel: No enteric contrast administered. There are postsurgical changes at the GE junction and along the greater curvature of the stomach, the latter suggesting previous gastric sleeve resection. The stomach is decompressed. There is mild bowel dilatation at the duodenal-jejunal junction. No other significant small or large bowel distention identified. There is a descending colostomy in the left lower quadrant anterior abdominal wall and a long segment Hartman pouch. Vascular/Lymphatic: Multiple prominent retroperitoneal and base of mesentery lymph nodes are similar to the preoperative study. There is aortic and branch vessel atherosclerosis without evidence of aneurysm or large vessel occlusion. Multiple retroperitoneal phleboliths are present bilaterally. Reproductive: Hysterectomy.  No adnexal mass. Other: As above, postsurgical changes in the anterior abdominal wall with a descending colostomy. Midline abdominal incision appears incompletely  closed. Rounded fluid collection in the pelvis anterior to the rectum measures up to 6.3 x 4.6 cm on image 88/2 and demonstrates thin peripheral enhancement. There are other smaller fluid collections in both pericolic gutters measuring up to 3.5 cm on the right (coronal image 34/6) and 2.3 cm on the left (axial image 56/2). No free air. Musculoskeletal: No acute or significant osseous findings. Thoracolumbar spondylosis. IMPRESSION: 1. Postsurgical changes from interval sigmoidectomy and descending colostomy. Central pelvic fluid collection measuring up to 6.3 cm in diameter demonstrates thin peripheral enhancement and could reflect an early abscess. There are additional smaller peritoneal fluid collections. No evidence of free air or anastomotic breakdown. 2. No evidence of bowel obstruction or significant ileus. 3. Small bilateral pleural effusions with bibasilar atelectasis. Stable moderate-sized pericardial effusion. 4. Pre-existing postsurgical changes at the GE junction and stomach consistent with gastric sleeve resection. Unchanged prominent retroperitoneal lymph nodes, likely reactive. Electronically Signed   By: Carey Bullocks M.D.   On: 09/05/2022 15:46        Scheduled Meds:  acetaminophen  1,000 mg Oral Q6H   amLODipine  10 mg Oral Q supper   bariatric multiple vitamin  2 tablet Oral Daily   cloNIDine  0.1 mg Oral Q supper   enoxaparin (LOVENOX) injection  40 mg Subcutaneous Q24H   feeding supplement  237 mL Oral BID BM   gabapentin  300 mg Oral TID   lip balm   Topical BID   pantoprazole (PROTONIX) IV  40 mg Intravenous QHS   sodium chloride flush  3 mL Intravenous Q12H   Continuous Infusions:  sodium chloride     dextrose 5 % and 0.45 % NaCl with KCl 20 mEq/L 100 mL/hr at 09/06/22 0536   magnesium sulfate bolus IVPB 4 g (09/06/22 0847)   methocarbamol (ROBAXIN) IV 1,000 mg (09/06/22 0114)   ondansetron (ZOFRAN) IV     potassium chloride       LOS: 15 days    Time spent:  51 minutes spent on chart review, discussion with nursing staff, consultants, updating family and interview/physical exam; more than 50% of that time was spent in counseling and/or coordination of care.    Alvira Philips [place], DO [facility] Available via Epic secure chat 7am-7pm After these hours, please refer to coverage provider listed on [email or web address] 09/06/2022, 10:44 AM

## 2022-09-06 NOTE — Consult Note (Addendum)
Chief Complaint: Patient was seen in consultation today for pelvic abscess  at the request of Hosie Spangle, [place]  Referring Physician(s): Hosie Spangle, [place]  Supervising Physician: Roanna Banning  Patient Status: [facility] - In-pt  History of Present Illness: Kristin Gibson is a 66 y.o. female patient presented to ED 08/21/2022 with progressive/recurrent abdominal pains, fever/chills.  Work-up in the ED was notable for recurrent sigmoid diverticulitis.  Patient underwent exploratory laparotomy, sigmoid colectomy, descending colostomy with Dr. Gerrit Friends on 08/31/2022.  Patient underwent repeat CT AP to evaluate for bowel obstruction versus postoperative ileus and found central pelvic fluid collection measuring up to 6.3 cm.  Patient has been referred to IR for pelvic abscess drain placement.  Procedure was approved by Dr. Milford Cage.   Past Medical History:  Diagnosis Date   Arthritis    Back pain    Diverticulitis    GERD (gastroesophageal reflux disease)    Hypertension    Knee pain, chronic    PONV (postoperative nausea and vomiting)    Pre-diabetes    Sleep apnea    cpap    Past Surgical History:  Procedure Laterality Date   ABDOMINAL HYSTERECTOMY     BREAST REDUCTION SURGERY     KNEE SURGERY     LAPAROSCOPIC GASTRIC SLEEVE RESECTION N/A 02/19/2022   Procedure: LAPAROSCOPIC GASTRIC SLEEVE RESECTION, HIATAL HERNIA REPAIR;  Surgeon: Berna Bue, MD;  Location: [facility];  Service: General;  Laterality: N/A;   PARTIAL COLECTOMY N/A 08/31/2022   Procedure: OPEN PARTIAL COLECTOMY WITH COLOSTOMY;  Surgeon: Darnell Level, MD;  Location: [facility];  Service: General;  Laterality: N/A;   right knee replacement      TUBAL LIGATION     UPPER GI ENDOSCOPY N/A 02/19/2022   Procedure: UPPER GI ENDOSCOPY;  Surgeon: Berna Bue, MD;  Location: [facility];  Service: General;  Laterality: N/A;    Allergies: Benazepril, Sulfa antibiotics, and Elemental sulfur  Medications: Prior to  Admission medications   Medication Sig Start Date End Date Taking? Authorizing Provider  acetaminophen (TYLENOL) 500 MG tablet Take 1,000 mg by mouth daily as needed for mild pain. pain    Yes [provider]  amLODipine (NORVASC) 10 MG tablet Take 10 mg by mouth daily with supper. 08/08/17  Yes [provider]  amoxicillin (AMOXIL) 500 MG capsule Take 2,000 mg by mouth once.   Yes [provider]  amoxicillin-clavulanate (AUGMENTIN) 875-125 MG tablet Take 1 tablet by mouth 2 (two) times daily. 08/17/22  Yes [provider]  cloNIDine (CATAPRES) 0.1 MG tablet Take 0.1 mg by mouth daily with supper. 10/13/18  Yes [provider]  dicyclomine (BENTYL) 20 MG tablet Take 20 mg by mouth every 8 (eight) hours. 12/05/16  Yes [provider]  methocarbamol (ROBAXIN) 500 MG tablet Take 500 mg by mouth every 8 (eight) hours as needed for muscle spasms.   Yes [provider]  Multiple Vitamin (MULTIVITAMIN) tablet Take 1 tablet by mouth daily.   Yes [provider]  ondansetron (ZOFRAN-ODT) 4 MG disintegrating tablet Take 1 tablet (4 mg total) by mouth every 6 (six) hours as needed for nausea or vomiting. 02/20/22  Yes Berna Bue, MD  pantoprazole (PROTONIX) 40 MG tablet Take 1 tablet (40 mg total) by mouth daily. 02/20/22  Yes Berna Bue, MD  polyethylene glycol (MIRALAX / GLYCOLAX) 17 g packet Take 17 g by mouth daily as needed (constipation).   Yes [provider]  Probiotic CAPS Take  1 capsule by mouth daily. 06/28/20  Yes Ward, Layla Maw, DO  tizanidine (ZANAFLEX) 2 MG capsule Take 2 mg by mouth every 8 (eight) hours as needed for muscle spasms.   Yes [provider]  traMADol (ULTRAM) 50 MG tablet Take 1 tablet (50 mg total) by mouth every 6 (six) hours as needed (pain). 02/20/22  Yes Berna Bue, MD  gabapentin (NEURONTIN) 100 MG capsule Take 2 capsules (200 mg total) by mouth every 12 (twelve)  hours. Patient not taking: Reported on 08/22/2022 02/20/22   Berna Bue, MD     Family History  Problem Relation Age of Onset   Hypertension Mother    Cancer Sister     Social History   Socioeconomic History   Marital status: Divorced    Spouse name: Not on file   Number of children: Not on file   Years of education: Not on file   Highest education level: Not on file  Occupational History   Not on file  Tobacco Use   Smoking status: Former   Smokeless tobacco: Never  Vaping Use   Vaping Use: Never used  Substance and Sexual Activity   Alcohol use: Yes    Comment: occasionally   Drug use: No   Sexual activity: Yes    Birth control/protection: Surgical  Other Topics Concern   Not on file  Social History Narrative   Not on file   Social Determinants of Health   Financial Resource Strain: Not on file  Food Insecurity: No Food Insecurity (09/03/2022)   Hunger Vital Sign    Worried About Running Out of Food in the Last Year: Never true    Ran Out of Food in the Last Year: Never true  Transportation Needs: No Transportation Needs (09/03/2022)   PRAPARE - Administrator, Civil Service (Medical): No    Lack of Transportation (Non-Medical): No  Physical Activity: Not on file  Stress: Not on file  Social Connections: Not on file   Review of Systems: A 12 point ROS discussed and pertinent positives are indicated in the HPI above.  All other systems are negative.  Review of Systems  Constitutional:  Negative for chills and fever.  Respiratory:  Negative for shortness of breath.   Cardiovascular:  Negative for chest pain.  Gastrointestinal:  Positive for abdominal pain, nausea and vomiting.  Musculoskeletal:  Positive for back pain.    Vital Signs: BP (!) 154/94 (BP Location: Right Arm)   Pulse 92   Temp 98.4 F (36.9 C) (Oral)   Resp 20   Ht 5\' 9"  (1.753 m)   Wt 247 lb (112 kg)   SpO2 95%   BMI 36.48 kg/m     Physical Exam Vitals reviewed.   Constitutional:      General: She is not in acute distress.    Appearance: Normal appearance.  HENT:     Head: Normocephalic and atraumatic.     Mouth/Throat:     Mouth: Mucous membranes are dry.     Pharynx: Oropharynx is clear.  Cardiovascular:     Rate and Rhythm: Normal rate and regular rhythm.     Pulses: Normal pulses.     Heart sounds: Normal heart sounds.  Pulmonary:     Effort: Pulmonary effort is normal. No respiratory distress.     Comments: Diminished at bases bilaterally Abdominal:     General: Bowel sounds are normal.     Palpations: Abdomen is soft.  Comments: Midline wound vac Left ostomy   Musculoskeletal:     Right lower leg: No edema.     Left lower leg: No edema.  Neurological:     Mental Status: She is alert and oriented to person, place, and time.  Psychiatric:        Mood and Affect: Mood normal.        Behavior: Behavior normal.        Thought Content: Thought content normal.        Judgment: Judgment normal.     Imaging: CT ABDOMEN PELVIS W CONTRAST  Result Date: 09/05/2022 CLINICAL DATA:  Postop partial colectomy and colostomy formation 08/31/2022. Evaluate for bowel obstruction versus postoperative ileus. EXAM: CT ABDOMEN AND PELVIS WITH CONTRAST TECHNIQUE: Multidetector CT imaging of the abdomen and pelvis was performed using the standard protocol following bolus administration of intravenous contrast. RADIATION DOSE REDUCTION: This exam was performed according to the departmental dose-optimization program which includes automated exposure control, adjustment of the mA and/or kV according to patient size and/or use of iterative reconstruction technique. CONTRAST:  100mL OMNIPAQUE IOHEXOL 300 MG/ML  SOLN COMPARISON:  Abdominopelvic CT 08/26/2022 and 08/22/2022. FINDINGS: Note: Contrast bolus is suboptimal. Lower chest: Trace pleural effusions with mild bibasilar atelectasis. No confluent airspace opacity or basilar pneumothorax. The heart is mildly  enlarged. There is a moderate pericardial effusion which appears similar to the previous study. Hepatobiliary: The liver is normal in density without suspicious focal abnormality. No evidence of gallstones, gallbladder wall thickening or biliary dilatation. Pancreas: Unremarkable. No pancreatic ductal dilatation or surrounding inflammatory changes. Spleen: Normal in size without focal abnormality. Adrenals/Urinary Tract: Both adrenal glands appear normal. No evidence of urinary tract calculus, suspicious renal lesion or hydronephrosis. Bilateral renal cysts are grossly stable and do not require any specific imaging follow-up. The urinary bladder is decompressed by a Foley catheter. Stomach/Bowel: No enteric contrast administered. There are postsurgical changes at the GE junction and along the greater curvature of the stomach, the latter suggesting previous gastric sleeve resection. The stomach is decompressed. There is mild bowel dilatation at the duodenal-jejunal junction. No other significant small or large bowel distention identified. There is a descending colostomy in the left lower quadrant anterior abdominal wall and a long segment Hartman pouch. Vascular/Lymphatic: Multiple prominent retroperitoneal and base of mesentery lymph nodes are similar to the preoperative study. There is aortic and branch vessel atherosclerosis without evidence of aneurysm or large vessel occlusion. Multiple retroperitoneal phleboliths are present bilaterally. Reproductive: Hysterectomy.  No adnexal mass. Other: As above, postsurgical changes in the anterior abdominal wall with a descending colostomy. Midline abdominal incision appears incompletely closed. Rounded fluid collection in the pelvis anterior to the rectum measures up to 6.3 x 4.6 cm on image 88/2 and demonstrates thin peripheral enhancement. There are other smaller fluid collections in both pericolic gutters measuring up to 3.5 cm on the right (coronal image 34/6) and 2.3  cm on the left (axial image 56/2). No free air. Musculoskeletal: No acute or significant osseous findings. Thoracolumbar spondylosis. IMPRESSION: 1. Postsurgical changes from interval sigmoidectomy and descending colostomy. Central pelvic fluid collection measuring up to 6.3 cm in diameter demonstrates thin peripheral enhancement and could reflect an early abscess. There are additional smaller peritoneal fluid collections. No evidence of free air or anastomotic breakdown. 2. No evidence of bowel obstruction or significant ileus. 3. Small bilateral pleural effusions with bibasilar atelectasis. Stable moderate-sized pericardial effusion. 4. Pre-existing postsurgical changes at the GE junction and stomach consistent with gastric  sleeve resection. Unchanged prominent retroperitoneal lymph nodes, likely reactive. Electronically Signed   By: Carey Bullocks M.D.   On: 09/05/2022 15:46   DG CHEST PORT 1 VIEW  Result Date: 09/02/2022 CLINICAL DATA:  Fever EXAM: PORTABLE CHEST 1 VIEW COMPARISON:  02/17/2021 FINDINGS: Mild cardiomegaly. No confluent airspace opacities or effusions. No acute bony abnormality. IMPRESSION: No active disease. Electronically Signed   By: Charlett Nose M.D.   On: 09/02/2022 19:18   CT ABDOMEN PELVIS W CONTRAST  Result Date: 08/26/2022 CLINICAL DATA:  Nausea and vomiting.  Left lower quadrant pain. EXAM: CT ABDOMEN AND PELVIS WITH CONTRAST TECHNIQUE: Multidetector CT imaging of the abdomen and pelvis was performed using the standard protocol following bolus administration of intravenous contrast. RADIATION DOSE REDUCTION: This exam was performed according to the departmental dose-optimization program which includes automated exposure control, adjustment of the mA and/or kV according to patient size and/or use of iterative reconstruction technique. CONTRAST:  OMNIPAQUE IOHEXOL 300 MG/ML  SOLN COMPARISON:  08/22/2022 FINDINGS: Lower chest: Unremarkable. Hepatobiliary: No focal liver  abnormality is seen. No gallstones, gallbladder wall thickening, or biliary dilatation. Pancreas: Unremarkable. No pancreatic ductal dilatation or surrounding inflammatory changes. Spleen: Normal in size without focal abnormality. Adrenals/Urinary Tract: No adrenal nodule or mass. Stable bilateral renal cysts measuring up to 3.4 cm in the right kidney 2.2 cm left kidney. No hydronephrosis. No evidence for hydroureter. The urinary bladder appears normal for the degree of distention. Stomach/Bowel: Surgical changes again noted in the stomach. Duodenum is normally positioned as is the ligament of Treitz. No small bowel wall thickening. No small bowel dilatation. Cecum, ascending colon and transverse colon are distended with fluid. Descending colon also mildly distended and fluid-filled. There is marked circumferential wall thickening in the proximal sigmoid colon with pericolonic edema/inflammation in a background of left colonic diverticulosis. Imaging features may reflect diverticulitis although a relatively long segment of the sigmoid colon is involved and infectious/inflammatory colitis cannot be excluded. There is edema and inflammation in the sigmoid mesocolon without evidence for extraluminal gas or abscess. Vascular/Lymphatic: There is mild atherosclerotic calcification of the abdominal aorta without aneurysm. There is no gastrohepatic or hepatoduodenal ligament lymphadenopathy. No retroperitoneal or mesenteric lymphadenopathy. No pelvic sidewall lymphadenopathy. Increased number of lymph nodes are seen in the retroperitoneal space and upper pelvic sidewall bilaterally, likely reactive. Reproductive: Uterus surgically absent.  There is no adnexal mass. Other: No intraperitoneal free fluid. Musculoskeletal: No worrisome lytic or sclerotic osseous abnormality. IMPRESSION: 1. Marked circumferential wall thickening in the proximal sigmoid colon with pericolonic edema/inflammation in a background of left colonic  diverticulosis. Findings are progressive since the 08/22/2022 exam and while imaging features may reflect diverticulitis, a relatively long segment of the sigmoid colon is involved and infectious/inflammatory colitis is also a consideration. Neoplasm is not entirely excluded. No evidence for extraluminal gas or abscess. 2. Increasing distension and increasing stool volume in the colon proximal to the sigmoid segment. While this may be functional due to the changes in the sigmoid colon, stricture/mechanical obstruction cannot be excluded. 3. Increased number of lymph nodes in the retroperitoneal space and upper pelvic sidewall bilaterally, likely reactive. 4. Bilateral renal cysts. 5. Aortic Atherosclerosis (ICD10-I70.0). Increasing Electronically Signed   By: Kennith Center M.D.   On: 08/26/2022 05:25   DG Abd Portable 2V  Result Date: 08/25/2022 CLINICAL DATA:  Abdominal pain.  Diverticulitis on recent CT. EXAM: PORTABLE ABDOMEN - 2 VIEW COMPARISON:  CT 08/22/2022 FINDINGS: Divided portable semi upright views of the abdomen  obtained. There is no obvious free intra-abdominal air. Increasing gaseous distension of transverse colon. Air-filled loop of bowel in the left abdomen, unclear if this represents descending colon or small bowel, measures 3.8 cm. There is an adjacent air-filled loops of prominent small bowel. 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 There are multiple phleboliths in the abdomen when compared to prior CT. Soft tissue attenuation from habitus limits detailed assessment. IMPRESSION: 1. Increasing gaseous distension of transverse colon, suggesting colonic ileus. 2. Air-filled loop of bowel in the left abdomen, unclear if this represents descending colon or small bowel. 3. No obvious free air. Electronically Signed   By: Narda Rutherford M.D.   On: 08/25/2022 16:54   CT ABDOMEN PELVIS W CONTRAST  Result Date: 08/22/2022 CLINICAL DATA:  Left lower quadrant abdominal  pain. EXAM: CT ABDOMEN AND PELVIS WITH CONTRAST TECHNIQUE: Multidetector CT imaging of the abdomen and pelvis was performed using the standard protocol following bolus administration of intravenous contrast. RADIATION DOSE REDUCTION: This exam was performed according to the departmental dose-optimization program which includes automated exposure control, adjustment of the mA and/or kV according to patient size and/or use of iterative reconstruction technique. CONTRAST:  OMNIPAQUE IOHEXOL 300 MG/ML  SOLN COMPARISON:  06/28/2020. FINDINGS: Lower chest: The heart is normal in size and there is a small pericardial effusion. Mild atelectasis or scarring is present at the lung bases. Hepatobiliary: No focal liver abnormality is seen. No gallstones, gallbladder wall thickening, or biliary dilatation. Pancreas: Unremarkable. No pancreatic ductal dilatation or surrounding inflammatory changes. Spleen: Normal in size without focal abnormality. Adrenals/Urinary Tract: No adrenal nodule or mass. The kidneys enhance symmetrically. Multiple renal cysts are noted bilaterally. No renal calculus or hydronephrosis. The bladder is unremarkable. Stomach/Bowel: There is a small hiatal hernia. Gastric surgery changes are noted. A moderate amount of retained stool is present in the colon. Multiple scattered diverticula are present along the sigmoid colon with bowel wall thickening and surrounding fat stranding, with interval worsening in appearance from the prior exam. No free air or abscess is seen. No bowel obstruction. The appendix is not seen. Vascular/Lymphatic: Aortic atherosclerotic asses. Prominent lymph nodes are noted in the periaortic space and in the mesentery in the pelvis which are new from the previous exam and are likely reactive. Reproductive: Status post hysterectomy. No adnexal masses. Other: No ascites. Musculoskeletal: Degenerative changes are present in the thoracolumbar spine. Sclerosis is seen at the  sacroiliac joints bilaterally, compatible with sacroiliitis. No acute osseous abnormality. IMPRESSION: 1. Interval worsening of sigmoid diverticulitis. No free air or abscess. 2. Interval development of prominent lymph nodes in the retroperitoneum and mesentery in the pelvis, likely reactive. 3. Moderate amount of retained stool in the colon suggesting constipation. 4. Aortic atherosclerosis. 5. Remaining incidental findings as described above. Electronically Signed   By: Thornell Sartorius M.D.   On: 08/22/2022 02:04    Labs:  CBC: Recent Labs    09/03/22 1012 09/04/22 0343 09/05/22 0351 09/06/22 0401  WBC 15.5* 13.3* 10.2 9.7  HGB 10.2* 9.6* 10.6* 11.1*  HCT 32.7* 30.2* 33.5* 33.5*  PLT 336 298 311 367    COAGS: Recent Labs    09/06/22 0401  INR 1.3*    BMP: Recent Labs    09/03/22 1012 09/04/22 0343 09/05/22 0351 09/06/22 0401  NA 138 137 136 134*  K 3.2* 3.6 2.9* 3.1*  CL 109 107 104 100  CO2 23 25 24 27   GLUCOSE 110* 100* 122* 132*  BUN  16 13 7* 6*  CALCIUM 8.8* 8.2* 8.3* 8.7*  CREATININE 0.86 0.79 0.74 0.63  GFRNONAA >60 >60 >60 >60    LIVER FUNCTION TESTS: Recent Labs    02/20/22 0404 08/21/22 2306 08/23/22 0418 08/24/22 0419  BILITOT 0.3 0.5 0.5 0.5  AST 14*  ALT ALKPHOS 47 55 50 48  PROT 7.7 7.9 7.0 7.0  ALBUMIN 3.8 3.3* 2.8* 2.8*    TUMOR MARKERS: No results for input(s): "AFPTM", "CEA", "CA199", "CHROMGRNA" in the last 8760 hours.  Assessment and Plan: 66 yo female with post-operative pelvic abscess presents to IR for pelvic drain placement.   Pt resting in bed. She is A&O.  She is in no distress.  Pt is NPO per order. Last Lovenox was 9/13 @ 1330.  Risks and benefits discussed with the patient including bleeding, infection, damage to adjacent structures, bowel perforation/fistula connection, and sepsis.  All of the patient's questions were answered, patient is agreeable to proceed. Consent signed and in chart.    Thank you for this interesting consult.  I greatly enjoyed meeting Alexandrina Fiorini Flanery and look forward to participating in their care.  A copy of this report was sent to the requesting provider on this date.  Electronically Signed: Shon Hough, NP 09/06/2022, 9:41 AM   I spent a total of 20 minutes in face to face in clinical consultation, greater than 50% of which was counseling/coordinating care for pelvic abscess.

## 2022-09-06 NOTE — Progress Notes (Signed)
6 Days Post-Op  Subjective: CC: Reports no abdominal pain currently as she just received IV pain medication. Most of her pain is around her incision and on the left lower portion of her abdomen normally and was up to a 7/10 this am. Notes ongoing nausea and vomiting with last episode of emesis around 3am. Did not get out of bed yesterday 2/2 n/v. Foley still in place with good uop. About to go down to IR.   Objective: Vital signs in last 24 hours: Temp:  [97.7 F (36.5 C)-98.9 F (37.2 C)] 97.7 F (36.5 C) (09/14 1121) Pulse Rate:  [92-97] 96 (09/14 1121) Resp:  [20] 20 (09/14 1121) BP: (141-154)/(88-94) 145/93 (09/14 1121) SpO2:  [93 %-96 %] 96 % (09/14 1121) Last BM Date : 09/01/22  Intake/Output from previous day: 09/13 0701 - 09/14 0700 In: 1424.7 [I.V.:1000; IV Piggyback:424.7] Out: 3552 [Urine:3500; Emesis/NG output:50; Drains:2] Intake/Output this shift: Total I/O In: -  Out: 650 [Urine:650]  PE: Gen:  Alert, NAD, pleasant Abd: Soft, mild distension, some llq and tenderness around her incisions. Hypoactive BS. Unable to visualize stoma through window in ostomy bag well - reviewed wocn from yesterday and appears stoma was viable appearing. Ostomy bag with scant sweat in bag. Midline wound with wound vac in place with good seal - scant output in cannister.   Lab Results:  Recent Labs    09/05/22 0351 09/06/22 0401  WBC 10.2 9.7  HGB 10.6* 11.1*  HCT 33.5* 33.5*  PLT 311 367   BMET Recent Labs    09/05/22 0351 09/06/22 0401  NA 136 134*  K 2.9* 3.1*  CL 104 100  CO2 24 27  GLUCOSE 122* 132*  BUN 7* 6*  CREATININE 0.74 0.63  CALCIUM 8.3* 8.7*   PT/INR Recent Labs    09/06/22 0401  LABPROT 16.2*  INR 1.3*   CMP     Component Value Date/Time   NA 134 (L) 09/06/2022 0401   K 3.1 (L) 09/06/2022 0401   CL 100 09/06/2022 0401   CO2 27 09/06/2022 0401   GLUCOSE 132 (H) 09/06/2022 0401   BUN 6 (L) 09/06/2022 0401   CREATININE 0.63 09/06/2022 0401    CALCIUM 8.7 (L) 09/06/2022 0401   PROT 7.0 08/24/2022 0419   ALBUMIN 2.8 (L) 08/24/2022 0419   AST 14 (L) 08/24/2022 0419   ALT 13 08/24/2022 0419   ALKPHOS 48 08/24/2022 0419   BILITOT 0.5 08/24/2022 0419   GFRNONAA >60 09/06/2022 0401   GFRAA >60 06/28/2020 0548   Lipase     Component Value Date/Time   LIPASE 26 08/21/2022 2306    Studies/Results: CT ABDOMEN PELVIS W CONTRAST  Result Date: 09/05/2022 CLINICAL DATA:  Postop partial colectomy and colostomy formation 08/31/2022. Evaluate for bowel obstruction versus postoperative ileus. EXAM: CT ABDOMEN AND PELVIS WITH CONTRAST TECHNIQUE: Multidetector CT imaging of the abdomen and pelvis was performed using the standard protocol following bolus administration of intravenous contrast. RADIATION DOSE REDUCTION: This exam was performed according to the departmental dose-optimization program which includes automated exposure control, adjustment of the mA and/or kV according to patient size and/or use of iterative reconstruction technique. CONTRAST:  OMNIPAQUE IOHEXOL 300 MG/ML  SOLN COMPARISON:  Abdominopelvic CT 08/26/2022 and 08/22/2022. FINDINGS: Note: Contrast bolus is suboptimal. Lower chest: Trace pleural effusions with mild bibasilar atelectasis. No confluent airspace opacity or basilar pneumothorax. The heart is mildly enlarged. There is a moderate pericardial effusion which appears similar to the previous study. Hepatobiliary:  The liver is normal in density without suspicious focal abnormality. No evidence of gallstones, gallbladder wall thickening or biliary dilatation. Pancreas: Unremarkable. No pancreatic ductal dilatation or surrounding inflammatory changes. Spleen: Normal in size without focal abnormality. Adrenals/Urinary Tract: Both adrenal glands appear normal. No evidence of urinary tract calculus, suspicious renal lesion or hydronephrosis. Bilateral renal cysts are grossly stable and do not require any specific imaging  follow-up. The urinary bladder is decompressed by a Foley catheter. Stomach/Bowel: No enteric contrast administered. There are postsurgical changes at the GE junction and along the greater curvature of the stomach, the latter suggesting previous gastric sleeve resection. The stomach is decompressed. There is mild bowel dilatation at the duodenal-jejunal junction. No other significant small or large bowel distention identified. There is a descending colostomy in the left lower quadrant anterior abdominal wall and a long segment Hartman pouch. Vascular/Lymphatic: Multiple prominent retroperitoneal and base of mesentery lymph nodes are similar to the preoperative study. There is aortic and branch vessel atherosclerosis without evidence of aneurysm or large vessel occlusion. Multiple retroperitoneal phleboliths are present bilaterally. Reproductive: Hysterectomy.  No adnexal mass. Other: As above, postsurgical changes in the anterior abdominal wall with a descending colostomy. Midline abdominal incision appears incompletely closed. Rounded fluid collection in the pelvis anterior to the rectum measures up to 6.3 x 4.6 cm on image 88/2 and demonstrates thin peripheral enhancement. There are other smaller fluid collections in both pericolic gutters measuring up to 3.5 cm on the right (coronal image 34/6) and 2.3 cm on the left (axial image 56/2). No free air. Musculoskeletal: No acute or significant osseous findings. Thoracolumbar spondylosis. IMPRESSION: 1. Postsurgical changes from interval sigmoidectomy and descending colostomy. Central pelvic fluid collection measuring up to 6.3 cm in diameter demonstrates thin peripheral enhancement and could reflect an early abscess. There are additional smaller peritoneal fluid collections. No evidence of free air or anastomotic breakdown. 2. No evidence of bowel obstruction or significant ileus. 3. Small bilateral pleural effusions with bibasilar atelectasis. Stable moderate-sized  pericardial effusion. 4. Pre-existing postsurgical changes at the GE junction and stomach consistent with gastric sleeve resection. Unchanged prominent retroperitoneal lymph nodes, likely reactive. Electronically Signed   By: Carey Bullocks M.D.   On: 09/05/2022 15:46    Anti-infectives: Anti-infectives (From admission, onward)    Start     Dose/Rate Route Frequency Ordered Stop   09/02/22 1400  ceFEPIme (MAXIPIME) 2 g in sodium chloride 0.9 % 100 mL IVPB        2 g 200 mL/hr over 30 Minutes Intravenous Every 8 hours 09/02/22 1015 09/05/22 0103   08/31/22 0600  cefoTEtan (CEFOTAN) 2 g in sodium chloride 0.9 % 100 mL IVPB  Status:  Discontinued        2 g 200 mL/hr over 30 Minutes Intravenous On call to O.R. 08/30/22 1316 08/31/22 1505   08/26/22 1400  ceFEPIme (MAXIPIME) 2 g in sodium chloride 0.9 % 100 mL IVPB  Status:  Discontinued        2 g 200 mL/hr over 30 Minutes Intravenous Every 8 hours 08/26/22 0848 09/02/22 1015   08/23/22 0400  cefTRIAXone (ROCEPHIN) 2 g in sodium chloride 0.9 % 100 mL IVPB  Status:  Discontinued        2 g 200 mL/hr over 30 Minutes Intravenous Every 24 hours 08/22/22 0345 08/26/22 0848   08/22/22 1400  metroNIDAZOLE (FLAGYL) IVPB 500 mg        500 mg 100 mL/hr over 60 Minutes Intravenous Every 12 hours  08/22/22 0345 09/05/22 0449   08/22/22 0100  cefTRIAXone (ROCEPHIN) 2 g in sodium chloride 0.9 % 100 mL IVPB        2 g 200 mL/hr over 30 Minutes Intravenous  Once 08/22/22 0047 08/22/22 0226   08/22/22 0100  metroNIDAZOLE (FLAGYL) IVPB 500 mg        500 mg 100 mL/hr over 60 Minutes Intravenous  Once 08/22/22 0047 08/22/22 0412        Assessment/Plan POD 6 s/p Exploratory laparotomy, sigmoid colectomy, descending colostomy, VAC dressing placement for Diverticulitis with Colonic Obstruction by Dr. Gerrit Friends on 08/31/22 - Path benign  - CT 9/13 w/ central pelvic fluid collection measuring 6.3cm. IR consulted and plans drain placement today. Restart abx -  Patient with ongoing nausea and vomiting. Scant ostomy output (only sweat this am in bag) in several days. Clinically appears to have ileus. CT without significant dilated small bowel. Will see how she does over the next 24 hours. If not opening up tomorrow may need PICC/TPN - Continue wound vac, M/W/F vac changes - WOCN following for new ostomy education - Mobilize, continue therapies. Currently recommended for [facility] PT/OT.  - TOC consult for [facility] PT/OT/RN - Daily labs  FEN - NPO, IVF per TRH VTE - SCDs, Lovenox ID - Restart Zosyn Foley - In place for urinary retention. Consider TOV  - Pr TRH -  HTN GERD Electrolyte abnormalities    LOS: 15 days    Jacinto Halim , [facility] Surgery 09/06/2022, 12:12 PM Please see Amion for pager number during day hours 7:00am-4:30pm

## 2022-09-06 NOTE — TOC Progression Note (Signed)
Transition of Care [facility]) - Progression Note    Patient Details  Name: Kristin Gibson MRN: [id number] Date of Birth: Jun 06, 1956  Transition of Care [facility]) CM/SW Contact  Lavenia Atlas, RN Phone Number: 09/06/2022, 3:56 PM  Clinical Narrative:   Patient is a new ostomy, HHRN, HHPT/OT coordinated with [place] at [facility].Notified Danielle with [employer] to follow for discharge for NPWT, as [facility] is not in network.   TOC will continue follow.    Expected Discharge Plan: Home w Home Health Services Barriers to Discharge: Continued Medical Work up  Expected Discharge Plan and Services Expected Discharge Plan: Home w Home Health Services In-house Referral: NA Discharge Planning Services: CM Consult Post Acute Care Choice: Home Health [facility]) Living arrangements for the past 2 months: Apartment                 DME Arranged: Other see comment, Negative pressure wound device (raised commode seat) DME Agency: [facility], [facility] Date DME Agency Contacted: 09/03/22 Time DME Agency Contacted: [phone] Representative spoke with at DME Agency: [facility] HH Arranged: OT, RN, PT [facility] Agency: [facility] Date [facility] Agency Contacted: 09/03/22 Time HH Agency Contacted: [phone] Representative spoke with at [facility] Agency: Kandee Keen   Social Determinants of Health (SDOH) Interventions    Readmission Risk Interventions    09/03/2022    3:44 PM  Readmission Risk Prevention Plan  PCP or Specialist Appt within 5-7 Days Complete  Home Care Screening Complete  Medication Review (RN CM) Complete

## 2022-09-06 NOTE — Procedures (Signed)
Vascular and Interventional Radiology Procedure Note  Patient: Kristin Gibson DOB: November 07, 1956 Medical Record Number: [id number] Note Date/Time: 09/06/22 2:36 PM   Performing Physician: Roanna Banning, MD Assistant(s): None  Diagnosis: Pelvic abscess  Procedure: DRAINAGE CATHETER PLACEMENT into PELVIC ABSCESS  Anesthesia: Conscious Sedation Complications: None Estimated Blood Loss: Minimal Specimens: Sent for Gram Stain, Aerobe Culture, and Anerobe Culture  Findings:  Successful CT-guided R trans-gluteal approach placement of 12 F catheter into pelvic abscess. 30 mL SS fluid aspirated and submitted for microbiological analysis.  Plan:  - Flush drain with 5 mL Normal Saline every 8 hours. - Follow up drain evaluation / sinogram in 2 week(s).  See detailed procedure note with images in PACS. The patient tolerated the procedure well without incident or complication and was returned to Floor Bed in stable condition.    Roanna Banning, MD Vascular and Interventional Radiology Specialists [facility] Radiology   Pager. [phone] Clinic. [phone]

## 2022-09-07 ENCOUNTER — Inpatient Hospital Stay: Payer: Self-pay

## 2022-09-07 DIAGNOSIS — K5732 Diverticulitis of large intestine without perforation or abscess without bleeding: Secondary | ICD-10-CM | POA: Diagnosis not present

## 2022-09-07 LAB — BASIC METABOLIC PANEL
Anion gap: 7 (ref 5–15)
BUN: 7 mg/dL — ABNORMAL LOW (ref 8–23)
CO2: 26 mmol/L (ref 22–32)
Calcium: 8.4 mg/dL — ABNORMAL LOW (ref 8.9–10.3)
Chloride: 101 mmol/L (ref 98–111)
Creatinine, Ser: 0.61 mg/dL (ref 0.44–1.00)
GFR, Estimated: >60 mL/min (ref >60)
Glucose, Bld: 115 mg/dL — ABNORMAL HIGH (ref 70–99)
Potassium: 3.8 mmol/L (ref 3.5–5.1)
Sodium: 134 mmol/L — ABNORMAL LOW (ref 135–145)

## 2022-09-07 LAB — CBC
HCT: 31.1 % — ABNORMAL LOW (ref 36.0–46.0)
Hemoglobin: 10.3 g/dL — ABNORMAL LOW (ref 12.0–15.0)
MCH: 28.4 pg (ref 26.0–34.0)
MCHC: 33.1 g/dL (ref 30.0–36.0)
MCV: 85.7 fL (ref 80.0–100.0)
Platelets: 338 10*3/uL (ref 150–400)
RBC: 3.63 MIL/uL — ABNORMAL LOW (ref 3.87–5.11)
RDW: 14.4 % (ref 11.5–15.5)
WBC: 9.7 10*3/uL (ref 4.0–10.5)
nRBC: 0 % (ref 0.0–0.2)

## 2022-09-07 LAB — HEMOGLOBIN A1C
Hgb A1c MFr Bld: 6.1 % — ABNORMAL HIGH (ref 4.8–5.6)
Mean Plasma Glucose: 128.37 mg/dL

## 2022-09-07 LAB — GLUCOSE, CAPILLARY
Glucose-Capillary: 100 mg/dL — ABNORMAL HIGH (ref 70–99)
Glucose-Capillary: 118 mg/dL — ABNORMAL HIGH (ref 70–99)

## 2022-09-07 LAB — MAGNESIUM: Magnesium: 2 mg/dL (ref 1.7–2.4)

## 2022-09-07 MED ORDER — SODIUM CHLORIDE 0.9% FLUSH
10.0000 mL | Freq: Two times a day (BID) | INTRAVENOUS | Status: DC
  Administered 2022-09-07: 10 mL
  Administered 2022-09-08: 20 mL
  Administered 2022-09-12 – 2022-09-19 (×8): 10 mL

## 2022-09-07 MED ORDER — SODIUM CHLORIDE 0.9% FLUSH
10.0000 mL | INTRAVENOUS | Status: DC | PRN
  Administered 2022-09-08 – 2022-09-13 (×2): 10 mL

## 2022-09-07 MED ORDER — CHLORHEXIDINE GLUCONATE CLOTH 2 % EX PADS
6.0000 | MEDICATED_PAD | Freq: Every day | CUTANEOUS | Status: DC
  Administered 2022-09-07 – 2022-09-19 (×13): 6 via TOPICAL

## 2022-09-07 MED ORDER — TRAVASOL 10 % IV SOLN
INTRAVENOUS | Status: AC
  Filled 2022-09-07: qty 547.2

## 2022-09-07 MED ORDER — INSULIN ASPART 100 UNIT/ML IJ SOLN
0.0000 [IU] | INTRAMUSCULAR | Status: DC
  Administered 2022-09-08 – 2022-09-12 (×14): 2 [IU] via SUBCUTANEOUS
  Administered 2022-09-12: 3 [IU] via SUBCUTANEOUS
  Administered 2022-09-13 – 2022-09-14 (×3): 2 [IU] via SUBCUTANEOUS

## 2022-09-07 MED ORDER — KCL IN DEXTROSE-NACL 20-5-0.45 MEQ/L-%-% IV SOLN
INTRAVENOUS | Status: AC
  Filled 2022-09-07 (×2): qty 1000

## 2022-09-07 NOTE — Progress Notes (Signed)
Peripherally Inserted Central Catheter Placement  The IV Nurse has discussed with the patient and/or persons authorized to consent for the patient, the purpose of this procedure and the potential benefits and risks involved with this procedure.  The benefits include less needle sticks, lab draws from the catheter, and the patient may be discharged home with the catheter. Risks include, but not limited to, infection, bleeding, blood clot (thrombus formation), and puncture of an artery; nerve damage and irregular heartbeat and possibility to perform a PICC exchange if needed/ordered by physician.  Alternatives to this procedure were also discussed.  Bard Power PICC patient education guide, fact sheet on infection prevention and patient information card has been provided to patient /or left at bedside.    PICC Placement Documentation  PICC Double Lumen 09/07/22 Right Brachial 43 cm 0 cm (Active)  Indication for Insertion or Continuance of Line Administration of hyperosmolar/irritating solutions (i.e. TPN, Vancomycin, etc.) 09/07/22 1316  Exposed Catheter (cm) 0 cm 09/07/22 1316  Site Assessment Clean, Dry, Intact 09/07/22 1316  Lumen #1 Status Flushed;Saline locked;Blood return noted 09/07/22 1316  Lumen #2 Status Flushed;Saline locked;Blood return noted 09/07/22 1316  Dressing Type Transparent;Securing device 09/07/22 1316  Dressing Status Antimicrobial disc in place;Clean, Dry, Intact 09/07/22 1316  Safety Lock Not Applicable 09/07/22 1316  Line Adjustment (NICU/IV Team Only) No 09/07/22 1316  Dressing Intervention New dressing;Other (Comment) 09/07/22 1316  Dressing Change Due 09/14/22 09/07/22 1316       Kristin Gibson 09/07/2022, 1:17 PM

## 2022-09-07 NOTE — Progress Notes (Signed)
PHARMACY - TOTAL PARENTERAL NUTRITION CONSULT NOTE   Indication: Prolonged ileus  Patient Measurements: Height: 5\' 9"  (175.3 cm) Weight: 112 kg (247 lb) IBW/kg (Calculated) : 66.2 TPN AdjBW (KG): 77.7 Body mass index is 36.48 kg/m. 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  9/8: Exploratory laparotomy, sigmoid colectomy, descending colostomy, VAC dressing placement for Diverticulitis with Colonic Obstruction  -9/13: CT 9/13 w/ central pelvic fluid collection measuring 6.3cm -9/14: IR drainage of fluid collection -9/15: Ongoing nausea and some belching, no vomiting. No gas/stool in ostomy pouch. See RD note 9/14. Con't NPO and start TPN for prolonged ileus.  Glucose / Insulin: pre-DM with A1C 6 (01/2022). Will need to start TPN Electrolytes: K 3.1>3.8, Mg 1.5>2 overnight. High risk for refeeding. Renal: Scr <1, UOP ok Hepatic: (Baseline albumin 3.3 on 8/29). LFT's WNL last on 9/1.  Intake / Output; MIVF: NPO.  11/1 at 148ml/hr GI Imaging: -9/13: CT 9/13 w/ central pelvic fluid collection measuring 6.3cm  GI Surgeries / Procedures:  9/8: Exploratory laparotomy, sigmoid colectomy, descending colostomy, VAC dressing placement for Diverticulitis with Colonic Obstruction  -9/14: IR drainage of fluid collection/abscess  Central access: PICC ordered 09/06/22 TPN start date: 09/06/22  Nutritional Goals: Goal TPN rate is 70 mL/hr (provides 95 g of protein and 1774 kcals per day)  RD Assessment: Estimated Needs Total Energy Estimated Needs: 1700-1900 Total Protein Estimated Needs: 95-105g Total Fluid Estimated  Needs: 1.9L/day  Current Nutrition:  8/29: admitted, NPO 9/1: CLD 9/2: NPO 9/3: CLD 9/5: FLD 9/7: NPO 9/8: s/p ex lap, sig colectomy, colostomy, VAC 9/9: CLD -> FLD 9/13: NPO  Plan:  Start TPN at 40 mL/hr at 1800 Electrolytes in TPN: Na 30mEq/L, K 45mEq/L, Ca 19mEq/L, Mg 5mEq/L, and Phos 38mmol/L. Cl:Ac 1:1 (standard) Add standard MVI and trace elements to TPN Initiate Moderate SSI q4h and adjust as needed  Reduce MIVF to 60 mL/hr at 1800 Monitor TPN labs on Mon/Thurs, and PRN  Jackquelyn Sundberg S. 12m, PharmD, BCPS Clinical Staff Pharmacist [email or web address]  Merilynn Finland, Vineet Kinney Stillinger 09/07/2022,11:21 AM

## 2022-09-07 NOTE — Progress Notes (Signed)
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 She was admitted with sigmoid diverticulitis and started on iv antibiotics. General surgery was consulted.  Patient underwent a exploratory laparotomy and colostomy on 08/31/2022. Pt s/p Right TG drain placement 9/14    PT Comments    Pt ambulated in hallway and then repositioned to comfort upon return to bed.  Pt reports being up early this morning and sitting in recliner for 3 hours.  Pt ambulated 240 feet with RW and assist only for managing lines/drains.    Recommendations for follow up therapy are one component of a multi-disciplinary discharge planning process, led by the attending physician.  Recommendations may be updated based on patient status, additional functional criteria and insurance authorization.  Follow Up Recommendations  Home health PT     Assistance Recommended at Discharge Intermittent Supervision/Assistance  Patient can return home with the following A little help with walking and/or transfers;A little help with bathing/dressing/bathroom;Assistance with cooking/housework;Assist for transportation;Help with stairs or ramp for entrance   Equipment Recommendations  None recommended by PT    Recommendations for Other Services       Precautions / Restrictions Precautions Precautions: Fall Precaution Comments: colostomy, VAC, Rt TG drain     Mobility  Bed Mobility Overal bed mobility: Needs Assistance Bed Mobility: Rolling, Sidelying to Sit, Sit to Sidelying Rolling: Min assist Sidelying to sit: Min assist     Sit to sidelying: Mod assist General bed mobility comments: assist for trunk  upright, assist for LEs onto bed    Transfers Overall transfer level: Needs assistance Equipment used: Rolling walker (2 wheels) Transfers: Sit to/from Stand Sit to Stand: Min guard, From elevated surface           General transfer comment: increased time and effort, requested elevated bed    Ambulation/Gait Ambulation/Gait assistance: Min guard Gait Distance (Feet): 240 Feet Assistive device: Rolling walker (2 wheels) Gait Pattern/deviations: Step-through pattern, Trunk flexed, Decreased stride length       General Gait Details: cues for posture, position from RW; pt performed more upright posture during standing rest breaks, denies symptoms   Stairs             Wheelchair Mobility    Modified Rankin (Stroke Patients Only)       Balance                                            Cognition Arousal/Alertness: Awake/alert Behavior During Therapy: WFL for tasks assessed/performed Overall Cognitive Status: Within Functional Limits for tasks assessed                                          Exercises      General Comments        Pertinent Vitals/Pain Pain Assessment Pain Assessment: 0-10 Pain Score: 8  Pain Location: Rt transgluteal drain Pain Descriptors / Indicators: Sore Pain Intervention(s): Repositioned, Monitored during session    Home Living  Prior Function            PT Goals (current goals can now be found in the care plan section) Progress towards PT goals: Progressing toward goals    Frequency    Min 3X/week      PT Plan Current plan remains appropriate    Co-evaluation              AM-PAC PT "6 Clicks" Mobility   Outcome Measure  Help needed turning from your back to your side while in a flat bed without using bedrails?: A Little Help needed moving from lying on your back to sitting on the side of a flat bed without using bedrails?: A  Lot Help needed moving to and from a bed to a chair (including a wheelchair)?: A Lot Help needed standing up from a chair using your arms (e.g., wheelchair or bedside chair)?: A Little Help needed to walk in hospital room?: A Little Help needed climbing 3-5 steps with a railing? : A Lot 6 Click Score: 15    End of Session Equipment Utilized During Treatment: Gait belt Activity Tolerance: Patient tolerated treatment well Patient left: in bed;with call bell/phone within reach;with nursing/sitter in room Nurse Communication: Mobility status PT Visit Diagnosis: Difficulty in walking, not elsewhere classified (R26.2)     Time: 1000-1017 PT Time Calculation (min) (ACUTE ONLY): 17 min  Charges:  $Gait Training: 8-22 mins           Thomasene Mohair PT, DPT Physical Therapist Acute Rehabilitation Services Preferred contact method: Secure Chat Weekend Pager Only: [phone] Office: [phone]    [name] 09/07/2022, 2:21 PM

## 2022-09-07 NOTE — Progress Notes (Signed)
7 Days Post-Op  Subjective: CC: Ongoing nausea and some belching, no vomiting. No gas/stool in ostomy pouch.   Objective: Vital signs in last 24 hours: Temp:  [97.7 F (36.5 C)-98.3 F (36.8 C)] 98.2 F (36.8 C) (09/15 0500) Pulse Rate:  [80-100] 92 (09/15 0500) Resp:  [16-20] 18 (09/15 0500) BP: (124-145)/(81-97) 129/81 (09/15 0500) SpO2:  [94 %-98 %] 98 % (09/15 0500) Last BM Date : 09/01/22  Intake/Output from previous day: 09/14 0701 - 09/15 0700 In: 3134.6 [I.V.:2406.2; IV Piggyback:718.4] Out: 1590 [Urine:1450; Drains:90; Stool:50] Intake/Output this shift: No intake/output data recorded.  PE: Gen:  Alert, NAD, pleasant Abd: Soft, mild distension, some llq and tenderness around her incisions. Hypoactive BS. Wound and stoma evaluated with WOC RN today - midline wound is pink and viable with areas of granulation; LUQ stoma with some necrosis, WOC RN digitized and it is patent, there is mucocutaneous separation from the 12-6 o'clock position with underlying fascia in tact.   R transgluteal drain - cloudy SS  Lab Results:  Recent Labs    09/06/22 0401 09/07/22 0416  WBC 9.7 9.7  HGB 11.1* 10.3*  HCT 33.5* 31.1*  PLT 367 338   BMET Recent Labs    09/06/22 0401 09/07/22 0416  NA 134* 134*  K 3.1* 3.8  CL 100 101  CO2 27 26  GLUCOSE 132* 115*  BUN 6* 7*  CREATININE 0.63 0.61  CALCIUM 8.7* 8.4*   PT/INR Recent Labs    09/06/22 0401  LABPROT 16.2*  INR 1.3*   CMP     Component Value Date/Time   NA 134 (L) 09/07/2022 0416   K 3.8 09/07/2022 0416   CL 101 09/07/2022 0416   CO2 26 09/07/2022 0416   GLUCOSE 115 (H) 09/07/2022 0416   BUN 7 (L) 09/07/2022 0416   CREATININE 0.61 09/07/2022 0416   CALCIUM 8.4 (L) 09/07/2022 0416   PROT 7.0 08/24/2022 0419   ALBUMIN 2.8 (L) 08/24/2022 0419   AST 14 (L) 08/24/2022 0419   ALT 13 08/24/2022 0419   ALKPHOS 48 08/24/2022 0419   BILITOT 0.5 08/24/2022 0419   GFRNONAA >60 09/07/2022 0416   GFRAA >60  06/28/2020 0548   Lipase     Component Value Date/Time   LIPASE 26 08/21/2022 2306    Studies/Results: CT GUIDED VISCERAL FLUID DRAIN BY PERC CATH  Result Date: 09/06/2022 INDICATION: History of diverticulitis s/p colectomy complicated by postoperative pelvic abscess. EXAM: CT-GUIDED PELVIC DRAINAGE CATHETER PLACEMENT COMPARISON:  None Available. MEDICATIONS: The patient is currently admitted to the hospital and receiving intravenous antibiotics. The antibiotics were administered within an appropriate time frame prior to the initiation of the procedure. ANESTHESIA/SEDATION: Moderate (conscious) sedation was employed during this procedure. A total of Versed 2 mg and Fentanyl 100 mcg was administered intravenously. Moderate Sedation Time: 23 minutes. The patient's level of consciousness and vital signs were monitored continuously by radiology nursing throughout the procedure under my direct supervision. CONTRAST:  None COMPLICATIONS: None immediate. PROCEDURE: RADIATION DOSE REDUCTION: This exam was performed according to the departmental dose-optimization program which includes automated exposure control, adjustment of the mA and/or kV according to patient size and/or use of iterative reconstruction technique. Informed written consent was obtained from the patient after a discussion of the risks, benefits and alternatives to treatment. The patient was placed prone on the CT gantry and a pre procedural CT was performed re-demonstrating the known abscess/fluid collection within the deep pelvis. The procedure was planned. A timeout was  performed prior to the initiation of the procedure. The RIGHT gluteus was prepped and draped in the usual sterile fashion. The overlying soft tissues were anesthetized with 1% lidocaine with epinephrine. Appropriate trajectory was planned with the use of a 22 gauge spinal needle. An 18 gauge trocar needle was advanced into the abscess/fluid collection and a short Amplatz super  stiff wire was coiled within the collection. Appropriate positioning was confirmed with a limited CT scan. The tract was serially dilated allowing placement of a 12 Fr drainage catheter. Appropriate positioning was confirmed with a limited postprocedural CT scan. 30 mL ml of serosanguineous fluid was aspirated. The tube was connected to a bulb suction and sutured in place. A dressing was placed. The patient tolerated the procedure well without immediate post procedural complication. IMPRESSION: Successful CT guided placement of a 12 Fr drainage catheter for a pelvic abscess, via RIGHT transgluteal approach, as above. Aspiration of 25 mL of serosanguineous fluid was performed. Samples were sent to the laboratory as requested by the ordering clinical team. Roanna Banning, MD [facility] Radiology Electronically Signed   By: Roanna Banning M.D.   On: 09/06/2022 16:31   CT ABDOMEN PELVIS W CONTRAST  Result Date: 09/05/2022 CLINICAL DATA:  Postop partial colectomy and colostomy formation 08/31/2022. Evaluate for bowel obstruction versus postoperative ileus. EXAM: CT ABDOMEN AND PELVIS WITH CONTRAST TECHNIQUE: Multidetector CT imaging of the abdomen and pelvis was performed using the standard protocol following bolus administration of intravenous contrast. RADIATION DOSE REDUCTION: This exam was performed according to the departmental dose-optimization program which includes automated exposure control, adjustment of the mA and/or kV according to patient size and/or use of iterative reconstruction technique. CONTRAST:  OMNIPAQUE IOHEXOL 300 MG/ML  SOLN COMPARISON:  Abdominopelvic CT 08/26/2022 and 08/22/2022. FINDINGS: Note: Contrast bolus is suboptimal. Lower chest: Trace pleural effusions with mild bibasilar atelectasis. No confluent airspace opacity or basilar pneumothorax. The heart is mildly enlarged. There is a moderate pericardial effusion which appears similar  to the previous study. Hepatobiliary: The liver is normal in density without suspicious focal abnormality. No evidence of gallstones, gallbladder wall thickening or biliary dilatation. Pancreas: Unremarkable. No pancreatic ductal dilatation or surrounding inflammatory changes. Spleen: Normal in size without focal abnormality. Adrenals/Urinary Tract: Both adrenal glands appear normal. No evidence of urinary tract calculus, suspicious renal lesion or hydronephrosis. Bilateral renal cysts are grossly stable and do not require any specific imaging follow-up. The urinary bladder is decompressed by a Foley catheter. Stomach/Bowel: No enteric contrast administered. There are postsurgical changes at the GE junction and along the greater curvature of the stomach, the latter suggesting previous gastric sleeve resection. The stomach is decompressed. There is mild bowel dilatation at the duodenal-jejunal junction. No other significant small or large bowel distention identified. There is a descending colostomy in the left lower quadrant anterior abdominal wall and a long segment Hartman pouch. Vascular/Lymphatic: Multiple prominent retroperitoneal and base of mesentery lymph nodes are similar to the preoperative study. There is aortic and branch vessel atherosclerosis without evidence of aneurysm or large vessel occlusion. Multiple retroperitoneal phleboliths are present bilaterally. Reproductive: Hysterectomy.  No adnexal mass. Other: As above, postsurgical changes in the anterior abdominal wall with a descending colostomy. Midline abdominal incision appears incompletely closed. Rounded fluid collection in the pelvis anterior to the rectum measures up to 6.3 x 4.6 cm on image 88/2 and demonstrates thin peripheral enhancement. There are other smaller fluid collections in both pericolic gutters measuring up to 3.5 cm  on the right (coronal image 34/6) and 2.3 cm on the left (axial image 56/2). No free air. Musculoskeletal: No  acute or significant osseous findings. Thoracolumbar spondylosis. IMPRESSION: 1. Postsurgical changes from interval sigmoidectomy and descending colostomy. Central pelvic fluid collection measuring up to 6.3 cm in diameter demonstrates thin peripheral enhancement and could reflect an early abscess. There are additional smaller peritoneal fluid collections. No evidence of free air or anastomotic breakdown. 2. No evidence of bowel obstruction or significant ileus. 3. Small bilateral pleural effusions with bibasilar atelectasis. Stable moderate-sized pericardial effusion. 4. Pre-existing postsurgical changes at the GE junction and stomach consistent with gastric sleeve resection. Unchanged prominent retroperitoneal lymph nodes, likely reactive. Electronically Signed   By: Carey Bullocks M.D.   On: 09/05/2022 15:46    Anti-infectives: Anti-infectives (From admission, onward)    Start     Dose/Rate Route Frequency Ordered Stop   09/06/22 1400  piperacillin-tazobactam (ZOSYN) IVPB 3.375 g        3.375 g 12.5 mL/hr over 240 Minutes Intravenous Every 8 hours 09/06/22 1302     09/02/22 1400  ceFEPIme (MAXIPIME) 2 g in sodium chloride 0.9 % 100 mL IVPB        2 g 200 mL/hr over 30 Minutes Intravenous Every 8 hours 09/02/22 1015 09/05/22 0103   08/31/22 0600  cefoTEtan (CEFOTAN) 2 g in sodium chloride 0.9 % 100 mL IVPB  Status:  Discontinued        2 g 200 mL/hr over 30 Minutes Intravenous On call to O.R. 08/30/22 1316 08/31/22 1505   08/26/22 1400  ceFEPIme (MAXIPIME) 2 g in sodium chloride 0.9 % 100 mL IVPB  Status:  Discontinued        2 g 200 mL/hr over 30 Minutes Intravenous Every 8 hours 08/26/22 0848 09/02/22 1015   08/23/22 0400  cefTRIAXone (ROCEPHIN) 2 g in sodium chloride 0.9 % 100 mL IVPB  Status:  Discontinued        2 g 200 mL/hr over 30 Minutes Intravenous Every 24 hours 08/22/22 0345 08/26/22 0848   08/22/22 1400  metroNIDAZOLE (FLAGYL) IVPB 500 mg        500 mg 100 mL/hr over 60 Minutes  Intravenous Every 12 hours 08/22/22 0345 09/05/22 0449   08/22/22 0100  cefTRIAXone (ROCEPHIN) 2 g in sodium chloride 0.9 % 100 mL IVPB        2 g 200 mL/hr over 30 Minutes Intravenous  Once 08/22/22 0047 08/22/22 0226   08/22/22 0100  metroNIDAZOLE (FLAGYL) IVPB 500 mg        500 mg 100 mL/hr over 60 Minutes Intravenous  Once 08/22/22 0047 08/22/22 0412        Assessment/Plan POD 7 s/p Exploratory laparotomy, sigmoid colectomy, descending colostomy, VAC dressing placement for Diverticulitis with Colonic Obstruction by Dr. Gerrit Friends on 08/31/22 - Path benign  - CT 9/13 w/ central pelvic fluid collection measuring 6.3cm. s/p IR drainage 9/14 - follow Cx, NGTD - Patient with ongoing nausea and no bowel function. CT without significant dilated small bowel. Monitor stoma closely.  - start PICC/TPN - Continue wound vac, M/W/F vac changes - WOCN following for new ostomy education - Mobilize, continue therapies. Currently recommended for [facility] PT/OT.  - TOC consult for [facility] PT/OT/RN - Daily labs  FEN - NPO, IVF per TR[facility] VTE - SCDs, Lovenox ID - Restart Zosyn Foley - In place for urinary retention. Consider TOV  - Pr TRH -  HTN GERD Electrolyte abnormalities    LOS:  16 days    Adam Phenix , [facility] Surgery 09/07/2022, 10:51 AM Please see Amion for pager number during day hours 7:00am-4:30pm

## 2022-09-07 NOTE — Progress Notes (Signed)
Notified on call provider about patient having 7/10 abdominal left upper quadrant pain even after patient was given PRN IV fentanyl at 0443. Gave patient hot packs to apply to area. Will continue to monitor, will pass along to dayshift, and will give PRN IV pain meds the next time it is due.

## 2022-09-07 NOTE — Consult Note (Signed)
[place] Nurse wound follow up: POD 7 Assessed with CCS-PA-C L. Simaan today.  Wound type:Surgical Measurement:per Monday, 15cm x 5cm x 3cm Wound IOE:VOJJ, moist Drainage (amount, consistency, odor) small serosanguinous in distal 1/4 today following Granufoam removal, stops immediately with a few seconds of gentle pressure Periwound:intact. Skin barrier ring pieces used at distal margin from 5-7 o'clock and at 3 o'clock at umbilicus. Dressing procedure/placement/frequency: One piece of black foam and dressing drape removed using medical adhesive remover spray. Patient tolerated procedure well. Wound cleansed with NS and patted dry. Periwound skin protection with skin barrier rings as noted above.One piece of black foam used to obliterate dead space and secured with drape. An immediate seal is achieved. Dressing labeled with number and type of wound contact layers, also today's date.  Next dressing change is due on Monday, 09/10/22.  Next dressing kit is in the room along with skin barrier rings and medical adhesive remover spray.  [place] Nurse ostomy follow up Stoma type/location: LLQ colostomy Stomal assessment/size: 1 and 3/8 x 2 inches oval with stoma recessed below skin level on lateral aspect. Fascia intact, lumen in center. Stoma is not functioning, serosanguinous effluent in a small amount in pouch. Peristomal assessment: intact Treatment options for stomal/peristomal skin: skin barrier rings, 1 whole ring used to encircle stoma, 1/2 ring placed from 2-5 o'clock as an additional, barrier to protect lateral peristomal skin. Output Small amount serosanguinous effluent. Ostomy pouching: 2pc. 2 and 3/4 inch ostomy pouching system with 1 and 1/2 skin barrier rings.  Two of the 2-piece set-ups are in the patient's room.  An additional box of 1-piece compressible convex pouches is also in room for later consideration. Two-piece system is recommended while surgery team is closely monitoring ostomy  status. Education provided: Patient can perform Lock and Roll closure and knows that cleaning of tail closure with toilet paper wicks is advised. She is not able to perform this at this time and there is no stool in the pouch. Daughter (who is a recently graduated Therapist, sports) has participated in 1 teaching session,. The other daughter will not be able to assist with ostomy care.  Recommend HHRN or short term SNF/Rehab.  If you agree, please order or arrange [facility] consult. Enrolled patient in [place] Start Discharge program: No. Pouching system is not yet determined.  [place] nursing team will follow, will remain available to this patient, the nursing and medical teams.   Thank you for inviting [place] to participate in this patient's Plan of Care.  Maudie Flakes, MSN, RN, CNS, [place], Serita Grammes, [employer], [employer] phone:  [phone]

## 2022-09-07 NOTE — Plan of Care (Signed)
  Problem: Education: Goal: Knowledge of General Education information will improve Description: Including pain rating scale, medication(s)/side effects and non-pharmacologic comfort measures Outcome: Progressing   Problem: Coping: Goal: Level of anxiety will decrease Outcome: Progressing   Problem: Elimination: Goal: Will not experience complications related to urinary retention Outcome: Progressing   Problem: Pain Managment: Goal: General experience of comfort will improve Outcome: Progressing   Problem: Safety: Goal: Ability to remain free from injury will improve Outcome: Progressing   Problem: Skin Integrity: Goal: Risk for impaired skin integrity will decrease Outcome: Progressing   Problem: Respiratory: Goal: Respiratory status will improve Outcome: Progressing

## 2022-09-07 NOTE — Progress Notes (Addendum)
Referring Physician(s): White,C  Supervising Physician: Mir, Secondary school teacher  Patient Status:  [facility] - In-pt  Chief Complaint:  Abdominal pain, postop pelvic abscess  Subjective: Pt asleep upon entry to room; once awake she stated that she has some discomfort at rt TG drain site as well as LUQ region near ostomy   Allergies: Benazepril, Sulfa antibiotics, and Elemental sulfur  Medications: Prior to Admission medications   Medication Sig Start Date End Date Taking? Authorizing Provider  acetaminophen (TYLENOL) 500 MG tablet Take 1,000 mg by mouth daily as needed for mild pain. pain    Yes [provider]  amLODipine (NORVASC) 10 MG tablet Take 10 mg by mouth daily with supper. 08/08/17  Yes [provider]  amoxicillin (AMOXIL) 500 MG capsule Take 2,000 mg by mouth once.   Yes [provider]  amoxicillin-clavulanate (AUGMENTIN) 875-125 MG tablet Take 1 tablet by mouth 2 (two) times daily. 08/17/22  Yes [provider]  cloNIDine (CATAPRES) 0.1 MG tablet Take 0.1 mg by mouth daily with supper. 10/13/18  Yes [provider]  dicyclomine (BENTYL) 20 MG tablet Take 20 mg by mouth every 8 (eight) hours. 12/05/16  Yes [provider]  methocarbamol (ROBAXIN) 500 MG tablet Take 500 mg by mouth every 8 (eight) hours as needed for muscle spasms.   Yes [provider]  Multiple Vitamin (MULTIVITAMIN) tablet Take 1 tablet by mouth daily.   Yes [provider]  ondansetron (ZOFRAN-ODT) 4 MG disintegrating tablet Take 1 tablet (4 mg total) by mouth every 6 (six) hours as needed for nausea or vomiting. 02/20/22  Yes Berna Bue, MD  pantoprazole (PROTONIX) 40 MG tablet Take 1 tablet (40 mg total) by mouth daily. 02/20/22  Yes Berna Bue, MD  polyethylene glycol (MIRALAX / GLYCOLAX) 17 g packet Take 17 g by mouth daily as needed (constipation).   Yes [provider]  Probiotic CAPS Take 1 capsule by mouth daily.  06/28/20  Yes Ward, Layla Maw, DO  tizanidine (ZANAFLEX) 2 MG capsule Take 2 mg by mouth every 8 (eight) hours as needed for muscle spasms.   Yes [provider]  traMADol (ULTRAM) 50 MG tablet Take 1 tablet (50 mg total) by mouth every 6 (six) hours as needed (pain). 02/20/22  Yes Berna Bue, MD  gabapentin (NEURONTIN) 100 MG capsule Take 2 capsules (200 mg total) by mouth every 12 (twelve) hours. Patient not taking: Reported on 08/22/2022 02/20/22   Berna Bue, MD     Vital Signs: BP 129/81 (BP Location: Left Arm)   Pulse 92   Temp 98.2 F (36.8 C) (Oral)   Resp 18   Ht 5\' 9"  (1.753 m)   Wt 247 lb (112 kg)   SpO2 98%   BMI 36.48 kg/m   Physical Exam drowsy but arousable, rt TG drain intact, dressing clean and dry, site mildly tender, OP 90 cc serosang fluid; drain flushed but minimal return; LUQ ostomy with blood-tinged output  Imaging: CT GUIDED VISCERAL FLUID DRAIN BY PERC CATH  Result Date: 09/06/2022 INDICATION: History of diverticulitis s/p colectomy complicated by postoperative pelvic abscess. EXAM: CT-GUIDED PELVIC DRAINAGE CATHETER PLACEMENT COMPARISON:  None Available. MEDICATIONS: The patient is currently admitted to the hospital and receiving intravenous antibiotics. The antibiotics were administered within an appropriate time frame prior to the initiation of the procedure. ANESTHESIA/SEDATION: Moderate (conscious) sedation was employed during this procedure. A total of Versed 2 mg and Fentanyl 100 mcg was administered intravenously. Moderate  Sedation Time: 23 minutes. The patient's level of consciousness and vital signs were monitored continuously by radiology nursing throughout the procedure under my direct supervision. CONTRAST:  None COMPLICATIONS: None immediate. PROCEDURE: RADIATION DOSE REDUCTION: This exam was performed according to the departmental dose-optimization program which includes automated exposure control, adjustment of the mA and/or kV  according to patient size and/or use of iterative reconstruction technique. Informed written consent was obtained from the patient after a discussion of the risks, benefits and alternatives to treatment. The patient was placed prone on the CT gantry and a pre procedural CT was performed re-demonstrating the known abscess/fluid collection within the deep pelvis. The procedure was planned. A timeout was performed prior to the initiation of the procedure. The RIGHT gluteus was prepped and draped in the usual sterile fashion. The overlying soft tissues were anesthetized with 1% lidocaine with epinephrine. Appropriate trajectory was planned with the use of a 22 gauge spinal needle. An 18 gauge trocar needle was advanced into the abscess/fluid collection and a short Amplatz super stiff wire was coiled within the collection. Appropriate positioning was confirmed with a limited CT scan. The tract was serially dilated allowing placement of a 12 Fr drainage catheter. Appropriate positioning was confirmed with a limited postprocedural CT scan. 30 mL ml of serosanguineous fluid was aspirated. The tube was connected to a bulb suction and sutured in place. A dressing was placed. The patient tolerated the procedure well without immediate post procedural complication. IMPRESSION: Successful CT guided placement of a 12 Fr drainage catheter for a pelvic abscess, via RIGHT transgluteal approach, as above. Aspiration of 25 mL of serosanguineous fluid was performed. Samples were sent to the laboratory as requested by the ordering clinical team. Roanna Banning, MD [facility] Electronically Signed   By: Roanna Banning M.D.   On: 09/06/2022 16:31   CT ABDOMEN PELVIS W CONTRAST  Result Date: 09/05/2022 CLINICAL DATA:  Postop partial colectomy and colostomy formation 08/31/2022. Evaluate for bowel obstruction versus postoperative ileus. EXAM: CT ABDOMEN AND PELVIS WITH CONTRAST  TECHNIQUE: Multidetector CT imaging of the abdomen and pelvis was performed using the standard protocol following bolus administration of intravenous contrast. RADIATION DOSE REDUCTION: This exam was performed according to the departmental dose-optimization program which includes automated exposure control, adjustment of the mA and/or kV according to patient size and/or use of iterative reconstruction technique. CONTRAST:  OMNIPAQUE IOHEXOL 300 MG/ML  SOLN COMPARISON:  Abdominopelvic CT 08/26/2022 and 08/22/2022. FINDINGS: Note: Contrast bolus is suboptimal. Lower chest: Trace pleural effusions with mild bibasilar atelectasis. No confluent airspace opacity or basilar pneumothorax. The heart is mildly enlarged. There is a moderate pericardial effusion which appears similar to the previous study. Hepatobiliary: The liver is normal in density without suspicious focal abnormality. No evidence of gallstones, gallbladder wall thickening or biliary dilatation. Pancreas: Unremarkable. No pancreatic ductal dilatation or surrounding inflammatory changes. Spleen: Normal in size without focal abnormality. Adrenals/Urinary Tract: Both adrenal glands appear normal. No evidence of urinary tract calculus, suspicious renal lesion or hydronephrosis. Bilateral renal cysts are grossly stable and do not require any specific imaging follow-up. The urinary bladder is decompressed by a Foley catheter. Stomach/Bowel: No enteric contrast administered. There are postsurgical changes at the GE junction and along the greater curvature of the stomach, the latter suggesting previous gastric sleeve resection. The stomach is decompressed. There is mild bowel dilatation at the duodenal-jejunal junction. No other significant small or large bowel distention identified. 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 Vascular/Lymphatic: Multiple prominent retroperitoneal and  base of mesentery lymph nodes are similar to the preoperative study. There is aortic and branch vessel atherosclerosis without evidence of aneurysm or large vessel occlusion. Multiple retroperitoneal phleboliths are present bilaterally. Reproductive: Hysterectomy.  No adnexal mass. Other: As above, postsurgical changes in the anterior abdominal wall with a descending colostomy. Midline abdominal incision appears incompletely closed. Rounded fluid collection in the pelvis anterior to the rectum measures up to 6.3 x 4.6 cm on image 88/2 and demonstrates thin peripheral enhancement. There are other smaller fluid collections in both pericolic gutters measuring up to 3.5 cm on the right (coronal image 34/6) and 2.3 cm on the left (axial image 56/2). No free air. Musculoskeletal: No acute or significant osseous findings. Thoracolumbar spondylosis. IMPRESSION: 1. Postsurgical changes from interval sigmoidectomy and descending colostomy. Central pelvic fluid collection measuring up to 6.3 cm in diameter demonstrates thin peripheral enhancement and could reflect an early abscess. There are additional smaller peritoneal fluid collections. No evidence of free air or anastomotic breakdown. 2. No evidence of bowel obstruction or significant ileus. 3. Small bilateral pleural effusions with bibasilar atelectasis. Stable moderate-sized pericardial effusion. 4. Pre-existing postsurgical changes at the GE junction and stomach consistent with gastric sleeve resection. Unchanged prominent retroperitoneal lymph nodes, likely reactive. Electronically Signed   By: Carey Bullocks M.D.   On: 09/05/2022 15:46    Labs:  CBC: Recent Labs    09/04/22 0343 09/05/22 0351 09/06/22 0401 09/07/22 0416  WBC 13.3* 10.2 9.7 9.7  HGB 9.6* 10.6* 11.1* 10.3*  HCT 30.2* 33.5* 33.5* 31.1*  PLT 298 311 367 338    COAGS: Recent Labs    09/06/22 0401  INR 1.3*    BMP: Recent Labs    09/04/22 0343 09/05/22 0351 09/06/22 0401  09/07/22 0416  NA 137 136 134* 134*  K 3.6 2.9* 3.1* 3.8  CL 107 104 100 101  CO2 25 24 27 26   GLUCOSE 100* 122* 132* 115*  BUN 13 7* 6* 7*  CALCIUM 8.2* 8.3* 8.7* 8.4*  CREATININE 0.79 0.74 0.63 0.61  GFRNONAA >60 >60 >60 >60    LIVER FUNCTION TESTS: Recent Labs    02/20/22 0404 08/21/22 2306 08/23/22 0418 08/24/22 0419  BILITOT 0.3 0.5 0.5 0.5  AST 26 18 16  14*  ALT 24 16 14 13   ALKPHOS 47 55 50 48  PROT 7.7 7.9 7.0 7.0  ALBUMIN 3.8 3.3* 2.8* 2.8*    Assessment and Plan: Pt with hx sigmoid diverticulitis with prior exploratory laparotomy, sigmoid colectomy, descending colostomy with Dr. 10/24/22 on 08/31/2022; now with postop pelvic abscess, s/p rt TG drain placement 9/14; afebrile, WBC nl, hgb 10.3, creat nl, cx neg to date  Drain Location: right TG  Size: Fr size: 12 Fr Date of placement: 09/06/22  Currently to: Drain collection device: suction bulb 24 hour output:  Output by Drain (mL) 09/05/22 0701 - 09/05/22 1900 09/05/22 1901 - 09/06/22 0700 09/06/22 0701 - 09/06/22 1900 09/06/22 1901 - 09/07/22 0700 09/07/22 0701 - 09/07/22 1045  Closed System Drain 1 Right Buttock Bulb (JP) 12 Fr.   60 30   Negative Pressure Wound Therapy Abdomen Lower  2         Current examination: Flushes but with minimal return  Insertion site unremarkable. Suture and stat lock in place. Dressed appropriately.   Plan: Continue TID flushes with 5 cc NS. Record output Q shift.  Dressing changes QD or PRN if soiled.  Call IR APP or on call IR MD if difficulty flushing or sudden change in drain output.  Repeat imaging/possible drain injection once output < 10 mL/QD (excluding flush material). Consideration for drain removal if output is < 10 mL/QD (excluding flush material), pending discussion with the providing surgical service.  Discharge planning: Please contact IR APP or on call IR MD prior to patient d/c to ensure appropriate follow up plans are in place. Typically patient will follow  up with IR clinic 10-14 days post d/c for repeat imaging/possible drain injection. IR scheduler will contact patient with date/time of appointment. Patient will need to flush drain QD with 5 cc NS, record output QD, dressing changes every 2-3 days or earlier if soiled.   IR will continue to follow - please call with questions or concerns.  If LUQ pain does not improve consider f/u imaging    Electronically Signed: D. Jeananne Rama, PA-C 09/07/2022, 10:40 AM   I spent a total of 15 Minutes at the the patient's bedside AND on the patient's hospital floor or unit, greater than 50% of which was counseling/coordinating care for pelvic abscess drain    Patient ID: Kristin Gibson, female   DOB: 27-Apr-1956, 66 y.o.   MRN: [id number]

## 2022-09-07 NOTE — Progress Notes (Signed)
PROGRESS NOTE    Kristin Gibson  DHR:[id number] DOB: 01-Jan-1956 DOA: 08/21/2022 PCP: Woodroe Chen, MD    Brief Narrative:   Kristin Gibson is a 65 y.o. female with past medical history significant for morbid obesity s/p gastric sleeve surgery February 2023, recurrent diverticulitis in which she recently completed 2 rounds of antibiotics who presented to [facility] ED on 8/29 with progressive/recurrent abdominal pain, fever/chills.  Work-up in the ED notable for recurrent sigmoid diverticulitis.  Patient was started on IV antibiotics.  General surgery was consulted.  Initially admitted to the hospital service.  Patient underwent exploratory laparotomy, sigmoid colectomy, descending colostomy with wound VAC placement by Dr. Gerrit Friends on 08/31/2022.  Care was transferred to the general surgery service on 09/03/2022.  Assessment & Plan:   Recurrent sigmoid diverticulitis Postoperative pelvic abscess Patient presenting to ED with progressive abdominal pain associate with fever/chills.  Work-up revealing recurrent sigmoid diverticulitis.  Patient recently completed 2 courses of outpatient antibiotics which she has failed.  General surgery was consulted and patient underwent exploratory laparotomy with sigmoid colectomy, descending colostomy with wound VAC placement by Dr. Gerrit Friends on 08/31/2022.  Repeat CT abdomen/pelvis with contrast 9/13 notable for central pelvic fluid collection measuring 6.3 cm with thin peripheral enhancement consistent with early abscess and additional smaller peritoneal fluid collections, no free air or antibiotics static breakdown, no bowel obstruction or ileus. --Zosyn --s/p IR pelvic drain placement 9/14 --D5 half NS with 20 mEq KCL at 179mL/h --Follow-up cultures from drain --Wound care, pain control, diet advancement per general surgery; started on TPN today  Essential hypertension -- Amlodipine 10 mg p.o. daily -- Clonidine 0.1 mg p.o. qPM -- Hydralazine 5 mg IV q4h  PRN SBP >170 or DBP >95  GERD --Protonix 40 mg IV daily  Hypokalemia Hypomagnesemia Potassium 3.8 this morning. --Repeat electrolytes in the a.m.   Morbid obesity Body mass index is 36.48 kg/m.  Discussed with patient needs for aggressive lifestyle changes/weight loss as this complicates all facets of care.  Outpatient follow-up with PCP/bariatric surgery.   DVT prophylaxis: enoxaparin (LOVENOX) injection 40 mg Start: 09/06/22 2200 SCD's Start: 08/30/22 1310    Code Status: Full Code Family Communication: No family present at bedside this morning  Disposition Plan:  Level of care: Med-Surg Status is: Inpatient Remains inpatient appropriate because: Per primary, general surgery     Procedures:  Exploratory laparotomy with sigmoid colectomy, descending colostomy with wound VAC placement, Dr. Gerrit Friends on 08/31/2022.   Antimicrobials:  Ceftriaxone 8/29 - 8/2 Metronidazole 8/29 - 9/12 Cefepime 9/3 - 9/12 Zosyn 9/14>>   Subjective: Patient seen examined bedside, resting comfortably.  Lying in bed.  RN present.  No family present this morning.  Continues with pain to wound VAC/IR drain site.  General surgery starting TPN today.  Blood pressure well controlled.  Patient with no other complaints or concerns at this time.  Denies headache, no dizziness, no chest pain, no palpitations, no shortness of breath, no fever/chills/night sweats, no nausea/vomiting, no focal weakness, no fatigue, no cough/congestion, no paresthesias.  No acute events overnight per nursing staff.  Objective: Vitals:   09/06/22 1310 09/06/22 1538 09/06/22 2121 09/07/22 0500  BP: (!) 141/97 (!) 143/89 129/83 129/81  Pulse: 95 89 80 92  Resp: 16 16 19 18   Temp:  98.3 F (36.8 C) 98.3 F (36.8 C) 98.2 F (36.8 C)  TempSrc:  Oral Oral Oral  SpO2: 95% 95% 94% 98%  Weight:      Height:  Intake/Output Summary (Last 24 hours) at 09/07/2022 1133 Last data filed at 09/07/2022 0617 Gross per 24 hour   Intake 2706.52 ml  Output 940 ml  Net 1766.52 ml   Filed Weights   08/21/22 2220 08/31/22 0815  Weight: 112.5 kg 112 kg    Examination:  Physical Exam: GEN: NAD, alert and oriented x 3, obese HEENT: NCAT, PERRL, EOMI, sclera clear, MMM PULM: CTAB w/o wheezes/crackles, normal respiratory effort, on room air CV: RRR w/o M/G/R GI: abd soft, mild TTP surrounding surgical site, slightly distended, wound VAC and ostomy noted, JP drain noted with no fluid in bulb GU: Foley catheter noted in place, draining clear yellow urine in collection bag MSK: no peripheral edema, moves all extremities independently NEURO: CN II-XII intact, no focal deficits, sensation to light touch intact PSYCH: normal mood/affect Integumentary: Surgical incision site noted with wound VAC in place, ostomy noted in place, JP drain noted, otherwise no other concerning rashes/lesions/wounds on exposed areas     Data Reviewed: I have personally reviewed following labs and imaging studies  CBC: Recent Labs  Lab 09/03/22 1012 09/04/22 0343 09/05/22 0351 09/06/22 0401 09/07/22 0416  WBC 15.5* 13.3* 10.2 9.7 9.7  NEUTROABS 13.5* 11.0* 8.0* 7.3  --   HGB 10.2* 9.6* 10.6* 11.1* 10.3*  HCT 32.7* 30.2* 33.5* 33.5* 31.1*  MCV 90.1 89.9 89.8 84.8 85.7  PLT 336 298 311 367 338   Basic Metabolic Panel: Recent Labs  Lab 09/03/22 1012 09/04/22 0343 09/05/22 0351 09/06/22 0401 09/07/22 0416  NA 138 137 136 134* 134*  K 3.2* 3.6 2.9* 3.1* 3.8  CL 109 107 104 100 101  CO2 23 25 24 27 26   GLUCOSE 110* 100* 122* 132* 115*  BUN 16 13 7* 6* 7*  CREATININE 0.86 0.79 0.74 0.63 0.61  CALCIUM 8.8* 8.2* 8.3* 8.7* 8.4*  MG 2.1  --   --  1.5* 2.0  PHOS  --   --   --  2.7  --    GFR: Estimated Creatinine Clearance: 92.3 mL/min (by C-G formula based on SCr of 0.61 mg/dL). Liver Function Tests: No results for input(s): "AST", "ALT", "ALKPHOS", "BILITOT", "PROT", "ALBUMIN" in the last 168 hours. No results for input(s):  "LIPASE", "AMYLASE" in the last 168 hours. No results for input(s): "AMMONIA" in the last 168 hours. Coagulation Profile: Recent Labs  Lab 09/06/22 0401  INR 1.3*   Cardiac Enzymes: No results for input(s): "CKTOTAL", "CKMB", "CKMBINDEX", "TROPONINI" in the last 168 hours. BNP (last 3 results) No results for input(s): "PROBNP" in the last 8760 hours. HbA1C: No results for input(s): "HGBA1C" in the last 72 hours. CBG: No results for input(s): "GLUCAP" in the last 168 hours. Lipid Profile: No results for input(s): "CHOL", "HDL", "LDLCALC", "TRIG", "CHOLHDL", "LDLDIRECT" in the last 72 hours. Thyroid Function Tests: No results for input(s): "TSH", "T4TOTAL", "FREET4", "T3FREE", "THYROIDAB" in the last 72 hours. Anemia Panel: No results for input(s): "VITAMINB12", "FOLATE", "FERRITIN", "TIBC", "IRON", "RETICCTPCT" in the last 72 hours. Sepsis Labs: No results for input(s): "PROCALCITON", "LATICACIDVEN" in the last 168 hours.  Recent Results (from the past 240 hour(s))  Aerobic/Anaerobic Culture w Gram Stain (surgical/deep wound)     Status: None (Preliminary result)   Collection Time: 09/06/22 12:53 PM   Specimen: Abdomen; Abscess  Result Value Ref Range Status   Specimen Description   Final    ABDOMEN Performed at [facility], [address] [place], [place] [id number]    Special Requests  Final    NONE Performed at [facility], [address] [place], [place] [address]    Gram Stain   Final    MODERATE WBC PRESENT,BOTH PMN AND MONONUCLEAR NO ORGANISMS SEEN    Culture   Final    NO GROWTH < 24 HOURS Performed at [facility], [address], [place], [place] [address]    Report Status PENDING  Incomplete         Radiology Studies: [place] EKG SITE RITE  Result Date: 09/07/2022 If Site Rite image not attached, placement could not be confirmed due to current cardiac rhythm.  CT GUIDED VISCERAL FLUID DRAIN BY PERC  CATH  Result Date: 09/06/2022 INDICATION: History of diverticulitis s/p colectomy complicated by postoperative pelvic abscess. EXAM: CT-GUIDED PELVIC DRAINAGE CATHETER PLACEMENT COMPARISON:  None Available. MEDICATIONS: The patient is currently admitted to the hospital and receiving intravenous antibiotics. The antibiotics were administered within an appropriate time frame prior to the initiation of the procedure. ANESTHESIA/SEDATION: Moderate (conscious) sedation was employed during this procedure. A total of Versed 2 mg and Fentanyl 100 mcg was administered intravenously. Moderate Sedation Time: 23 minutes. The patient's level of consciousness and vital signs were monitored continuously by radiology nursing throughout the procedure under my direct supervision. CONTRAST:  None COMPLICATIONS: None immediate. PROCEDURE: RADIATION DOSE REDUCTION: This exam was performed according to the departmental dose-optimization program which includes automated exposure control, adjustment of the mA and/or kV according to patient size and/or use of iterative reconstruction technique. Informed written consent was obtained from the patient after a discussion of the risks, benefits and alternatives to treatment. The patient was placed prone on the CT gantry and a pre procedural CT was performed re-demonstrating the known abscess/fluid collection within the deep pelvis. The procedure was planned. A timeout was performed prior to the initiation of the procedure. The RIGHT gluteus was prepped and draped in the usual sterile fashion. The overlying soft tissues were anesthetized with 1% lidocaine with epinephrine. Appropriate trajectory was planned with the use of a 22 gauge spinal needle. An 18 gauge trocar needle was advanced into the abscess/fluid collection and a short Amplatz super stiff wire was coiled within the collection. Appropriate positioning was confirmed with a limited CT scan. The tract was serially dilated allowing  placement of a 12 Fr drainage catheter. Appropriate positioning was confirmed with a limited postprocedural CT scan. 30 mL ml of serosanguineous fluid was aspirated. The tube was connected to a bulb suction and sutured in place. A dressing was placed. The patient tolerated the procedure well without immediate post procedural complication. IMPRESSION: Successful CT guided placement of a 12 Fr drainage catheter for a pelvic abscess, via RIGHT transgluteal approach, as above. Aspiration of 25 mL of serosanguineous fluid was performed. Samples were sent to the laboratory as requested by the ordering clinical team. Roanna Banning, MD [facility] Electronically Signed   By: Roanna Banning M.D.   On: 09/06/2022 16:31   CT ABDOMEN PELVIS W CONTRAST  Result Date: 09/05/2022 CLINICAL DATA:  Postop partial colectomy and colostomy formation 08/31/2022. Evaluate for bowel obstruction versus postoperative ileus. EXAM: CT ABDOMEN AND PELVIS WITH CONTRAST TECHNIQUE: Multidetector CT imaging of the abdomen and pelvis was performed using the standard protocol following bolus administration of intravenous contrast. RADIATION DOSE REDUCTION: This exam was performed according to the departmental dose-optimization program which includes automated exposure control, adjustment of the mA and/or kV according to patient size and/or use  of iterative reconstruction technique. CONTRAST:  OMNIPAQUE IOHEXOL 300 MG/ML  SOLN COMPARISON:  Abdominopelvic CT 08/26/2022 and 08/22/2022. FINDINGS: Note: Contrast bolus is suboptimal. Lower chest: Trace pleural effusions with mild bibasilar atelectasis. No confluent airspace opacity or basilar pneumothorax. The heart is mildly enlarged. There is a moderate pericardial effusion which appears similar to the previous study. Hepatobiliary: The liver is normal in density without suspicious focal abnormality. No evidence of gallstones, gallbladder  wall thickening or biliary dilatation. Pancreas: Unremarkable. No pancreatic ductal dilatation or surrounding inflammatory changes. Spleen: Normal in size without focal abnormality. Adrenals/Urinary Tract: Both adrenal glands appear normal. No evidence of urinary tract calculus, suspicious renal lesion or hydronephrosis. Bilateral renal cysts are grossly stable and do not require any specific imaging follow-up. The urinary bladder is decompressed by a Foley catheter. Stomach/Bowel: No enteric contrast administered. There are postsurgical changes at the GE junction and along the greater curvature of the stomach, the latter suggesting previous gastric sleeve resection. The stomach is decompressed. There is mild bowel dilatation at the duodenal-jejunal junction. No other significant small or large bowel distention identified. There is a descending colostomy in the left lower quadrant anterior abdominal wall and a long segment Hartman pouch. Vascular/Lymphatic: Multiple prominent retroperitoneal and base of mesentery lymph nodes are similar to the preoperative study. There is aortic and branch vessel atherosclerosis without evidence of aneurysm or large vessel occlusion. Multiple retroperitoneal phleboliths are present bilaterally. Reproductive: Hysterectomy.  No adnexal mass. Other: As above, postsurgical changes in the anterior abdominal wall with a descending colostomy. Midline abdominal incision appears incompletely closed. Rounded fluid collection in the pelvis anterior to the rectum measures up to 6.3 x 4.6 cm on image 88/2 and demonstrates thin peripheral enhancement. There are other smaller fluid collections in both pericolic gutters measuring up to 3.5 cm on the right (coronal image 34/6) and 2.3 cm on the left (axial image 56/2). No free air. Musculoskeletal: No acute or significant osseous findings. Thoracolumbar spondylosis. IMPRESSION: 1. Postsurgical changes from interval sigmoidectomy and descending  colostomy. Central pelvic fluid collection measuring up to 6.3 cm in diameter demonstrates thin peripheral enhancement and could reflect an early abscess. There are additional smaller peritoneal fluid collections. No evidence of free air or anastomotic breakdown. 2. No evidence of bowel obstruction or significant ileus. 3. Small bilateral pleural effusions with bibasilar atelectasis. Stable moderate-sized pericardial effusion. 4. Pre-existing postsurgical changes at the GE junction and stomach consistent with gastric sleeve resection. Unchanged prominent retroperitoneal lymph nodes, likely reactive. Electronically Signed   By: Carey Bullocks M.D.   On: 09/05/2022 15:46        Scheduled Meds:  acetaminophen  1,000 mg Oral Q6H   amLODipine  10 mg Oral Q supper   cloNIDine  0.1 mg Oral Q supper   enoxaparin (LOVENOX) injection  40 mg Subcutaneous Q24H   gabapentin  300 mg Oral TID   lip balm   Topical BID   pantoprazole (PROTONIX) IV  40 mg Intravenous QHS   sodium chloride flush  3 mL Intravenous Q12H   sodium chloride flush  5 mL Intracatheter Q8H   Continuous Infusions:  sodium chloride     dextrose 5 % and 0.45 % NaCl with KCl 20 mEq/L 100 mL/hr at 09/07/22 0124   methocarbamol (ROBAXIN) IV 1,000 mg (09/07/22 1022)   ondansetron (ZOFRAN) IV     piperacillin-tazobactam (ZOSYN)  IV 3.375 g (09/07/22 0600)     LOS: 16 days    Time spent: 46 minutes  spent on chart review, discussion with nursing staff, consultants, updating family and interview/physical exam; more than 50% of that time was spent in counseling and/or coordination of care.    Alvira Philips [place], DO [facility] Available via Epic secure chat 7am-7pm After these hours, please refer to coverage provider listed on [email or web address] 09/07/2022, 11:33 AM

## 2022-09-08 DIAGNOSIS — K5732 Diverticulitis of large intestine without perforation or abscess without bleeding: Secondary | ICD-10-CM | POA: Diagnosis not present

## 2022-09-08 LAB — COMPREHENSIVE METABOLIC PANEL
ALT: 10 U/L (ref 0–44)
AST: 17 U/L (ref 15–41)
Albumin: 2.2 g/dL — ABNORMAL LOW (ref 3.5–5.0)
Alkaline Phosphatase: 54 U/L (ref 38–126)
Anion gap: 5 (ref 5–15)
BUN: 9 mg/dL (ref 8–23)
CO2: 26 mmol/L (ref 22–32)
Calcium: 8.7 mg/dL — ABNORMAL LOW (ref 8.9–10.3)
Chloride: 108 mmol/L (ref 98–111)
Creatinine, Ser: 0.7 mg/dL (ref 0.44–1.00)
GFR, Estimated: >60 mL/min (ref >60)
Glucose, Bld: 116 mg/dL — ABNORMAL HIGH (ref 70–99)
Potassium: 3.8 mmol/L (ref 3.5–5.1)
Sodium: 139 mmol/L (ref 135–145)
Total Bilirubin: 0.5 mg/dL (ref 0.3–1.2)
Total Protein: 5.8 g/dL — ABNORMAL LOW (ref 6.5–8.1)

## 2022-09-08 LAB — GLUCOSE, CAPILLARY
Glucose-Capillary: 115 mg/dL — ABNORMAL HIGH (ref 70–99)
Glucose-Capillary: 116 mg/dL — ABNORMAL HIGH (ref 70–99)
Glucose-Capillary: 129 mg/dL — ABNORMAL HIGH (ref 70–99)
Glucose-Capillary: 87 mg/dL (ref 70–99)
Glucose-Capillary: 112 mg/dL — ABNORMAL HIGH (ref 70–99)
Glucose-Capillary: 107 mg/dL — ABNORMAL HIGH (ref 70–99)

## 2022-09-08 LAB — MAGNESIUM: Magnesium: 2 mg/dL (ref 1.7–2.4)

## 2022-09-08 LAB — TRIGLYCERIDES: Triglycerides: 55 mg/dL (ref <150)

## 2022-09-08 LAB — PHOSPHORUS: Phosphorus: 4.2 mg/dL (ref 2.5–4.6)

## 2022-09-08 MED ORDER — KCL IN DEXTROSE-NACL 20-5-0.45 MEQ/L-%-% IV SOLN
INTRAVENOUS | Status: DC
  Filled 2022-09-08 (×6): qty 1000

## 2022-09-08 MED ORDER — TRAVASOL 10 % IV SOLN
INTRAVENOUS | Status: AC
  Filled 2022-09-08: qty 957.6

## 2022-09-08 NOTE — Progress Notes (Signed)
8 Days Post-Op   Subjective/Chief Complaint: Patient feels much better.  She is hungry has no nausea or vomiting.   Objective: Vital signs in last 24 hours: Temp:  [98 F (36.7 C)-99.3 F (37.4 C)] 98 F (36.7 C) (09/16 0415) Pulse Rate:  [64-83] 64 (09/16 0415) Resp:  [19-20] 20 (09/16 0415) BP: (129-153)/(84-91) 129/84 (09/16 0415) SpO2:  [96 %-98 %] 98 % (09/16 0415) Last BM Date : 09/07/22  Intake/Output from previous day: 09/15 0701 - 09/16 0700 In: 1719.1 [P.O.:60; I.V.:1185.4; IV Piggyback:473.7] Out: 1360 [Urine:1350; Drains:10] Intake/Output this shift: No intake/output data recorded.   Gen:  Alert, NAD, pleasant Abd: Soft, mild distension, some llq and tenderness around her incisions. Hypoactive BS. Wound and stoma evaluated with WOC RN today - midline wound is pink and viable with areas of granulation; LUQ stoma with some necrosis, WOC RN digitized and it is patent, there is mucocutaneous separation from the 12-6 o'clock position with underlying fascia in tact.              R transgluteal drain - cloudy SS Lab Results:  Recent Labs    09/06/22 0401 09/07/22 0416  WBC 9.7 9.7  HGB 11.1* 10.3*  HCT 33.5* 31.1*  PLT 367 338   BMET Recent Labs    09/07/22 0416 09/08/22 0331  NA 134* 139  K 3.8 3.8  CL 101 108  CO2 26 26  GLUCOSE 115* 116*  BUN 7* 9  CREATININE 0.61 0.70  CALCIUM 8.4* 8.7*   PT/INR Recent Labs    09/06/22 0401  LABPROT 16.2*  INR 1.3*   ABG No results for input(s): "PHART", "HCO3" in the last 72 hours.  Invalid input(s): "PCO2", "PO2"  Studies/Results: [place] EKG SITE RITE  Result Date: 09/07/2022 If Site Rite image not attached, placement could not be confirmed due to current cardiac rhythm.  CT GUIDED VISCERAL FLUID DRAIN BY PERC CATH  Result Date: 09/06/2022 INDICATION: History of diverticulitis s/p colectomy complicated by postoperative pelvic abscess. EXAM: CT-GUIDED PELVIC DRAINAGE CATHETER PLACEMENT COMPARISON:  None  Available. MEDICATIONS: The patient is currently admitted to the hospital and receiving intravenous antibiotics. The antibiotics were administered within an appropriate time frame prior to the initiation of the procedure. ANESTHESIA/SEDATION: Moderate (conscious) sedation was employed during this procedure. A total of Versed 2 mg and Fentanyl 100 mcg was administered intravenously. Moderate Sedation Time: 23 minutes. The patient's level of consciousness and vital signs were monitored continuously by radiology nursing throughout the procedure under my direct supervision. CONTRAST:  None COMPLICATIONS: None immediate. PROCEDURE: RADIATION DOSE REDUCTION: This exam was performed according to the departmental dose-optimization program which includes automated exposure control, adjustment of the mA and/or kV according to patient size and/or use of iterative reconstruction technique. Informed written consent was obtained from the patient after a discussion of the risks, benefits and alternatives to treatment. The patient was placed prone on the CT gantry and a pre procedural CT was performed re-demonstrating the known abscess/fluid collection within the deep pelvis. The procedure was planned. A timeout was performed prior to the initiation of the procedure. The RIGHT gluteus was prepped and draped in the usual sterile fashion. The overlying soft tissues were anesthetized with 1% lidocaine with epinephrine. Appropriate trajectory was planned with the use of a 22 gauge spinal needle. An 18 gauge trocar needle was advanced into the abscess/fluid collection and a short Amplatz super stiff wire was coiled within the collection. Appropriate positioning was confirmed with a limited CT scan.  The tract was serially dilated allowing placement of a 12 Fr drainage catheter. Appropriate positioning was confirmed with a limited postprocedural CT scan. 30 mL ml of serosanguineous fluid was aspirated. The tube was connected to a bulb  suction and sutured in place. A dressing was placed. The patient tolerated the procedure well without immediate post procedural complication. IMPRESSION: Successful CT guided placement of a 12 Fr drainage catheter for a pelvic abscess, via RIGHT transgluteal approach, as above. Aspiration of 25 mL of serosanguineous fluid was performed. Samples were sent to the laboratory as requested by the ordering clinical team. Roanna Banning, MD [facility] Electronically Signed   By: Roanna Banning M.D.   On: 09/06/2022 16:31    Anti-infectives: Anti-infectives (From admission, onward)    Start     Dose/Rate Route Frequency Ordered Stop   09/06/22 1400  piperacillin-tazobactam (ZOSYN) IVPB 3.375 g        3.375 g 12.5 mL/hr over 240 Minutes Intravenous Every 8 hours 09/06/22 1302     09/02/22 1400  ceFEPIme (MAXIPIME) 2 g in sodium chloride 0.9 % 100 mL IVPB        2 g 200 mL/hr over 30 Minutes Intravenous Every 8 hours 09/02/22 1015 09/05/22 0103   08/31/22 0600  cefoTEtan (CEFOTAN) 2 g in sodium chloride 0.9 % 100 mL IVPB  Status:  Discontinued        2 g 200 mL/hr over 30 Minutes Intravenous On call to O.R. 08/30/22 1316 08/31/22 1505   08/26/22 1400  ceFEPIme (MAXIPIME) 2 g in sodium chloride 0.9 % 100 mL IVPB  Status:  Discontinued        2 g 200 mL/hr over 30 Minutes Intravenous Every 8 hours 08/26/22 0848 09/02/22 1015   08/23/22 0400  cefTRIAXone (ROCEPHIN) 2 g in sodium chloride 0.9 % 100 mL IVPB  Status:  Discontinued        2 g 200 mL/hr over 30 Minutes Intravenous Every 24 hours 08/22/22 0345 08/26/22 0848   08/22/22 1400  metroNIDAZOLE (FLAGYL) IVPB 500 mg        500 mg 100 mL/hr over 60 Minutes Intravenous Every 12 hours 08/22/22 0345 09/05/22 0449   08/22/22 0100  cefTRIAXone (ROCEPHIN) 2 g in sodium chloride 0.9 % 100 mL IVPB        2 g 200 mL/hr over 30 Minutes Intravenous  Once 08/22/22 0047 08/22/22 0226   08/22/22 0100   metroNIDAZOLE (FLAGYL) IVPB 500 mg        500 mg 100 mL/hr over 60 Minutes Intravenous  Once 08/22/22 0047 08/22/22 0412       Assessment/Plan: POD 8 s/p Exploratory laparotomy, sigmoid colectomy, descending colostomy, VAC dressing placement for Diverticulitis with Colonic Obstruction by Dr. Gerrit Friends on 08/31/22 - Path benign  - CT 9/13 w/ central pelvic fluid collection measuring 6.3cm. s/p IR drainage 9/14 - follow Cx, NGTD - Patient with ongoing nausea and no bowel function. CT without significant dilated small bowel. Monitor stoma closely.  - start p.o. intake since nausea vomiting better.  Continue PICC/TPN - Continue wound vac, M/W/F vac changes - WOCN following for new ostomy education - Mobilize, continue therapies. Currently recommended for [facility] PT/OT.  - TOC consult for [facility] PT/OT/RN - Daily labs   FEN -clear liquid diet, IVF per TRH VTE - SCDs, Lovenox ID - Restart Zosyn Foley - In place for urinary retention. Consider TOV tomorrow   - Pr TRH -  HTN GERD  Electrolyte abnormalities    LOS: 17 days    Joyice Faster Verdon Ferrante 09/08/2022

## 2022-09-08 NOTE — Progress Notes (Signed)
PROGRESS NOTE    Kristin Gibson  TIR:[id number] DOB: September 27, 1956 DOA: 08/21/2022 PCP: Woodroe Chen, MD    Brief Narrative:   Kristin Gibson is a 66 y.o. female with past medical history significant for morbid obesity s/p gastric sleeve surgery February 2023, recurrent diverticulitis in which she recently completed 2 rounds of antibiotics who presented to [facility] ED on 8/29 with progressive/recurrent abdominal pain, fever/chills.  Work-up in the ED notable for recurrent sigmoid diverticulitis.  Patient was started on IV antibiotics.  General surgery was consulted.  Initially admitted to the hospital service.  Patient underwent exploratory laparotomy, sigmoid colectomy, descending colostomy with wound VAC placement by Dr. Gerrit Friends on 08/31/2022.  Care was transferred to the general surgery service on 09/03/2022.  Assessment & Plan:   Recurrent sigmoid diverticulitis Postoperative pelvic abscess Patient presenting to ED with progressive abdominal pain associate with fever/chills.  Work-up revealing recurrent sigmoid diverticulitis.  Patient recently completed 2 courses of outpatient antibiotics which she has failed.  General surgery was consulted and patient underwent exploratory laparotomy with sigmoid colectomy, descending colostomy with wound VAC placement by Dr. Gerrit Friends on 08/31/2022.  Repeat CT abdomen/pelvis with contrast 9/13 notable for central pelvic fluid collection measuring 6.3 cm with thin peripheral enhancement consistent with early abscess and additional smaller peritoneal fluid collections, no free air or antibiotics static breakdown, no bowel obstruction or ileus. --Started on TPN 9/15, starting clear liquid diet 9/16 per [facility] --Zosyn --s/p IR pelvic drain placement 9/14 --D5 half NS with 20 mEq KCL at 155mL/h --Follow-up cultures from drain; no growth less than 24 hours --Wound care, pain control, diet advancement per general surgery; started on TPN today  Essential  hypertension -- Amlodipine 10 mg p.o. daily -- Clonidine 0.1 mg p.o. qPM -- Hydralazine 5 mg IV q4h PRN SBP >170 or DBP >95  GERD --Protonix 40 mg IV daily  Hypokalemia Hypomagnesemia Potassium 3.8 this morning.  Continue monitor electrolytes intermittently  Morbid obesity Body mass index is 36.48 kg/m.  Discussed with patient needs for aggressive lifestyle changes/weight loss as this complicates all facets of care.  Outpatient follow-up with PCP.   DVT prophylaxis: SCD's Start: 08/30/22 1310    Code Status: Full Code Family Communication: No family present at bedside this morning  Disposition Plan:  Level of care: Med-Surg Status is: Inpatient Remains inpatient appropriate because: Per primary, general surgery     Procedures:  Exploratory laparotomy with sigmoid colectomy, descending colostomy with wound VAC placement, Dr. Gerrit Friends on 08/31/2022.   Antimicrobials:  Ceftriaxone 8/29 - 8/2 Metronidazole 8/29 - 9/12 Cefepime 9/3 - 9/12 Zosyn 9/14>>   Subjective: Patient seen examined bedside, resting comfortably.  Lying in bed.  No family present.  Feels overall much improved today with increased "energy" after starting TPN yesterday.  Discomfort from drain site much improved as well.  Hopes she can start eating something soon.  Blood pressure remains controlled.  Patient with no other complaints or concerns at this time.  Denies headache, no dizziness, no chest pain, no palpitations, no shortness of breath, no fever/chills/night sweats, no nausea/vomiting, no focal weakness, no fatigue, no cough/congestion, no paresthesias.  No acute events overnight per nursing staff.  Objective: Vitals:   09/07/22 0500 09/07/22 1440 09/07/22 1958 09/08/22 0415  BP: 129/81 (!) 153/89 (!) 146/91 129/84  Pulse: 92 80 83 64  Resp: 18 20 19 20   Temp: 98.2 F (36.8 C) 99.2 F (37.3 C) 99.3 F (37.4 C) 98 F (36.7 C)  TempSrc: Oral Oral  Oral Oral  SpO2: 98% 96% 97% 98%  Weight:       Height:        Intake/Output Summary (Last 24 hours) at 09/08/2022 1114 Last data filed at 09/08/2022 0700 Gross per 24 hour  Intake 1719.05 ml  Output 1360 ml  Net 359.05 ml   Filed Weights   08/21/22 2220 08/31/22 0815  Weight: 112.5 kg 112 kg    Examination:  Physical Exam: GEN: NAD, alert and oriented x 3, obese HEENT: NCAT, PERRL, EOMI, sclera clear, MMM PULM: CTAB w/o wheezes/crackles, normal respiratory effort, on room air CV: RRR w/o M/G/R GI: abd soft, NTND, wound VAC and ostomy noted, JP drain noted GU: Foley catheter noted in place, draining clear yellow urine in collection bag MSK: no peripheral edema, moves all extremities independently NEURO: CN II-XII intact, no focal deficits, sensation to light touch intact PSYCH: normal mood/affect Integumentary: Surgical incision site noted with wound VAC in place, ostomy noted in place, JP drain noted, otherwise no other concerning rashes/lesions/wounds on exposed areas     Data Reviewed: I have personally reviewed following labs and imaging studies  CBC: Recent Labs  Lab 09/03/22 1012 09/04/22 0343 09/05/22 0351 09/06/22 0401 09/07/22 0416  WBC 15.5* 13.3* 10.2 9.7 9.7  NEUTROABS 13.5* 11.0* 8.0* 7.3  --   HGB 10.2* 9.6* 10.6* 11.1* 10.3*  HCT 32.7* 30.2* 33.5* 33.5* 31.1*  MCV 90.1 89.9 89.8 84.8 85.7  PLT 336 298 311 367 338   Basic Metabolic Panel: Recent Labs  Lab 09/03/22 1012 09/04/22 0343 09/05/22 0351 09/06/22 0401 09/07/22 0416 09/08/22 0331  NA 138 137 136 134* 134* 139  K 3.2* 3.6 2.9* 3.1* 3.8 3.8  CL 109 107 104 100 101 108  CO2 23 25 24 27 26 26   GLUCOSE 110* 100* 122* 132* 115* 116*  BUN 16 13 7* 6* 7* 9  CREATININE 0.86 0.79 0.74 0.63 0.61 0.70  CALCIUM 8.8* 8.2* 8.3* 8.7* 8.4* 8.7*  MG 2.1  --   --  1.5* 2.0 2.0  PHOS  --   --   --  2.7  --  4.2   GFR: Estimated Creatinine Clearance: 92.3 mL/min (by C-G formula based on SCr of 0.7 mg/dL). Liver Function Tests: Recent Labs   Lab 09/08/22 0331  AST 17  ALT 10  ALKPHOS 54  BILITOT 0.5  PROT 5.8*  ALBUMIN 2.2*   No results for input(s): "LIPASE", "AMYLASE" in the last 168 hours. No results for input(s): "AMMONIA" in the last 168 hours. Coagulation Profile: Recent Labs  Lab 09/06/22 0401  INR 1.3*   Cardiac Enzymes: No results for input(s): "CKTOTAL", "CKMB", "CKMBINDEX", "TROPONINI" in the last 168 hours. BNP (last 3 results) No results for input(s): "PROBNP" in the last 8760 hours. HbA1C: Recent Labs    09/07/22 1138  HGBA1C 6.1*   CBG: Recent Labs  Lab 09/07/22 1703 09/07/22 2047 09/08/22 0110 09/08/22 0408 09/08/22 0737  GLUCAP 100* 118* 115* 116* 129*   Lipid Profile: Recent Labs    09/08/22 0331  TRIG 55   Thyroid Function Tests: No results for input(s): "TSH", "T4TOTAL", "FREET4", "T3FREE", "THYROIDAB" in the last 72 hours. Anemia Panel: No results for input(s): "VITAMINB12", "FOLATE", "FERRITIN", "TIBC", "IRON", "RETICCTPCT" in the last 72 hours. Sepsis Labs: No results for input(s): "PROCALCITON", "LATICACIDVEN" in the last 168 hours.  Recent Results (from the past 240 hour(s))  Aerobic/Anaerobic Culture w Gram Stain (surgical/deep wound)     Status: None (Preliminary result)  Collection Time: 09/06/22 12:53 PM   Specimen: Abdomen; Abscess  Result Value Ref Range Status   Specimen Description   Final    ABDOMEN Performed at [facility], [address] [place], [place] [address]    Special Requests   Final    NONE Performed at [facility], [address] [place], [place] [address]    Gram Stain   Final    MODERATE WBC PRESENT,BOTH PMN AND MONONUCLEAR NO ORGANISMS SEEN    Culture   Final    NO GROWTH < 24 HOURS Performed at [facility], [address] [place], [place] [address]    Report Status PENDING  Incomplete         Radiology Studies: US EKG SITE RITE  Result Date: 09/07/2022 If Site Rite image  not attached, placement could not be confirmed due to current cardiac rhythm.  CT GUIDED VISCERAL FLUID DRAIN BY PERC CATH  Result Date: 09/06/2022 INDICATION: History of diverticulitis s/p colectomy complicated by postoperative pelvic abscess. EXAM: CT-GUIDED PELVIC DRAINAGE CATHETER PLACEMENT COMPARISON:  None Available. MEDICATIONS: The patient is currently admitted to the hospital and receiving intravenous antibiotics. The antibiotics were administered within an appropriate time frame prior to the initiation of the procedure. ANESTHESIA/SEDATION: Moderate (conscious) sedation was employed during this procedure. A total of Versed 2 mg and Fentanyl 100 mcg was administered intravenously. Moderate Sedation Time: 23 minutes. The patient's level of consciousness and vital signs were monitored continuously by radiology nursing throughout the procedure under my direct supervision. CONTRAST:  None COMPLICATIONS: None immediate. PROCEDURE: RADIATION DOSE REDUCTION: This exam was performed according to the departmental dose-optimization program which includes automated exposure control, adjustment of the mA and/or kV according to patient size and/or use of iterative reconstruction technique. Informed written consent was obtained from the patient after a discussion of the risks, benefits and alternatives to treatment. The patient was placed prone on the CT gantry and a pre procedural CT was performed re-demonstrating the known abscess/fluid collection within the deep pelvis. The procedure was planned. A timeout was performed prior to the initiation of the procedure. The RIGHT gluteus was prepped and draped in the usual sterile fashion. The overlying soft tissues were anesthetized with 1% lidocaine with epinephrine. Appropriate trajectory was planned with the use of a 22 gauge spinal needle. An 18 gauge trocar needle was advanced into the abscess/fluid collection and a short Amplatz super stiff wire was coiled within  the collection. Appropriate positioning was confirmed with a limited CT scan. The tract was serially dilated allowing placement of a 12 Fr drainage catheter. Appropriate positioning was confirmed with a limited postprocedural CT scan. 30 mL ml of serosanguineous fluid was aspirated. The tube was connected to a bulb suction and sutured in place. A dressing was placed. The patient tolerated the procedure well without immediate post procedural complication. IMPRESSION: Successful CT guided placement of a 12 Fr drainage catheter for a pelvic abscess, via RIGHT transgluteal approach, as above. Aspiration of 25 mL of serosanguineous fluid was performed. Samples were sent to the laboratory as requested by the ordering clinical team. Roanna BanningJon Mugweru, MD [facility] Electronically Signed   By: Roanna BanningJon  Mugweru M.D.   On: 09/06/2022 16:31        Scheduled Meds:  acetaminophen  1,000 mg Oral Q6H   amLODipine  10 mg Oral Q supper   Chlorhexidine Gluconate Cloth  6 each Topical Daily   cloNIDine  0.1 mg Oral Q supper   gabapentin  300 mg Oral TID   insulin aspart  0-15 Units Subcutaneous Q4H   lip balm   Topical BID   pantoprazole (PROTONIX) IV  40 mg Intravenous QHS   sodium chloride flush  10-40 mL Intracatheter Q12H   sodium chloride flush  3 mL Intravenous Q12H   sodium chloride flush  5 mL Intracatheter Q8H   Continuous Infusions:  sodium chloride     dextrose 5 % and 0.45 % NaCl with KCl 20 mEq/L 60 mL/hr at 09/08/22 0112   dextrose 5 % and 0.45 % NaCl with KCl 20 mEq/L     methocarbamol (ROBAXIN) IV 1,000 mg (09/08/22 0946)   ondansetron (ZOFRAN) IV     piperacillin-tazobactam (ZOSYN)  IV 3.375 g (09/08/22 0519)   TPN ADULT (ION) 40 mL/hr at 09/07/22 1757   TPN ADULT (ION)       LOS: 17 days    Time spent: 46 minutes spent on chart review, discussion with nursing staff, consultants, updating family and interview/physical exam; more than  50% of that time was spent in counseling and/or coordination of care.    Ulysse Siemen J [place] DO [facility] Available via Epic secure chat 7am-7pm After these hours, please refer to coverage provider listed on [email or web address] 09/08/2022, 11:14 AM

## 2022-09-08 NOTE — Progress Notes (Signed)
PHARMACY - TOTAL PARENTERAL NUTRITION CONSULT NOTE   Indication: Prolonged ileus  Patient Measurements: Height: 5\' 9"  (175.3 cm) Weight: 112 kg (247 lb) IBW/kg (Calculated) : 66.2 TPN AdjBW (KG): 77.7 Body mass index is 36.48 kg/m. 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  9/8: Exploratory laparotomy, sigmoid colectomy, descending colostomy, VAC dressing placement for Diverticulitis with Colonic Obstruction  -9/13: CT 9/13 w/ central pelvic fluid collection measuring 6.3cm -9/14: IR drainage of fluid collection -9/15: Ongoing nausea and some belching, no vomiting. No gas/stool in ostomy pouch. See RD note 9/14. Con't NPO and start TPN for prolonged ileus.  Glucose / Insulin: pre-DM with A1C 6 (01/2022). Will need to start TPN - Glucose has been < 150  - 2 units of insulin used in last 24 hours  Electrolytes: Electrolytes are WNL including CorrCa which is 10.1  Renal: Scr <1, UOP ok Hepatic: albumin is 2.2 . LFT's WNL  Intake / Output; MIVF: NPO.  - D5 1/2 NS with 20 meq of KCl at 75ml/hr GI Imaging: -9/13: CT 9/13 w/ central pelvic fluid collection measuring 6.3cm  GI Surgeries / Procedures:  9/8: Exploratory laparotomy, sigmoid colectomy, descending colostomy, VAC dressing placement for Diverticulitis with Colonic Obstruction  -9/14: IR drainage of fluid collection/abscess  Central access: PICC ordered 09/06/22 TPN start date: 09/06/22  Nutritional Goals: Goal TPN rate is 70 mL/hr (provides 95 g of protein and 1774 kcals per day)  RD Assessment: Estimated Needs Total Energy Estimated Needs:  1700-1900 Total Protein Estimated Needs: 95-105g Total Fluid Estimated Needs: 1.9L/day  Current Nutrition:  8/29: admitted, NPO 9/1: CLD 9/2: NPO 9/3: CLD 9/5: FLD 9/7: NPO 9/8: s/p ex lap, sig colectomy, colostomy, VAC 9/9: CLD -> FLD 9/13: NPO  Plan:  Increase TPN to 70 mL/hr at 1800, the target rate  Electrolytes in TPN:  Na 39mEq/L K inc to 43mEq/L Ca 30mEq/L  Mg 78mEq/L  Phos dec to 34mmol/L  Cl:Ac 1:1 (standard) Add standard MVI and trace elements to TPN Initiate Moderate SSI q4h and adjust as needed  BMP, magnesium, phosphorus with AM labs  Reduce MIVF to 30 mL/hr at 1800 Monitor TPN labs on Mon/Thurs, and PRN  Royetta Asal, PharmD, BCPS 09/08/2022 11:03 AM

## 2022-09-09 DIAGNOSIS — K5732 Diverticulitis of large intestine without perforation or abscess without bleeding: Secondary | ICD-10-CM | POA: Diagnosis not present

## 2022-09-09 LAB — PHOSPHORUS: Phosphorus: 3.6 mg/dL (ref 2.5–4.6)

## 2022-09-09 LAB — BASIC METABOLIC PANEL
Anion gap: 5 (ref 5–15)
BUN: 12 mg/dL (ref 8–23)
CO2: 24 mmol/L (ref 22–32)
Calcium: 8.4 mg/dL — ABNORMAL LOW (ref 8.9–10.3)
Chloride: 106 mmol/L (ref 98–111)
Creatinine, Ser: 0.5 mg/dL (ref 0.44–1.00)
GFR, Estimated: >60 mL/min (ref >60)
Glucose, Bld: 113 mg/dL — ABNORMAL HIGH (ref 70–99)
Potassium: 3.9 mmol/L (ref 3.5–5.1)
Sodium: 135 mmol/L (ref 135–145)

## 2022-09-09 LAB — MAGNESIUM: Magnesium: 1.8 mg/dL (ref 1.7–2.4)

## 2022-09-09 LAB — GLUCOSE, CAPILLARY
Glucose-Capillary: 128 mg/dL — ABNORMAL HIGH (ref 70–99)
Glucose-Capillary: 108 mg/dL — ABNORMAL HIGH (ref 70–99)
Glucose-Capillary: 108 mg/dL — ABNORMAL HIGH (ref 70–99)
Glucose-Capillary: 115 mg/dL — ABNORMAL HIGH (ref 70–99)
Glucose-Capillary: 112 mg/dL — ABNORMAL HIGH (ref 70–99)
Glucose-Capillary: 128 mg/dL — ABNORMAL HIGH (ref 70–99)

## 2022-09-09 MED ORDER — TRAVASOL 10 % IV SOLN
INTRAVENOUS | Status: AC
  Filled 2022-09-09: qty 957.6

## 2022-09-09 MED ORDER — TRAZODONE HCL 50 MG PO TABS
50.0000 mg | ORAL_TABLET | Freq: Every day | ORAL | Status: DC
  Administered 2022-09-09 – 2022-09-19 (×3): 50 mg via ORAL
  Filled 2022-09-09 (×5): qty 1

## 2022-09-09 MED ORDER — VANCOMYCIN HCL 2000 MG/400ML IV SOLN
2000.0000 mg | Freq: Once | INTRAVENOUS | Status: AC
  Administered 2022-09-09: 2000 mg via INTRAVENOUS
  Filled 2022-09-09: qty 400

## 2022-09-09 MED ORDER — MAGNESIUM SULFATE 2 GM/50ML IV SOLN
2.0000 g | Freq: Once | INTRAVENOUS | Status: AC
  Administered 2022-09-09: 2 g via INTRAVENOUS
  Filled 2022-09-09: qty 50

## 2022-09-09 MED ORDER — VANCOMYCIN HCL IN DEXTROSE 1-5 GM/200ML-% IV SOLN
1000.0000 mg | Freq: Two times a day (BID) | INTRAVENOUS | Status: AC
  Administered 2022-09-10 – 2022-09-14 (×10): 1000 mg via INTRAVENOUS
  Filled 2022-09-09 (×10): qty 200

## 2022-09-09 NOTE — Progress Notes (Signed)
Pharmacy Antibiotic Note  Kristin Gibson is a 66 y.o. female admitted on 08/21/2022 with  IAI .  Pharmacy has been consulted for vancomycin  dosing. Vancomycin started after abdominal abscess culture gre ampicillin resistant E. Faecium.   Plan: Vancomycin 2000 mg IV x1 then 1000 mg IV q12h ( AUC 555 with SCr rounded up 0.8)  Zosyn 3.375 gr IV q8h per MD   Height: 5\' 9"  (175.3 cm) Weight: 112 kg (247 lb) IBW/kg (Calculated) : 66.2  Temp (24hrs), Avg:98.2 F (36.8 C), Min:98 F (36.7 C), Max:98.4 F (36.9 C)  Recent Labs  Lab 09/03/22 1012 09/04/22 0343 09/05/22 0351 09/06/22 0401 09/07/22 0416 09/08/22 0331 09/09/22 0553  WBC 15.5* 13.3* 10.2 9.7 9.7  --   --   CREATININE 0.86 0.79 0.74 0.63 0.61 0.70 0.50    Estimated Creatinine Clearance: 92.3 mL/min (by C-G formula based on SCr of 0.5 mg/dL).    Allergies  Allergen Reactions   Benazepril Swelling   Sulfa Antibiotics Nausea Only   Elemental Sulfur Nausea Only    Antimicrobials this admission: Cefepime 9/3>>9/12 Flagyl 8/30>9/14 Rocephin 8/30>9/3 Zosyn 9/14>> Vancomycin 9/17 >>   Dose adjustments this admission:   Microbiology results: 9/14 abdominal abscess: E. Faecium ( R to ampicillin, S to vancomycin )   Thank you for allowing pharmacy to be a part of this patient's care.   Royetta Asal, PharmD, BCPS 09/09/2022 4:23 PM

## 2022-09-09 NOTE — Progress Notes (Signed)
PHARMACY - TOTAL PARENTERAL NUTRITION CONSULT NOTE   Indication: Prolonged ileus  Patient Measurements: Height: 5\' 9"  (175.3 cm) Weight: 112 kg (247 lb) IBW/kg (Calculated) : 66.2 TPN AdjBW (KG): 77.7 Body mass index is 36.48 kg/m. 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  9/8: Exploratory laparotomy, sigmoid colectomy, descending colostomy, VAC dressing placement for Diverticulitis with Colonic Obstruction  -9/13: CT 9/13 w/ central pelvic fluid collection measuring 6.3cm -9/14: IR drainage of fluid collection -9/15: Ongoing nausea and some belching, no vomiting. No gas/stool in ostomy pouch. See RD note 9/14. Con't NPO and start TPN for prolonged ileus.  Glucose / Insulin: pre-DM with A1C 6 (01/2022).  - Glucose has been < 150, controlled  - 4 units of insulin used in last 24 hours  Electrolytes: Electrolytes are WNL including CorrCa which is 9.8  Renal: Scr <1, UOP ok Hepatic: albumin is 2.2 . LFT's WNL  Intake / Output; MIVF: NPO.  - D5 1/2 NS with 20 meq of KCl at 45ml/hr GI Imaging: -9/13: CT 9/13 w/ central pelvic fluid collection measuring 6.3cm  GI Surgeries / Procedures:  9/8: Exploratory laparotomy, sigmoid colectomy, descending colostomy, VAC dressing placement for Diverticulitis with Colonic Obstruction  -9/14: IR drainage of fluid collection/abscess  Central access: PICC ordered 09/06/22 TPN start date: 09/06/22  Nutritional Goals: Goal TPN rate is 70 mL/hr (provides 95 g of protein and 1774 kcals per day)  RD Assessment: Estimated Needs Total Energy Estimated Needs: 1700-1900 Total  Protein Estimated Needs: 95-105g Total Fluid Estimated Needs: 1.9L/day  Current Nutrition:  8/29: admitted, NPO 9/1: CLD 9/2: NPO 9/3: CLD 9/5: FLD 9/7: NPO 9/8: s/p ex lap, sig colectomy, colostomy, VAC 9/9: CLD -> FLD 9/13: NPO  Now - Magnesium sulfate 2 gr IV x1    Plan:  Continue TPN at 70 mL/hr at 1800, the target rate  Electrolytes in TPN:  Na 56mEq/L K inc to 3mEq/L Ca 16mEq/L  Mg inc to 22mEq/L  Phos 51mmol/L  Cl:Ac 1:1 (standard) Add standard MVI and trace elements to TPN Continue Moderate SSI q4h and adjust as needed  Continue MIVF to 30 mL/hr at 1800 Monitor TPN labs on Mon/Thurs, and PRN  Royetta Asal, PharmD, BCPS 09/09/2022 9:36 AM

## 2022-09-09 NOTE — Progress Notes (Signed)
PROGRESS NOTE    Kristin Gibson  QMV:[id number] DOB: Jul 20, 1956 DOA: 08/21/2022 PCP: Woodroe Chen, MD    Brief Narrative:   Kristin Gibson is a 66 y.o. female with past medical history significant for morbid obesity s/p gastric sleeve surgery February 2023, recurrent diverticulitis in which she recently completed 2 rounds of antibiotics who presented to [facility] ED on 8/29 with progressive/recurrent abdominal pain, fever/chills.  Work-up in the ED notable for recurrent sigmoid diverticulitis.  Patient was started on IV antibiotics.  General surgery was consulted.  Initially admitted to the hospital service.  Patient underwent exploratory laparotomy, sigmoid colectomy, descending colostomy with wound VAC placement by Dr. Gerrit Friends on 08/31/2022.  Care was transferred to the general surgery service on 09/03/2022.  Assessment & Plan:   Recurrent sigmoid diverticulitis Postoperative pelvic abscess w/ enterococcus faecium Patient presenting to ED with progressive abdominal pain associate with fever/chills.  Work-up revealing recurrent sigmoid diverticulitis.  Patient recently completed 2 courses of outpatient antibiotics which she has failed.  General surgery was consulted and patient underwent exploratory laparotomy with sigmoid colectomy, descending colostomy with wound VAC placement by Dr. Gerrit Friends on 08/31/2022.  Repeat CT abdomen/pelvis with contrast 9/13 notable for central pelvic fluid collection measuring 6.3 cm with thin peripheral enhancement consistent with early abscess and additional smaller peritoneal fluid collections, no free air or antibiotics static breakdown, no bowel obstruction or ileus. --Started on TPN 9/15, starting clear liquid diet 9/16 per CCS --Zosyn --s/p IR pelvic drain placement 9/14 --Culture from drain placement 9/14 with few Enterococcus faecium, pending susceptibilities --D5 half NS with 20 mEq KCL at 136mL/h --Follow-up cultures from drain; no growth less than 24  hours --Wound care, pain control, diet advancement per general surgery; started on TPN today  Essential hypertension -- Amlodipine 10 mg p.o. daily -- Clonidine 0.1 mg p.o. qPM -- Hydralazine 5 mg IV q4h PRN SBP >170 or DBP >95  GERD --Protonix 40 mg IV daily  Hypokalemia Hypomagnesemia Potassium 3.9 this morning.  Continue monitor electrolytes intermittently  Morbid obesity Body mass index is 36.48 kg/m.  Discussed with patient needs for aggressive lifestyle changes/weight loss as this complicates all facets of care.  Outpatient follow-up with PCP.   DVT prophylaxis: SCD's Start: 08/30/22 1310    Code Status: Full Code Family Communication: No family present at bedside this morning  Disposition Plan:  Level of care: Med-Surg Status is: Inpatient Remains inpatient appropriate because: Per primary, general surgery     Procedures:  Exploratory laparotomy with sigmoid colectomy, descending colostomy with wound VAC placement, Dr. Gerrit Friends on 08/31/2022.   Antimicrobials:  Ceftriaxone 8/29 - 8/2 Metronidazole 8/29 - 9/12 Cefepime 9/3 - 9/12 Zosyn 9/14>>   Subjective: Patient seen examined bedside, resting comfortably.  Lying in bed.  No family present.  Complaining about some abdominal discomfort, rated at 5-6 out of 10.  Denies nausea/vomiting.  Tolerating clear liquid diet.  Hopeful for further advancement of diet today.  Blood pressure remains controlled.  Continues on TPN.  No other specific questions or concerns at this time. Denies headache, no dizziness, no chest pain, no palpitations, no shortness of breath, no fever/chills/night sweats, no nausea/vomiting, no focal weakness, no fatigue, no cough/congestion, no paresthesias.  No acute events overnight per nursing staff.  Objective: Vitals:   09/08/22 1351 09/08/22 1956 09/08/22 2022 09/09/22 0414  BP: (!) 142/84 117/77  125/75  Pulse: 81 72 72 76  Resp: 18 19 18 19   Temp: 98.2 F (36.8 C) 98.4 F (36.9  C)  98 F  (36.7 C)  TempSrc: Oral Oral  Oral  SpO2: 98% 100% 98% 98%  Weight:      Height:        Intake/Output Summary (Last 24 hours) at 09/09/2022 0954 Last data filed at 09/09/2022 0700 Gross per 24 hour  Intake 3201.67 ml  Output 2885 ml  Net 316.67 ml   Filed Weights   08/21/22 2220 08/31/22 0815  Weight: 112.5 kg 112 kg    Examination:  Physical Exam: GEN: NAD, alert and oriented x 3, obese HEENT: NCAT, PERRL, EOMI, sclera clear, MMM PULM: CTAB w/o wheezes/crackles, normal respiratory effort, on room air CV: RRR w/o M/G/R GI: abd soft, NTND, wound VAC and ostomy noted with brown liquid stool noted in ostomy bag, JP drain noted GU: Foley catheter noted in place, draining clear yellow urine in collection bag MSK: no peripheral edema, moves all extremities independently NEURO: CN II-XII intact, no focal deficits, sensation to light touch intact PSYCH: normal mood/affect Integumentary: Surgical incision site noted with wound VAC in place, ostomy noted in place, JP drain noted, otherwise no other concerning rashes/lesions/wounds on exposed areas     Data Reviewed: I have personally reviewed following labs and imaging studies  CBC: Recent Labs  Lab 09/03/22 1012 09/04/22 0343 09/05/22 0351 09/06/22 0401 09/07/22 0416  WBC 15.5* 13.3* 10.2 9.7 9.7  NEUTROABS 13.5* 11.0* 8.0* 7.3  --   HGB 10.2* 9.6* 10.6* 11.1* 10.3*  HCT 32.7* 30.2* 33.5* 33.5* 31.1*  MCV 90.1 89.9 89.8 84.8 85.7  PLT 336 298 311 367 338   Basic Metabolic Panel: Recent Labs  Lab 09/03/22 1012 09/04/22 0343 09/05/22 0351 09/06/22 0401 09/07/22 0416 09/08/22 0331 09/09/22 0553  NA 138   < > 136 134* 134* 139 135  K 3.2*   < > 2.9* 3.1* 3.8 3.8 3.9  CL 109   < > 104 100 101 108 106  CO2 23   < > 24 27 26 26 24   GLUCOSE 110*   < > 122* 132* 115* 116* 113*  BUN 16   < > 7* 6* 7* 9 12  CREATININE 0.86   < > 0.74 0.63 0.61 0.70 0.50  CALCIUM 8.8*   < > 8.3* 8.7* 8.4* 8.7* 8.4*  MG 2.1  --   --   1.5* 2.0 2.0 1.8  PHOS  --   --   --  2.7  --  4.2 3.6   < > = values in this interval not displayed.   GFR: Estimated Creatinine Clearance: 92.3 mL/min (by C-G formula based on SCr of 0.5 mg/dL). Liver Function Tests: Recent Labs  Lab 09/08/22 0331  AST 17  ALT 10  ALKPHOS 54  BILITOT 0.5  PROT 5.8*  ALBUMIN 2.2*   No results for input(s): "LIPASE", "AMYLASE" in the last 168 hours. No results for input(s): "AMMONIA" in the last 168 hours. Coagulation Profile: Recent Labs  Lab 09/06/22 0401  INR 1.3*   Cardiac Enzymes: No results for input(s): "CKTOTAL", "CKMB", "CKMBINDEX", "TROPONINI" in the last 168 hours. BNP (last 3 results) No results for input(s): "PROBNP" in the last 8760 hours. HbA1C: Recent Labs    09/07/22 1138  HGBA1C 6.1*   CBG: Recent Labs  Lab 09/08/22 1621 09/08/22 1951 09/09/22 0135 09/09/22 0407 09/09/22 0752  GLUCAP 112* 107* 128* 108* 108*   Lipid Profile: Recent Labs    09/08/22 0331  TRIG 55   Thyroid Function Tests: No results for input(s): "  TSH", "T4TOTAL", "FREET4", "T3FREE", "THYROIDAB" in the last 72 hours. Anemia Panel: No results for input(s): "VITAMINB12", "FOLATE", "FERRITIN", "TIBC", "IRON", "RETICCTPCT" in the last 72 hours. Sepsis Labs: No results for input(s): "PROCALCITON", "LATICACIDVEN" in the last 168 hours.  Recent Results (from the past 240 hour(s))  Aerobic/Anaerobic Culture w Gram Stain (surgical/deep wound)     Status: None (Preliminary result)   Collection Time: 09/06/22 12:53 PM   Specimen: Abdomen; Abscess  Result Value Ref Range Status   Specimen Description   Final    ABDOMEN Performed at [facility] [address] [place], [place] [address]    Special Requests   Final    NONE Performed at [facility], [place] [address] [place], [place] [address]    Gram Stain   Final    MODERATE WBC PRESENT,BOTH PMN AND MONONUCLEAR NO ORGANISMS SEEN Performed at [facility], [place] [address] [place], [place] [address]    Culture   Final    FEW ENTEROCOCCUS FAECIUM NO ANAEROBES ISOLATED; CULTURE IN PROGRESS FOR 5 DAYS    Report Status PENDING  Incomplete         Radiology Studies: [place] EKG SITE RITE  Result Date: 09/07/2022 If Site Rite image not attached, placement could not be confirmed due to current cardiac rhythm.       Scheduled Meds:  acetaminophen  1,000 mg Oral Q6H   amLODipine  10 mg Oral Q supper   Chlorhexidine Gluconate Cloth  6 each Topical Daily   cloNIDine  0.1 mg Oral Q supper   gabapentin  300 mg Oral TID   insulin aspart  0-15 Units Subcutaneous Q4H   lip balm   Topical BID   pantoprazole (PROTONIX) IV  40 mg Intravenous QHS   sodium chloride flush  10-40 mL Intracatheter Q12H   sodium chloride flush  3 mL Intravenous Q12H   sodium chloride flush  5 mL Intracatheter Q8H   traZODone  50 mg Oral QHS   Continuous Infusions:  sodium chloride     dextrose 5 % and 0.45 % NaCl with KCl 20 mEq/L 30 mL/hr at 09/08/22 1726   magnesium sulfate bolus IVPB     methocarbamol (ROBAXIN) IV 1,000 mg (09/09/22 0917)   ondansetron (ZOFRAN) IV     piperacillin-tazobactam (ZOSYN)  IV 3.375 g (09/09/22 0300)   TPN ADULT (ION) 70 mL/hr at 09/08/22 1718   TPN ADULT (ION)       LOS: 18 days    Time spent: 46 minutes spent on chart review, discussion with nursing staff, consultants, updating family and interview/physical exam; more than 50% of that time was spent in counseling and/or coordination of care.    Lonya Johannesen J [place] DO [facility] Available via Epic secure chat 7am-7pm After these hours, please refer to coverage provider listed on [email or web address] 09/09/2022, 9:54 AM

## 2022-09-09 NOTE — Progress Notes (Signed)
9 Days Post-Op   Subjective/Chief Complaint: Doing well Ostomy working  Wants more to eat    Objective: Vital signs in last 24 hours: Temp:  [98 F (36.7 C)-98.4 F (36.9 C)] 98 F (36.7 C) (09/17 0414) Pulse Rate:  [72-81] 76 (09/17 0414) Resp:  [18-19] 19 (09/17 0414) BP: (117-142)/(75-84) 125/75 (09/17 0414) SpO2:  [98 %-100 %] 98 % (09/17 0414) Last BM Date : 09/07/22  Intake/Output from previous day: 09/16 0701 - 09/17 0700 In: 3201.7 [P.O.:540; I.V.:2310.9; IV Piggyback:345.8] Out: 2885 [Urine:2725; Drains:10; Stool:150] Intake/Output this shift: No intake/output data recorded.   Gen:  Alert, NAD, pleasant Abd: Soft, mild distension, some llq and tenderness around her incisions. Hypoactive BS. Wound and stoma evaluated with WOC RN today - midline wound is pink and viable with areas of granulation; LUQ stoma with some necrosis, WOC RN digitized and it is patent, there is mucocutaneous separation from the 12-6 o'clock position with underlying fascia in tact.              R transgluteal drain - cloudy SS minimal  Lab Results:  Recent Labs    09/07/22 0416  WBC 9.7  HGB 10.3*  HCT 31.1*  PLT 338   BMET Recent Labs    09/08/22 0331 09/09/22 0553  NA 139 135  K 3.8 3.9  CL 108 106  CO2 26 24  GLUCOSE 116* 113*  BUN 9 12  CREATININE 0.70 0.50  CALCIUM 8.7* 8.4*   PT/INR No results for input(s): "LABPROT", "INR" in the last 72 hours. ABG No results for input(s): "PHART", "HCO3" in the last 72 hours.  Invalid input(s): "PCO2", "PO2"  Studies/Results: No results found.  Anti-infectives: Anti-infectives (From admission, onward)    Start     Dose/Rate Route Frequency Ordered Stop   09/06/22 1400  piperacillin-tazobactam (ZOSYN) IVPB 3.375 g        3.375 g 12.5 mL/hr over 240 Minutes Intravenous Every 8 hours 09/06/22 1302     09/02/22 1400  ceFEPIme (MAXIPIME) 2 g in sodium chloride 0.9 % 100 mL IVPB        2 g 200 mL/hr over 30 Minutes Intravenous  Every 8 hours 09/02/22 1015 09/05/22 0103   08/31/22 0600  cefoTEtan (CEFOTAN) 2 g in sodium chloride 0.9 % 100 mL IVPB  Status:  Discontinued        2 g 200 mL/hr over 30 Minutes Intravenous On call to O.R. 08/30/22 1316 08/31/22 1505   08/26/22 1400  ceFEPIme (MAXIPIME) 2 g in sodium chloride 0.9 % 100 mL IVPB  Status:  Discontinued        2 g 200 mL/hr over 30 Minutes Intravenous Every 8 hours 08/26/22 0848 09/02/22 1015   08/23/22 0400  cefTRIAXone (ROCEPHIN) 2 g in sodium chloride 0.9 % 100 mL IVPB  Status:  Discontinued        2 g 200 mL/hr over 30 Minutes Intravenous Every 24 hours 08/22/22 0345 08/26/22 0848   08/22/22 1400  metroNIDAZOLE (FLAGYL) IVPB 500 mg        500 mg 100 mL/hr over 60 Minutes Intravenous Every 12 hours 08/22/22 0345 09/05/22 0449   08/22/22 0100  cefTRIAXone (ROCEPHIN) 2 g in sodium chloride 0.9 % 100 mL IVPB        2 g 200 mL/hr over 30 Minutes Intravenous  Once 08/22/22 0047 08/22/22 0226   08/22/22 0100  metroNIDAZOLE (FLAGYL) IVPB 500 mg        500 mg 100  mL/hr over 60 Minutes Intravenous  Once 08/22/22 0047 08/22/22 9528       Assessment/Plan:  POD 9  s/p Exploratory laparotomy, sigmoid colectomy, descending colostomy, VAC dressing placement for Diverticulitis with Colonic Obstruction by Dr. Harlow Asa on 08/31/22 - Path benign  - CT 9/13 w/ central pelvic fluid collection measuring 6.3cm. s/p IR drainage 9/14 - recheck CT  - Patient with ongoing nausea and no bowel function. CT without significant dilated small bowel. Monitor stoma closely.  - start p.o. intake since nausea vomiting better.  Continue PICC/TPN- advance diet  - Continue wound vac, M/W/F vac changes - WOCN following for new ostomy education - Mobilize, continue therapies. Currently recommended for [facility] PT/OT.  - TOC consult for [facility] PT/OT/RN - Daily labs   FEN -soft diet, IVF per TRH VTE - SCDs, Lovenox ID - Restart Zosyn Foley - In place for urinary retention. Consider TOV tomorrow    - Pr TRH -  HTN GERD Electrolyte abnormalities   LOS: 18 days    Turner Daniels MD  09/09/2022

## 2022-09-10 DIAGNOSIS — K5732 Diverticulitis of large intestine without perforation or abscess without bleeding: Secondary | ICD-10-CM | POA: Diagnosis not present

## 2022-09-10 LAB — COMPREHENSIVE METABOLIC PANEL
ALT: 10 U/L (ref 0–44)
AST: 15 U/L (ref 15–41)
Albumin: 2.4 g/dL — ABNORMAL LOW (ref 3.5–5.0)
Alkaline Phosphatase: 59 U/L (ref 38–126)
Anion gap: 6 (ref 5–15)
BUN: 14 mg/dL (ref 8–23)
CO2: 24 mmol/L (ref 22–32)
Calcium: 8.8 mg/dL — ABNORMAL LOW (ref 8.9–10.3)
Chloride: 108 mmol/L (ref 98–111)
Creatinine, Ser: 0.52 mg/dL (ref 0.44–1.00)
GFR, Estimated: >60 mL/min (ref >60)
Glucose, Bld: 99 mg/dL (ref 70–99)
Potassium: 4.1 mmol/L (ref 3.5–5.1)
Sodium: 138 mmol/L (ref 135–145)
Total Bilirubin: 0.5 mg/dL (ref 0.3–1.2)
Total Protein: 6.5 g/dL (ref 6.5–8.1)

## 2022-09-10 LAB — MAGNESIUM: Magnesium: 2 mg/dL (ref 1.7–2.4)

## 2022-09-10 LAB — GLUCOSE, CAPILLARY
Glucose-Capillary: 121 mg/dL — ABNORMAL HIGH (ref 70–99)
Glucose-Capillary: 124 mg/dL — ABNORMAL HIGH (ref 70–99)
Glucose-Capillary: 129 mg/dL — ABNORMAL HIGH (ref 70–99)
Glucose-Capillary: 129 mg/dL — ABNORMAL HIGH (ref 70–99)
Glucose-Capillary: 135 mg/dL — ABNORMAL HIGH (ref 70–99)
Glucose-Capillary: 141 mg/dL — ABNORMAL HIGH (ref 70–99)

## 2022-09-10 LAB — TRIGLYCERIDES: Triglycerides: 100 mg/dL (ref <150)

## 2022-09-10 LAB — PHOSPHORUS: Phosphorus: 3.2 mg/dL (ref 2.5–4.6)

## 2022-09-10 MED ORDER — TRAVASOL 10 % IV SOLN
INTRAVENOUS | Status: AC
  Filled 2022-09-10: qty 957.6

## 2022-09-10 NOTE — Progress Notes (Signed)
PHARMACY - TOTAL PARENTERAL NUTRITION CONSULT NOTE   Indication: Prolonged ileus  Patient Measurements: Height: 5\' 9"  (175.3 cm) Weight: 112 kg (247 lb) IBW/kg (Calculated) : 66.2 TPN AdjBW (KG): 77.7 Body mass index is 36.48 kg/m.  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  9/8: Exploratory laparotomy, sigmoid colectomy, descending colostomy, VAC dressing placement for Diverticulitis with Colonic Obstruction  -9/13: CT 9/13 w/ central pelvic fluid collection measuring 6.3cm -9/14: IR drainage of fluid collection -9/15: Ongoing nausea and some belching, no vomiting. No gas/stool in ostomy pouch. See RD note 9/14. Con't NPO and start TPN for prolonged ileus.  Glucose / Insulin: pre-DM with A1C 6 (01/2022).  - Glucose has been < 150, controlled  - 6 units of insulin used in last 24 hours  Electrolytes: WNL Renal: Scr <1 Hepatic: albumin 2.4, LFTs WNL  Intake / Output; MIVF:  - D5 1/2 NS 20 KCl at 30 ml/hr GI Imaging: - 9/13: CT 9/13 w/ central pelvic fluid collection measuring 6.3cm  GI Surgeries / Procedures:  - 9/8: Exploratory laparotomy, sigmoid colectomy, descending colostomy, VAC dressing placement for Diverticulitis with Colonic Obstruction  - 9/14: IR drainage of fluid collection/abscess  Central access: PICC TPN start date: 09/06/22  Nutritional Goals: Goal TPN rate is 70 mL/hr (provides 95 g of protein and 1774 kcals per day)  RD Assessment: Estimated Needs Total Energy Estimated Needs: 1700-1900 Total Protein Estimated Needs: 95-105g Total Fluid Estimated Needs: 1.9L/day  Current Nutrition:  TPN, soft diet start  9/17   Plan:  Continue TPN at goal rate 70 mL/hr Monitor for diet advancement and patient tolerating. Plan to wean TPN once patient tolerating and better meeting goal needs via enteral nutrition.  Electrolytes in TPN:  Na 50 mEq/L K 50 mEq/L Ca 5 mEq/L  Mg 8 mEq/L  Phos 10 mmol/L  Cl:Ac 1:1 (standard) Add standard MVI and trace elements to TPN Continue Moderate SSI q4h and adjust as needed  Continue MIVF at 30 mL/hr per primary MD Monitor TPN labs on Mon/Thurs, and PRN  Tawnya Crook, PharmD, BCPS Clinical Pharmacist 09/10/2022 10:16 AM

## 2022-09-10 NOTE — Progress Notes (Addendum)
Nutrition Follow-up  DOCUMENTATION CODES:   Obesity unspecified  INTERVENTION:   -TPN management per Pharmacy  -Will monitor to see if diet is tolerated  NUTRITION DIAGNOSIS:   Increased nutrient needs related to post-op healing (h/o gastric sleeve) as evidenced by estimated needs.  Ongoing.  GOAL:   Patient will meet greater than or equal to 90% of their needs  Meeting with TPN  MONITOR:   PO intake, Supplement acceptance, Labs, Weight trends, I & O's, TPN  ASSESSMENT:   66 year old female with history of gastric sleeve surgery for weight loss February 2093, recurrent diverticulitis, recently completed 2 rounds of antibiotics presented with worsening abdominal pain with fever and chills. She was admitted with sigmoid diverticulitis and started on iv antibiotics.  8/29: admitted, NPO 9/1: CLD 9/2: NPO 9/3: CLD 9/5: FLD 9/7: NPO 9/8: s/p ex lap, sig colectomy, colostomy, VAC 9/9: CLD -> FLD 9/13: NPO 9/14: TPN initiated 9/16: CLD 9/17: Soft diet  Patient currently on soft diet but not much intakes at this time d/t continued N/V.  TPN to continue at 70 ml/hr, providing 1774 kcals and 95g protein.  Admission weight: 248 lbs 9/8 weight: 247 lbs  Medications: D5 infusion, Zofran, Compazine  Labs reviewed: CBGs: 108-135   Diet Order:   Diet Order             DIET SOFT Room service appropriate? Yes; Fluid consistency: Thin  Diet effective now                   EDUCATION NEEDS:   No education needs have been identified at this time  Skin:  Skin Assessment: Skin Integrity Issues: Skin Integrity Issues:: Incisions Incisions: 9/8 abdomen  Last BM:  9/18 -type 7 -colostomy  Height:   Ht Readings from Last 1 Encounters:  08/31/22 5\' 9"  (1.753 m)    Weight:   Wt Readings from Last 1 Encounters:  08/31/22 112 kg    BMI:  Body mass index is 36.48 kg/m.  Estimated Nutritional Needs:   Kcal:  1700-1900  Protein:  95-105g  Fluid:   1.9L/day  Clayton Bibles, MS, RD, LDN Inpatient Clinical Dietitian Contact information available via Amion

## 2022-09-10 NOTE — Progress Notes (Signed)
Referring Physician(s): White,C  Supervising Physician: Jacqulynn Cadet  Patient Status:  [facility] - In-pt  Chief Complaint: Abdominal pain, nausea, vomiting ,postop pelvic abscess   Subjective: Patient complaining of intermittent nausea and vomiting, still continues to have some left upper abdominal discomfort as well as tenderness at transgluteal drain site   Allergies: Benazepril, Sulfa antibiotics, and Elemental sulfur  Medications: Prior to Admission medications   Medication Sig Start Date End Date Taking? Authorizing Provider  acetaminophen (TYLENOL) 500 MG tablet Take 1,000 mg by mouth daily as needed for mild pain. pain    Yes [provider]  amLODipine (NORVASC) 10 MG tablet Take 10 mg by mouth daily with supper. 08/08/17  Yes [provider]  amoxicillin (AMOXIL) 500 MG capsule Take 2,000 mg by mouth once.   Yes [provider]  amoxicillin-clavulanate (AUGMENTIN) 875-125 MG tablet Take 1 tablet by mouth 2 (two) times daily. 08/17/22  Yes [provider]  cloNIDine (CATAPRES) 0.1 MG tablet Take 0.1 mg by mouth daily with supper. 10/13/18  Yes [provider]  dicyclomine (BENTYL) 20 MG tablet Take 20 mg by mouth every 8 (eight) hours. 12/05/16  Yes [provider]  methocarbamol (ROBAXIN) 500 MG tablet Take 500 mg by mouth every 8 (eight) hours as needed for muscle spasms.   Yes [provider]  Multiple Vitamin (MULTIVITAMIN) tablet Take 1 tablet by mouth daily.   Yes [provider]  ondansetron (ZOFRAN-ODT) 4 MG disintegrating tablet Take 1 tablet (4 mg total) by mouth every 6 (six) hours as needed for nausea or vomiting. 02/20/22  Yes Clovis Riley, MD  pantoprazole (PROTONIX) 40 MG tablet Take 1 tablet (40 mg total) by mouth daily. 02/20/22  Yes Clovis Riley, MD  polyethylene glycol (MIRALAX / GLYCOLAX) 17 g packet Take 17 g by mouth daily as needed (constipation).   Yes [provider]  Probiotic CAPS Take 1 capsule by mouth daily. 06/28/20  Yes Ward, Delice Bison, DO  tizanidine (ZANAFLEX) 2 MG capsule Take 2 mg by mouth every 8 (eight) hours as needed for muscle spasms.   Yes [provider]  traMADol (ULTRAM) 50 MG tablet Take 1 tablet (50 mg total) by mouth every 6 (six) hours as needed (pain). 02/20/22  Yes Clovis Riley, MD  gabapentin (NEURONTIN) 100 MG capsule Take 2 capsules (200 mg total) by mouth every 12 (twelve) hours. Patient not taking: Reported on 08/22/2022 02/20/22   Clovis Riley, MD     Vital Signs: BP (!) 157/88 (BP Location: Left Arm)   Pulse 88   Temp 99 F (37.2 C) (Oral)   Resp 19   Ht 5\' 9"  (1.753 m)   Wt 247 lb (112 kg)   SpO2 97%   BMI 36.48 kg/m   Physical Exam awake, alert. rt TG drain intact, dressing clean and dry, site mildly tender, OP  10 cc serosang fluid; drain flushed but minimal return  Imaging: [place] EKG SITE RITE  Result Date: 09/07/2022 If Site Rite image not attached, placement could not be confirmed due to current cardiac rhythm.   Labs:  CBC: Recent Labs    09/04/22 0343 09/05/22 0351 09/06/22 0401 09/07/22 0416  WBC 13.3* 10.2 9.7 9.7  HGB 9.6* 10.6* 11.1* 10.3*  HCT 30.2* 33.5* 33.5* 31.1*  PLT 298 311 367 338    COAGS: Recent Labs    09/06/22 0401  INR 1.3*    BMP: Recent Labs    09/07/22  9767 09/08/22 0331 09/09/22 0553 09/10/22 0511  NA 134* 139 135 138  K 3.8 3.8 3.9 4.1  CL 101 108 106 108  CO2 26 26 24 24   GLUCOSE 115* 116* 113* 99  BUN 7* 9 12 14   CALCIUM 8.4* 8.7* 8.4* 8.8*  CREATININE 0.61 0.70 0.50 0.52  GFRNONAA >60 >60 >60 >60    LIVER FUNCTION TESTS: Recent Labs    08/23/22 0418 08/24/22 0419 09/08/22 0331 09/10/22 0511  BILITOT 0.5 0.5 0.5 0.5  AST 16 14* 17 15  ALT 14 13 10 10   ALKPHOS 50 48 54 59  PROT 7.0 7.0 5.8* 6.5  ALBUMIN 2.8* 2.8* 2.2* 2.4*    Assessment and Plan: Pt with hx sigmoid diverticulitis with prior exploratory  laparotomy, sigmoid colectomy, descending colostomy with Dr. 09/10/22 on 08/31/2022; now with postop pelvic abscess, s/p rt TG drain placement 9/14 (12 fr to JP); temp 99, creat nl, cx- enterococcus; with minimal drain OP , persistent pain,N/V rec f/u CT   Electronically Signed: D. Gerrit Friends, PA-C 09/10/2022, 4:23 PM   I spent a total of 15 Minutes at the the patient's bedside AND on the patient's hospital floor or unit, greater than 50% of which was counseling/coordinating care for pelvic abscess drain    Patient ID: [id number], female   DOB: Dec 09, 1956, 66 y.o.   MRN: Kristin Gibson

## 2022-09-10 NOTE — Progress Notes (Signed)
  Transition of Care [facility]) Screening Note   Patient Details  Name: Kristin Gibson Date of Birth: June 10, 1956   Transition of Care [facility]) CM/SW Contact:    Roseanne Kaufman, RN Phone Number: 09/10/2022, 2:48 PM    Transition of Care Department [facility]) has reviewed patient and no TOC needs have been identified at this time. We will continue to monitor patient advancement through interdisciplinary progression rounds. If new patient transition needs arise, please place a TOC consult.

## 2022-09-10 NOTE — Progress Notes (Signed)
PROGRESS NOTE    Kristin Gibson  [id number] DOB: Apr 01, 1956 DOA: 08/21/2022 PCP: Marjory Sneddon, MD    Brief Narrative:   Kristin Gibson is a 66 y.o. female with past medical history significant for morbid obesity s/p gastric sleeve surgery February 2023, recurrent diverticulitis in which she recently completed 2 rounds of antibiotics who presented to [facility] ED on 8/29 with progressive/recurrent abdominal pain, fever/chills.  Work-up in the ED notable for recurrent sigmoid diverticulitis.  Patient was started on IV antibiotics.  General surgery was consulted.  Initially admitted to the hospital service.  Patient underwent exploratory laparotomy, sigmoid colectomy, descending colostomy with wound VAC placement by Dr. Harlow Asa on 08/31/2022.  Care was transferred to the general surgery service on 09/03/2022.  Assessment & Plan:   Recurrent sigmoid diverticulitis Postoperative pelvic abscess w/ enterococcus faecium Patient presenting to ED with progressive abdominal pain associate with fever/chills.  Work-up revealing recurrent sigmoid diverticulitis.  Patient recently completed 2 courses of outpatient antibiotics which she has failed.  General surgery was consulted and patient underwent exploratory laparotomy with sigmoid colectomy, descending colostomy with wound VAC placement by Dr. Harlow Asa on 08/31/2022.  Repeat CT abdomen/pelvis with contrast 9/13 notable for central pelvic fluid collection measuring 6.3 cm with thin peripheral enhancement consistent with early abscess and additional smaller peritoneal fluid collections, no free air or antibiotics static breakdown, no bowel obstruction or ileus. --Started on TPN 9/15, started soft diet 9/17 per CCS --Vancomycin --s/p IR pelvic drain placement 9/14 --Culture from drain placement 9/14 with few Enterococcus faecium --D5 half NS with 20 mEq KCL at 142mL/h --Follow-up cultures from drain; no growth less than 24 hours --Wound care, pain  control, diet per general surgery  Essential hypertension -- Amlodipine 10 mg p.o. daily -- Clonidine 0.1 mg p.o. qPM -- Hydralazine 5 mg IV q4h PRN SBP >170 or DBP >95  GERD --Protonix 40 mg IV daily  Hypokalemia Hypomagnesemia Potassium 4.1 this morning.  Continue monitor electrolytes intermittently  Morbid obesity Body mass index is 36.48 kg/m.  Discussed with patient needs for aggressive lifestyle changes/weight loss as this complicates all facets of care.  Outpatient follow-up with PCP.   DVT prophylaxis: SCD's Start: 08/30/22 1310    Code Status: Full Code Family Communication: No family present at bedside this morning  Disposition Plan:  Level of care: Med-Surg Status is: Inpatient Remains inpatient appropriate because: Per primary, general surgery     Procedures:  Exploratory laparotomy with sigmoid colectomy, descending colostomy with wound VAC placement, Dr. Harlow Asa on 08/31/2022.   Antimicrobials:  Ceftriaxone 8/29 - 8/2 Metronidazole 8/29 - 9/12 Cefepime 9/3 - 9/12 Zosyn 9/14>>   Subjective: Patient seen examined bedside, resting comfortably.  Lying in bed.  Wound care nurse present currently replacing wound VAC and performing ostomy care.  Patient advance to soft diet yesterday but reports episode of emesis overnight, but did tolerate her breakfast this morning.  Otherwise no other specific complaints, questions or concerns at this time.  No family present.  Blood pressure remains controlled and electrolytes within normal limits.  Continues on TPN.  No other specific questions or concerns at this time. Denies headache, no dizziness, no chest pain, no palpitations, no shortness of breath, no fever/chills/night sweats, no nausea/vomiting, no focal weakness, no fatigue, no cough/congestion, no paresthesias.  No acute events overnight per nursing staff.  Objective: Vitals:   09/08/22 2022 09/09/22 0414 09/09/22 1958 09/10/22 0432  BP:  125/75 116/73 130/79   Pulse: 72 76 83 76  Resp: 18 19 18 20   Temp:  98 F (36.7 C) 99.4 F (37.4 C) 98.8 F (37.1 C)  TempSrc:  Oral Oral Oral  SpO2: 98% 98% 95% 100%  Weight:      Height:        Intake/Output Summary (Last 24 hours) at 09/10/2022 1047 Last data filed at 09/10/2022 0936 Gross per 24 hour  Intake 2306.53 ml  Output 3185 ml  Net -878.47 ml   Filed Weights   08/21/22 2220 08/31/22 0815  Weight: 112.5 kg 112 kg    Examination:  Physical Exam: GEN: NAD, alert and oriented x 3, obese HEENT: NCAT, PERRL, EOMI, sclera clear, MMM PULM: CTAB w/o wheezes/crackles, normal respiratory effort, on room air CV: RRR w/o M/G/R GI: abd soft, NTND, wound VAC and ostomy noted, JP drain noted with no fluid in collection bulb.  MSK: no peripheral edema, moves all extremities independently NEURO: CN II-XII intact, no focal deficits, sensation to light touch intact PSYCH: normal mood/affect Integumentary: Surgical incision site noted with wound VAC in place, ostomy noted in place, JP drain noted, otherwise no other concerning rashes/lesions/wounds on exposed areas     Data Reviewed: I have personally reviewed following labs and imaging studies  CBC: Recent Labs  Lab 09/04/22 0343 09/05/22 0351 09/06/22 0401 09/07/22 0416  WBC 13.3* 10.2 9.7 9.7  NEUTROABS 11.0* 8.0* 7.3  --   HGB 9.6* 10.6* 11.1* 10.3*  HCT 30.2* 33.5* 33.5* 31.1*  MCV 89.9 89.8 84.8 85.7  PLT 298 311 367 993   Basic Metabolic Panel: Recent Labs  Lab 09/06/22 0401 09/07/22 0416 09/08/22 0331 09/09/22 0553 09/10/22 0511  NA 134* 134* 139 135 138  K 3.1* 3.8 3.8 3.9 4.1  CL 100 101 108 106 108  CO2 27 26 26 24 24   GLUCOSE 132* 115* 116* 113* 99  BUN 6* 7* 9 12 14   CREATININE 0.63 0.61 0.70 0.50 0.52  CALCIUM 8.7* 8.4* 8.7* 8.4* 8.8*  MG 1.5* 2.0 2.0 1.8 2.0  PHOS 2.7  --  4.2 3.6 3.2   GFR: Estimated Creatinine Clearance: 92.3 mL/min (by C-G formula based on SCr of 0.52 mg/dL). Liver Function  Tests: Recent Labs  Lab 09/08/22 0331 09/10/22 0511  AST 17 15  ALT 10 10  ALKPHOS 54 59  BILITOT 0.5 0.5  PROT 5.8* 6.5  ALBUMIN 2.2* 2.4*   No results for input(s): "LIPASE", "AMYLASE" in the last 168 hours. No results for input(s): "AMMONIA" in the last 168 hours. Coagulation Profile: Recent Labs  Lab 09/06/22 0401  INR 1.3*   Cardiac Enzymes: No results for input(s): "CKTOTAL", "CKMB", "CKMBINDEX", "TROPONINI" in the last 168 hours. BNP (last 3 results) No results for input(s): "PROBNP" in the last 8760 hours. HbA1C: Recent Labs    09/07/22 1138  HGBA1C 6.1*   CBG: Recent Labs  Lab 09/09/22 1708 09/09/22 1955 09/10/22 0006 09/10/22 0429 09/10/22 0754  GLUCAP 112* 128* 129* 129* 124*   Lipid Profile: Recent Labs    09/08/22 0331 09/10/22 0511  TRIG 55 100   Thyroid Function Tests: No results for input(s): "TSH", "T4TOTAL", "FREET4", "T3FREE", "THYROIDAB" in the last 72 hours. Anemia Panel: No results for input(s): "VITAMINB12", "FOLATE", "FERRITIN", "TIBC", "IRON", "RETICCTPCT" in the last 72 hours. Sepsis Labs: No results for input(s): "PROCALCITON", "LATICACIDVEN" in the last 168 hours.  Recent Results (from the past 240 hour(s))  Aerobic/Anaerobic Culture w Gram Stain (surgical/deep wound)     Status: None (Preliminary result)   Collection  Time: 09/06/22 12:53 PM   Specimen: Abdomen; Abscess  Result Value Ref Range Status   Specimen Description ABDOMEN  Final   Special Requests NONE  Final   Gram Stain   Final    MODERATE WBC PRESENT,BOTH PMN AND MONONUCLEAR NO ORGANISMS SEEN    Culture   Final    FEW ENTEROCOCCUS FAECIUM NO ANAEROBES ISOLATED; CULTURE IN PROGRESS FOR 5 DAYS    Report Status PENDING  Incomplete   Organism ID, Bacteria ENTEROCOCCUS FAECIUM  Final      Susceptibility   Enterococcus faecium - MIC*    AMPICILLIN >=32 RESISTANT Resistant     VANCOMYCIN <=0.5 SENSITIVE Sensitive     GENTAMICIN SYNERGY Value in next row  Resistant      RESISTANTPerformed at [facility] [address], [place], [place] [address]    * FEW ENTEROCOCCUS FAECIUM         Radiology Studies: No results found.      Scheduled Meds:  acetaminophen  1,000 mg Oral Q6H   amLODipine  10 mg Oral Q supper   Chlorhexidine Gluconate Cloth  6 each Topical Daily   cloNIDine  0.1 mg Oral Q supper   gabapentin  300 mg Oral TID   insulin aspart  0-15 Units Subcutaneous Q4H   lip balm   Topical BID   pantoprazole (PROTONIX) IV  40 mg Intravenous QHS   sodium chloride flush  10-40 mL Intracatheter Q12H   sodium chloride flush  3 mL Intravenous Q12H   sodium chloride flush  5 mL Intracatheter Q8H   traZODone  50 mg Oral QHS   Continuous Infusions:  sodium chloride     dextrose 5 % and 0.45 % NaCl with KCl 20 mEq/L 30 mL/hr at 09/10/22 0621   methocarbamol (ROBAXIN) IV 1,000 mg (09/10/22 0936)   ondansetron (ZOFRAN) IV     piperacillin-tazobactam (ZOSYN)  IV 3.375 g (09/10/22 0623)   TPN ADULT (ION) 70 mL/hr at 09/09/22 1734   TPN ADULT (ION)     vancomycin 1,000 mg (09/10/22 0622)     LOS: 19 days    Time spent: 46 minutes spent on chart review, discussion with nursing staff, consultants, updating family and interview/physical exam; more than 50% of that time was spent in counseling and/or coordination of care.    Bunyan Brier J [place] DO [facility] Available via Epic secure chat 7am-7pm After these hours, please refer to coverage provider listed on [email or web address] 09/10/2022, 10:47 AM

## 2022-09-10 NOTE — Progress Notes (Addendum)
PT Cancellation Note  Patient Details Name: Kristin Gibson MRN: [id number] DOB: April 15, 1956   Cancelled Treatment:    Reason Eval/Treat Not Completed:  Pt declined to participate-she stated she doesn't feel comfortable mobilizing since she had some issues with her wound on today. Encouraged pt to discuss this with MD/PA in the a.m.  Will check back another day.    [facility]  Office: [phone] Pager: [phone]

## 2022-09-10 NOTE — Consult Note (Addendum)
[place] Nurse wound consult  follow-up Note: Reason for Consult: Surgical PA at the bedside to assess the wound appearance.  Vac removed and upper wound edge at 12:00 had mod amt bleeding.  Sutures and surgiceal placed to the location and Vac was discontinued until reassessment tomorrow. Refer to surgical team notes for further information. Pt was medicated for pain prior to the procedure and tolerated with minimal amt discomfort.  Wound is 100% beefy red. Moist gauze packing and ABD pad applied, this should remain in place until surgical team reassesses the location tomorrow.  [place] Nurse ostomy follow up Stoma type/location: Stoma is not visible. Location is approx 1 3/8 inches and oval, below skin level, yellow.  Surgical PA assessed patency digitally; refer to their noted for further information.  Applied barrier ring and one piece convex pouch. Pt watched the procedure using a hand held mirror and was able to apply barrier ring and pouch with assistance, and open and close the velcro to empty without assistance.  Peristomal assessment: intact skin surrounding Output: 50cc brown liquid Ostomy pouching: Use supplies:  barrier ring Kellie Simmering # [phone] and convex pouch Kellie Simmering # [phone] Education provided: Discussed pouching routines and ordering supplies Enrolled patient in [place] Start Discharge program: Yes, today 4 extra sets of barrier rings and pouches left in the room for staff nurses' use. Thank-you,  Julien Girt MSN, [place], [place], [place], [place]

## 2022-09-10 NOTE — Progress Notes (Signed)
10 Days Post-Op   Subjective/Chief Complaint: Seen during VAC change. [place] she feels she is doing better but she is still having some nausea and had emesis after dinner, also had a moderate volume of emesis during my exam today. Non-bloody. Reports gas via colostomy and a very small amt stool.    Objective: Vital signs in last 24 hours: Temp:  [98.8 F (37.1 C)-99.4 F (37.4 C)] 98.8 F (37.1 C) (09/18 0432) Pulse Rate:  [76-83] 76 (09/18 0432) Resp:  [18-20] 20 (09/18 0432) BP: (116-130)/(73-79) 130/79 (09/18 0432) SpO2:  [95 %-100 %] 100 % (09/18 0432) Last BM Date : 09/10/22  Intake/Output from previous day: 09/17 0701 - 09/18 0700 In: 2822.8 [P.O.:480; I.V.:1791.1; IV Piggyback:551.7] Out: 3135 [Urine:2875; Emesis/NG output:200; Drains:10; Stool:50] Intake/Output this shift: Total I/O In: -  Out: 50 [Stool:50]   Gen:  Alert, NAD, pleasant Abd: Soft, mild distension, appropriately tender around her incisions. Hypoactive BS.  Wound: granulating, fascia in tact, there was pulsatile bleeding from the right proximal wound apex - this was controled with a 2, figure-of-8 silk sutures. Small piece of Surgicel was placed on top.  Moist-to-dry dressing placed.   Stoma:  some necrosis, I was able to digitize this and it is patent, it is tight at the level of the rectus but it is patent. Mucocutaneous separation from the 12-6 o'clock position without cellulitis or visible/palpable abscess.              R transgluteal drain -  minimal clear drainage    BMET Recent Labs    09/09/22 0553 09/10/22 0511  NA 135 138  K 3.9 4.1  CL 106 108  CO2 24 24  GLUCOSE 113* 99  BUN 12 14  CREATININE 0.50 0.52  CALCIUM 8.4* 8.8*   PT/INR No results for input(s): "LABPROT", "INR" in the last 72 hours. ABG No results for input(s): "PHART", "HCO3" in the last 72 hours.  Invalid input(s): "PCO2", "PO2"  Studies/Results: No results found.  Anti-infectives: Anti-infectives (From  admission, onward)    Start     Dose/Rate Route Frequency Ordered Stop   09/10/22 0600  vancomycin (VANCOCIN) IVPB 1000 mg/200 mL premix        1,000 mg 200 mL/hr over 60 Minutes Intravenous Every 12 hours 09/09/22 1620     09/09/22 1715  vancomycin (VANCOREADY) IVPB 2000 mg/400 mL        2,000 mg 200 mL/hr over 120 Minutes Intravenous  Once 09/09/22 1620 09/09/22 1955   09/06/22 1400  piperacillin-tazobactam (ZOSYN) IVPB 3.375 g        3.375 g 12.5 mL/hr over 240 Minutes Intravenous Every 8 hours 09/06/22 1302     09/02/22 1400  ceFEPIme (MAXIPIME) 2 g in sodium chloride 0.9 % 100 mL IVPB        2 g 200 mL/hr over 30 Minutes Intravenous Every 8 hours 09/02/22 1015 09/05/22 0103   08/31/22 0600  cefoTEtan (CEFOTAN) 2 g in sodium chloride 0.9 % 100 mL IVPB  Status:  Discontinued        2 g 200 mL/hr over 30 Minutes Intravenous On call to O.R. 08/30/22 1316 08/31/22 1505   08/26/22 1400  ceFEPIme (MAXIPIME) 2 g in sodium chloride 0.9 % 100 mL IVPB  Status:  Discontinued        2 g 200 mL/hr over 30 Minutes Intravenous Every 8 hours 08/26/22 0848 09/02/22 1015   08/23/22 0400  cefTRIAXone (ROCEPHIN) 2 g in sodium chloride 0.9 %  100 mL IVPB  Status:  Discontinued        2 g 200 mL/hr over 30 Minutes Intravenous Every 24 hours 08/22/22 0345 08/26/22 0848   08/22/22 1400  metroNIDAZOLE (FLAGYL) IVPB 500 mg        500 mg 100 mL/hr over 60 Minutes Intravenous Every 12 hours 08/22/22 0345 09/05/22 0449   08/22/22 0100  cefTRIAXone (ROCEPHIN) 2 g in sodium chloride 0.9 % 100 mL IVPB        2 g 200 mL/hr over 30 Minutes Intravenous  Once 08/22/22 0047 08/22/22 0226   08/22/22 0100  metroNIDAZOLE (FLAGYL) IVPB 500 mg        500 mg 100 mL/hr over 60 Minutes Intravenous  Once 08/22/22 0047 08/22/22 0412       Assessment/Plan:  POD 10  s/p Exploratory laparotomy, sigmoid colectomy, descending colostomy, VAC dressing placement for Diverticulitis with Colonic Obstruction by Dr. Harlow Asa on  08/31/22 - Path benign  - CT 9/13 w/ central pelvic fluid collection measuring 6.3cm. s/p IR drainage 9/14, Cx- ENTEROCOCCUS FAECIUM  - Patient with ongoing nausea and minimal bowel function. CT without significant dilated small bowel. given ongoing emesis and lack of bowel function, I suspect she will need a repeat CT scan in the next 24-48 hours. Cannot completely exclude a partial LBO at the level of her stoma.  - allow soft diet but pt encouraged to stick with sips of liquids/ice chips if she is nauseated. Don't force PO. Continue PICC/TPN. - will replace wound VAC tomorrow if no signs of bleeding. - WOCN following for new ostomy education - Mobilize, continue therapies. Currently recommended for [facility] PT/OT.  - TOC consult for [facility]/OT/RN - Daily labs   FEN -soft diet, TPN VTE - SCDs, Lovenox ID - Restart Zosyn Foley - In place for urinary retention. Consider TOV tomorrow   - Pr TRH -  HTN GERD Electrolyte abnormalities   LOS: 19 days    Jill Alexanders MD  09/10/2022

## 2022-09-11 ENCOUNTER — Inpatient Hospital Stay (HOSPITAL_COMMUNITY): Payer: Medicare HMO

## 2022-09-11 DIAGNOSIS — K5732 Diverticulitis of large intestine without perforation or abscess without bleeding: Secondary | ICD-10-CM | POA: Diagnosis not present

## 2022-09-11 LAB — CBC WITH DIFFERENTIAL/PLATELET
Abs Immature Granulocytes: 0.08 10*3/uL — ABNORMAL HIGH (ref 0.00–0.07)
Basophils Absolute: 0 10*3/uL (ref 0.0–0.1)
Basophils Relative: 0 %
Eosinophils Absolute: 0.2 10*3/uL (ref 0.0–0.5)
Eosinophils Relative: 3 %
HCT: 28.3 % — ABNORMAL LOW (ref 36.0–46.0)
Hemoglobin: 8.8 g/dL — ABNORMAL LOW (ref 12.0–15.0)
Immature Granulocytes: 1 %
Lymphocytes Relative: 11 %
Lymphs Abs: 0.9 10*3/uL (ref 0.7–4.0)
MCH: 27.6 pg (ref 26.0–34.0)
MCHC: 31.1 g/dL (ref 30.0–36.0)
MCV: 88.7 fL (ref 80.0–100.0)
Monocytes Absolute: 0.7 10*3/uL (ref 0.1–1.0)
Monocytes Relative: 9 %
Neutro Abs: 6.3 10*3/uL (ref 1.7–7.7)
Neutrophils Relative %: 76 %
Platelets: 335 10*3/uL (ref 150–400)
RBC: 3.19 MIL/uL — ABNORMAL LOW (ref 3.87–5.11)
RDW: 15.5 % (ref 11.5–15.5)
WBC: 8.3 10*3/uL (ref 4.0–10.5)
nRBC: 0 % (ref 0.0–0.2)

## 2022-09-11 LAB — BASIC METABOLIC PANEL
Anion gap: 6 (ref 5–15)
BUN: 14 mg/dL (ref 8–23)
CO2: 24 mmol/L (ref 22–32)
Calcium: 8.8 mg/dL — ABNORMAL LOW (ref 8.9–10.3)
Chloride: 107 mmol/L (ref 98–111)
Creatinine, Ser: 0.68 mg/dL (ref 0.44–1.00)
GFR, Estimated: >60 mL/min (ref >60)
Glucose, Bld: 118 mg/dL — ABNORMAL HIGH (ref 70–99)
Potassium: 4.2 mmol/L (ref 3.5–5.1)
Sodium: 137 mmol/L (ref 135–145)

## 2022-09-11 LAB — AEROBIC/ANAEROBIC CULTURE W GRAM STAIN (SURGICAL/DEEP WOUND)

## 2022-09-11 LAB — GLUCOSE, CAPILLARY
Glucose-Capillary: 111 mg/dL — ABNORMAL HIGH (ref 70–99)
Glucose-Capillary: 116 mg/dL — ABNORMAL HIGH (ref 70–99)
Glucose-Capillary: 125 mg/dL — ABNORMAL HIGH (ref 70–99)
Glucose-Capillary: 136 mg/dL — ABNORMAL HIGH (ref 70–99)
Glucose-Capillary: 136 mg/dL — ABNORMAL HIGH (ref 70–99)
Glucose-Capillary: 147 mg/dL — ABNORMAL HIGH (ref 70–99)

## 2022-09-11 MED ORDER — SODIUM CHLORIDE (PF) 0.9 % IJ SOLN
INTRAMUSCULAR | Status: AC
  Filled 2022-09-11: qty 50

## 2022-09-11 MED ORDER — IOHEXOL 300 MG/ML  SOLN
100.0000 mL | Freq: Once | INTRAMUSCULAR | Status: AC | PRN
  Administered 2022-09-11: 100 mL via INTRAVENOUS

## 2022-09-11 MED ORDER — TRAVASOL 10 % IV SOLN
INTRAVENOUS | Status: AC
  Filled 2022-09-11: qty 957.6

## 2022-09-11 NOTE — Plan of Care (Signed)
  Problem: Education: Goal: Knowledge of General Education information will improve Description: Including pain rating scale, medication(s)/side effects and non-pharmacologic comfort measures Outcome: Progressing   Problem: Health Behavior/Discharge Planning: Goal: Ability to manage health-related needs will improve Outcome: Progressing   Problem: Clinical Measurements: Goal: Ability to maintain clinical measurements within normal limits will improve Outcome: Progressing Goal: Will remain free from infection Outcome: Progressing Goal: Diagnostic test results will improve Outcome: Progressing Goal: Respiratory complications will improve Outcome: Progressing   Problem: Activity: Goal: Risk for activity intolerance will decrease Outcome: Progressing   Problem: Nutrition: Goal: Adequate nutrition will be maintained Outcome: Progressing   Problem: Coping: Goal: Level of anxiety will decrease Outcome: Progressing   Problem: Elimination: Goal: Will not experience complications related to bowel motility Outcome: Progressing Goal: Will not experience complications related to urinary retention Outcome: Progressing   Problem: Pain Managment: Goal: General experience of comfort will improve Outcome: Progressing   Problem: Safety: Goal: Ability to remain free from injury will improve Outcome: Progressing   Problem: Skin Integrity: Goal: Risk for impaired skin integrity will decrease Outcome: Progressing   Problem: Education: Goal: Understanding of discharge needs will improve Outcome: Progressing Goal: Verbalization of understanding of the causes of altered bowel function will improve Outcome: Progressing   Problem: Activity: Goal: Ability to tolerate increased activity will improve Outcome: Progressing   Problem: Bowel/Gastric: Goal: Gastrointestinal status for postoperative course will improve Outcome: Progressing   Problem: Health Behavior/Discharge  Planning: Goal: Identification of community resources to assist with postoperative recovery needs will improve Outcome: Progressing   Problem: Nutritional: Goal: Will attain and maintain optimal nutritional status will improve Outcome: Progressing   Problem: Clinical Measurements: Goal: Postoperative complications will be avoided or minimized Outcome: Progressing   Problem: Respiratory: Goal: Respiratory status will improve Outcome: Progressing   Problem: Skin Integrity: Goal: Will show signs of wound healing Outcome: Progressing   Problem: Education: Goal: Ability to describe self-care measures that may prevent or decrease complications (Diabetes Survival Skills Education) will improve Outcome: Progressing Goal: Individualized Educational Video(s) Outcome: Progressing   Problem: Coping: Goal: Ability to adjust to condition or change in health will improve Outcome: Progressing   Problem: Fluid Volume: Goal: Ability to maintain a balanced intake and output will improve Outcome: Progressing   Problem: Health Behavior/Discharge Planning: Goal: Ability to identify and utilize available resources and services will improve Outcome: Progressing Goal: Ability to manage health-related needs will improve Outcome: Progressing   Problem: Metabolic: Goal: Ability to maintain appropriate glucose levels will improve Outcome: Progressing   Problem: Nutritional: Goal: Maintenance of adequate nutrition will improve Outcome: Progressing Goal: Progress toward achieving an optimal weight will improve Outcome: Progressing   Problem: Skin Integrity: Goal: Risk for impaired skin integrity will decrease Outcome: Progressing   Problem: Tissue Perfusion: Goal: Adequacy of tissue perfusion will improve Outcome: Progressing

## 2022-09-11 NOTE — Plan of Care (Signed)
  Problem: Education: Goal: Knowledge of General Education information will improve Description Including pain rating scale, medication(s)/side effects and non-pharmacologic comfort measures Outcome: Progressing   Problem: Health Behavior/Discharge Planning: Goal: Ability to manage health-related needs will improve Outcome: Progressing   

## 2022-09-11 NOTE — Consult Note (Addendum)
[facility] Nurse Consult  follow-up Note: Reason for Consult: Surgical team in earlier to assess abd wound.  Beefy red, no further bleeding, surgicell removed. Requested to reapply Vac dressing to abd full thickness post-op wound.  Pt medicated for pain prior to the procedure and tolerated without c/o pain.  Applied barrier ring to lower wound edge to attempt to maintain a seal, then one piece black form to 133mm cont suction.  Supplies left at bedside; communicated via Secure chat with surgical PA that there will be minimal staffing of the [place] team for multiple sites on Fri, so Kathlee Nations Simian agrees to perform the Vac dressing change on that day  Discussed the plan with the patient. She denies need for further ostomy teaching; pouch is intact with good seal, mod amt stool.  4 sets of each supply are at the bedside; use barrier rings, [place] # [phone] and flexible convex pouches Kellie Simmering #[phone].  Pt watched pouch change procedure yesterday and asked appropriate questions  She was able to open and close the velcro to empty. Reviewed pouching routines and ordering supplies. Pt states her daughter is an Therapist, sports and was present for a previous pouch change and teaching session and she feels prepared for home when she is discharged.  Thank-you,  Julien Girt MSN, [place], [place], [place], [place]

## 2022-09-11 NOTE — Progress Notes (Signed)
PROGRESS NOTE    Kristin Gibson  ZJQ:[id number] DOB: 03/31/1956 DOA: 08/21/2022 PCP: Woodroe Chen, MD    Brief Narrative:   Kristin Gibson is a 66 y.o. female with past medical history significant for morbid obesity s/p gastric sleeve surgery February 2023, recurrent diverticulitis in which she recently completed 2 rounds of antibiotics who presented to [facility] ED on 8/29 with progressive/recurrent abdominal pain, fever/chills.  Work-up in the ED notable for recurrent sigmoid diverticulitis.  Patient was started on IV antibiotics.  General surgery was consulted.  Initially admitted to the hospital service.  Patient underwent exploratory laparotomy, sigmoid colectomy, descending colostomy with wound VAC placement by Dr. Gerrit Friends on 08/31/2022.  Care was transferred to the general surgery service on 09/03/2022.  Assessment & Plan:   Recurrent sigmoid diverticulitis Postoperative pelvic abscess w/ enterococcus faecium Patient presenting to ED with progressive abdominal pain associate with fever/chills.  Work-up revealing recurrent sigmoid diverticulitis.  Patient recently completed 2 courses of outpatient antibiotics which she has failed.  General surgery was consulted and patient underwent exploratory laparotomy with sigmoid colectomy, descending colostomy with wound VAC placement by Dr. Gerrit Friends on 08/31/2022.  Repeat CT abdomen/pelvis with contrast 9/13 notable for central pelvic fluid collection measuring 6.3 cm with thin peripheral enhancement consistent with early abscess and additional smaller peritoneal fluid collections, no free air or antibiotics static breakdown, no bowel obstruction or ileus. --Started on TPN 9/15, started soft diet 9/17 per [facility] --Vancomycin --s/p IR pelvic drain placement 9/14 --Culture from drain placement 9/14 with few Enterococcus faecium --D5 half NS with 20 mEq KCL at 142mL/h --Wound care, pain control, diet per general surgery  Essential hypertension --  Amlodipine 10 mg p.o. daily -- Clonidine 0.1 mg p.o. qPM -- Hydralazine 5 mg IV q4h PRN SBP >170 or DBP >95  GERD --Protonix 40 mg IV daily  Hypokalemia Hypomagnesemia Potassium 4.2 this morning.  Continue monitor electrolytes intermittently  Morbid obesity Body mass index is 36.48 kg/m.  Discussed with patient needs for aggressive lifestyle changes/weight loss as this complicates all facets of care.  Outpatient follow-up with PCP.   DVT prophylaxis: SCD's Start: 08/30/22 1310    Code Status: Full Code Family Communication: No family present at bedside this morning  Disposition Plan:  Level of care: Med-Surg Status is: Inpatient Remains inpatient appropriate because: Per primary, general surgery     Procedures:  Exploratory laparotomy with sigmoid colectomy, descending colostomy with wound VAC placement, Dr. Gerrit Friends on 08/31/2022.   Antimicrobials:  Ceftriaxone 8/29 - 8/2 Metronidazole 8/29 - 9/12 Cefepime 9/3 - 9/12 Zosyn 9/14>>   Subjective: Patient seen examined bedside, resting comfortably.  Lying in bed.  Wound VAC replaced by general surgery this morning.  Was able to tolerate some crackers and applesauce overnight but did have reported emesis yesterday.  Remains on TPN.  Blood pressure remains controlled and electrolytes within normal limits.  No other specific complaints or concerns at this time. Denies headache, no dizziness, no chest pain, no palpitations, no shortness of breath, no fever/chills/night sweats, no nausea/vomiting, no focal weakness, no fatigue, no cough/congestion, no paresthesias.  No acute events overnight per nursing staff.  Objective: Vitals:   09/10/22 0752 09/10/22 1359 09/10/22 1604 09/11/22 0500  BP:  (!) 157/88  121/78  Pulse:  88  90  Resp: 19 18 19 18   Temp:  99 F (37.2 C)  98.2 F (36.8 C)  TempSrc:  Oral  Oral  SpO2:  97%  99%  Weight:  Height:        Intake/Output Summary (Last 24 hours) at 09/11/2022 1040 Last data  filed at 09/11/2022 0500 Gross per 24 hour  Intake 2241.17 ml  Output 400 ml  Net 1841.17 ml   Filed Weights   08/21/22 2220 08/31/22 0815  Weight: 112.5 kg 112 kg    Examination:  Physical Exam: GEN: NAD, alert and oriented x 3, obese HEENT: NCAT, PERRL, EOMI, sclera clear, MMM PULM: CTAB w/o wheezes/crackles, normal respiratory effort, on room air CV: RRR w/o M/G/R GI: abd soft, NTND, + BS, wound VAC and ostomy noted, JP drain noted with serosanguineous fluid.  MSK: no peripheral edema, moves all extremities independently NEURO: CN II-XII intact, no focal deficits, sensation to light touch intact PSYCH: normal mood/affect Integumentary: Surgical incision site noted with wound VAC in place, ostomy noted in place, JP drain noted, otherwise no other concerning rashes/lesions/wounds on exposed areas     Data Reviewed: I have personally reviewed following labs and imaging studies  CBC: Recent Labs  Lab 09/05/22 0351 09/06/22 0401 09/07/22 0416 09/11/22 0402  WBC 10.2 9.7 9.7 8.3  NEUTROABS 8.0* 7.3  --  6.3  HGB 10.6* 11.1* 10.3* 8.8*  HCT 33.5* 33.5* 31.1* 28.3*  MCV 89.8 84.8 85.7 88.7  PLT 311 367 338 335   Basic Metabolic Panel: Recent Labs  Lab 09/06/22 0401 09/07/22 0416 09/08/22 0331 09/09/22 0553 09/10/22 0511 09/11/22 0402  NA 134* 134* 139 135 138 137  K 3.1* 3.8 3.8 3.9 4.1 4.2  CL 100 101 108 106 108 107  CO2 27 26 26 24 24 24   GLUCOSE 132* 115* 116* 113* 99 118*  BUN 6* 7* 9 12 14 14   CREATININE 0.63 0.61 0.70 0.50 0.52 0.68  CALCIUM 8.7* 8.4* 8.7* 8.4* 8.8* 8.8*  MG 1.5* 2.0 2.0 1.8 2.0  --   PHOS 2.7  --  4.2 3.6 3.2  --    GFR: Estimated Creatinine Clearance: 92.3 mL/min (by C-G formula based on SCr of 0.68 mg/dL). Liver Function Tests: Recent Labs  Lab 09/08/22 0331 09/10/22 0511  AST 17 15  ALT 10 10  ALKPHOS 54 59  BILITOT 0.5 0.5  PROT 5.8* 6.5  ALBUMIN 2.2* 2.4*   No results for input(s): "LIPASE", "AMYLASE" in the last 168  hours. No results for input(s): "AMMONIA" in the last 168 hours. Coagulation Profile: Recent Labs  Lab 09/06/22 0401  INR 1.3*   Cardiac Enzymes: No results for input(s): "CKTOTAL", "CKMB", "CKMBINDEX", "TROPONINI" in the last 168 hours. BNP (last 3 results) No results for input(s): "PROBNP" in the last 8760 hours. HbA1C: No results for input(s): "HGBA1C" in the last 72 hours.  CBG: Recent Labs  Lab 09/10/22 1651 09/10/22 2058 09/11/22 0014 09/11/22 0504 09/11/22 0802  GLUCAP 121* 141* 136* 111* 125*   Lipid Profile: Recent Labs    09/10/22 0511  TRIG 100   Thyroid Function Tests: No results for input(s): "TSH", "T4TOTAL", "FREET4", "T3FREE", "THYROIDAB" in the last 72 hours. Anemia Panel: No results for input(s): "VITAMINB12", "FOLATE", "FERRITIN", "TIBC", "IRON", "RETICCTPCT" in the last 72 hours. Sepsis Labs: No results for input(s): "PROCALCITON", "LATICACIDVEN" in the last 168 hours.  Recent Results (from the past 240 hour(s))  Aerobic/Anaerobic Culture w Gram Stain (surgical/deep wound)     Status: None (Preliminary result)   Collection Time: 09/06/22 12:53 PM   Specimen: Abdomen; Abscess  Result Value Ref Range Status   Specimen Description ABDOMEN  Final   Special Requests NONE  Final   Gram Stain   Final    MODERATE WBC PRESENT,BOTH PMN AND MONONUCLEAR NO ORGANISMS SEEN    Culture   Final    FEW ENTEROCOCCUS FAECIUM NO ANAEROBES ISOLATED; CULTURE IN PROGRESS FOR 5 DAYS    Report Status PENDING  Incomplete   Organism ID, Bacteria ENTEROCOCCUS FAECIUM  Final      Susceptibility   Enterococcus faecium - MIC*    AMPICILLIN >=32 RESISTANT Resistant     VANCOMYCIN <=0.5 SENSITIVE Sensitive     GENTAMICIN SYNERGY Value in next row Resistant      [facility] at [facility] [address], [place], [place] [address]    * FEW ENTEROCOCCUS FAECIUM         Radiology Studies: No results found.      Scheduled Meds:  acetaminophen   1,000 mg Oral Q6H   amLODipine  10 mg Oral Q supper   Chlorhexidine Gluconate Cloth  6 each Topical Daily   cloNIDine  0.1 mg Oral Q supper   gabapentin  300 mg Oral TID   insulin aspart  0-15 Units Subcutaneous Q4H   lip balm   Topical BID   pantoprazole (PROTONIX) IV  40 mg Intravenous QHS   sodium chloride flush  10-40 mL Intracatheter Q12H   sodium chloride flush  3 mL Intravenous Q12H   sodium chloride flush  5 mL Intracatheter Q8H   traZODone  50 mg Oral QHS   Continuous Infusions:  sodium chloride     dextrose 5 % and 0.45 % NaCl with KCl 20 mEq/L 30 mL/hr at 09/11/22 0457   methocarbamol (ROBAXIN) IV Stopped (09/11/22 0234)   ondansetron (ZOFRAN) IV     TPN ADULT (ION) 70 mL/hr at 09/11/22 0457   TPN ADULT (ION)     vancomycin 1,000 mg (09/11/22 0613)     LOS: 20 days    Time spent: 46 minutes spent on chart review, discussion with nursing staff, consultants, updating family and interview/physical exam; more than 50% of that time was spent in counseling and/or coordination of care.    Kalisha Keadle J [place] DO [facility] Available via Epic secure chat 7am-7pm After these hours, please refer to coverage provider listed on [email or web address] 09/11/2022, 10:40 AM

## 2022-09-11 NOTE — Progress Notes (Signed)
PHARMACY - TOTAL PARENTERAL NUTRITION CONSULT NOTE   Indication: Prolonged ileus  Patient Measurements: Height: 5\' 9"  (175.3 cm) Weight: 112 kg (247 lb) IBW/kg (Calculated) : 66.2 TPN AdjBW (KG): 77.7 Body mass index is 36.48 kg/m.  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  9/8: Exploratory laparotomy, sigmoid colectomy, descending colostomy, VAC dressing placement for Diverticulitis with Colonic Obstruction  -9/13: CT 9/13 w/ central pelvic fluid collection measuring 6.3cm -9/14: IR drainage of fluid collection -9/15: Ongoing nausea and some belching, no vomiting. No gas/stool in ostomy pouch. See RD note 9/14. Con't NPO and start TPN for prolonged ileus.  Glucose / Insulin: pre-DM with A1C 6 (01/2022).  - Glucose has been < 150, controlled  - 12 units of insulin used in last 24 hours  Electrolytes: WNL Renal: Scr <1 Hepatic: albumin 2.4, LFTs WNL  Intake / Output; MIVF:  - D5 1/2 NS 20 KCl at 30 ml/hr GI Imaging: - 9/13: CT 9/13 w/ central pelvic fluid collection measuring 6.3cm  GI Surgeries / Procedures:  - 9/8: Exploratory laparotomy, sigmoid colectomy, descending colostomy, VAC dressing placement for Diverticulitis with Colonic Obstruction  - 9/14: IR drainage of fluid collection/abscess  Central access: PICC TPN start date: 09/06/22  Nutritional Goals: Goal TPN rate is 70 mL/hr (provides 95 g of protein and 1774 kcals per day)  RD Assessment: Estimated Needs Total Energy Estimated Needs: 1700-1900 Total Protein Estimated Needs: 95-105g Total Fluid Estimated Needs: 1.9L/day  Current Nutrition:  TPN, soft diet start  9/17   Plan:  Continue TPN at goal rate 70 mL/hr Monitor for diet advancement and patient tolerating. Plan to wean TPN once patient tolerating and better meeting goal needs via enteral nutrition.  Electrolytes in TPN:  Na 50 mEq/L K 50 mEq/L Ca 5 mEq/L  Mg 8 mEq/L  Phos 10 mmol/L  Cl:Ac 1:1 (standard) Add standard MVI and trace elements to TPN Continue Moderate SSI q4h and adjust as needed  Continue MIVF at 30 mL/hr per primary MD Monitor TPN labs on Mon/Thurs, and PRN  Tawnya Crook, PharmD, BCPS Clinical Pharmacist 09/11/2022 7:23 AM

## 2022-09-11 NOTE — Progress Notes (Signed)
11 Days Post-Op   Subjective/Chief Complaint: Colostomy functioning. Patient is still having some nausea and vomiting after eating. Able to keep down crackers and apple sauce last night. Foley out, voiding spontaneously.  Objective: Vital signs in last 24 hours: Temp:  [98.2 F (36.8 C)-99 F (37.2 C)] 98.2 F (36.8 C) (09/19 0500) Pulse Rate:  [88-90] 90 (09/19 0500) Resp:  [18-19] 18 (09/19 0500) BP: (121-157)/(78-88) 121/78 (09/19 0500) SpO2:  [97 %-99 %] 99 % (09/19 0500) Last BM Date : 09/10/22  Intake/Output from previous day: 09/18 0701 - 09/19 0700 In: 2241.2 [I.V.:1364.5; IV Piggyback:876.7] Out: 450 [Urine:400; Stool:50] Intake/Output this shift: No intake/output data recorded.   Gen:  Alert, NAD, pleasant Abd: Soft, nondistended. Midline wound granulating, fascia in tact. Surgicel removed, no active bleeding. Colostomy productive of soft stool. R transgluteal drain with scant serous drainage.    BMET Recent Labs    09/10/22 0511 09/11/22 0402  NA 138 137  K 4.1 4.2  CL 108 107  CO2 24 24  GLUCOSE 99 118*  BUN 14 14  CREATININE 0.52 0.68  CALCIUM 8.8* 8.8*   PT/INR No results for input(s): "LABPROT", "INR" in the last 72 hours. ABG No results for input(s): "PHART", "HCO3" in the last 72 hours.  Invalid input(s): "PCO2", "PO2"  Studies/Results: No results found.  Anti-infectives: Anti-infectives (From admission, onward)    Start     Dose/Rate Route Frequency Ordered Stop   09/10/22 0600  vancomycin (VANCOCIN) IVPB 1000 mg/200 mL premix        1,000 mg 200 mL/hr over 60 Minutes Intravenous Every 12 hours 09/09/22 1620     09/09/22 1715  vancomycin (VANCOREADY) IVPB 2000 mg/400 mL        2,000 mg 200 mL/hr over 120 Minutes Intravenous  Once 09/09/22 1620 09/09/22 1955   09/06/22 1400  piperacillin-tazobactam (ZOSYN) IVPB 3.375 g        3.375 g 12.5 mL/hr over 240 Minutes Intravenous Every 8 hours 09/06/22 1302     09/02/22 1400  ceFEPIme  (MAXIPIME) 2 g in sodium chloride 0.9 % 100 mL IVPB        2 g 200 mL/hr over 30 Minutes Intravenous Every 8 hours 09/02/22 1015 09/05/22 0103   08/31/22 0600  cefoTEtan (CEFOTAN) 2 g in sodium chloride 0.9 % 100 mL IVPB  Status:  Discontinued        2 g 200 mL/hr over 30 Minutes Intravenous On call to O.R. 08/30/22 1316 08/31/22 1505   08/26/22 1400  ceFEPIme (MAXIPIME) 2 g in sodium chloride 0.9 % 100 mL IVPB  Status:  Discontinued        2 g 200 mL/hr over 30 Minutes Intravenous Every 8 hours 08/26/22 0848 09/02/22 1015   08/23/22 0400  cefTRIAXone (ROCEPHIN) 2 g in sodium chloride 0.9 % 100 mL IVPB  Status:  Discontinued        2 g 200 mL/hr over 30 Minutes Intravenous Every 24 hours 08/22/22 0345 08/26/22 0848   08/22/22 1400  metroNIDAZOLE (FLAGYL) IVPB 500 mg        500 mg 100 mL/hr over 60 Minutes Intravenous Every 12 hours 08/22/22 0345 09/05/22 0449   08/22/22 0100  cefTRIAXone (ROCEPHIN) 2 g in sodium chloride 0.9 % 100 mL IVPB        2 g 200 mL/hr over 30 Minutes Intravenous  Once 08/22/22 0047 08/22/22 0226   08/22/22 0100  metroNIDAZOLE (FLAGYL) IVPB 500 mg  500 mg 100 mL/hr over 60 Minutes Intravenous  Once 08/22/22 0047 08/22/22 9030       Assessment/Plan:  POD 11 s/p Exploratory laparotomy, sigmoid colectomy, descending colostomy, VAC dressing placement for Diverticulitis with Colonic Obstruction by Dr. Harlow Asa on 08/31/22 - Path benign  - CT 9/13 w/ central pelvic fluid collection measuring 6.3cm. s/p IR drainage 9/14, Cx- ENTEROCOCCUS FAECIUM  - Patient with ongoing nausea, however colostomy is functioning and abdomen is soft. Ok for soft diet, but patient should limit to small amounts at a time given ongoing nausea, which I discussed with her today. Anticipate nausea will improve now that she is having bowel function. - Continue TPN until PO intake improves - Replaced wound vac today - WOCN following for new ostomy education - Mobilize, continue therapies.  Currently recommended for [facility] PT/OT.  - TOC consult for [facility] PT/OT/RN - Daily labs   FEN -soft diet, TPN VTE - SCDs, Lovenox ID - WBC normal, afebrile, no drain output. Discontinue Zosyn.   - Pr TRH -  HTN GERD Electrolyte abnormalities   LOS: 20 days    Dwan Bolt MD  09/11/2022

## 2022-09-12 ENCOUNTER — Inpatient Hospital Stay (HOSPITAL_COMMUNITY): Payer: Medicare HMO

## 2022-09-12 DIAGNOSIS — K5732 Diverticulitis of large intestine without perforation or abscess without bleeding: Secondary | ICD-10-CM | POA: Diagnosis not present

## 2022-09-12 HISTORY — PX: IR CATHETER TUBE CHANGE: IMG717

## 2022-09-12 LAB — GLUCOSE, CAPILLARY
Glucose-Capillary: 110 mg/dL — ABNORMAL HIGH (ref 70–99)
Glucose-Capillary: 115 mg/dL — ABNORMAL HIGH (ref 70–99)
Glucose-Capillary: 116 mg/dL — ABNORMAL HIGH (ref 70–99)
Glucose-Capillary: 118 mg/dL — ABNORMAL HIGH (ref 70–99)
Glucose-Capillary: 123 mg/dL — ABNORMAL HIGH (ref 70–99)
Glucose-Capillary: 177 mg/dL — ABNORMAL HIGH (ref 70–99)

## 2022-09-12 MED ORDER — LIDOCAINE HCL 1 % IJ SOLN
INTRAMUSCULAR | Status: AC
  Filled 2022-09-12: qty 20

## 2022-09-12 MED ORDER — TRAVASOL 10 % IV SOLN
INTRAVENOUS | Status: AC
  Filled 2022-09-12: qty 957.6

## 2022-09-12 MED ORDER — MORPHINE SULFATE (PF) 2 MG/ML IV SOLN
2.0000 mg | INTRAVENOUS | Status: DC | PRN
  Administered 2022-09-12 – 2022-09-19 (×26): 2 mg via INTRAVENOUS
  Filled 2022-09-12 (×26): qty 1

## 2022-09-12 MED ORDER — OXYCODONE HCL 5 MG PO TABS
5.0000 mg | ORAL_TABLET | ORAL | Status: DC | PRN
  Administered 2022-09-17 – 2022-09-20 (×9): 10 mg via ORAL
  Filled 2022-09-12 (×11): qty 2

## 2022-09-12 MED ORDER — IOHEXOL 300 MG/ML  SOLN
50.0000 mL | Freq: Once | INTRAMUSCULAR | Status: AC | PRN
  Administered 2022-09-12: 10 mL

## 2022-09-12 MED ORDER — METOCLOPRAMIDE HCL 5 MG/ML IJ SOLN
5.0000 mg | Freq: Three times a day (TID) | INTRAMUSCULAR | Status: DC
  Administered 2022-09-12 – 2022-09-20 (×24): 5 mg via INTRAVENOUS
  Filled 2022-09-12 (×25): qty 2

## 2022-09-12 NOTE — Progress Notes (Signed)
PT Cancellation Note  Patient Details Name: [name] MRN: [id number] DOB: [birth year]   Cancelled Treatment:    Reason Eval/Treat Not Completed: Medical issues which prohibited therapy (pt is having nausea and vomiting. She reports she was able to walk the full unit earlier this morning. Will follow.)   Philomena Doheny PT 09/12/2022  Acute Rehabilitation Services  Office [phone]

## 2022-09-12 NOTE — Progress Notes (Signed)
PROGRESS NOTE  Kristin Gibson  DOB: Jan 06, 1956  PCP: Marjory Sneddon, MD EYC:[id number]  DOA: 08/21/2022  LOS: 51 days  Hospital Day: 23  Brief narrative: Kristin Gibson is a 66 y.o. female with PMH significant for morbid obesity, OSA on CPAP, prediabetes, HTN, GERD, arthritis, back pain who underwent gastric sleeve surgery in February 2023.  She also has recurrent diverticulitis and recently completed 2 rounds of antibiotics. On 8/29, patient presented to ED with progressive abdominal pain, fever, chills. Work-up in the ED notable for recurrent sigmoid diverticulitis.   Patient was started on IV antibiotics.   Initially admitted to the hospital service.  General surgery was consulted.   Care was later switched to general surgery of the primary team Hospitalist service following as consult. See below for details.  Subjective: Patient was seen and examined this morning.  Pleasant elderly African-American female.  Lying down in bed.  Pain controlled.  Remains on TPN.   Patient states that she was not able to tolerate soft diet and hence general surgery team has switched her back to clear liquid diet.  Assessment and plan: Recurrent sigmoid diverticulitis  s/p Exploratory laparotomy, sigmoid colectomy, descending colostomy, VAC dressing placement for Diverticulitis with Colonic Obstruction by Dr. Harlow Asa on 08/31/22 Presented with abdominal pain, fever, chills. CT scan notable for recurrent sigmoid diverticulitis Patient was started on IV antibiotics despite which symptoms did not improve. 9/8, patient underwent exploratory laparotomy, sigmoid colectomy, descending colostomy with wound VAC placement by Dr. Harlow Asa.  Postoperative pelvic abscess w/ enterococcus faecium 9/13, repeat CT abdomen/pelvis notable for central pelvic fluid collection measuring 6.3 cm  9/14, status post IR drainage.   9/19, repeat CT scan showed improving size of pelvic collection down to 4.3 x 3.5 cm  which is significantly smaller compared to prior exam. Wound culture from 9/14 grew Enterococcus faecium.  Currently on IV vancomycin For nutrition, patient is currently on TPN since 9/15.  General surgery following. Patient states that she was not able to tolerate soft diet and hence general surgery team has switched her back to clear liquid diet.  Hypokalemia/hypomagnesemia Improved with replacement Recent Labs  Lab 09/06/22 0401 09/07/22 0416 09/08/22 0331 09/09/22 0553 09/10/22 0511 09/11/22 0402  K 3.1* 3.8 3.8 3.9 4.1 4.2  MG 1.5* 2.0 2.0 1.8 2.0  --   PHOS 2.7  --  4.2 3.6 3.2  --    Postoperative anemia Baseline hemoglobin normal over 12.  Postprocedure, hemoglobin is running low, lowest of 8.8 on 9/19.  No active blood loss.  Continue to monitor hemoglobin.  Transfuse if less than 7. Recent Labs    09/04/22 0343 09/05/22 0351 09/06/22 0401 09/07/22 0416 09/11/22 0402  HGB 9.6* 10.6* 11.1* 10.3* 8.8*  MCV 89.9 89.8 84.8 85.7 88.7   Essential hypertension Amlodipine 10 mg p.o. daily, Clonidine 0.1 mg p.o. qPM along with hydralazine as needed. Continue to monitor blood pressure.   GERD Protonix 40 mg IV daily  Morbid obesity  Body mass index is 36.48 kg/m. Patient has been advised to make an attempt to improve diet and exercise patterns to aid in weight loss.   Goals of care   Code Status: Full Code    Mobility: Encourage ambulation  Skin assessment:     Nutritional status:  Body mass index is 36.48 kg/m.  Nutrition Problem: Increased nutrient needs Etiology: post-op healing (h/o gastric sleeve) Signs/Symptoms: estimated needs     Diet:  Diet Order  Diet clear liquid Room service appropriate? Yes; Fluid consistency: Thin  Diet effective now                   DVT prophylaxis:  SCD's Start: 08/30/22 1310   Antimicrobials: IV vancomycin Fluid: Currently on D5 half NS with potassium at 30 mill per hour, TPN Family  Communication: None at bedside  Status is: Inpatient  Continue in-hospital care because: Ongoing recovery from sigmoid colectomy Level of care: Med-Surg   Dispo: The patient is from: Home              Anticipated d/c is to: Home              Patient currently is not medically stable to d/c.   Difficult to place patient No     Infusions:   sodium chloride     dextrose 5 % and 0.45 % NaCl with KCl 20 mEq/L 30 mL/hr at 09/12/22 1200   methocarbamol (ROBAXIN) IV 220 mL/hr at 09/12/22 1200   ondansetron (ZOFRAN) IV     TPN ADULT (ION) 70 mL/hr at 09/12/22 1200   TPN ADULT (ION)     vancomycin Stopped (09/12/22 0720)    Scheduled Meds:  acetaminophen  1,000 mg Oral Q6H   amLODipine  10 mg Oral Q supper   Chlorhexidine Gluconate Cloth  6 each Topical Daily   cloNIDine  0.1 mg Oral Q supper   gabapentin  300 mg Oral TID   insulin aspart  0-15 Units Subcutaneous Q4H   lip balm   Topical BID   metoCLOPramide (REGLAN) injection  5 mg Intravenous Q8H   pantoprazole (PROTONIX) IV  40 mg Intravenous QHS   sodium chloride flush  10-40 mL Intracatheter Q12H   sodium chloride flush  3 mL Intravenous Q12H   sodium chloride flush  5 mL Intracatheter Q8H   traZODone  50 mg Oral QHS    PRN meds: sodium chloride, alum & mag hydroxide-simeth, diphenhydrAMINE, hydrALAZINE, hydrocortisone cream, magic mouthwash, melatonin, menthol-cetylpyridinium, morphine injection, ondansetron (ZOFRAN) IV **OR** ondansetron (ZOFRAN) IV, oxyCODONE, phenol, prochlorperazine, sodium chloride flush, sodium chloride flush   Antimicrobials: Anti-infectives (From admission, onward)    Start     Dose/Rate Route Frequency Ordered Stop   09/10/22 0600  vancomycin (VANCOCIN) IVPB 1000 mg/200 mL premix        1,000 mg 200 mL/hr over 60 Minutes Intravenous Every 12 hours 09/09/22 1620     09/09/22 1715  vancomycin (VANCOREADY) IVPB 2000 mg/400 mL        2,000 mg 200 mL/hr over 120 Minutes Intravenous  Once 09/09/22  1620 09/09/22 1955   09/06/22 1400  piperacillin-tazobactam (ZOSYN) IVPB 3.375 g  Status:  Discontinued        3.375 g 12.5 mL/hr over 240 Minutes Intravenous Every 8 hours 09/06/22 1302 09/11/22 0839   09/02/22 1400  ceFEPIme (MAXIPIME) 2 g in sodium chloride 0.9 % 100 mL IVPB        2 g 200 mL/hr over 30 Minutes Intravenous Every 8 hours 09/02/22 1015 09/05/22 0103   08/31/22 0600  cefoTEtan (CEFOTAN) 2 g in sodium chloride 0.9 % 100 mL IVPB  Status:  Discontinued        2 g 200 mL/hr over 30 Minutes Intravenous On call to O.R. 08/30/22 1316 08/31/22 1505   08/26/22 1400  ceFEPIme (MAXIPIME) 2 g in sodium chloride 0.9 % 100 mL IVPB  Status:  Discontinued        2  g 200 mL/hr over 30 Minutes Intravenous Every 8 hours 08/26/22 0848 09/02/22 1015   08/23/22 0400  cefTRIAXone (ROCEPHIN) 2 g in sodium chloride 0.9 % 100 mL IVPB  Status:  Discontinued        2 g 200 mL/hr over 30 Minutes Intravenous Every 24 hours 08/22/22 0345 08/26/22 0848   08/22/22 1400  metroNIDAZOLE (FLAGYL) IVPB 500 mg        500 mg 100 mL/hr over 60 Minutes Intravenous Every 12 hours 08/22/22 0345 09/05/22 0449   08/22/22 0100  cefTRIAXone (ROCEPHIN) 2 g in sodium chloride 0.9 % 100 mL IVPB        2 g 200 mL/hr over 30 Minutes Intravenous  Once 08/22/22 0047 08/22/22 0226   08/22/22 0100  metroNIDAZOLE (FLAGYL) IVPB 500 mg        500 mg 100 mL/hr over 60 Minutes Intravenous  Once 08/22/22 0047 08/22/22 0412       Objective: Vitals:   09/12/22 0452 09/12/22 1326  BP: 129/86 137/83  Pulse: 93 86  Resp: 16 18  Temp: 98.5 F (36.9 C) 98.8 F (37.1 C)  SpO2: 98% 98%    Intake/Output Summary (Last 24 hours) at 09/12/2022 1329 Last data filed at 09/12/2022 1200 Gross per 24 hour  Intake 3169.81 ml  Output 1150 ml  Net 2019.81 ml   Filed Weights   08/21/22 2220 08/31/22 0815  Weight: 112.5 kg 112 kg   Weight change:  Body mass index is 36.48 kg/m.   Physical Exam: General exam: Pleasant, elderly  African-American female. Skin: No rashes, lesions or ulcers. HEENT: Atraumatic, normocephalic, no obvious bleeding Lungs: Clear to auscultation bilaterally CVS: Regular rate and rhythm, no murmur GI/Abd soft, appropriate postop tenderness, ostomy site intact, bowel sound present CNS: Alert, awake, oriented x3 Psychiatry: Mood appropriate Extremities: No pedal edema, no calf tenderness  Data Review: I have personally reviewed the laboratory data and studies available.  F/u labs ordered Unresulted Labs (From admission, onward)     Start     Ordered   09/13/22 0500  CBC  Tomorrow morning,   R       Question:  Specimen collection method  Answer:  IV Team=IV Team collect   09/12/22 1013   09/10/22 0500  Comprehensive metabolic panel  (TPN Lab Panel)  Every Mon,Thu (0500),   R     Question:  Specimen collection method  Answer:  Lab=Lab collect   09/07/22 1140   09/10/22 0500  Magnesium  (TPN Lab Panel)  Every Mon,Thu (0500),   R     Question:  Specimen collection method  Answer:  Lab=Lab collect   09/07/22 1140   09/10/22 0500  Phosphorus  (TPN Lab Panel)  Every Mon,Thu (0500),   R     Question:  Specimen collection method  Answer:  Lab=Lab collect   09/07/22 1140   09/10/22 0500  Triglycerides  (TPN Lab Panel)  Every Monday (0500),   R     Question:  Specimen collection method  Answer:  Lab=Lab collect   09/07/22 1140            Signed, Lorin Glass, MD [facility] 09/12/2022

## 2022-09-12 NOTE — Plan of Care (Signed)
  Problem: Education: Goal: Knowledge of General Education information will improve Description: Including pain rating scale, medication(s)/side effects and non-pharmacologic comfort measures Outcome: Progressing   Problem: Health Behavior/Discharge Planning: Goal: Ability to manage health-related needs will improve Outcome: Progressing   Problem: Clinical Measurements: Goal: Ability to maintain clinical measurements within normal limits will improve Outcome: Progressing Goal: Will remain free from infection Outcome: Progressing Goal: Diagnostic test results will improve Outcome: Progressing Goal: Respiratory complications will improve Outcome: Progressing   Problem: Activity: Goal: Risk for activity intolerance will decrease Outcome: Progressing   Problem: Nutrition: Goal: Adequate nutrition will be maintained Outcome: Progressing   Problem: Coping: Goal: Level of anxiety will decrease Outcome: Progressing   Problem: Elimination: Goal: Will not experience complications related to bowel motility Outcome: Progressing Goal: Will not experience complications related to urinary retention Outcome: Progressing   Problem: Pain Managment: Goal: General experience of comfort will improve Outcome: Progressing   Problem: Safety: Goal: Ability to remain free from injury will improve Outcome: Progressing   Problem: Skin Integrity: Goal: Risk for impaired skin integrity will decrease Outcome: Progressing   Problem: Education: Goal: Understanding of discharge needs will improve Outcome: Progressing Goal: Verbalization of understanding of the causes of altered bowel function will improve Outcome: Progressing   Problem: Activity: Goal: Ability to tolerate increased activity will improve Outcome: Progressing   Problem: Bowel/Gastric: Goal: Gastrointestinal status for postoperative course will improve Outcome: Progressing   Problem: Health Behavior/Discharge  Planning: Goal: Identification of community resources to assist with postoperative recovery needs will improve Outcome: Progressing   Problem: Nutritional: Goal: Will attain and maintain optimal nutritional status will improve Outcome: Progressing   Problem: Clinical Measurements: Goal: Postoperative complications will be avoided or minimized Outcome: Progressing   Problem: Respiratory: Goal: Respiratory status will improve Outcome: Progressing   Problem: Skin Integrity: Goal: Will show signs of wound healing Outcome: Progressing   Problem: Education: Goal: Knowledge of General Education information will improve Description: Including pain rating scale, medication(s)/side effects and non-pharmacologic comfort measures Outcome: Progressing   Problem: Health Behavior/Discharge Planning: Goal: Ability to manage health-related needs will improve Outcome: Progressing   Problem: Clinical Measurements: Goal: Ability to maintain clinical measurements within normal limits will improve Outcome: Progressing Goal: Will remain free from infection Outcome: Progressing Goal: Diagnostic test results will improve Outcome: Progressing Goal: Respiratory complications will improve Outcome: Progressing Goal: Cardiovascular complication will be avoided Outcome: Progressing   Problem: Activity: Goal: Risk for activity intolerance will decrease Outcome: Progressing   Problem: Nutrition: Goal: Adequate nutrition will be maintained Outcome: Progressing   Problem: Coping: Goal: Level of anxiety will decrease Outcome: Progressing   Problem: Elimination: Goal: Will not experience complications related to bowel motility Outcome: Progressing Goal: Will not experience complications related to urinary retention Outcome: Progressing   Problem: Pain Managment: Goal: General experience of comfort will improve Outcome: Progressing   Problem: Safety: Goal: Ability to remain free from injury  will improve Outcome: Progressing   Problem: Skin Integrity: Goal: Risk for impaired skin integrity will decrease Outcome: Progressing

## 2022-09-12 NOTE — Progress Notes (Signed)
12 Days Post-Op  Subjective: CC: Patient reports pain only at her incision.  No new or worsening abdominal pain.  Has been dealing with ongoing nausea as well as vomiting.  She reports nausea worsens shortly after p.o. intake and usually vomits 15 minutes after solid p.o. intake.  She reports she is tolerating clears without vomiting.  Also notes that fentanyl seems to make her nauseous that she has been taking Zofran with this whenever she needs it.  Having ostomy output. Voiding since foley removal. Reports she mobilized in the halls yesterday.   Objective: Vital signs in last 24 hours: Temp:  [98.5 F (36.9 C)-99.3 F (37.4 C)] 98.5 F (36.9 C) (09/20 0452) Pulse Rate:  [91-96] 93 (09/20 0452) Resp:  [16-20] 16 (09/20 0452) BP: (129-155)/(86-96) 129/86 (09/20 0452) SpO2:  [98 %-99 %] 98 % (09/20 0452) Last BM Date : 09/11/22 (colostomy)  Intake/Output from previous day: 09/19 0701 - 09/20 0700 In: 60.4 [I.V.:55.4] Out: 1507 [Urine:1300; Emesis/NG output:200; Drains:7] Intake/Output this shift: No intake/output data recorded.  PE: Gen:  Alert, NAD, pleasant Card:  Reg Pulm:  CTAB, no W/R/R, effort normal Abd: Soft, no significant distention, appropriately tender around her incisions but otherwise nontender. No rigidity or guarding. Some BS. Midline wound with vac in place with good seal, scant output in vac cannister. JP drain with no output in bulb. Ext:  No LE edema  Psych: A&Ox3   Lab Results:  Recent Labs    09/11/22 0402  WBC 8.3  HGB 8.8*  HCT 28.3*  PLT 335   BMET Recent Labs    09/10/22 0511 09/11/22 0402  NA 138 137  K 4.1 4.2  CL 108 107  CO2 24 24  GLUCOSE 99 118*  BUN 14 14  CREATININE 0.52 0.68  CALCIUM 8.8* 8.8*   PT/INR No results for input(s): "LABPROT", "INR" in the last 72 hours. CMP     Component Value Date/Time   NA 137 09/11/2022 0402   K 4.2 09/11/2022 0402   CL 107 09/11/2022 0402   CO2 24 09/11/2022 0402   GLUCOSE 118 (H)  09/11/2022 0402   BUN 14 09/11/2022 0402   CREATININE 0.68 09/11/2022 0402   CALCIUM 8.8 (L) 09/11/2022 0402   PROT 6.5 09/10/2022 0511   ALBUMIN 2.4 (L) 09/10/2022 0511   AST 15 09/10/2022 0511   ALT 10 09/10/2022 0511   ALKPHOS 59 09/10/2022 0511   BILITOT 0.5 09/10/2022 0511   GFRNONAA >60 09/11/2022 0402   GFRAA >60 06/28/2020 0548   Lipase     Component Value Date/Time   LIPASE 26 08/21/2022 2306    Studies/Results: CT ABDOMEN PELVIS W CONTRAST  Result Date: 09/11/2022 CLINICAL DATA:  Status post pelvic abscess drainage. EXAM: CT ABDOMEN AND PELVIS WITH CONTRAST TECHNIQUE: Multidetector CT imaging of the abdomen and pelvis was performed using the standard protocol following bolus administration of intravenous contrast. RADIATION DOSE REDUCTION: This exam was performed according to the departmental dose-optimization program which includes automated exposure control, adjustment of the mA and/or kV according to patient size and/or use of iterative reconstruction technique. CONTRAST:  171mL OMNIPAQUE IOHEXOL 300 MG/ML  SOLN COMPARISON:  September 05, 2022. FINDINGS: Lower chest: Minimal left pleural effusion is noted with adjacent subsegmental atelectasis. Pericardial effusion is noted. Hepatobiliary: No focal liver abnormality is seen. No gallstones, gallbladder wall thickening, or biliary dilatation. Pancreas: Unremarkable. No pancreatic ductal dilatation or surrounding inflammatory changes. Spleen: Normal in size without focal abnormality. Adrenals/Urinary Tract: Adrenal  glands are noted. Bilateral renal cysts are noted for which no further follow-up is required. No hydronephrosis or renal obstruction is noted. Mild urinary bladder distention is noted. Stomach/Bowel: Status post gastric bypass. Colostomy is noted in left lower quadrant. Mildly dilated small bowel loops are noted which most likely represent ileus. Vascular/Lymphatic: No significant vascular findings are present. No enlarged  abdominal or pelvic lymph nodes. Reproductive: Status post hysterectomy. No adnexal masses. Other: Interval placement of right transgluteal pelvic drainage catheter. Fluid collection measures 4.3 x 3.5 cm which is significantly smaller compared to prior exam. Musculoskeletal: No acute or significant osseous findings. IMPRESSION: Interval placement of right transgluteal pelvic drainage catheter. Pelvic fluid collection now measures 4.3 x 3.5 cm which is significantly smaller compared to prior exam. Small pericardial effusion is noted. Minimal left pleural effusion is noted with adjacent subsegmental atelectasis. Mild urinary bladder distention is noted. Status post colostomy placement in the left lower quadrant. Electronically Signed   By: Marijo Conception M.D.   On: 09/11/2022 14:33    Anti-infectives: Anti-infectives (From admission, onward)    Start     Dose/Rate Route Frequency Ordered Stop   09/10/22 0600  vancomycin (VANCOCIN) IVPB 1000 mg/200 mL premix        1,000 mg 200 mL/hr over 60 Minutes Intravenous Every 12 hours 09/09/22 1620     09/09/22 1715  vancomycin (VANCOREADY) IVPB 2000 mg/400 mL        2,000 mg 200 mL/hr over 120 Minutes Intravenous  Once 09/09/22 1620 09/09/22 1955   09/06/22 1400  piperacillin-tazobactam (ZOSYN) IVPB 3.375 g  Status:  Discontinued        3.375 g 12.5 mL/hr over 240 Minutes Intravenous Every 8 hours 09/06/22 1302 09/11/22 0839   09/02/22 1400  ceFEPIme (MAXIPIME) 2 g in sodium chloride 0.9 % 100 mL IVPB        2 g 200 mL/hr over 30 Minutes Intravenous Every 8 hours 09/02/22 1015 09/05/22 0103   08/31/22 0600  cefoTEtan (CEFOTAN) 2 g in sodium chloride 0.9 % 100 mL IVPB  Status:  Discontinued        2 g 200 mL/hr over 30 Minutes Intravenous On call to O.R. 08/30/22 1316 08/31/22 1505   08/26/22 1400  ceFEPIme (MAXIPIME) 2 g in sodium chloride 0.9 % 100 mL IVPB  Status:  Discontinued        2 g 200 mL/hr over 30 Minutes Intravenous Every 8 hours 08/26/22  0848 09/02/22 1015   08/23/22 0400  cefTRIAXone (ROCEPHIN) 2 g in sodium chloride 0.9 % 100 mL IVPB  Status:  Discontinued        2 g 200 mL/hr over 30 Minutes Intravenous Every 24 hours 08/22/22 0345 08/26/22 0848   08/22/22 1400  metroNIDAZOLE (FLAGYL) IVPB 500 mg        500 mg 100 mL/hr over 60 Minutes Intravenous Every 12 hours 08/22/22 0345 09/05/22 0449   08/22/22 0100  cefTRIAXone (ROCEPHIN) 2 g in sodium chloride 0.9 % 100 mL IVPB        2 g 200 mL/hr over 30 Minutes Intravenous  Once 08/22/22 0047 08/22/22 0226   08/22/22 0100  metroNIDAZOLE (FLAGYL) IVPB 500 mg        500 mg 100 mL/hr over 60 Minutes Intravenous  Once 08/22/22 0047 08/22/22 0412        Assessment/Plan POD 12 s/p Exploratory laparotomy, sigmoid colectomy, descending colostomy, VAC dressing placement for Diverticulitis with Colonic Obstruction by Dr. Harlow Asa on 08/31/22 -  Path benign  - CT 9/13 w/ central pelvic fluid collection measuring 6.3 x 4.6cm. S/p IR drainage 9/14, Cx- enterococcus faecium only sensitive to Vanc. Changed to Vanc 9/18, plan 5d course. Repeat CT 9/19 w/ decrease in size of pelvic fluid collection now measuring 4.3 x 3.5cm. Drain with minimal output. IR planning to reposition today. - Patient with ongoing nausea and vomiting. Reports intolerance to solid food yesterday but doing okay with CLD this am. Will back down to CLD. Her CT showed some dilated small bowel but her colostomy is functioning and abdomen is soft. We discussed trying scheduled Reglan to see if this helps (also discussed risk of Tardive dyskinesia/EPS with patient and family). She is agreeable to trial of this. She is requiring Zofran with each dose of Fentanyl - will adjust meds to maximize non-narcotics and change PRN meds to see if this helps with nausea.  - Continue TPN until PO intake improves - Cont wound vac - next change Friday - WOCN following for new ostomy education. Monitor stoma.  As noted by my colleague there was  some necrosis but she was able to be digitized on 9/18 and the stoma appeared patent - currently having ostomy output.  - Mobilize, continue therapies. Currently recommended for [facility] PT/OT.  - TOC consult for [facility] PT/OT/RN - Daily labs   FEN - CLD, TPN VTE - SCDs, restart Lovenox in am if hgb stable ID - Vanc 9/18 >>    ABL anemia - hgb 8.8 yesterday from 10.3. She did have bleeding from midline wound on 9/18. No bloody output in vac cannister. Repeat CBC in am. No tachycardia or hypotension this am.  Hx Gastric Sleeve Feb 2023  HTN GERD   LOS: 21 days    Jillyn Ledger , [facility] 09/12/2022, 10:03 AM Please see Amion for pager number during day hours 7:00am-4:30pm

## 2022-09-12 NOTE — Consult Note (Addendum)
[place] Nurse ostomy consult note  Vac is intact with good seal to abd; surgical PA plans to change on Fri.  Ostomy pouch change and teaching session performed with patient; no family members in the room. Stoma type/location: Open area to abd, no stoma visible, below skin level, yellow slough Stomal assessment/size: 1 1/2 inches and oval Peristomal assessment: intact skin surrounding Output: 50cc liquid brown stool Ostomy pouching: 1pc.  Education provided: Pt was able to assist with shaping barrier ring and applying one piece flexible convex pouch.  She could open and close velcro to empty. Ordered more supplies to the bedside so patient will have 7 barrier rings, [place] # [phone] and [place] # [id number].  Enrolled patient in [place] program: Yes, previously  Pt could benefit from follow-up at the outpatient ostomy clinic, please refer if desired.   Surgical team will see patient on Friday as noted above. Thank-you,  Julien Girt MSN, [place], [place], [place], [place]

## 2022-09-12 NOTE — Progress Notes (Signed)
Nutrition Follow-up  DOCUMENTATION CODES:   Obesity unspecified  INTERVENTION:   -TPN management per Pharmacy  -Will continue to monitor plan and ability to advance diet  NUTRITION DIAGNOSIS:   Increased nutrient needs related to post-op healing (h/o gastric sleeve) as evidenced by estimated needs.  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 She was admitted with sigmoid diverticulitis and started on iv antibiotics.  8/29: admitted, NPO 9/1: CLD 9/2: NPO 9/3: CLD 9/5: FLD 9/7: NPO 9/8: s/p ex lap, sig colectomy, colostomy, VAC 9/9: CLD -> FLD 9/13: NPO 9/14: TPN initiated, drain placed 9/16: CLD 9/17: Soft diet 9/20: CLD  Patient expected to undergo drain exchange today. Was on soft diet but has been unable to tolerate anything, even crackers or applesauce. Has continued to have N/V. Changed backed to clear liquids.  Per surgery, may need NGT placed for suction. Will discuss supplements once pt is able to tolerate diet again.   TPN continues at goal of 70 ml/hr, providing 1774 kcals and 94g protein.  Admission weight: 248 lbs. Needs updated weight.  Medications: Reglan, D5 infusion, Zofran  Labs reviewed: CBGs: 110-177   NUTRITION - FOCUSED PHYSICAL EXAM:  No depletions noted   Diet Order:   Diet Order             Diet clear liquid Room service appropriate? Yes; Fluid consistency: Thin  Diet effective now                   EDUCATION NEEDS:   No education needs have been identified at this time  Skin:  Skin Assessment: Skin Integrity Issues: Skin Integrity Issues:: Incisions Incisions: 9/8 abdomen  Last  BM:  9/20 -colostomy  Height:   Ht Readings from Last 1 Encounters:  08/31/22 5\' 9"  (1.753 m)    Weight:   Wt Readings from Last 1 Encounters:  08/31/22 112 kg    BMI:  Body mass index is 36.48 kg/m.  Estimated Nutritional Needs:   Kcal:  1700-1900  Protein:  95-105g  Fluid:  1.9L/day  Clayton Bibles, MS, RD, LDN Inpatient Clinical Dietitian Contact information available via Amion

## 2022-09-12 NOTE — Progress Notes (Signed)
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 She was admitted with sigmoid diverticulitis and started on iv antibiotics. General surgery was consulted.  Patient underwent a exploratory laparotomy and colostomy on 08/31/2022. Pt s/p Right TG drain placement 9/14   OT comments  Today's treatment focused on completing low body dressing using AE to increase independence in dressing tasks. Patient was educated on how to use a Hip kit specifically reacher, sock aide, and shoe horn to perform donning/doffing of socks and shoes.  Patient simulated using reacher to donn underwear independently. Patient demonstrates ability to transfer in and out of bed, sit to stand, and managing clothing in standing. Patient has met OT goals and has no further OT needs.    Recommendations for follow up therapy are one component of a multi-disciplinary discharge planning process, led by the attending physician.  Recommendations may be updated based on patient status, additional functional criteria and insurance authorization.    Follow Up Recommendations  No OT follow up    Assistance Recommended at Discharge Intermittent Supervision/Assistance  Patient can return home with the following  A little help with bathing/dressing/bathroom;Assistance with cooking/housework;Help with stairs or ramp for entrance   Equipment Recommendations  BSC/3in1;Other (comment)    Recommendations for Other Services      Precautions / Restrictions Precautions Precautions: Fall Precaution Comments: colostomy, VAC, Rt TG drain Restrictions Weight Bearing Restrictions: No       Mobility Bed  Mobility Overal bed mobility: Needs Assistance Bed Mobility: Supine to Sit Rolling: Supervision              Transfers Overall transfer level: Needs assistance Equipment used: Rolling walker (2 wheels) Transfers: Sit to/from Stand Sit to Stand: Min guard           General transfer comment: increased time and effort, requested elevated bed     Balance Overall balance assessment: Mild deficits observed, not formally tested   Sitting balance-Leahy Scale: Good       Standing balance-Leahy Scale: Fair                             ADL either performed or assessed with clinical judgement   ADL Overall ADL's : Needs assistance/impaired                     Lower Body Dressing: Minimal assistance;Bed level Lower Body Dressing Details (indicate cue type and reason): Patient completed donn/doff of socks                    Extremity/Trunk Assessment Upper Extremity Assessment Upper Extremity Assessment: Overall WFL for tasks assessed   Lower Extremity Assessment Lower Extremity Assessment: Defer to PT evaluation   Cervical / Trunk Assessment Cervical / Trunk Assessment: Normal    Vision Patient Visual Report: No change from baseline     Perception     Praxis      Cognition Arousal/Alertness: Awake/alert Behavior During Therapy: WFL for tasks assessed/performed Overall Cognitive Status: Within Functional Limits for tasks assessed  Exercises      Shoulder Instructions       General Comments      Pertinent Vitals/ Pain       Pain Assessment Pain Score: 1  Pain Descriptors / Indicators: Sore  Home Living                                          Prior Functioning/Environment              Frequency           Progress Toward Goals  OT Goals(current goals can now be found in the care plan section)  Progress towards OT goals: Goals  met/education completed, patient discharged from OT  Acute Rehab OT Goals Potential to Achieve Goals: Good  Plan Discharge plan needs to be updated    Co-evaluation                 AM-PAC OT "6 Clicks" Daily Activity     Outcome Measure   Help from another person eating meals?: None Help from another person taking care of personal grooming?: A Little Help from another person toileting, which includes using toliet, bedpan, or urinal?: A Little Help from another person bathing (including washing, rinsing, drying)?: A Little Help from another person to put on and taking off regular upper body clothing?: A Little Help from another person to put on and taking off regular lower body clothing?: A Little 6 Click Score: 19    End of Session Equipment Utilized During Treatment: Rolling walker (2 wheels)      Activity Tolerance Patient tolerated treatment well   Patient Left in bed;with call bell/phone within reach;with bed alarm set   Nurse Communication          Time: 8016-5537 OT Time Calculation (min): 25 min  Charges: OT General Charges $OT Visit: 1 Visit OT Treatments $Self Care/Home Management : 23-37 mins  Charlann Lange, OTS Acute rehab services   Charlann Lange 09/12/2022, 12:25 PM

## 2022-09-12 NOTE — Progress Notes (Signed)
Referring Physician(s): White,C  Supervising Physician: Arne Cleveland  Patient Status:  [facility] - In-pt  Chief Complaint: Abdominal pain, nausea, vomiting, s/p partial colectomy with colostomy, postop pelvic abscess  S/p R TG drain placement on 9/14 by Dr. Maryelizabeth Kaufmann    Subjective: Pt laying in bed, NAD. PT at bedside.  Reports that she is feeling ok today, no N/V.  The drain is being flushed, it was flushed last night.   Allergies: Benazepril, Sulfa antibiotics, and Elemental sulfur  Medications: Prior to Admission medications   Medication Sig Start Date End Date Taking? Authorizing Provider  acetaminophen (TYLENOL) 500 MG tablet Take 1,000 mg by mouth daily as needed for mild pain. pain    Yes [provider]  amLODipine (NORVASC) 10 MG tablet Take 10 mg by mouth daily with supper. 08/08/17  Yes [provider]  amoxicillin (AMOXIL) 500 MG capsule Take 2,000 mg by mouth once.   Yes [provider]  amoxicillin-clavulanate (AUGMENTIN) 875-125 MG tablet Take 1 tablet by mouth 2 (two) times daily. 08/17/22  Yes [provider]  cloNIDine (CATAPRES) 0.1 MG tablet Take 0.1 mg by mouth daily with supper. 10/13/18  Yes [provider]  dicyclomine (BENTYL) 20 MG tablet Take 20 mg by mouth every 8 (eight) hours. 12/05/16  Yes [provider]  methocarbamol (ROBAXIN) 500 MG tablet Take 500 mg by mouth every 8 (eight) hours as needed for muscle spasms.   Yes [provider]  Multiple Vitamin (MULTIVITAMIN) tablet Take 1 tablet by mouth daily.   Yes [provider]  ondansetron (ZOFRAN-ODT) 4 MG disintegrating tablet Take 1 tablet (4 mg total) by mouth every 6 (six) hours as needed for nausea or vomiting. 02/20/22  Yes Clovis Riley, MD  pantoprazole (PROTONIX) 40 MG tablet Take 1 tablet (40 mg total) by mouth daily. 02/20/22  Yes Clovis Riley, MD  polyethylene glycol (MIRALAX / GLYCOLAX) 17 g packet Take 17 g by  mouth daily as needed (constipation).   Yes [provider]  Probiotic CAPS Take 1 capsule by mouth daily. 06/28/20  Yes Ward, Delice Bison, DO  tizanidine (ZANAFLEX) 2 MG capsule Take 2 mg by mouth every 8 (eight) hours as needed for muscle spasms.   Yes [provider]  traMADol (ULTRAM) 50 MG tablet Take 1 tablet (50 mg total) by mouth every 6 (six) hours as needed (pain). 02/20/22  Yes Clovis Riley, MD  gabapentin (NEURONTIN) 100 MG capsule Take 2 capsules (200 mg total) by mouth every 12 (twelve) hours. Patient not taking: Reported on 08/22/2022 02/20/22   Clovis Riley, MD     Vital Signs: BP 129/86 (BP Location: Left Arm)   Pulse 93   Temp 98.5 F (36.9 C) (Oral)   Resp 16   Ht 5\' 9"  (1.753 m)   Wt 247 lb (112 kg)   SpO2 98%   BMI 36.48 kg/m   Physical Exam Vitals reviewed.  Constitutional:      General: She is not in acute distress.    Appearance: She is well-developed. She is not ill-appearing.  HENT:     Head: Normocephalic.  Pulmonary:     Effort: Pulmonary effort is normal.  Skin:    General: Skin is warm and dry.     Comments: Positive r TG drain to suction bulb.  Dressing is clean, dry, and intact.  No fluid noted in the bulb. Drain flushed with resistance but does not aspirate.    Neurological:  Mental Status: She is alert and oriented to person, place, and time.  Psychiatric:        Mood and Affect: Mood normal.        Behavior: Behavior normal.     Imaging: CT ABDOMEN PELVIS W CONTRAST  Result Date: 09/11/2022 CLINICAL DATA:  Status post pelvic abscess drainage. EXAM: CT ABDOMEN AND PELVIS WITH CONTRAST TECHNIQUE: Multidetector CT imaging of the abdomen and pelvis was performed using the standard protocol following bolus administration of intravenous contrast. RADIATION DOSE REDUCTION: This exam was performed according to the departmental dose-optimization program which includes automated exposure control, adjustment of the mA and/or  kV according to patient size and/or use of iterative reconstruction technique. CONTRAST:  178mL OMNIPAQUE IOHEXOL 300 MG/ML  SOLN COMPARISON:  September 05, 2022. FINDINGS: Lower chest: Minimal left pleural effusion is noted with adjacent subsegmental atelectasis. Pericardial effusion is noted. Hepatobiliary: No focal liver abnormality is seen. No gallstones, gallbladder wall thickening, or biliary dilatation. Pancreas: Unremarkable. No pancreatic ductal dilatation or surrounding inflammatory changes. Spleen: Normal in size without focal abnormality. Adrenals/Urinary Tract: Adrenal glands are noted. Bilateral renal cysts are noted for which no further follow-up is required. No hydronephrosis or renal obstruction is noted. Mild urinary bladder distention is noted. Stomach/Bowel: Status post gastric bypass. Colostomy is noted in left lower quadrant. Mildly dilated small bowel loops are noted which most likely represent ileus. Vascular/Lymphatic: No significant vascular findings are present. No enlarged abdominal or pelvic lymph nodes. Reproductive: Status post hysterectomy. No adnexal masses. Other: Interval placement of right transgluteal pelvic drainage catheter. Fluid collection measures 4.3 x 3.5 cm which is significantly smaller compared to prior exam. Musculoskeletal: No acute or significant osseous findings. IMPRESSION: Interval placement of right transgluteal pelvic drainage catheter. Pelvic fluid collection now measures 4.3 x 3.5 cm which is significantly smaller compared to prior exam. Small pericardial effusion is noted. Minimal left pleural effusion is noted with adjacent subsegmental atelectasis. Mild urinary bladder distention is noted. Status post colostomy placement in the left lower quadrant. Electronically Signed   By: Marijo Conception M.D.   On: 09/11/2022 14:33    Labs:  CBC: Recent Labs    09/05/22 0351 09/06/22 0401 09/07/22 0416 09/11/22 0402  WBC 10.2 9.7 9.7 8.3  HGB 10.6* 11.1*  10.3* 8.8*  HCT 33.5* 33.5* 31.1* 28.3*  PLT 311 367 338 335     COAGS: Recent Labs    09/06/22 0401  INR 1.3*     BMP: Recent Labs    09/08/22 0331 09/09/22 0553 09/10/22 0511 09/11/22 0402  NA 139 135 138 137  K 3.8 3.9 4.1 4.2  CL 108 106 108 107  CO2 26 24 24 24   GLUCOSE 116* 113* 99 118*  BUN 9 12 14 14   CALCIUM 8.7* 8.4* 8.8* 8.8*  CREATININE 0.70 0.50 0.52 0.68  GFRNONAA >60 >60 >60 >60     LIVER FUNCTION TESTS: Recent Labs    08/23/22 0418 08/24/22 0419 09/08/22 0331 09/10/22 0511  BILITOT 0.5 0.5 0.5 0.5  AST 16 14* 17 15  ALT 14 13 10 10   ALKPHOS 50 48 54 59  PROT 7.0 7.0 5.8* 6.5  ALBUMIN 2.8* 2.8* 2.2* 2.4*     Assessment and Plan: Pt with hx sigmoid diverticulitis with prior exploratory laparotomy, sigmoid colectomy, descending colostomy with Dr. Harlow Asa on 08/31/2022; now with postop pelvic abscess, s/p rt TG drain placement 9/14 (12 fr to JP);   Patient underwent f/u CT yesterday due to low output  which showed smaller but persistent fluid collection.  Discussed with Dr. Vernard Gambles, patient is on schedule for drain reposition today pending IR schedule.   IR team notified.   Continue to flush the drain TID with 5 mL NS, document output q shift, dressing changes as needed or at least once in 2-3 days.  IR will continue to follow.    Electronically Signed: Tera Mater, PA-C 09/12/2022, 10:18 AM   I spent a total of 15 Minutes at the the patient's bedside AND on the patient's hospital floor or unit, greater than 50% of which was counseling/coordinating care for pelvic abscess drain

## 2022-09-12 NOTE — Progress Notes (Signed)
PHARMACY - TOTAL PARENTERAL NUTRITION CONSULT NOTE   Indication: Prolonged ileus  Patient Measurements: Height: 5\' 9"  (175.3 cm) Weight: 112 kg (247 lb) IBW/kg (Calculated) : 66.2 TPN AdjBW (KG): 77.7 Body mass index is 36.48 kg/m.  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  9/8: Exploratory laparotomy, sigmoid colectomy, descending colostomy, VAC dressing placement for Diverticulitis with Colonic Obstruction  -9/13: CT 9/13 w/ central pelvic fluid collection measuring 6.3cm -9/14: IR drainage of fluid collection -9/15: Ongoing nausea and some belching, no vomiting. No gas/stool in ostomy pouch. See RD note 9/14. Con't NPO and start TPN for prolonged ileus.  Glucose / Insulin: pre-DM with A1C 6 (01/2022).  - Glucose 177 this morning, but otherwise controlled  - 11 units of insulin used in last 24 hours  Electrolytes: WNL Renal: Scr <1 Hepatic: albumin 2.4, LFTs WNL  Intake / Output; MIVF:  - D5 1/2 NS 20 KCl at 30 ml/hr GI Imaging: - 9/13: CT 9/13 w/ central pelvic fluid collection measuring 6.3cm  GI Surgeries / Procedures:  - 9/8: Exploratory laparotomy, sigmoid colectomy, descending colostomy, VAC dressing placement for Diverticulitis with Colonic Obstruction  - 9/14: IR drainage of fluid collection/abscess  Central access: PICC TPN start date: 09/06/22  Nutritional Goals: Goal TPN rate is 70 mL/hr (provides 95 g of protein and 1774 kcals per day)  RD Assessment: Estimated Needs Total Energy Estimated Needs: 1700-1900 Total Protein Estimated Needs: 95-105g Total Fluid Estimated Needs: 1.9L/day  Current Nutrition:  TPN, soft  diet start 9/17   Plan:  Continue TPN at goal rate 70 mL/hr Monitor for diet advancement and patient tolerating. Plan to wean TPN once patient tolerating and better meeting goal needs via enteral nutrition.  Electrolytes in TPN:  Na 50 mEq/L K 50 mEq/L Ca 5 mEq/L  Mg 8 mEq/L  Phos 10 mmol/L  Cl:Ac 1:1 (standard) Add standard MVI and trace elements to TPN Continue Moderate SSI q4h and adjust as needed  Continue MIVF at 30 mL/hr per primary MD Monitor TPN labs on Mon/Thurs, and PRN  Tawnya Crook, PharmD, BCPS Clinical Pharmacist 09/12/2022 8:59 AM

## 2022-09-13 ENCOUNTER — Inpatient Hospital Stay (HOSPITAL_COMMUNITY): Payer: Medicare HMO

## 2022-09-13 DIAGNOSIS — K5732 Diverticulitis of large intestine without perforation or abscess without bleeding: Secondary | ICD-10-CM | POA: Diagnosis not present

## 2022-09-13 LAB — COMPREHENSIVE METABOLIC PANEL
ALT: 12 U/L (ref 0–44)
AST: 17 U/L (ref 15–41)
Albumin: 2.3 g/dL — ABNORMAL LOW (ref 3.5–5.0)
Alkaline Phosphatase: 72 U/L (ref 38–126)
Anion gap: 7 (ref 5–15)
BUN: 14 mg/dL (ref 8–23)
CO2: 23 mmol/L (ref 22–32)
Calcium: 8.8 mg/dL — ABNORMAL LOW (ref 8.9–10.3)
Chloride: 106 mmol/L (ref 98–111)
Creatinine, Ser: 0.46 mg/dL (ref 0.44–1.00)
GFR, Estimated: >60 mL/min (ref >60)
Glucose, Bld: 131 mg/dL — ABNORMAL HIGH (ref 70–99)
Potassium: 4.2 mmol/L (ref 3.5–5.1)
Sodium: 136 mmol/L (ref 135–145)
Total Bilirubin: 0.5 mg/dL (ref 0.3–1.2)
Total Protein: 6.4 g/dL — ABNORMAL LOW (ref 6.5–8.1)

## 2022-09-13 LAB — CBC
HCT: 28.2 % — ABNORMAL LOW (ref 36.0–46.0)
Hemoglobin: 9.1 g/dL — ABNORMAL LOW (ref 12.0–15.0)
MCH: 28.6 pg (ref 26.0–34.0)
MCHC: 32.3 g/dL (ref 30.0–36.0)
MCV: 88.7 fL (ref 80.0–100.0)
Platelets: 362 10*3/uL (ref 150–400)
RBC: 3.18 MIL/uL — ABNORMAL LOW (ref 3.87–5.11)
RDW: 15.4 % (ref 11.5–15.5)
WBC: 7.5 10*3/uL (ref 4.0–10.5)
nRBC: 0 % (ref 0.0–0.2)

## 2022-09-13 LAB — GLUCOSE, CAPILLARY
Glucose-Capillary: 134 mg/dL — ABNORMAL HIGH (ref 70–99)
Glucose-Capillary: 104 mg/dL — ABNORMAL HIGH (ref 70–99)
Glucose-Capillary: 123 mg/dL — ABNORMAL HIGH (ref 70–99)
Glucose-Capillary: 111 mg/dL — ABNORMAL HIGH (ref 70–99)
Glucose-Capillary: 114 mg/dL — ABNORMAL HIGH (ref 70–99)

## 2022-09-13 LAB — PHOSPHORUS: Phosphorus: 4.1 mg/dL (ref 2.5–4.6)

## 2022-09-13 LAB — MAGNESIUM: Magnesium: 1.8 mg/dL (ref 1.7–2.4)

## 2022-09-13 MED ORDER — ACETAMINOPHEN 10 MG/ML IV SOLN
1000.0000 mg | Freq: Four times a day (QID) | INTRAVENOUS | Status: AC
  Administered 2022-09-13 – 2022-09-14 (×4): 1000 mg via INTRAVENOUS
  Filled 2022-09-13 (×4): qty 100

## 2022-09-13 MED ORDER — TRAVASOL 10 % IV SOLN
INTRAVENOUS | Status: AC
  Filled 2022-09-13: qty 957.6

## 2022-09-13 MED ORDER — DIPHENHYDRAMINE HCL 50 MG/ML IJ SOLN
12.5000 mg | Freq: Every evening | INTRAMUSCULAR | Status: DC | PRN
  Filled 2022-09-13: qty 1

## 2022-09-13 MED ORDER — ENOXAPARIN SODIUM 40 MG/0.4ML IJ SOSY
40.0000 mg | PREFILLED_SYRINGE | INTRAMUSCULAR | Status: DC
  Administered 2022-09-13 – 2022-09-19 (×7): 40 mg via SUBCUTANEOUS
  Filled 2022-09-13 (×7): qty 0.4

## 2022-09-13 NOTE — Progress Notes (Signed)
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 She was admitted with sigmoid diverticulitis and started on iv antibiotics. General surgery was consulted.  Patient underwent a exploratory laparotomy and colostomy on 08/31/2022. Pt s/p Right TG drain placement 9/14    PT Comments    Pt with new NG tube, denies nausea but is having discomfort at transgluteal drain site and NG tube site. Pt performs supine<>sit through sidelying, needing increased time and use of bedrail, min A to upright trunk into sitting and min A to lift BLE back into bed. Pt powers to stand with CGA for safety x2 reps, tolerates standing ~3 minutes before needing seated rest break. Pt returns to supine at EOS with all needs in reach.   Recommendations for follow up therapy are one component of a multi-disciplinary discharge planning process, led by the attending physician.  Recommendations may be updated based on patient status, additional functional criteria and insurance authorization.  Follow Up Recommendations  Home health PT     Assistance Recommended at Discharge Intermittent Supervision/Assistance  Patient can return home with the following A little help with walking and/or transfers;A little help with bathing/dressing/bathroom;Assistance with cooking/housework;Assist for transportation;Help with stairs or ramp for entrance   Equipment Recommendations  None recommended by PT    Recommendations for Other Services       Precautions / Restrictions Precautions Precautions: Fall Precaution Comments: colostomy, VAC, Rt TG drain, NG tube Restrictions Weight Bearing Restrictions: No      Mobility  Bed Mobility Overal bed mobility: Needs Assistance Bed Mobility: Sidelying to Sit, Sit to Sidelying  Sidelying to sit: Min assist  Sit to sidelying: Min assist General bed mobility comments: min A to upright trunk into sitting from sidelying, min A to lift BLE back into bed    Transfers Overall transfer level: Needs assistance Equipment used: Rolling walker (2 wheels) Transfers: Sit to/from Stand Sit to Stand: Min guard, From elevated surface  General transfer comment: pt powers to stand x2 reps from EOB with BUE assisting, min guard for safety, tolerates static standing for ~3 minutes before needing seated rest break    Ambulation/Gait  General Gait Details: NG tube to suction at wall, also pt fatigues with static standing   Stairs             Wheelchair Mobility    Modified Rankin (Stroke Patients Only)       Balance   Sitting-balance support: Feet supported Sitting balance-Leahy Scale: Good  Standing balance support: Bilateral upper extremity supported Standing balance-Leahy Scale: Fair Standing balance comment: Fair with rolling walker, static standing     Cognition Arousal/Alertness: Awake/alert Behavior During Therapy: WFL for tasks assessed/performed Overall Cognitive Status: Within Functional Limits for tasks assessed     Exercises      General Comments        Pertinent Vitals/Pain Pain Assessment Pain Assessment: Faces Faces Pain Scale: Hurts even more Pain Location: Rt transgluteal drain, NG tube Pain Descriptors / Indicators: Discomfort Pain Intervention(s): Limited activity within patient's tolerance, Monitored during session, Repositioned    Home Living                          Prior Function  PT Goals (current goals can now be found in the care plan section) Acute Rehab PT Goals Patient Stated Goal: Regain IND PT Goal Formulation: With patient Time For Goal Achievement: 09/16/22 Potential to  Achieve Goals: Good Progress towards PT goals: Progressing toward goals    Frequency    Min 3X/week      PT Plan Current plan remains appropriate    Co-evaluation              AM-PAC PT "6 Clicks" Mobility   Outcome Measure  Help needed turning from your back to your side while in a flat bed without using bedrails?: A Little Help needed moving from lying on your back to sitting on the side of a flat bed without using bedrails?: A Little Help needed moving to and from a bed to a chair (including a wheelchair)?: A Lot Help needed standing up from a chair using your arms (e.g., wheelchair or bedside chair)?: A Little Help needed to walk in hospital room?: A Lot Help needed climbing 3-5 steps with a railing? : A Lot 6 Click Score: 15    End of Session   Activity Tolerance: Patient tolerated treatment well Patient left: in bed;with call bell/phone within reach;with bed alarm set Nurse Communication: Mobility status (ice chips) PT Visit Diagnosis: Difficulty in walking, not elsewhere classified (R26.2)     Time: 3875-6433 PT Time Calculation (min) (ACUTE ONLY): 19 min  Charges:  $Therapeutic Activity: 8-22 mins                      Tori Lendon George PT, DPT 09/13/22, 12:29 PM

## 2022-09-13 NOTE — Progress Notes (Signed)
Pt refused CPAP qhs.  Pt has NG tube in now and unable to tolerate the CPAP mask tonight.

## 2022-09-13 NOTE — Progress Notes (Signed)
PROGRESS NOTE  Kristin AskewJeanne M Naramore  DOB: [birth year]  PCP: Woodroe ChenJohns, Terrance, MD ZOX:[id number]  DOA: 08/21/2022  LOS: 22 days  Hospital Day: 24  Brief narrative: Kristin Gibson is a 66 y.o. female with PMH significant for morbid obesity, OSA on CPAP, prediabetes, HTN, GERD, arthritis, back pain who underwent gastric sleeve surgery in February 2023.  She also has recurrent diverticulitis and recently completed 2 rounds of antibiotics. On 8/29, patient presented to ED with progressive abdominal pain, fever, chills. Work-up in the ED notable for recurrent sigmoid diverticulitis.   Patient was started on IV antibiotics.   Initially admitted to the hospital service.  General surgery was consulted.   Care was later switched to general surgery of the primary team Hospitalist service following as consult. See below for details.  Subjective: Patient was seen and examined this morning.  Lying down in bed.  Complains of nausea and vomiting.  Looks like NG tube has been reinserted again by general surgery team.  Remains on TPN.  Assessment and plan: Recurrent sigmoid diverticulitis  s/p Exploratory laparotomy, sigmoid colectomy, descending colostomy, VAC dressing placement for Diverticulitis with Colonic Obstruction by Dr. Gerrit FriendsGerkin on 08/31/22 Presented with abdominal pain, fever, chills. CT scan notable for recurrent sigmoid diverticulitis Patient was started on IV antibiotics despite which symptoms did not improve. 9/8, patient underwent exploratory laparotomy, sigmoid colectomy, descending colostomy with wound VAC placement by Dr. Gerrit FriendsGerkin.  Postop ileus Continues to have postop ileus.  She has ongoing nausea and vomiting.  Repeat abdominal x-ray this a.m. showed unchanged central abdominal small bowel dilatation compared to CT scan from 9/19.  Per general surgery team., colostomy has minimal function.  NG tube was placed today to LIWS. Continue trial of reglan. For nutrition, patient is  currently on TPN since 9/15.  General surgery following. Patient states that she was not able to tolerate clear liquid diet and hence she n.p.o. again. Cont wound vac - next change Friday. Ostomy care nurse following. Continue frequent ambulation.    Postoperative pelvic abscess w/ enterococcus faecium 9/13, repeat CT abdomen/pelvis notable for central pelvic fluid collection measuring 6.3 cm  9/14, status post IR drainage.   9/19, repeat CT scan showed improving size of pelvic collection down to 4.3 x 3.5 cm which is significantly smaller compared to prior exam. 9/21, IR plans to repeat positioned tube for better drainage. Wound culture from 9/14 grew Enterococcus faecium.  Currently on IV vancomycin.  Hypokalemia/hypomagnesemia Improved with replacement Recent Labs  Lab 09/07/22 0416 09/08/22 0331 09/09/22 0553 09/10/22 0511 09/11/22 0402 09/13/22 0411  K 3.8 3.8 3.9 4.1 4.2 4.2  MG 2.0 2.0 1.8 2.0  --  1.8  PHOS  --  4.2 3.6 3.2  --  4.1    Postoperative anemia Baseline hemoglobin normal over 12.  Postprocedure, hemoglobin is running low, lowest of 8.8 on 9/19.  No active blood loss.  Continue to monitor hemoglobin.  Transfuse if less than 7. Recent Labs    09/05/22 0351 09/06/22 0401 09/07/22 0416 09/11/22 0402 09/13/22 0411  HGB 10.6* 11.1* 10.3* 8.8* 9.1*  MCV 89.8 84.8 85.7 88.7 88.7    Essential hypertension Amlodipine 10 mg p.o. daily, Clonidine 0.1 mg p.o. qPM along with hydralazine as needed. Continue to monitor blood pressure.   GERD Protonix 40 mg IV daily  Morbid obesity  Body mass index is 36.48 kg/m. Patient has been advised to make an attempt to improve diet and exercise patterns to aid in weight loss.  Goals of care   Code Status: Full Code    Mobility: Encourage ambulation  Skin assessment:     Nutritional status:  Body mass index is 36.48 kg/m.  Nutrition Problem: Increased nutrient needs Etiology: post-op healing (h/o gastric  sleeve) Signs/Symptoms: estimated needs     Diet:  Diet Order             Diet NPO time specified Except for: Ice Chips, Sips with Meds  Diet effective now                   DVT prophylaxis:  enoxaparin (LOVENOX) injection 40 mg Start: 09/13/22 1015 SCD's Start: 08/30/22 1310   Antimicrobials: IV vancomycin Fluid: Currently on D5 half NS with potassium at 30 mill per hour, TPN Family Communication: None at bedside  Status is: Inpatient  Continue in-hospital care because: Ongoing recovery from sigmoid colectomy Level of care: Med-Surg   Dispo: The patient is from: Home              Anticipated d/c is to: Home              Patient currently is not medically stable to d/c.   Difficult to place patient No     Infusions:   sodium chloride     acetaminophen 1,000 mg (09/13/22 1232)   dextrose 5 % and 0.45 % NaCl with KCl 20 mEq/L 30 mL/hr at 09/13/22 0511   methocarbamol (ROBAXIN) IV 1,000 mg (09/13/22 0919)   ondansetron (ZOFRAN) IV 8 mg (09/13/22 0914)   TPN ADULT (ION) 70 mL/hr at 09/12/22 1800   TPN ADULT (ION)     vancomycin 1,000 mg (09/13/22 0509)    Scheduled Meds:  amLODipine  10 mg Oral Q supper   Chlorhexidine Gluconate Cloth  6 each Topical Daily   cloNIDine  0.1 mg Oral Q supper   enoxaparin (LOVENOX) injection  40 mg Subcutaneous Q24H   gabapentin  300 mg Oral TID   insulin aspart  0-15 Units Subcutaneous Q4H   lip balm   Topical BID   metoCLOPramide (REGLAN) injection  5 mg Intravenous Q8H   pantoprazole (PROTONIX) IV  40 mg Intravenous QHS   sodium chloride flush  10-40 mL Intracatheter Q12H   sodium chloride flush  3 mL Intravenous Q12H   sodium chloride flush  5 mL Intracatheter Q8H   traZODone  50 mg Oral QHS    PRN meds: sodium chloride, alum & mag hydroxide-simeth, diphenhydrAMINE, diphenhydrAMINE, hydrALAZINE, hydrocortisone cream, magic mouthwash, menthol-cetylpyridinium, morphine injection, ondansetron (ZOFRAN) IV **OR**  ondansetron (ZOFRAN) IV, oxyCODONE, phenol, prochlorperazine, sodium chloride flush, sodium chloride flush   Antimicrobials: Anti-infectives (From admission, onward)    Start     Dose/Rate Route Frequency Ordered Stop   09/10/22 0600  vancomycin (VANCOCIN) IVPB 1000 mg/200 mL premix        1,000 mg 200 mL/hr over 60 Minutes Intravenous Every 12 hours 09/09/22 1620     09/09/22 1715  vancomycin (VANCOREADY) IVPB 2000 mg/400 mL        2,000 mg 200 mL/hr over 120 Minutes Intravenous  Once 09/09/22 1620 09/09/22 1955   09/06/22 1400  piperacillin-tazobactam (ZOSYN) IVPB 3.375 g  Status:  Discontinued        3.375 g 12.5 mL/hr over 240 Minutes Intravenous Every 8 hours 09/06/22 1302 09/11/22 0839   09/02/22 1400  ceFEPIme (MAXIPIME) 2 g in sodium chloride 0.9 % 100 mL IVPB        2 g  200 mL/hr over 30 Minutes Intravenous Every 8 hours 09/02/22 1015 09/05/22 0103   08/31/22 0600  cefoTEtan (CEFOTAN) 2 g in sodium chloride 0.9 % 100 mL IVPB  Status:  Discontinued        2 g 200 mL/hr over 30 Minutes Intravenous On call to O.R. 08/30/22 1316 08/31/22 1505   08/26/22 1400  ceFEPIme (MAXIPIME) 2 g in sodium chloride 0.9 % 100 mL IVPB  Status:  Discontinued        2 g 200 mL/hr over 30 Minutes Intravenous Every 8 hours 08/26/22 0848 09/02/22 1015   08/23/22 0400  cefTRIAXone (ROCEPHIN) 2 g in sodium chloride 0.9 % 100 mL IVPB  Status:  Discontinued        2 g 200 mL/hr over 30 Minutes Intravenous Every 24 hours 08/22/22 0345 08/26/22 0848   08/22/22 1400  metroNIDAZOLE (FLAGYL) IVPB 500 mg        500 mg 100 mL/hr over 60 Minutes Intravenous Every 12 hours 08/22/22 0345 09/05/22 0449   08/22/22 0100  cefTRIAXone (ROCEPHIN) 2 g in sodium chloride 0.9 % 100 mL IVPB        2 g 200 mL/hr over 30 Minutes Intravenous  Once 08/22/22 0047 08/22/22 0226   08/22/22 0100  metroNIDAZOLE (FLAGYL) IVPB 500 mg        500 mg 100 mL/hr over 60 Minutes Intravenous  Once 08/22/22 0047 08/22/22 0412        Objective: Vitals:   09/12/22 2034 09/13/22 1220  BP: (!) 166/90 (!) 148/95  Pulse: 82 81  Resp: 17 18  Temp: 99.2 F (37.3 C) 98.7 F (37.1 C)  SpO2: 98% 97%    Intake/Output Summary (Last 24 hours) at 09/13/2022 1354 Last data filed at 09/13/2022 0600 Gross per 24 hour  Intake 1090.88 ml  Output 1300 ml  Net -209.12 ml    Filed Weights   08/21/22 2220 08/31/22 0815  Weight: 112.5 kg 112 kg   Weight change:  Body mass index is 36.48 kg/m.   Physical Exam: General exam: Pleasant, elderly African-American female. Skin: No rashes, lesions or ulcers. HEENT: Atraumatic, normocephalic, no obvious bleeding.  NG tube reinserted again Lungs: Clear to auscultation bilaterally CVS: Regular rate and rhythm, no murmur GI/Abd soft, appropriate postop tenderness, ostomy site intact, bowel sound present CNS: Alert, awake, oriented x3 Psychiatry: Mood appropriate Extremities: No pedal edema, no calf tenderness  Data Review: I have personally reviewed the laboratory data and studies available.  F/u labs ordered Unresulted Labs (From admission, onward)     Start     Ordered   09/10/22 0500  Comprehensive metabolic panel  (TPN Lab Panel)  Every Mon,Thu (0500),   R     Question:  Specimen collection method  Answer:  Lab=Lab collect   09/07/22 1140   09/10/22 0500  Magnesium  (TPN Lab Panel)  Every Mon,Thu (0500),   R     Question:  Specimen collection method  Answer:  Lab=Lab collect   09/07/22 1140   09/10/22 0500  Phosphorus  (TPN Lab Panel)  Every Mon,Thu (0500),   R     Question:  Specimen collection method  Answer:  Lab=Lab collect   09/07/22 1140   09/10/22 0500  Triglycerides  (TPN Lab Panel)  Every Monday (0500),   R     Question:  Specimen collection method  Answer:  Lab=Lab collect   09/07/22 1140            Signed, Melina Schools  Elis Rawlinson, MD [facility] 09/13/2022

## 2022-09-13 NOTE — Progress Notes (Signed)
Referring Physician(s): White,C  Supervising Physician: Malachy Moan  Patient Status:  [facility] - In-pt  Chief Complaint: Abdominal pain, nausea, vomiting ,postop pelvic abscess   Subjective: Pt cont to have some lower abd discomfort ; also with some N/V earlier but none since NG tube placed; no sig pain at TG drain site   Allergies: Benazepril, Sulfa antibiotics, and Elemental sulfur  Medications: Prior to Admission medications   Medication Sig Start Date End Date Taking? Authorizing Provider  acetaminophen (TYLENOL) 500 MG tablet Take 1,000 mg by mouth daily as needed for mild pain. pain    Yes [provider]  amLODipine (NORVASC) 10 MG tablet Take 10 mg by mouth daily with supper. 08/08/17  Yes [provider]  amoxicillin (AMOXIL) 500 MG capsule Take 2,000 mg by mouth once.   Yes [provider]  amoxicillin-clavulanate (AUGMENTIN) 875-125 MG tablet Take 1 tablet by mouth 2 (two) times daily. 08/17/22  Yes [provider]  cloNIDine (CATAPRES) 0.1 MG tablet Take 0.1 mg by mouth daily with supper. 10/13/18  Yes [provider]  dicyclomine (BENTYL) 20 MG tablet Take 20 mg by mouth every 8 (eight) hours. 12/05/16  Yes [provider]  methocarbamol (ROBAXIN) 500 MG tablet Take 500 mg by mouth every 8 (eight) hours as needed for muscle spasms.   Yes [provider]  Multiple Vitamin (MULTIVITAMIN) tablet Take 1 tablet by mouth daily.   Yes [provider]  ondansetron (ZOFRAN-ODT) 4 MG disintegrating tablet Take 1 tablet (4 mg total) by mouth every 6 (six) hours as needed for nausea or vomiting. 02/20/22  Yes Berna Bue, MD  pantoprazole (PROTONIX) 40 MG tablet Take 1 tablet (40 mg total) by mouth daily. 02/20/22  Yes Berna Bue, MD  polyethylene glycol (MIRALAX / GLYCOLAX) 17 g packet Take 17 g by mouth daily as needed (constipation).   Yes [provider]  Probiotic CAPS Take 1 capsule  by mouth daily. 06/28/20  Yes Ward, Layla Maw, DO  tizanidine (ZANAFLEX) 2 MG capsule Take 2 mg by mouth every 8 (eight) hours as needed for muscle spasms.   Yes [provider]  traMADol (ULTRAM) 50 MG tablet Take 1 tablet (50 mg total) by mouth every 6 (six) hours as needed (pain). 02/20/22  Yes Berna Bue, MD  gabapentin (NEURONTIN) 100 MG capsule Take 2 capsules (200 mg total) by mouth every 12 (twelve) hours. Patient not taking: Reported on 08/22/2022 02/20/22   Berna Bue, MD     Vital Signs: BP (!) 166/90 (BP Location: Left Arm)   Pulse 82   Temp 99.2 F (37.3 C) (Oral)   Resp 17   Ht 5\' 9"  (1.753 m)   Wt 247 lb (112 kg)   SpO2 98%   BMI 36.48 kg/m   Physical Exam awake/alert; rt TG drain intact, dressing clean and dry; OP minimal amount of serosang fluid; drain flushed with return of equal amt  Imaging: DG Abd 1 View  Result Date: 09/13/2022 CLINICAL DATA:  NG tube placement EXAM: ABDOMEN - 1 VIEW COMPARISON:  09/13/2022 FINDINGS: NG tube tip is in the distal stomach. Mildly dilated small bowel loops centrally. No confluent opacity in the visualized lower lungs. IMPRESSION: NG tube tip in the distal stomach. Electronically Signed   By: 09/15/2022 M.D.   On: 09/13/2022 10:36   DG Abd Portable 1V  Result Date: 09/13/2022 CLINICAL DATA:  [id number].  Follow-up of ileus. EXAM: PORTABLE ABDOMEN -  1 VIEW COMPARISON:  Similar study of 08/25/2022. CT with IV contrast 09/11/2022 FINDINGS: Small-bowel dilatation, primarily in the central abdomen, shows little if any change with maximal small bowel caliber 4 cm. Left mid abdominal diverting colostomy was better visible on CT with no evidence of colonic obstruction. There is no supine evidence of free air. A drainage catheter is again noted in the pelvis right of midline. No pathologic calcification is evident.  Lung bases are clear. IMPRESSION: Unchanged central abdominal small bowel dilatation. Electronically Signed   By:  Almira Bar M.D.   On: 09/13/2022 05:38   IR Catheter Tube Change  Result Date: 09/12/2022 CLINICAL DATA:  Pelvic abscess status post percutaneous drain catheter placement 09/06/2022. Decreased outputs with residual fluid collection just inferior to the drain catheter loop on recent CT. Catheter revision is requested EXAM: ABSCESS DRAIN CATHETER EXCHANGE AND REPOSITIONING UNDER FLUOROSCOPY TECHNIQUE: The procedure, risks (including but not limited to bleeding, infection, organ damage), benefits, and alternatives were explained to the patient. Questions regarding the procedure were encouraged and answered. The patient understands and consents to the procedure. Survey fluoroscopic inspection reveals stable position of the right transgluteal percutaneous drain catheter. Gentle contrast injection opacifies small collection inferior to the drain catheter. The catheter was cut and exchanged over a stiff Glidewire for short angled Kumpe catheter, used to reposition the guidewire centrally within the collection. The Kumpe catheter was then exchanged for a new 12 French pigtail drain catheter, formed centrally within the residual cavity. Catheter was secured externally with 0 Prolene suture and StatLock and placed to gravity drain bag. The patient tolerated the procedure well. IMPRESSION: 1. Technically successful exchange and repositioning of 12 French pelvic abscess drain catheter as above. Electronically Signed   By: Corlis Leak M.D.   On: 09/12/2022 16:21   CT ABDOMEN PELVIS W CONTRAST  Result Date: 09/11/2022 CLINICAL DATA:  Status post pelvic abscess drainage. EXAM: CT ABDOMEN AND PELVIS WITH CONTRAST TECHNIQUE: Multidetector CT imaging of the abdomen and pelvis was performed using the standard protocol following bolus administration of intravenous contrast. RADIATION DOSE REDUCTION: This exam was performed according to the departmental dose-optimization program which includes automated exposure control,  adjustment of the mA and/or kV according to patient size and/or use of iterative reconstruction technique. CONTRAST:  OMNIPAQUE IOHEXOL 300 MG/ML  SOLN COMPARISON:  September 05, 2022. FINDINGS: Lower chest: Minimal left pleural effusion is noted with adjacent subsegmental atelectasis. Pericardial effusion is noted. Hepatobiliary: No focal liver abnormality is seen. No gallstones, gallbladder wall thickening, or biliary dilatation. Pancreas: Unremarkable. No pancreatic ductal dilatation or surrounding inflammatory changes. Spleen: Normal in size without focal abnormality. Adrenals/Urinary Tract: Adrenal glands are noted. Bilateral renal cysts are noted for which no further follow-up is required. No hydronephrosis or renal obstruction is noted. Mild urinary bladder distention is noted. Stomach/Bowel: Status post gastric bypass. Colostomy is noted in left lower quadrant. Mildly dilated small bowel loops are noted which most likely represent ileus. Vascular/Lymphatic: No significant vascular findings are present. No enlarged abdominal or pelvic lymph nodes. Reproductive: Status post hysterectomy. No adnexal masses. Other: Interval placement of right transgluteal pelvic drainage catheter. Fluid collection measures 4.3 x 3.5 cm which is significantly smaller compared to prior exam. Musculoskeletal: No acute or significant osseous findings. IMPRESSION: Interval placement of right transgluteal pelvic drainage catheter. Pelvic fluid collection now measures 4.3 x 3.5 cm which is significantly smaller compared to prior exam. Small pericardial effusion is noted. Minimal left pleural effusion is noted with adjacent  subsegmental atelectasis. Mild urinary bladder distention is noted. Status post colostomy placement in the left lower quadrant. Electronically Signed   By: Marijo Conception M.D.   On: 09/11/2022 14:33    Labs:  CBC: Recent Labs    09/06/22 0401 09/07/22 0416 09/11/22 0402 09/13/22 0411  WBC 9.7 9.7  8.3 7.5  HGB 11.1* 10.3* 8.8* 9.1*  HCT 33.5* 31.1* 28.3* 28.2*  PLT 367 338 335 362    COAGS: Recent Labs    09/06/22 0401  INR 1.3*    BMP: Recent Labs    09/09/22 0553 09/10/22 0511 09/11/22 0402 09/13/22 0411  NA 135 138 137 136  K 3.9 4.1 4.2 4.2  CL 106 108 107 106  CO2 24 24 24 23   GLUCOSE 113* 99 118* 131*  BUN 12 14 14 14   CALCIUM 8.4* 8.8* 8.8* 8.8*  CREATININE 0.50 0.52 0.68 0.46  GFRNONAA >60 >60 >60 >60    LIVER FUNCTION TESTS: Recent Labs    08/24/22 0419 09/08/22 0331 09/10/22 0511 09/13/22 0411  BILITOT 0.5 0.5 0.5 0.5  AST 14* 17 15 17   ALT 13 10 10 12   ALKPHOS 48 54 59 72  PROT 7.0 5.8* 6.5 6.4*  ALBUMIN 2.8* 2.2* 2.4* 2.3*    Assessment and Plan: Pt with hx sigmoid diverticulitis with prior exploratory laparotomy, sigmoid colectomy, descending colostomy with Dr. Harlow Asa on 08/31/2022; now with postop pelvic abscess, s/p rt TG drain placement 9/14 (12 fr to JP); s/p repositioning of drain on 9/20 to hopefully expedite removal of existing fluid(12 fr to bag); temp 99.2, WBC nl, hgb 9.1, cx- enterococcus, creat nl; cont drain flushes tid, close OP monitoring; other plans as per CCS/TRH   Electronically Signed: D. Rowe Robert, PA-C 09/13/2022, 11:02 AM   I spent a total of 15 Minutes at the the patient's bedside AND on the patient's hospital floor or unit, greater than 50% of which was counseling/coordinating care for pelvic abscess drain    Patient ID: Kristin Gibson, female   DOB: Apr 24, 1956, 66 y.o.   MRN: [id number]

## 2022-09-13 NOTE — Progress Notes (Signed)
Pharmacy Antibiotic Note  Kristin Gibson is a 66 y.o. female admitted on 08/21/2022 with  pelvic abscess .  Pharmacy has been consulted for Vanco dosing.  ID: Post-op pelvic abscess. -S/p drain placement 9/14 -Reposition drain 9/20 for persistent fluid collection  Cefepime 9/3>>9/12 Flagyl 8/30>9/14 Rocephin 8/30>9/3 Zosyn 9/14>> 9/19 Vancomycin 9/17 >>   9/14 abdominal abscess: E. Faecium ( R to ampicillin, S to vancomycin )   Plan: Vanco 1000 mg IV q12h ( AUC 555 with SCr rounded up 0.8)  MD note says 5d abx. F/u stop date today    Height: 5\' 9"  (175.3 cm) Weight: 112 kg (247 lb) IBW/kg (Calculated) : 66.2  Temp (24hrs), Avg:99 F (37.2 C), Min:98.8 F (37.1 C), Max:99.2 F (37.3 C)  Recent Labs  Lab 09/07/22 0416 09/08/22 0331 09/09/22 0553 09/10/22 0511 09/11/22 0402 09/13/22 0411  WBC 9.7  --   --   --  8.3 7.5  CREATININE 0.61 0.70 0.50 0.52 0.68 0.46    Estimated Creatinine Clearance: 92.3 mL/min (by C-G formula based on SCr of 0.46 mg/dL).    Allergies  Allergen Reactions   Benazepril Swelling   Sulfa Antibiotics Nausea Only   Elemental Sulfur Nausea Only    Wilsie Kern S. Alford Highland, PharmD, BCPS Clinical Staff Pharmacist [email or web address]   Wayland Salinas 09/13/2022 8:32 AM

## 2022-09-13 NOTE — TOC Progression Note (Signed)
Transition of Care [facility]) - Progression Note    Patient Details  Name: Kristin Gibson MRN: [id number] Date of Birth: 1956/05/12  Transition of Care [facility]) CM/SW [place], RN Phone Number: 09/13/2022, 11:00 AM  Clinical Narrative:   No additional TOC needs at this time.   Patient is a new ostomy, HHRN, HHPT/OT coordinated with [place] at [place].Notified Danielle with [facility] to follow for discharge for NPWT, as [facility] is not in network.    TOC will continue follow.    Expected Discharge Plan: [place] Barriers to Discharge: Continued Medical Work up  Expected Discharge Plan and Services Expected Discharge Plan: [place] In-house Referral: NA Discharge Planning Services: CM Consult Post Acute Care Choice: [facility]) Living arrangements for the past 2 months: Apartment                 DME Arranged: Other see comment, Negative pressure wound device (raised commode seat) DME Agency: [facility], [facility] Date DME Agency Contacted: 09/03/22 Time DME Agency Contacted: [phone] Representative spoke with at DME Agency: [place]: OT, RN, PT [facility] Agency: [place] Date [place]: 09/03/22 Time [facility]: [id number] Representative spoke with at [place]: [place] ([place]) Interventions    Readmission Risk Interventions    09/03/2022    3:44 PM  Readmission Risk Prevention Plan  PCP or Specialist Appt within 5-7 Days Complete  Home Care Screening Complete  Medication Review (RN CM) Complete

## 2022-09-13 NOTE — Progress Notes (Signed)
13 Days Post-Op  Subjective: CC: Patient reports pain only at her incision.  No new or worsening abdominal pain.  Has been dealing with ongoing nausea as well as vomiting.  She reports nausea worsens shortly after p.o. intake and usually vomits 15 minutes after solid p.o. intake.  She reports she is tolerating clears without vomiting.  Also notes that fentanyl seems to make her nauseous that she has been taking Zofran with this whenever she needs it.  Having ostomy output. Voiding since foley removal. Reports she mobilized in the halls yesterday.   Objective: Vital signs in last 24 hours: Temp:  [98.8 F (37.1 C)-99.2 F (37.3 C)] 99.2 F (37.3 C) (09/20 2034) Pulse Rate:  [82-86] 82 (09/20 2034) Resp:  [17-18] 17 (09/20 2034) BP: (137-166)/(83-90) 166/90 (09/20 2034) SpO2:  [96 %-98 %] 98 % (09/20 2034) Last BM Date : 09/12/22  Intake/Output from previous day: 09/20 0701 - 09/21 0700 In: 4205.3 [I.V.:2586.5; IV Piggyback:1618.8] Out: 1350 [Urine:1300; Stool:50] Intake/Output this shift: No intake/output data recorded.  PE: Gen:  Alert, NAD, pleasant Card:  Reg Pulm:  CTAB, no W/R/R, effort normal Abd: Soft, no significant distention, appropriately tender around her incisions but otherwise nontender. No rigidity or guarding. Some BS. Midline wound with vac in place with good seal, scant output in vac cannister. JP drain with no output in bulb. Ext:  No LE edema  Psych: A&Ox3   Lab Results:  Recent Labs    09/11/22 0402 09/13/22 0411  WBC 8.3 7.5  HGB 8.8* 9.1*  HCT 28.3* 28.2*  PLT 335 362   BMET Recent Labs    09/11/22 0402 09/13/22 0411  NA 137 136  K 4.2 4.2  CL 107 106  CO2 24 23  GLUCOSE 118* 131*  BUN 14 14  CREATININE 0.68 0.46  CALCIUM 8.8* 8.8*   PT/INR No results for input(s): "LABPROT", "INR" in the last 72 hours. CMP     Component Value Date/Time   NA 136 09/13/2022 0411   K 4.2 09/13/2022 0411   CL 106 09/13/2022 0411   CO2 23  09/13/2022 0411   GLUCOSE 131 (H) 09/13/2022 0411   BUN 14 09/13/2022 0411   CREATININE 0.46 09/13/2022 0411   CALCIUM 8.8 (L) 09/13/2022 0411   PROT 6.4 (L) 09/13/2022 0411   ALBUMIN 2.3 (L) 09/13/2022 0411   AST 17 09/13/2022 0411   ALT 12 09/13/2022 0411   ALKPHOS 72 09/13/2022 0411   BILITOT 0.5 09/13/2022 0411   GFRNONAA >60 09/13/2022 0411   GFRAA >60 06/28/2020 0548   Lipase     Component Value Date/Time   LIPASE 26 08/21/2022 2306    Studies/Results: DG Abd Portable 1V  Result Date: 09/13/2022 CLINICAL DATA:  30865.  Follow-up of ileus. EXAM: PORTABLE ABDOMEN - 1 VIEW COMPARISON:  Similar study of 08/25/2022. CT with IV contrast 09/11/2022 FINDINGS: Small-bowel dilatation, primarily in the central abdomen, shows little if any change with maximal small bowel caliber 4 cm. Left mid abdominal diverting colostomy was better visible on CT with no evidence of colonic obstruction. There is no supine evidence of free air. A drainage catheter is again noted in the pelvis right of midline. No pathologic calcification is evident.  Lung bases are clear. IMPRESSION: Unchanged central abdominal small bowel dilatation. Electronically Signed   By: Telford Nab M.D.   On: 09/13/2022 05:38   IR Catheter Tube Change  Result Date: 09/12/2022 CLINICAL DATA:  Pelvic abscess status post percutaneous drain  catheter placement 09/06/2022. Decreased outputs with residual fluid collection just inferior to the drain catheter loop on recent CT. Catheter revision is requested EXAM: ABSCESS DRAIN CATHETER EXCHANGE AND REPOSITIONING UNDER FLUOROSCOPY TECHNIQUE: The procedure, risks (including but not limited to bleeding, infection, organ damage), benefits, and alternatives were explained to the patient. Questions regarding the procedure were encouraged and answered. The patient understands and consents to the procedure. Survey fluoroscopic inspection reveals stable position of the right transgluteal percutaneous  drain catheter. Gentle contrast injection opacifies small collection inferior to the drain catheter. The catheter was cut and exchanged over a stiff Glidewire for short angled Kumpe catheter, used to reposition the guidewire centrally within the collection. The Kumpe catheter was then exchanged for a new 12 French pigtail drain catheter, formed centrally within the residual cavity. Catheter was secured externally with 0 Prolene suture and StatLock and placed to gravity drain bag. The patient tolerated the procedure well. IMPRESSION: 1. Technically successful exchange and repositioning of 12 French pelvic abscess drain catheter as above. Electronically Signed   By: Corlis Leak M.D.   On: 09/12/2022 16:21   CT ABDOMEN PELVIS W CONTRAST  Result Date: 09/11/2022 CLINICAL DATA:  Status post pelvic abscess drainage. EXAM: CT ABDOMEN AND PELVIS WITH CONTRAST TECHNIQUE: Multidetector CT imaging of the abdomen and pelvis was performed using the standard protocol following bolus administration of intravenous contrast. RADIATION DOSE REDUCTION: This exam was performed according to the departmental dose-optimization program which includes automated exposure control, adjustment of the mA and/or kV according to patient size and/or use of iterative reconstruction technique. CONTRAST:  OMNIPAQUE IOHEXOL 300 MG/ML  SOLN COMPARISON:  September 05, 2022. FINDINGS: Lower chest: Minimal left pleural effusion is noted with adjacent subsegmental atelectasis. Pericardial effusion is noted. Hepatobiliary: No focal liver abnormality is seen. No gallstones, gallbladder wall thickening, or biliary dilatation. Pancreas: Unremarkable. No pancreatic ductal dilatation or surrounding inflammatory changes. Spleen: Normal in size without focal abnormality. Adrenals/Urinary Tract: Adrenal glands are noted. Bilateral renal cysts are noted for which no further follow-up is required. No hydronephrosis or renal obstruction is noted. Mild urinary  bladder distention is noted. Stomach/Bowel: Status post gastric bypass. Colostomy is noted in left lower quadrant. Mildly dilated small bowel loops are noted which most likely represent ileus. Vascular/Lymphatic: No significant vascular findings are present. No enlarged abdominal or pelvic lymph nodes. Reproductive: Status post hysterectomy. No adnexal masses. Other: Interval placement of right transgluteal pelvic drainage catheter. Fluid collection measures 4.3 x 3.5 cm which is significantly smaller compared to prior exam. Musculoskeletal: No acute or significant osseous findings. IMPRESSION: Interval placement of right transgluteal pelvic drainage catheter. Pelvic fluid collection now measures 4.3 x 3.5 cm which is significantly smaller compared to prior exam. Small pericardial effusion is noted. Minimal left pleural effusion is noted with adjacent subsegmental atelectasis. Mild urinary bladder distention is noted. Status post colostomy placement in the left lower quadrant. Electronically Signed   By: Lupita Raider M.D.   On: 09/11/2022 14:33    Anti-infectives: Anti-infectives (From admission, onward)    Start     Dose/Rate Route Frequency Ordered Stop   09/10/22 0600  vancomycin (VANCOCIN) IVPB 1000 mg/200 mL premix        1,000 mg 200 mL/hr over 60 Minutes Intravenous Every 12 hours 09/09/22 1620     09/09/22 1715  vancomycin (VANCOREADY) IVPB 2000 mg/400 mL        2,000 mg 200 mL/hr over 120 Minutes Intravenous  Once 09/09/22 1620 09/09/22 1955  09/06/22 1400  piperacillin-tazobactam (ZOSYN) IVPB 3.375 g  Status:  Discontinued        3.375 g 12.5 mL/hr over 240 Minutes Intravenous Every 8 hours 09/06/22 1302 09/11/22 0839   09/02/22 1400  ceFEPIme (MAXIPIME) 2 g in sodium chloride 0.9 % 100 mL IVPB        2 g 200 mL/hr over 30 Minutes Intravenous Every 8 hours 09/02/22 1015 09/05/22 0103   08/31/22 0600  cefoTEtan (CEFOTAN) 2 g in sodium chloride 0.9 % 100 mL IVPB  Status:  Discontinued         2 g 200 mL/hr over 30 Minutes Intravenous On call to O.R. 08/30/22 1316 08/31/22 1505   08/26/22 1400  ceFEPIme (MAXIPIME) 2 g in sodium chloride 0.9 % 100 mL IVPB  Status:  Discontinued        2 g 200 mL/hr over 30 Minutes Intravenous Every 8 hours 08/26/22 0848 09/02/22 1015   08/23/22 0400  cefTRIAXone (ROCEPHIN) 2 g in sodium chloride 0.9 % 100 mL IVPB  Status:  Discontinued        2 g 200 mL/hr over 30 Minutes Intravenous Every 24 hours 08/22/22 0345 08/26/22 0848   08/22/22 1400  metroNIDAZOLE (FLAGYL) IVPB 500 mg        500 mg 100 mL/hr over 60 Minutes Intravenous Every 12 hours 08/22/22 0345 09/05/22 0449   08/22/22 0100  cefTRIAXone (ROCEPHIN) 2 g in sodium chloride 0.9 % 100 mL IVPB        2 g 200 mL/hr over 30 Minutes Intravenous  Once 08/22/22 0047 08/22/22 0226   08/22/22 0100  metroNIDAZOLE (FLAGYL) IVPB 500 mg        500 mg 100 mL/hr over 60 Minutes Intravenous  Once 08/22/22 0047 08/22/22 0412        Assessment/Plan POD 13 s/p Exploratory laparotomy, sigmoid colectomy, descending colostomy, VAC dressing placement for Diverticulitis with Colonic Obstruction by Dr. Gerrit Friends on 08/31/22 - Path benign  - CT 9/13 w/ central pelvic fluid collection measuring 6.3 x 4.6cm. S/p IR drainage 9/14, Cx- enterococcus faecium only sensitive to Vanc. Changed to Vanc 9/18, plan 5d course. Repeat CT 9/19 w/ decrease in size of pelvic fluid collection now measuring 4.3 x 3.5cm. Drain with minimal output. IR planning to reposition today. - Patient with ongoing nausea and vomiting. Her CT showed some dilated small bowel and her KUB looks similar today - dilated loops in LUQ. colostomy has minimal function. Place NG tube today to LIWS. Continue trial of reglan. - Continue TPN until PO intake improves - Cont wound vac - next change Friday, will check ostomy at this time. - WOCN following for new ostomy education. Monitor stoma.  As noted by my colleague there was some necrosis but she was  able to be digitized on 9/18 and the stoma appeared patent - decreasing ostomy output. - Mobilize, continue therapies. Currently recommended for [facility] PT/OT.  - TOC consult for [facility] PT/OT/RN - Daily labs   FEN - NPO, TPN, NG to LIWS. May have some Ice chips/sips for comfort VTE - SCDs, restart Lovenox  ID - Vanc 9/18 >>    ABL anemia - stable Hx Gastric Sleeve Feb 2023  HTN GERD   LOS: 22 days    Adam Phenix , [facility] Surgery 09/13/2022, 9:19 AM Please see Amion for pager number during day hours 7:00am-4:30pm

## 2022-09-13 NOTE — Progress Notes (Signed)
PHARMACY - TOTAL PARENTERAL NUTRITION CONSULT NOTE   Indication: Prolonged ileus  Patient Measurements: Height: 5\' 9"  (175.3 cm) Weight: 112 kg (247 lb) IBW/kg (Calculated) : 66.2 TPN AdjBW (KG): 77.7 Body mass index is 36.48 kg/m.  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  9/8: Exploratory laparotomy, sigmoid colectomy, descending colostomy, VAC dressing placement for Diverticulitis with Colonic Obstruction  -9/13: CT 9/13 w/ central pelvic fluid collection measuring 6.3cm -9/14: IR drainage of fluid collection (enterococcus faecium)  -9/15: Ongoing nausea and some belching, no vomiting. No gas/stool in ostomy pouch. See RD note 9/14. Con't NPO and start TPN for prolonged ileus.  GI: ongoing N/V not tolerating solid food, but ok with CLD - Meds: IV Reglan added, IV PPI/hs  Glucose / Insulin: pre-DM with A1C 6 (01/2022).  - CBGs <180 - 5 units of insulin used in last 24 hours  Electrolytes: WNL (goal K>4 and Mg>2 with ileus) Renal: Scr <1 Hepatic: albumin 2.2, LFTs WNL  Intake / Output; MIVF:  - D5 1/2 NS 20 KCl at 30 ml/hr GI Imaging: - 9/13: CT 9/13 w/ central pelvic fluid collection measuring 6.3cm - 9/19: CT: improving size of pelvic collection   GI Surgeries / Procedures:  - 9/8: Exploratory laparotomy, sigmoid colectomy, descending colostomy, VAC dressing placement for Diverticulitis with Colonic Obstruction  - 9/14: IR drainage of fluid collection/abscess  Central access: PICC TPN start date: 09/06/22  Nutritional Goals: Goal TPN rate is 70 mL/hr (provides 95 g of protein and 1774 kcals per day)  RD Assessment: Estimated  Needs Total Energy Estimated Needs: 1700-1900 Total Protein Estimated Needs: 95-105g Total Fluid Estimated Needs: 1.9L/day  Current Nutrition: see RD note for timeline changes 9/15: TPN 9/20: TPN + back down to CLD   Plan:  Continue TPN at goal rate 70 mL/hr CLD Electrolytes in TPN:  Na 50 mEq/L K 50 mEq/L Ca 5 mEq/L  Mg 10 mEq/L incr Phos 10 mmol/L  Cl:Ac 1:1 (standard) Add standard MVI and trace elements to TPN Continue Moderate SSI q4h and adjust as needed  Continue MIVF at 30 mL/hr per primary MD Monitor TPN labs on Mon/Thurs, and PRN  Sydne Krahl S. Alford Highland, PharmD, BCPS Clinical Staff Pharmacist [email or web address]   Wayland Salinas, PharmD, BCPS Clinical Pharmacist 09/13/2022 8:16 AM

## 2022-09-14 DIAGNOSIS — K5732 Diverticulitis of large intestine without perforation or abscess without bleeding: Secondary | ICD-10-CM | POA: Diagnosis not present

## 2022-09-14 LAB — GLUCOSE, CAPILLARY
Glucose-Capillary: 101 mg/dL — ABNORMAL HIGH (ref 70–99)
Glucose-Capillary: 103 mg/dL — ABNORMAL HIGH (ref 70–99)
Glucose-Capillary: 111 mg/dL — ABNORMAL HIGH (ref 70–99)
Glucose-Capillary: 114 mg/dL — ABNORMAL HIGH (ref 70–99)
Glucose-Capillary: 119 mg/dL — ABNORMAL HIGH (ref 70–99)
Glucose-Capillary: 122 mg/dL — ABNORMAL HIGH (ref 70–99)
Glucose-Capillary: 122 mg/dL — ABNORMAL HIGH (ref 70–99)

## 2022-09-14 MED ORDER — TRAVASOL 10 % IV SOLN
INTRAVENOUS | Status: AC
  Filled 2022-09-14: qty 957.6

## 2022-09-14 MED ORDER — INSULIN ASPART 100 UNIT/ML IJ SOLN
0.0000 [IU] | Freq: Four times a day (QID) | INTRAMUSCULAR | Status: DC
  Administered 2022-09-15: 2 [IU] via SUBCUTANEOUS

## 2022-09-14 NOTE — Progress Notes (Signed)
PROGRESS NOTE  Kristin Gibson  DOB: January 19, 1956  PCP: Marjory Sneddon, MD IRC:[id number]  DOA: 08/21/2022  LOS: 68 days  Hospital Day: 25  Brief narrative: Kristin Gibson is a 66 y.o. female with PMH significant for morbid obesity, OSA on CPAP, prediabetes, HTN, GERD, arthritis, back pain who underwent gastric sleeve surgery in February 2023.  She also has recurrent diverticulitis and recently completed 2 rounds of antibiotics. On 8/29, patient presented to ED with progressive abdominal pain, fever, chills. Work-up in the ED notable for recurrent sigmoid diverticulitis.   Patient was started on IV antibiotics.   Initially admitted to the hospital service.  General surgery was consulted.   Care was later switched to general surgery of the primary team Hospitalist service following as consult. See below for details.  Subjective: Patient was seen and examined this morning.  Lying down in bed.  Has NG tube in place.  General surgery team was dressing her ostomy and wound VAC. Her other medical issues are stable for last 3 days.  Assessment and plan: Recurrent sigmoid diverticulitis  s/p Exploratory laparotomy, sigmoid colectomy, descending colostomy, VAC dressing placement for Diverticulitis with Colonic Obstruction by Dr. Harlow Asa on 08/31/22 Presented with abdominal pain, fever, chills. CT scan notable for recurrent sigmoid diverticulitis Patient was started on IV antibiotics despite which symptoms did not improve. 9/8, patient underwent exploratory laparotomy, sigmoid colectomy, descending colostomy with wound VAC placement by Dr. Harlow Asa.  Postop ileus Continues to have postop ileus.  She has ongoing nausea and vomiting.  Repeat abdominal x-ray this a.m. showed unchanged central abdominal small bowel dilatation compared to CT scan from 9/19.  Per general surgery team., colostomy has minimal function.  NG tube was placed today to LIWS. Continue trial of reglan. For nutrition,  patient is currently on TPN since 9/15.  General surgery following. Patient states that she was not able to tolerate clear liquid diet and hence she n.p.o. again. Cont wound vac - next change Friday. Ostomy care nurse following. Continue frequent ambulation.    Postoperative pelvic abscess w/ enterococcus faecium 9/13, repeat CT abdomen/pelvis notable for central pelvic fluid collection measuring 6.3 cm  9/14, status post IR drainage.   9/19, repeat CT scan showed improving size of pelvic collection down to 4.3 x 3.5 cm which is significantly smaller compared to prior exam. 9/21, IR plans to repeat positioned tube for better drainage. Wound culture from 9/14 grew Enterococcus faecium.  Currently on IV vancomycin.  Hypokalemia/hypomagnesemia Improved with replacement Recent Labs  Lab 09/08/22 0331 09/09/22 0553 09/10/22 0511 09/11/22 0402 09/13/22 0411  K 3.8 3.9 4.1 4.2 4.2  MG 2.0 1.8 2.0  --  1.8  PHOS 4.2 3.6 3.2  --  4.1    Postoperative anemia Baseline hemoglobin normal over 12.  Postprocedure, hemoglobin is running low, lowest of 8.8 on 9/19.  No active blood loss.  Continue to monitor hemoglobin.  Transfuse if less than 7. Recent Labs    09/05/22 0351 09/06/22 0401 09/07/22 0416 09/11/22 0402 09/13/22 0411  HGB 10.6* 11.1* 10.3* 8.8* 9.1*  MCV 89.8 84.8 85.7 88.7 88.7    Essential hypertension Amlodipine 10 mg p.o. daily, Clonidine 0.1 mg p.o. qPM along with hydralazine as needed. Continue to monitor blood pressure.   GERD Protonix 40 mg IV daily  Morbid obesity  Body mass index is 36.48 kg/m. Patient has been advised to make an attempt to improve diet and exercise patterns to aid in weight loss.  We will sign off  at this time   Goals of care   Code Status: Full Code    Mobility: Encourage ambulation  Skin assessment:     Nutritional status:  Body mass index is 36.48 kg/m.  Nutrition Problem: Increased nutrient needs Etiology: post-op healing  (h/o gastric sleeve) Signs/Symptoms: estimated needs     Diet:  Diet Order             Diet NPO time specified Except for: Ice Chips, Sips with Meds  Diet effective now                   DVT prophylaxis:  enoxaparin (LOVENOX) injection 40 mg Start: 09/13/22 1015 SCD's Start: 08/30/22 1310   Antimicrobials: IV vancomycin Fluid: Currently on D5 half NS with potassium at 30 mill per hour, TPN Family Communication: None at bedside  Status is: Inpatient  Continue in-hospital care because: Ongoing recovery from sigmoid colectomy Level of care: Med-Surg   Dispo: The patient is from: Home              Anticipated d/c is to: Home              Patient currently is not medically stable to d/c.   Difficult to place patient No     Infusions:   sodium chloride     dextrose 5 % and 0.45 % NaCl with KCl 20 mEq/L 30 mL/hr at 09/14/22 1317   methocarbamol (ROBAXIN) IV 1,000 mg (09/14/22 0929)   ondansetron (ZOFRAN) IV 8 mg (09/13/22 0914)   TPN ADULT (ION) 70 mL/hr at 09/13/22 1723   TPN ADULT (ION)     vancomycin 1,000 mg (09/14/22 0523)    Scheduled Meds:  amLODipine  10 mg Oral Q supper   Chlorhexidine Gluconate Cloth  6 each Topical Daily   cloNIDine  0.1 mg Oral Q supper   enoxaparin (LOVENOX) injection  40 mg Subcutaneous Q24H   gabapentin  300 mg Oral TID   insulin aspart  0-15 Units Subcutaneous Q6H   lip balm   Topical BID   metoCLOPramide (REGLAN) injection  5 mg Intravenous Q8H   pantoprazole (PROTONIX) IV  40 mg Intravenous QHS   sodium chloride flush  10-40 mL Intracatheter Q12H   sodium chloride flush  3 mL Intravenous Q12H   sodium chloride flush  5 mL Intracatheter Q8H   traZODone  50 mg Oral QHS    PRN meds: sodium chloride, alum & mag hydroxide-simeth, diphenhydrAMINE, diphenhydrAMINE, hydrALAZINE, hydrocortisone cream, magic mouthwash, menthol-cetylpyridinium, morphine injection, ondansetron (ZOFRAN) IV **OR** ondansetron (ZOFRAN) IV, oxyCODONE,  phenol, prochlorperazine, sodium chloride flush, sodium chloride flush   Antimicrobials: Anti-infectives (From admission, onward)    Start     Dose/Rate Route Frequency Ordered Stop   09/10/22 0600  vancomycin (VANCOCIN) IVPB 1000 mg/200 mL premix        1,000 mg 200 mL/hr over 60 Minutes Intravenous Every 12 hours 09/09/22 1620 09/14/22 2359   09/09/22 1715  vancomycin (VANCOREADY) IVPB 2000 mg/400 mL        2,000 mg 200 mL/hr over 120 Minutes Intravenous  Once 09/09/22 1620 09/09/22 1955   09/06/22 1400  piperacillin-tazobactam (ZOSYN) IVPB 3.375 g  Status:  Discontinued        3.375 g 12.5 mL/hr over 240 Minutes Intravenous Every 8 hours 09/06/22 1302 09/11/22 0839   09/02/22 1400  ceFEPIme (MAXIPIME) 2 g in sodium chloride 0.9 % 100 mL IVPB        2 g 200 mL/hr  over 30 Minutes Intravenous Every 8 hours 09/02/22 1015 09/05/22 0103   08/31/22 0600  cefoTEtan (CEFOTAN) 2 g in sodium chloride 0.9 % 100 mL IVPB  Status:  Discontinued        2 g 200 mL/hr over 30 Minutes Intravenous On call to O.R. 08/30/22 1316 08/31/22 1505   08/26/22 1400  ceFEPIme (MAXIPIME) 2 g in sodium chloride 0.9 % 100 mL IVPB  Status:  Discontinued        2 g 200 mL/hr over 30 Minutes Intravenous Every 8 hours 08/26/22 0848 09/02/22 1015   08/23/22 0400  cefTRIAXone (ROCEPHIN) 2 g in sodium chloride 0.9 % 100 mL IVPB  Status:  Discontinued        2 g 200 mL/hr over 30 Minutes Intravenous Every 24 hours 08/22/22 0345 08/26/22 0848   08/22/22 1400  metroNIDAZOLE (FLAGYL) IVPB 500 mg        500 mg 100 mL/hr over 60 Minutes Intravenous Every 12 hours 08/22/22 0345 09/05/22 0449   08/22/22 0100  cefTRIAXone (ROCEPHIN) 2 g in sodium chloride 0.9 % 100 mL IVPB        2 g 200 mL/hr over 30 Minutes Intravenous  Once 08/22/22 0047 08/22/22 0226   08/22/22 0100  metroNIDAZOLE (FLAGYL) IVPB 500 mg        500 mg 100 mL/hr over 60 Minutes Intravenous  Once 08/22/22 0047 08/22/22 0412       Objective: Vitals:    09/14/22 0557 09/14/22 1207  BP: 134/85 (!) 145/87  Pulse: 81 81  Resp: 18 18  Temp: 99.1 F (37.3 C) 98.1 F (36.7 C)  SpO2: 99% 98%    Intake/Output Summary (Last 24 hours) at 09/14/2022 1451 Last data filed at 09/14/2022 1444 Gross per 24 hour  Intake 3281.31 ml  Output 3550 ml  Net -268.69 ml    Filed Weights   08/21/22 2220 08/31/22 0815  Weight: 112.5 kg 112 kg   Weight change:  Body mass index is 36.48 kg/m.   Physical Exam: General exam: Pleasant, elderly African-American female. Skin: No rashes, lesions or ulcers. HEENT: Atraumatic, normocephalic, no obvious bleeding.  NG tube reinserted again Lungs: Clear to auscultation bilaterally CVS: Regular rate and rhythm, no murmur GI/Abd soft, appropriate postop tenderness, ostomy site intact, bowel sound present CNS: Alert, awake, oriented x3 Psychiatry: Mood appropriate Extremities: No pedal edema, no calf tenderness  Data Review: I have personally reviewed the laboratory data and studies available.  F/u labs ordered Unresulted Labs (From admission, onward)     Start     Ordered   09/15/22 0500  Basic metabolic panel  Tomorrow morning,   R       Question:  Specimen collection method  Answer:  IV Team=IV Team collect   09/14/22 0720   09/15/22 0500  Magnesium  Tomorrow morning,   R       Question:  Specimen collection method  Answer:  IV Team=IV Team collect   09/14/22 0720   09/10/22 0500  Comprehensive metabolic panel  (TPN Lab Panel)  Every Mon,Thu (0500),   R     Question:  Specimen collection method  Answer:  Lab=Lab collect   09/07/22 1140   09/10/22 0500  Magnesium  (TPN Lab Panel)  Every Mon,Thu (0500),   R     Question:  Specimen collection method  Answer:  Lab=Lab collect   09/07/22 1140   09/10/22 0500  Phosphorus  (TPN Lab Panel)  Every Mon,Thu (0500),  R     Question:  Specimen collection method  Answer:  Lab=Lab collect   09/07/22 1140   09/10/22 0500  Triglycerides  (TPN Lab Panel)  Every  Monday (0500),   R     Question:  Specimen collection method  Answer:  Lab=Lab collect   09/07/22 1140            Signed, Lorin GlassBinaya Deserie Dirks, MD [facility] 09/14/2022

## 2022-09-14 NOTE — Progress Notes (Signed)
Referring Physician(s): White,C  Supervising Physician: Arne Cleveland  Patient Status:  [facility] - In-pt  Chief Complaint:  Abdominal pain, postop pelvic abscess  Subjective: Pt without new c/o; reports decreased lower abd discomfort, no N/V   Allergies: Benazepril, Sulfa antibiotics, and Elemental sulfur  Medications: Prior to Admission medications   Medication Sig Start Date End Date Taking? Authorizing Provider  acetaminophen (TYLENOL) 500 MG tablet Take 1,000 mg by mouth daily as needed for mild pain. pain    Yes [provider]  amLODipine (NORVASC) 10 MG tablet Take 10 mg by mouth daily with supper. 08/08/17  Yes [provider]  amoxicillin (AMOXIL) 500 MG capsule Take 2,000 mg by mouth once.   Yes [provider]  amoxicillin-clavulanate (AUGMENTIN) 875-125 MG tablet Take 1 tablet by mouth 2 (two) times daily. 08/17/22  Yes [provider]  cloNIDine (CATAPRES) 0.1 MG tablet Take 0.1 mg by mouth daily with supper. 10/13/18  Yes [provider]  dicyclomine (BENTYL) 20 MG tablet Take 20 mg by mouth every 8 (eight) hours. 12/05/16  Yes [provider]  methocarbamol (ROBAXIN) 500 MG tablet Take 500 mg by mouth every 8 (eight) hours as needed for muscle spasms.   Yes [provider]  Multiple Vitamin (MULTIVITAMIN) tablet Take 1 tablet by mouth daily.   Yes [provider]  ondansetron (ZOFRAN-ODT) 4 MG disintegrating tablet Take 1 tablet (4 mg total) by mouth every 6 (six) hours as needed for nausea or vomiting. 02/20/22  Yes Clovis Riley, MD  pantoprazole (PROTONIX) 40 MG tablet Take 1 tablet (40 mg total) by mouth daily. 02/20/22  Yes Clovis Riley, MD  polyethylene glycol (MIRALAX / GLYCOLAX) 17 g packet Take 17 g by mouth daily as needed (constipation).   Yes [provider]  Probiotic CAPS Take 1 capsule by mouth daily. 06/28/20  Yes Ward, Delice Bison, DO  tizanidine (ZANAFLEX) 2 MG  capsule Take 2 mg by mouth every 8 (eight) hours as needed for muscle spasms.   Yes [provider]  traMADol (ULTRAM) 50 MG tablet Take 1 tablet (50 mg total) by mouth every 6 (six) hours as needed (pain). 02/20/22  Yes Clovis Riley, MD  gabapentin (NEURONTIN) 100 MG capsule Take 2 capsules (200 mg total) by mouth every 12 (twelve) hours. Patient not taking: Reported on 08/22/2022 02/20/22   Clovis Riley, MD     Vital Signs: BP 134/85 (BP Location: Left Arm)   Pulse 81   Temp 99.1 F (37.3 C) (Oral)   Resp 18   Ht 5\' 9"  (1.753 m)   Wt 247 lb (112 kg)   SpO2 99%   BMI 36.48 kg/m   Physical Exam awake/alert; rt TG drain intact, dressing clean and dry; OP minimal amount of serosang fluid  Imaging: DG Abd 1 View  Result Date: 09/13/2022 CLINICAL DATA:  NG tube placement EXAM: ABDOMEN - 1 VIEW COMPARISON:  09/13/2022 FINDINGS: NG tube tip is in the distal stomach. Mildly dilated small bowel loops centrally. No confluent opacity in the visualized lower lungs. IMPRESSION: NG tube tip in the distal stomach. Electronically Signed   By: Rolm Baptise M.D.   On: 09/13/2022 10:36   DG Abd Portable 1V  Result Date: 09/13/2022 CLINICAL DATA:  98749.  Follow-up of ileus. EXAM: PORTABLE ABDOMEN - 1 VIEW COMPARISON:  Similar study of 08/25/2022. CT with IV contrast 09/11/2022 FINDINGS: Small-bowel dilatation, primarily in the central abdomen, shows little if any change  with maximal small bowel caliber 4 cm. Left mid abdominal diverting colostomy was better visible on CT with no evidence of colonic obstruction. There is no supine evidence of free air. A drainage catheter is again noted in the pelvis right of midline. No pathologic calcification is evident.  Lung bases are clear. IMPRESSION: Unchanged central abdominal small bowel dilatation. Electronically Signed   By: Telford Nab M.D.   On: 09/13/2022 05:38   IR Catheter Tube Change  Result Date: 09/12/2022 CLINICAL DATA:  Pelvic  abscess status post percutaneous drain catheter placement 09/06/2022. Decreased outputs with residual fluid collection just inferior to the drain catheter loop on recent CT. Catheter revision is requested EXAM: ABSCESS DRAIN CATHETER EXCHANGE AND REPOSITIONING UNDER FLUOROSCOPY TECHNIQUE: The procedure, risks (including but not limited to bleeding, infection, organ damage), benefits, and alternatives were explained to the patient. Questions regarding the procedure were encouraged and answered. The patient understands and consents to the procedure. Survey fluoroscopic inspection reveals stable position of the right transgluteal percutaneous drain catheter. Gentle contrast injection opacifies small collection inferior to the drain catheter. The catheter was cut and exchanged over a stiff Glidewire for short angled Kumpe catheter, used to reposition the guidewire centrally within the collection. The Kumpe catheter was then exchanged for a new 12 French pigtail drain catheter, formed centrally within the residual cavity. Catheter was secured externally with 0 Prolene suture and StatLock and placed to gravity drain bag. The patient tolerated the procedure well. IMPRESSION: 1. Technically successful exchange and repositioning of 12 French pelvic abscess drain catheter as above. Electronically Signed   By: Lucrezia Europe M.D.   On: 09/12/2022 16:21   CT ABDOMEN PELVIS W CONTRAST  Result Date: 09/11/2022 CLINICAL DATA:  Status post pelvic abscess drainage. EXAM: CT ABDOMEN AND PELVIS WITH CONTRAST TECHNIQUE: Multidetector CT imaging of the abdomen and pelvis was performed using the standard protocol following bolus administration of intravenous contrast. RADIATION DOSE REDUCTION: This exam was performed according to the departmental dose-optimization program which includes automated exposure control, adjustment of the mA and/or kV according to patient size and/or use of iterative reconstruction technique. CONTRAST:  143mL  OMNIPAQUE IOHEXOL 300 MG/ML  SOLN COMPARISON:  September 05, 2022. FINDINGS: Lower chest: Minimal left pleural effusion is noted with adjacent subsegmental atelectasis. Pericardial effusion is noted. Hepatobiliary: No focal liver abnormality is seen. No gallstones, gallbladder wall thickening, or biliary dilatation. Pancreas: Unremarkable. No pancreatic ductal dilatation or surrounding inflammatory changes. Spleen: Normal in size without focal abnormality. Adrenals/Urinary Tract: Adrenal glands are noted. Bilateral renal cysts are noted for which no further follow-up is required. No hydronephrosis or renal obstruction is noted. Mild urinary bladder distention is noted. Stomach/Bowel: Status post gastric bypass. Colostomy is noted in left lower quadrant. Mildly dilated small bowel loops are noted which most likely represent ileus. Vascular/Lymphatic: No significant vascular findings are present. No enlarged abdominal or pelvic lymph nodes. Reproductive: Status post hysterectomy. No adnexal masses. Other: Interval placement of right transgluteal pelvic drainage catheter. Fluid collection measures 4.3 x 3.5 cm which is significantly smaller compared to prior exam. Musculoskeletal: No acute or significant osseous findings. IMPRESSION: Interval placement of right transgluteal pelvic drainage catheter. Pelvic fluid collection now measures 4.3 x 3.5 cm which is significantly smaller compared to prior exam. Small pericardial effusion is noted. Minimal left pleural effusion is noted with adjacent subsegmental atelectasis. Mild urinary bladder distention is noted. Status post colostomy placement in the left lower quadrant. Electronically Signed   By: Marijo Conception  M.D.   On: 09/11/2022 14:33    Labs:  CBC: Recent Labs    09/06/22 0401 09/07/22 0416 09/11/22 0402 09/13/22 0411  WBC 9.7 9.7 8.3 7.5  HGB 11.1* 10.3* 8.8* 9.1*  HCT 33.5* 31.1* 28.3* 28.2*  PLT 367 338 335 362    COAGS: Recent Labs     09/06/22 0401  INR 1.3*    BMP: Recent Labs    09/09/22 0553 09/10/22 0511 09/11/22 0402 09/13/22 0411  NA 135 138 137 136  K 3.9 4.1 4.2 4.2  CL 106 108 107 106  CO2 24 24 24 23   GLUCOSE 113* 99 118* 131*  BUN 12 14 14 14   CALCIUM 8.4* 8.8* 8.8* 8.8*  CREATININE 0.50 0.52 0.68 0.46  GFRNONAA >60 >60 >60 >60    LIVER FUNCTION TESTS: Recent Labs    08/24/22 0419 09/08/22 0331 09/10/22 0511 09/13/22 0411  BILITOT 0.5 0.5 0.5 0.5  AST 14* 17 15 17   ALT 13 10 10 12   ALKPHOS 48 54 59 72  PROT 7.0 5.8* 6.5 6.4*  ALBUMIN 2.8* 2.2* 2.4* 2.3*    Assessment and Plan: Pt with hx sigmoid diverticulitis with prior exploratory laparotomy, sigmoid colectomy, descending colostomy with Dr. Harlow Asa on 08/31/2022; now with postop pelvic abscess, s/p rt TG drain placement 9/14 (12 fr to JP); s/p repositioning of drain on 9/20 to hopefully expedite removal of existing fluid(12 fr to bag); temp 99.1, cx- enterococcus; cont drain flushes tid, close OP monitoring; if OP remains minimal over weekend rec f/u CT early next week; other plans as per CCS/TRH   Electronically Signed: D. Rowe Robert, PA-C 09/14/2022, 9:03 AM   I spent a total of 15 Minutes at the the patient's bedside AND on the patient's hospital floor or unit, greater than 50% of which was counseling/coordinating care for pelvic abscess drain    Patient ID: Kristin Gibson, female   DOB: 29-Nov-1956, 66 y.o.   MRN: [id number]

## 2022-09-14 NOTE — Plan of Care (Signed)

## 2022-09-14 NOTE — Progress Notes (Signed)
14 Days Post-Op  Subjective: CC: Pain controlled.  Reports discomfort from NG tube.  Reports small amount of flatus via ostomy yesterday evening.  No significant ostomy output.  No reported urinary symptoms.  Seen during wound VAC change.  Objective: Vital signs in last 24 hours: Temp:  [98.1 F (36.7 C)-99.1 F (37.3 C)] 99.1 F (37.3 C) (09/22 0557) Pulse Rate:  [81-86] 81 (09/22 0557) Resp:  [16-18] 18 (09/22 0557) BP: (134-155)/(85-95) 134/85 (09/22 0557) SpO2:  [97 %-99 %] 99 % (09/22 0557) Last BM Date : 09/14/22  Intake/Output from previous day: 09/21 0701 - 09/22 0700 In: 2345.5 [I.V.:1227.1; IV Piggyback:1118.3] Out: 2800 [Urine:1500; Emesis/NG output:1300] Intake/Output this shift: No intake/output data recorded.  PE: Gen:  Alert, NAD, pleasant Card:  Reg Pulm:  CTAB, no W/R/R, effort normal Abd: Soft, appropriately tender  Midline wound granulating, fascia is intact     Left upper quadrant stoma with complete mucocutaneous separation.  Oval sloughing of superficial colonic mucosa revealing viable, edematous colonic tissue.  Stoma patent and digitized without blood.  See below.  There is no cellulitis or current signs of wound infection.  With the help of the WOC RN Aquacel was placed around the stoma.      Right transgluteal IR drain intact with minimal serosanguineous fluid in the bulb.  Ext:  No LE edema  Psych: A&Ox3   Lab Results:  Recent Labs    09/13/22 0411  WBC 7.5  HGB 9.1*  HCT 28.2*  PLT 362   BMET Recent Labs    09/13/22 0411  NA 136  K 4.2  CL 106  CO2 23  GLUCOSE 131*  BUN 14  CREATININE 0.46  CALCIUM 8.8*   PT/INR No results for input(s): "LABPROT", "INR" in the last 72 hours. CMP     Component Value Date/Time   NA 136 09/13/2022 0411   K 4.2 09/13/2022 0411   CL 106 09/13/2022 0411   CO2 23 09/13/2022 0411   GLUCOSE 131 (H) 09/13/2022 0411   BUN 14 09/13/2022 0411   CREATININE 0.46 09/13/2022 0411   CALCIUM  8.8 (L) 09/13/2022 0411   PROT 6.4 (L) 09/13/2022 0411   ALBUMIN 2.3 (L) 09/13/2022 0411   AST 17 09/13/2022 0411   ALT 12 09/13/2022 0411   ALKPHOS 72 09/13/2022 0411   BILITOT 0.5 09/13/2022 0411   GFRNONAA >60 09/13/2022 0411   GFRAA >60 06/28/2020 0548   Lipase     Component Value Date/Time   LIPASE 26 08/21/2022 2306    Studies/Results: DG Abd 1 View  Result Date: 09/13/2022 CLINICAL DATA:  NG tube placement EXAM: ABDOMEN - 1 VIEW COMPARISON:  09/13/2022 FINDINGS: NG tube tip is in the distal stomach. Mildly dilated small bowel loops centrally. No confluent opacity in the visualized lower lungs. IMPRESSION: NG tube tip in the distal stomach. Electronically Signed   By: Charlett Nose M.D.   On: 09/13/2022 10:36   DG Abd Portable 1V  Result Date: 09/13/2022 CLINICAL DATA:  98749.  Follow-up of ileus. EXAM: PORTABLE ABDOMEN - 1 VIEW COMPARISON:  Similar study of 08/25/2022. CT with IV contrast 09/11/2022 FINDINGS: Small-bowel dilatation, primarily in the central abdomen, shows little if any change with maximal small bowel caliber 4 cm. Left mid abdominal diverting colostomy was better visible on CT with no evidence of colonic obstruction. There is no supine evidence of free air. A drainage catheter is again noted in the pelvis right of midline. No pathologic calcification is  evident.  Lung bases are clear. IMPRESSION: Unchanged central abdominal small bowel dilatation. Electronically Signed   By: Almira Bar M.D.   On: 09/13/2022 05:38   IR Catheter Tube Change  Result Date: 09/12/2022 CLINICAL DATA:  Pelvic abscess status post percutaneous drain catheter placement 09/06/2022. Decreased outputs with residual fluid collection just inferior to the drain catheter loop on recent CT. Catheter revision is requested EXAM: ABSCESS DRAIN CATHETER EXCHANGE AND REPOSITIONING UNDER FLUOROSCOPY TECHNIQUE: The procedure, risks (including but not limited to bleeding, infection, organ damage),  benefits, and alternatives were explained to the patient. Questions regarding the procedure were encouraged and answered. The patient understands and consents to the procedure. Survey fluoroscopic inspection reveals stable position of the right transgluteal percutaneous drain catheter. Gentle contrast injection opacifies small collection inferior to the drain catheter. The catheter was cut and exchanged over a stiff Glidewire for short angled Kumpe catheter, used to reposition the guidewire centrally within the collection. The Kumpe catheter was then exchanged for a new 12 French pigtail drain catheter, formed centrally within the residual cavity. Catheter was secured externally with 0 Prolene suture and StatLock and placed to gravity drain bag. The patient tolerated the procedure well. IMPRESSION: 1. Technically successful exchange and repositioning of 12 French pelvic abscess drain catheter as above. Electronically Signed   By: Corlis Leak M.D.   On: 09/12/2022 16:21    Anti-infectives: Anti-infectives (From admission, onward)    Start     Dose/Rate Route Frequency Ordered Stop   09/10/22 0600  vancomycin (VANCOCIN) IVPB 1000 mg/200 mL premix        1,000 mg 200 mL/hr over 60 Minutes Intravenous Every 12 hours 09/09/22 1620 09/14/22 2359   09/09/22 1715  vancomycin (VANCOREADY) IVPB 2000 mg/400 mL        2,000 mg 200 mL/hr over 120 Minutes Intravenous  Once 09/09/22 1620 09/09/22 1955   09/06/22 1400  piperacillin-tazobactam (ZOSYN) IVPB 3.375 g  Status:  Discontinued        3.375 g 12.5 mL/hr over 240 Minutes Intravenous Every 8 hours 09/06/22 1302 09/11/22 0839   09/02/22 1400  ceFEPIme (MAXIPIME) 2 g in sodium chloride 0.9 % 100 mL IVPB        2 g 200 mL/hr over 30 Minutes Intravenous Every 8 hours 09/02/22 1015 09/05/22 0103   08/31/22 0600  cefoTEtan (CEFOTAN) 2 g in sodium chloride 0.9 % 100 mL IVPB  Status:  Discontinued        2 g 200 mL/hr over 30 Minutes Intravenous On call to O.R.  08/30/22 1316 08/31/22 1505   08/26/22 1400  ceFEPIme (MAXIPIME) 2 g in sodium chloride 0.9 % 100 mL IVPB  Status:  Discontinued        2 g 200 mL/hr over 30 Minutes Intravenous Every 8 hours 08/26/22 0848 09/02/22 1015   08/23/22 0400  cefTRIAXone (ROCEPHIN) 2 g in sodium chloride 0.9 % 100 mL IVPB  Status:  Discontinued        2 g 200 mL/hr over 30 Minutes Intravenous Every 24 hours 08/22/22 0345 08/26/22 0848   08/22/22 1400  metroNIDAZOLE (FLAGYL) IVPB 500 mg        500 mg 100 mL/hr over 60 Minutes Intravenous Every 12 hours 08/22/22 0345 09/05/22 0449   08/22/22 0100  cefTRIAXone (ROCEPHIN) 2 g in sodium chloride 0.9 % 100 mL IVPB        2 g 200 mL/hr over 30 Minutes Intravenous  Once 08/22/22 0047 08/22/22 0226  08/22/22 0100  metroNIDAZOLE (FLAGYL) IVPB 500 mg        500 mg 100 mL/hr over 60 Minutes Intravenous  Once 08/22/22 0047 08/22/22 0412        Assessment/Plan POD 14 s/p Exploratory laparotomy, sigmoid colectomy, descending colostomy, VAC dressing placement for Diverticulitis with Colonic Obstruction by Dr. Harlow Asa on 08/31/22 - Path benign  - CT 9/13 w/ central pelvic fluid collection measuring 6.3 x 4.6cm. S/p IR drainage 9/14, Cx- enterococcus faecium only sensitive to Vanc. Changed to Vanc 9/18, plan 5d course. Repeat CT 9/19 w/ decrease in size of pelvic fluid collection now measuring 4.3 x 3.5cm. Drain with minimal output. IR planning to reposition today. - prolonged ileus, colostomy has minimal function. continue NG tube today to LIWS. Continue trial of reglan. - Continue TPN until PO intake improves - Cont wound vac - next change Monday,  - WOCN following for new ostomy education. Monitor stoma.  Today complete mucocutaneous separation was found.  Colonic mucosa does appear viable.  Continue Aquacel dressing changes MWF.  No current signs of wound infection and should continue continue to monitor closely for this.  While current condition of ostomy is not optimal,  takeback for stoma revision on postop day 14 would be extremely difficult due to development of adhesions. Her case was discussed with my attending and we decided to continue local care as above to ostomy, no plans for surgery at this time.  - Mobilize, continue therapies. Currently recommended for [facility] PT/OT.  - TOC consult for [facility] PT/OT/RN - Daily labs   FEN - NPO, TPN, NG to LIWS. May have some Ice chips/sips for comfort VTE - SCDs, restart Lovenox  ID - Vanc 9/18 >>    ABL anemia - stable Hx Gastric Sleeve Feb 2023  HTN GERD   LOS: 23 days    Jill Alexanders , [facility] Surgery 09/14/2022, 11:39 AM Please see Amion for pager number during day hours 7:00am-4:30pm

## 2022-09-14 NOTE — Progress Notes (Signed)
PHARMACY - TOTAL PARENTERAL NUTRITION CONSULT NOTE   Indication: Prolonged ileus  Patient Measurements: Height: 5\' 9"  (175.3 cm) Weight: 112 kg (247 lb) IBW/kg (Calculated) : 66.2 TPN AdjBW (KG): 77.7 Body mass index is 36.48 kg/m.  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  9/8: Exploratory laparotomy, sigmoid colectomy, descending colostomy, VAC dressing placement for Diverticulitis with Colonic Obstruction  -9/13: CT 9/13 w/ central pelvic fluid collection measuring 6.3cm -9/14: IR drainage of fluid collection (enterococcus faecium)  -9/15: Ongoing nausea and some belching, no vomiting. No gas/stool in ostomy pouch. See RD note 9/14. Con't NPO and start TPN for prolonged ileus.  GI: Persistent ileus, NGT in place - Meds: IV Reglan added, IV PPI/hs  Recent Labs    09/13/22 0411  NA 136  K 4.2  CL 106  CO2 23  GLUCOSE 131*  BUN 14  CREATININE 0.46  CALCIUM 8.8*  PHOS 4.1  MG 1.8  ALBUMIN 2.3*  ALKPHOS 72  AST 17  ALT 12  BILITOT 0.5   Corr Ca 10.2  Glucose / Insulin: pre-DM with A1C 6 (01/2022).  - CBGs <180 - 4 units SSI used in last 24 hours  Electrolytes: WNL on 9/21 (goal K>4 and Mg>2 with ileus; Mg increased in TPN 9/21) Renal: Scr <1 Hepatic: LFTs within ULN Intake / Output; MIVF:  - D5 1/2 NS 20 KCl at 30 ml/hr GI Imaging: - 9/13: CT 9/13 w/ central pelvic fluid collection measuring 6.3cm - 9/19: CT: improving size of pelvic collection    GI Surgeries / Procedures:  - 9/8: Exploratory laparotomy, sigmoid colectomy, descending colostomy, VAC dressing placement for Diverticulitis with Colonic Obstruction  - 9/14:  IR drainage of fluid collection/abscess - 9/21: Abscess catheter exchanged and repositioned  Central access: PICC TPN start date: 09/06/22  Nutritional Goals: Goal TPN rate is 70 mL/hr (provides 95 g of protein and 1774 kcals per day)  RD Assessment: Estimated Needs Total Energy Estimated Needs: 1700-1900 Total Protein Estimated Needs: 95-105g Total Fluid Estimated Needs: 1.9L/day  Current Nutrition: see RD note for timeline changes 9/15: TPN 9/20: TPN + back down to CLD 9/21: TPN; NGT reinserted, NPO except ice chips   Plan:  Continue TPN at goal rate 70 mL/hr CLD Electrolytes in TPN:  Na 50 mEq/L K 50 mEq/L Ca 5 mEq/L  Mg 10 mEq/L (increased 9/21) Phos 10 mmol/L  Cl:Ac 1:1 (standard) Add standard MVI and trace elements to TPN Reduce moderate SSI to q6h and adjust as needed  Continue MIVF at 30 mL/hr per primary MD BMet and Mg tomorrow AM; goal K > 4 and Mg >2 in setting of ileus Monitor TPN labs on Mon/Thurs, and PRN .  Joycelyn Rua, PharmD, BCPS Clinical Pharmacist 09/14/2022 7:06 AM

## 2022-09-14 NOTE — Consult Note (Signed)
[facility] Nurse wound follow up Patient receiving care in [place]. Surgical PAs L. Simaan and Evette Cristal, along with Sheppard Penton at bedside for care activities and assessment. Wound type: abdominal surgical Measurement:13.2 cm x 5 cm x 2.1 cm Wound bed: beefy red Drainage (amount, consistency, odor) serosanginous in cannister Periwound: intact. Inferior border reinforced by placement of a barrier ring Dressing procedure/placement/frequency: All black foam removed from wound bed. One piece of custom cut black foam placed into wound, drape applied, immediate seal obtained.  Patient was pre-medicated prior to care provided.  [place] Nurse ostomy follow up Stoma type/location: LUQ colostomy Stomal assessment/size: Stoma now with 100% mucocutaneous separation (MCS).  See photos for visual details Peristomal assessment: intact Treatment options for stomal/peristomal skin: MCS filled with Aquacel. 1.5 barrier rings placed around the area, pouch applied. Output: small amount of brown effluent in pouch Ostomy pouching: 1pc. convex Education provided: about the MCS and how we are treating this. Enrolled patient in [place] Start Discharge program: Yes  Supplies in room for [facility] and ostomy care.  Val Riles, RN, MSN, CWOCN, CNS-BC, pager [phone]

## 2022-09-15 LAB — BASIC METABOLIC PANEL
Anion gap: 7 (ref 5–15)
BUN: 13 mg/dL (ref 8–23)
CO2: 26 mmol/L (ref 22–32)
Calcium: 8.9 mg/dL (ref 8.9–10.3)
Chloride: 105 mmol/L (ref 98–111)
Creatinine, Ser: 0.54 mg/dL (ref 0.44–1.00)
GFR, Estimated: >60 mL/min (ref >60)
Glucose, Bld: 113 mg/dL — ABNORMAL HIGH (ref 70–99)
Potassium: 3.9 mmol/L (ref 3.5–5.1)
Sodium: 138 mmol/L (ref 135–145)

## 2022-09-15 LAB — MAGNESIUM: Magnesium: 1.9 mg/dL (ref 1.7–2.4)

## 2022-09-15 LAB — GLUCOSE, CAPILLARY
Glucose-Capillary: 120 mg/dL — ABNORMAL HIGH (ref 70–99)
Glucose-Capillary: 125 mg/dL — ABNORMAL HIGH (ref 70–99)
Glucose-Capillary: 128 mg/dL — ABNORMAL HIGH (ref 70–99)

## 2022-09-15 MED ORDER — MAGNESIUM SULFATE 2 GM/50ML IV SOLN
2.0000 g | Freq: Once | INTRAVENOUS | Status: AC
  Administered 2022-09-15: 2 g via INTRAVENOUS
  Filled 2022-09-15: qty 50

## 2022-09-15 MED ORDER — ORAL CARE MOUTH RINSE: 15.0000 mL | OROMUCOSAL | Status: DC | PRN

## 2022-09-15 MED ORDER — TRAVASOL 10 % IV SOLN
INTRAVENOUS | Status: AC
  Filled 2022-09-15: qty 957.6

## 2022-09-15 MED ORDER — INSULIN ASPART 100 UNIT/ML IJ SOLN
0.0000 [IU] | Freq: Three times a day (TID) | INTRAMUSCULAR | Status: DC
  Administered 2022-09-15 – 2022-09-17 (×2): 2 [IU] via SUBCUTANEOUS

## 2022-09-15 NOTE — Progress Notes (Signed)
Mobility Specialist - Progress Note   09/15/22 1306  Mobility  Activity Turned to right side;Turned to left side   Pt was able to turn on her sides with little to no assistance and had no complaints. Stated she is feeling better today since she has been passing gas.  Ferd Hibbs Mobility Specialist

## 2022-09-15 NOTE — Progress Notes (Signed)
PHARMACY - TOTAL PARENTERAL NUTRITION CONSULT NOTE   Indication: Prolonged ileus  Patient Measurements: Height: 5\' 9"  (175.3 cm) Weight: 112 kg (247 lb) IBW/kg (Calculated) : 66.2 TPN AdjBW (KG): 77.7 Body mass index is 36.48 kg/m.  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  9/8: Exploratory laparotomy, sigmoid colectomy, descending colostomy, VAC dressing placement for Diverticulitis with Colonic Obstruction  -9/13: CT 9/13 w/ central pelvic fluid collection measuring 6.3cm -9/14: IR drainage of fluid collection (enterococcus faecium)  -9/15: Ongoing nausea and some belching, no vomiting. No gas/stool in ostomy pouch. See RD note 9/14. Con't NPO and start TPN for prolonged ileus.  GI: Persistent ileus, NGT in place - Meds: IV Reglan added, IV PPI/hs  Recent Labs    09/13/22 0411 09/15/22 0326  NA 136 138  K 4.2 3.9  CL 106 105  CO2 23 26  GLUCOSE 131* 113*  BUN 14 13  CREATININE 0.46 0.54  CALCIUM 8.8* 8.9  PHOS 4.1  --   MG 1.8 1.9  ALBUMIN 2.3*  --   ALKPHOS 72  --   AST 17  --   ALT 12  --   BILITOT 0.5  --     Corr Ca 10.2  Glucose / Insulin: pre-DM with A1C 6 (01/2022).  - CBGs <180 - 4 units SSI used in last 24 hours  Electrolytes: WNL except K and mag slightly below goals of K>4 and Mg>2 with ileus Renal: Scr <1 Hepatic: LFTs within ULN Intake / Output; MIVF:  - D5 1/2 NS 20 KCl at 30 ml/hr GI Imaging: - 9/13: CT 9/13 w/ central pelvic fluid collection measuring 6.3cm - 9/19: CT: improving size of pelvic collection    GI Surgeries / Procedures:  - 9/8: Exploratory laparotomy, sigmoid colectomy, descending  colostomy, VAC dressing placement for Diverticulitis with Colonic Obstruction  - 9/14: IR drainage of fluid collection/abscess - 9/21: Abscess catheter exchanged and repositioned  Central access: PICC TPN start date: 09/06/22  Nutritional Goals: Goal TPN rate is 70 mL/hr (provides 95 g of protein and 1774 kcals per day)  RD Assessment: Estimated Needs Total Energy Estimated Needs: 1700-1900 Total Protein Estimated Needs: 95-105g Total Fluid Estimated Needs: 1.9L/day  Current Nutrition: see RD note for timeline changes 9/15: TPN 9/20: TPN + back down to CLD 9/21: TPN; NGT reinserted, NPO except ice chips   Plan:  Give mag sulfate 2g IV x 1 this AM Continue TPN at goal rate 70 mL/hr CLD Electrolytes in TPN:  Na 50 mEq/L K 55 mEq/L (increase) Ca 5 mEq/L  Mg 10 mEq/L Phos 10 mmol/L  Cl:Ac 1:1 (standard) Add standard MVI and trace elements to TPN Reduce moderate SSI to q8h and adjust as needed  Continue MIVF at 30 mL/hr per primary MD BMet and Mg tomorrow AM; goal K > 4 and Mg >2 in setting of ileus Monitor TPN labs on Mon/Thurs, and PRN .  Kara Mead, PharmD, BCPS Clinical Pharmacist 09/15/2022 8:11 AM

## 2022-09-15 NOTE — Plan of Care (Signed)

## 2022-09-15 NOTE — Progress Notes (Signed)
15 Days Post-Op  Subjective: CC: Pain OK.  Patient is having quite a lot of ice chips/sips and NGT output is almost completely transparent/light yellow.  Pt wondering about getting NGT out.  No gas/stool in bag per her report.    Objective: Vital signs in last 24 hours: Temp:  [98.1 F (36.7 C)-99.9 F (37.7 C)] 99.2 F (37.3 C) (09/23 0441) Pulse Rate:  [81-82] 82 (09/23 0441) Resp:  [18-19] 19 (09/23 0441) BP: (145-155)/(87-93) 154/93 (09/23 0441) SpO2:  [98 %-99 %] 99 % (09/23 0441) Last BM Date : 09/15/22  Intake/Output from previous day: 09/22 0701 - 09/23 0700 In: 2327 [I.V.:2007; IV Piggyback:320] Out: 4600 [Urine:2900; Emesis/NG output:1700] Intake/Output this shift: No intake/output data recorded.  PE: Gen:  Alert, NAD, pleasant Card:  Reg Pulm: breathing comfortably Abd: Soft, appropriately tender. Vac intact.  Stoma bag with some stool in bag (small amount), but no flatus.  Cannot see stoma well.   Right transgluteal IR drain intact with minimal serosanguineous fluid in the bulb. Ext:  No LE edema  Psych: A&Ox3   Lab Results:  Recent Labs    09/13/22 0411  WBC 7.5  HGB 9.1*  HCT 28.2*  PLT 362   BMET Recent Labs    09/13/22 0411 09/15/22 0326  NA 136 138  K 4.2 3.9  CL 106 105  CO2 23 26  GLUCOSE 131* 113*  BUN 14 13  CREATININE 0.46 0.54  CALCIUM 8.8* 8.9   PT/INR No results for input(s): "LABPROT", "INR" in the last 72 hours. CMP     Component Value Date/Time   NA 138 09/15/2022 0326   K 3.9 09/15/2022 0326   CL 105 09/15/2022 0326   CO2 26 09/15/2022 0326   GLUCOSE 113 (H) 09/15/2022 0326   BUN 13 09/15/2022 0326   CREATININE 0.54 09/15/2022 0326   CALCIUM 8.9 09/15/2022 0326   PROT 6.4 (L) 09/13/2022 0411   ALBUMIN 2.3 (L) 09/13/2022 0411   AST 17 09/13/2022 0411   ALT 12 09/13/2022 0411   ALKPHOS 72 09/13/2022 0411   BILITOT 0.5 09/13/2022 0411   GFRNONAA >60 09/15/2022 0326   GFRAA >60 06/28/2020 0548   Lipase      Component Value Date/Time   LIPASE 26 08/21/2022 2306    Studies/Results: No results found.  Anti-infectives: Anti-infectives (From admission, onward)    Start     Dose/Rate Route Frequency Ordered Stop   09/10/22 0600  vancomycin (VANCOCIN) IVPB 1000 mg/200 mL premix        1,000 mg 200 mL/hr over 60 Minutes Intravenous Every 12 hours 09/09/22 1620 09/14/22 2040   09/09/22 1715  vancomycin (VANCOREADY) IVPB 2000 mg/400 mL        2,000 mg 200 mL/hr over 120 Minutes Intravenous  Once 09/09/22 1620 09/09/22 1955   09/06/22 1400  piperacillin-tazobactam (ZOSYN) IVPB 3.375 g  Status:  Discontinued        3.375 g 12.5 mL/hr over 240 Minutes Intravenous Every 8 hours 09/06/22 1302 09/11/22 0839   09/02/22 1400  ceFEPIme (MAXIPIME) 2 g in sodium chloride 0.9 % 100 mL IVPB        2 g 200 mL/hr over 30 Minutes Intravenous Every 8 hours 09/02/22 1015 09/05/22 0103   08/31/22 0600  cefoTEtan (CEFOTAN) 2 g in sodium chloride 0.9 % 100 mL IVPB  Status:  Discontinued        2 g 200 mL/hr over 30 Minutes Intravenous On call to O.R. 08/30/22  1316 08/31/22 1505   08/26/22 1400  ceFEPIme (MAXIPIME) 2 g in sodium chloride 0.9 % 100 mL IVPB  Status:  Discontinued        2 g 200 mL/hr over 30 Minutes Intravenous Every 8 hours 08/26/22 0848 09/02/22 1015   08/23/22 0400  cefTRIAXone (ROCEPHIN) 2 g in sodium chloride 0.9 % 100 mL IVPB  Status:  Discontinued        2 g 200 mL/hr over 30 Minutes Intravenous Every 24 hours 08/22/22 0345 08/26/22 0848   08/22/22 1400  metroNIDAZOLE (FLAGYL) IVPB 500 mg        500 mg 100 mL/hr over 60 Minutes Intravenous Every 12 hours 08/22/22 0345 09/05/22 0449   08/22/22 0100  cefTRIAXone (ROCEPHIN) 2 g in sodium chloride 0.9 % 100 mL IVPB        2 g 200 mL/hr over 30 Minutes Intravenous  Once 08/22/22 0047 08/22/22 0226   08/22/22 0100  metroNIDAZOLE (FLAGYL) IVPB 500 mg        500 mg 100 mL/hr over 60 Minutes Intravenous  Once 08/22/22 0047 08/22/22 0412         Assessment/Plan POD 15 s/p Exploratory laparotomy, sigmoid colectomy, descending colostomy, VAC dressing placement for Diverticulitis with Colonic Obstruction by Dr. Harlow Asa on 08/31/22 - Path benign  - CT 9/13 w/ central pelvic fluid collection measuring 6.3 x 4.6cm. S/p IR drainage 9/14, Cx- enterococcus faecium only sensitive to Vanc. Changed to Vanc 9/18, plan 5d course. Repeat CT 9/19 w/ decrease in size of pelvic fluid collection now measuring 4.3 x 3.5cm. Drain repositioned 9/20 with good output. Now very serous.   - prolonged ileus, colostomy has minimal function.  Continue trial of reglan. Given non bilious NGT output, will try clamping NGT and possible removal later today. Will likely get repeat CT 9/26 (1 week after last one) if ileus does not resolve.   - Continue TPN until PO intake improves - Cont wound vac - next change Monday,  - WOCN following for new ostomy education.  Retracted stoma with mucocutaneous separation- observation for now.  May need revision in future but worse timing at 2-4 weeks post op.     - Mobilize, continue therapies. Currently recommended for [facility] PT/OT.  - TOC consult for [facility] PT/OT/RN - Daily labs   FEN - will try clears today, TPN, NG clamped today, possible removal later today.  VTE - SCDs, restart Lovenox  ID - Vanc 9/18 >>    ABL anemia - stable Hx Gastric Sleeve Feb 2023  HTN GERD   LOS: 24 days  Milus Height, MD FACS Surgical Oncology, General Surgery, Trauma and Peck Surgery, [place] for weekday/non holidays Check [email or web address] for coverage night/weekend/holidays

## 2022-09-16 LAB — GLUCOSE, CAPILLARY
Glucose-Capillary: 119 mg/dL — ABNORMAL HIGH (ref 70–99)
Glucose-Capillary: 123 mg/dL — ABNORMAL HIGH (ref 70–99)
Glucose-Capillary: 107 mg/dL — ABNORMAL HIGH (ref 70–99)

## 2022-09-16 LAB — BASIC METABOLIC PANEL
Anion gap: 7 (ref 5–15)
BUN: 13 mg/dL (ref 8–23)
CO2: 26 mmol/L (ref 22–32)
Calcium: 8.6 mg/dL — ABNORMAL LOW (ref 8.9–10.3)
Chloride: 103 mmol/L (ref 98–111)
Creatinine, Ser: 0.5 mg/dL (ref 0.44–1.00)
GFR, Estimated: >60 mL/min (ref >60)
Glucose, Bld: 109 mg/dL — ABNORMAL HIGH (ref 70–99)
Potassium: 3.7 mmol/L (ref 3.5–5.1)
Sodium: 136 mmol/L (ref 135–145)

## 2022-09-16 LAB — MAGNESIUM: Magnesium: 2.1 mg/dL (ref 1.7–2.4)

## 2022-09-16 MED ORDER — POTASSIUM CHLORIDE 10 MEQ/100ML IV SOLN
10.0000 meq | INTRAVENOUS | Status: AC
  Administered 2022-09-16 (×3): 10 meq via INTRAVENOUS
  Filled 2022-09-16 (×3): qty 100

## 2022-09-16 MED ORDER — TRAVASOL 10 % IV SOLN
INTRAVENOUS | Status: AC
  Filled 2022-09-16: qty 957.6

## 2022-09-16 NOTE — Progress Notes (Signed)
16 Days Post-Op  Subjective: CC: Pain OK.  Having stool in bag. No n/v.    Objective: Vital signs in last 24 hours: Temp:  [98.4 F (36.9 C)-99.4 F (37.4 C)] 98.4 F (36.9 C) (09/24 0345) Pulse Rate:  [79-80] 79 (09/24 0345) Resp:  [18-19] 19 (09/24 0345) BP: (132-164)/(82-91) 132/82 (09/24 0345) SpO2:  [98 %-100 %] 98 % (09/24 0345) Weight:  [673 kg] 109 kg (09/24 0345) Last BM Date : 09/15/22  Intake/Output from previous day: 09/23 0701 - 09/24 0700 In: 2330.8 [I.V.:2013.4; IV Piggyback:317.4] Out: 4193 [Urine:1200; Drains:20; Stool:600] Intake/Output this shift: No intake/output data recorded.  PE: Gen:  Alert, NAD, pleasant Card:  Reg Pulm: breathing comfortably Abd: Soft, appropriately tender. Vac intact.  Stoma bag  with good amount of stool in bag.   Right transgluteal IR drain intact with minimal serosanguineous fluid in the bulb. Ext:  No LE edema  Psych: A&Ox3   Lab Results:  No results for input(s): "WBC", "HGB", "HCT", "PLT" in the last 72 hours.  BMET Recent Labs    09/15/22 0326 09/16/22 0339  NA 138 136  K 3.9 3.7  CL 105 103  CO2 26 26  GLUCOSE 113* 109*  BUN 13 13  CREATININE 0.54 0.50  CALCIUM 8.9 8.6*   PT/INR No results for input(s): "LABPROT", "INR" in the last 72 hours. CMP     Component Value Date/Time   NA 136 09/16/2022 0339   K 3.7 09/16/2022 0339   CL 103 09/16/2022 0339   CO2 26 09/16/2022 0339   GLUCOSE 109 (H) 09/16/2022 0339   BUN 13 09/16/2022 0339   CREATININE 0.50 09/16/2022 0339   CALCIUM 8.6 (L) 09/16/2022 0339   PROT 6.4 (L) 09/13/2022 0411   ALBUMIN 2.3 (L) 09/13/2022 0411   AST 17 09/13/2022 0411   ALT 12 09/13/2022 0411   ALKPHOS 72 09/13/2022 0411   BILITOT 0.5 09/13/2022 0411   GFRNONAA >60 09/16/2022 0339   GFRAA >60 06/28/2020 0548   Lipase     Component Value Date/Time   LIPASE 26 08/21/2022 2306    Studies/Results: No results found.  Anti-infectives: Anti-infectives (From admission,  onward)    Start     Dose/Rate Route Frequency Ordered Stop   09/10/22 0600  vancomycin (VANCOCIN) IVPB 1000 mg/200 mL premix        1,000 mg 200 mL/hr over 60 Minutes Intravenous Every 12 hours 09/09/22 1620 09/14/22 2040   09/09/22 1715  vancomycin (VANCOREADY) IVPB 2000 mg/400 mL        2,000 mg 200 mL/hr over 120 Minutes Intravenous  Once 09/09/22 1620 09/09/22 1955   09/06/22 1400  piperacillin-tazobactam (ZOSYN) IVPB 3.375 g  Status:  Discontinued        3.375 g 12.5 mL/hr over 240 Minutes Intravenous Every 8 hours 09/06/22 1302 09/11/22 0839   09/02/22 1400  ceFEPIme (MAXIPIME) 2 g in sodium chloride 0.9 % 100 mL IVPB        2 g 200 mL/hr over 30 Minutes Intravenous Every 8 hours 09/02/22 1015 09/05/22 0103   08/31/22 0600  cefoTEtan (CEFOTAN) 2 g in sodium chloride 0.9 % 100 mL IVPB  Status:  Discontinued        2 g 200 mL/hr over 30 Minutes Intravenous On call to O.R. 08/30/22 1316 08/31/22 1505   08/26/22 1400  ceFEPIme (MAXIPIME) 2 g in sodium chloride 0.9 % 100 mL IVPB  Status:  Discontinued  2 g 200 mL/hr over 30 Minutes Intravenous Every 8 hours 08/26/22 0848 09/02/22 1015   08/23/22 0400  cefTRIAXone (ROCEPHIN) 2 g in sodium chloride 0.9 % 100 mL IVPB  Status:  Discontinued        2 g 200 mL/hr over 30 Minutes Intravenous Every 24 hours 08/22/22 0345 08/26/22 0848   08/22/22 1400  metroNIDAZOLE (FLAGYL) IVPB 500 mg        500 mg 100 mL/hr over 60 Minutes Intravenous Every 12 hours 08/22/22 0345 09/05/22 0449   08/22/22 0100  cefTRIAXone (ROCEPHIN) 2 g in sodium chloride 0.9 % 100 mL IVPB        2 g 200 mL/hr over 30 Minutes Intravenous  Once 08/22/22 0047 08/22/22 0226   08/22/22 0100  metroNIDAZOLE (FLAGYL) IVPB 500 mg        500 mg 100 mL/hr over 60 Minutes Intravenous  Once 08/22/22 0047 08/22/22 0412        Assessment/Plan POD 16 s/p Exploratory laparotomy, sigmoid colectomy, descending colostomy, VAC dressing placement for Diverticulitis with Colonic  Obstruction by Dr. Gerrit Friends on 08/31/22 - Path benign  - CT 9/13 w/ central pelvic fluid collection measuring 6.3 x 4.6cm. S/p IR drainage 9/14, Cx- enterococcus faecium only sensitive to Vanc. Changed to Vanc 9/18, plan 5d course. Repeat CT 9/19 w/ decrease in size of pelvic fluid collection now measuring 4.3 x 3.5cm. Drain repositioned 9/20 with good output. Now very serous.  Prob repeat CT in next few days to try to get drain out.   - prolonged ileus appears to be resolving. Advance to full liquids.  - Continue TPN until PO intake improves - Cont wound vac - next change Monday  - WOCN following for new ostomy education.  Retracted stoma with mucocutaneous separation- observation for now.  May need revision in future but this is the worst timing at 2-4 weeks post op.    - Mobilize, continue therapies. Currently recommended for [facility] PT/OT.  - TOC consult for [facility] PT/OT/RN - Daily labs   FEN - try full liquids.  If ileus continues to resolve, anticipate starting TNA wean tomorrow.    VTE - SCDs, restart Lovenox  ID - Vanc 9/18 >>    ABL anemia - stable Hx Gastric Sleeve Feb 2023  HTN GERD   LOS: 25 days  Maudry Diego, MD FACS Surgical Oncology, General Surgery, Trauma and Critical [facility] Surgery, [place] [phone] for weekday/non holidays Check [email or web address] for coverage night/weekend/holidays

## 2022-09-16 NOTE — TOC Progression Note (Signed)
Transition of Care [facility]) - Progression Note    Patient Details  Name: Kristin Gibson MRN: [id number] Date of Birth: [birth year]  Transition of Care [facility]) CM/SW Contact  Ross Ludwig, [place] Phone Number: 09/16/2022, 6:06 PM  Clinical Narrative:     CSW continuing to follow patient's progress throughout discharge planning, [place] already set up through [facility].  Expected Discharge Plan: Agency Barriers to Discharge: Continued Medical Work up  Expected Discharge Plan and Services Expected Discharge Plan: [place] In-house Referral: NA Discharge Planning Services: CM Consult Post Acute Care Choice: [place] [facility]) Living arrangements for the past 2 months: Apartment                 DME Arranged: Other see comment, Negative pressure wound device (raised commode seat) DME Agency: [employer] Date DME Agency Contacted: 09/03/22 Time DME Agency Contacted: [phone] Representative spoke with at DME Agency: [place]: OT, RN, PT [facility] Agency: [place] Date [place]: 09/03/22 Time [place]: [id number] Representative spoke with at [place]: [place]) Interventions    Readmission Risk Interventions    09/03/2022    3:44 PM  Readmission Risk Prevention Plan  PCP or Specialist Appt within 5-7 Days Complete  Home Care Screening Complete  Medication Review (RN CM) Complete

## 2022-09-16 NOTE — Progress Notes (Signed)
PHARMACY - TOTAL PARENTERAL NUTRITION CONSULT NOTE   Indication: Prolonged ileus  Patient Measurements: Height: 5\' 9"  (175.3 cm) Weight: 109 kg (240 lb 4.8 oz) IBW/kg (Calculated) : 66.2 TPN AdjBW (KG): 77.7 Body mass index is 35.49 kg/m.  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  9/8: Exploratory laparotomy, sigmoid colectomy, descending colostomy, VAC dressing placement for Diverticulitis with Colonic Obstruction  -9/13: CT 9/13 w/ central pelvic fluid collection measuring 6.3cm -9/14: IR drainage of fluid collection (enterococcus faecium)  -9/15: Ongoing nausea and some belching, no vomiting. No gas/stool in ostomy pouch. See RD note 9/14. Con't NPO and start TPN for prolonged ileus.  GI: Persistent ileus, NGT in place - Meds: IV Reglan added, IV PPI/hs  Recent Labs    09/15/22 0326 09/16/22 0339  NA 138 136  K 3.9 3.7  CL 105 103  CO2 26 26  GLUCOSE 113* 109*  BUN 13 13  CREATININE 0.54 0.50  CALCIUM 8.9 8.6*  MG 1.9 2.1    Corr Ca 10.2  Glucose / Insulin: pre-DM with A1C 6 (01/2022).  - CBGs <180 - 4 units SSI used in last 24 hours  Electrolytes: WNL except K slightly below goals of K>4 and Mg>2 with ileus Renal: Scr <1 Hepatic: LFTs within ULN Intake / Output; MIVF:  - D5 1/2 NS 20 KCl at 30 ml/hr GI Imaging: - 9/13: CT 9/13 w/ central pelvic fluid collection measuring 6.3cm - 9/19: CT: improving size of pelvic collection    GI Surgeries / Procedures:  - 9/8: Exploratory laparotomy, sigmoid colectomy, descending colostomy, VAC dressing placement for Diverticulitis with Colonic Obstruction  - 9/14: IR drainage of  fluid collection/abscess - 9/21: Abscess catheter exchanged and repositioned  Central access: PICC TPN start date: 09/06/22  Nutritional Goals: Goal TPN rate is 70 mL/hr (provides 95 g of protein and 1774 kcals per day)  RD Assessment: Estimated Needs Total Energy Estimated Needs: 1700-1900 Total Protein Estimated Needs: 95-105g Total Fluid Estimated Needs: 1.9L/day  Current Nutrition: see RD note for timeline changes CLD - advancing to FLD 9/24 TPN   Plan:  50mEq KCL IV this AM Continue TPN at goal rate 70 mL/hr FLD - per surgery Md note, if ileus continues to resolve, anticipate starting to wean TPN beginning 9/25 Electrolytes in TPN:  Na 50 mEq/L K 60 mEq/L (increase) Ca 5 mEq/L  Mg 10 mEq/L Phos 10 mmol/L  Cl:Ac 1:1 (standard) Add standard MVI and trace elements to TPN Reduce moderate SSI to q8h and adjust as needed  Continue MIVF at 30 mL/hr per primary MD Monitor TPN labs on Mon/Thurs, and PRN .  Kara Mead, PharmD, BCPS Clinical Pharmacist 09/16/2022 9:57 AM

## 2022-09-17 ENCOUNTER — Inpatient Hospital Stay (HOSPITAL_COMMUNITY): Payer: Medicare HMO

## 2022-09-17 LAB — GLUCOSE, CAPILLARY
Glucose-Capillary: 112 mg/dL — ABNORMAL HIGH (ref 70–99)
Glucose-Capillary: 125 mg/dL — ABNORMAL HIGH (ref 70–99)
Glucose-Capillary: 108 mg/dL — ABNORMAL HIGH (ref 70–99)

## 2022-09-17 LAB — COMPREHENSIVE METABOLIC PANEL
ALT: 13 U/L (ref 0–44)
AST: 19 U/L (ref 15–41)
Albumin: 2.5 g/dL — ABNORMAL LOW (ref 3.5–5.0)
Alkaline Phosphatase: 84 U/L (ref 38–126)
Anion gap: 7 (ref 5–15)
BUN: 15 mg/dL (ref 8–23)
CO2: 24 mmol/L (ref 22–32)
Calcium: 8.9 mg/dL (ref 8.9–10.3)
Chloride: 105 mmol/L (ref 98–111)
Creatinine, Ser: 0.46 mg/dL (ref 0.44–1.00)
GFR, Estimated: >60 mL/min (ref >60)
Glucose, Bld: 103 mg/dL — ABNORMAL HIGH (ref 70–99)
Potassium: 4.1 mmol/L (ref 3.5–5.1)
Sodium: 136 mmol/L (ref 135–145)
Total Bilirubin: 0.5 mg/dL (ref 0.3–1.2)
Total Protein: 6.9 g/dL (ref 6.5–8.1)

## 2022-09-17 LAB — TRIGLYCERIDES: Triglycerides: 56 mg/dL (ref <150)

## 2022-09-17 LAB — PHOSPHORUS: Phosphorus: 4.6 mg/dL (ref 2.5–4.6)

## 2022-09-17 LAB — MAGNESIUM: Magnesium: 2 mg/dL (ref 1.7–2.4)

## 2022-09-17 MED ORDER — TRAVASOL 10 % IV SOLN
INTRAVENOUS | Status: AC
  Filled 2022-09-17: qty 1044

## 2022-09-17 MED ORDER — SODIUM CHLORIDE (PF) 0.9 % IJ SOLN
INTRAMUSCULAR | Status: AC
  Filled 2022-09-17: qty 50

## 2022-09-17 MED ORDER — ENSURE SURGERY PO LIQD
237.0000 mL | Freq: Three times a day (TID) | ORAL | Status: DC
  Administered 2022-09-17 – 2022-09-20 (×7): 237 mL via ORAL
  Filled 2022-09-17 (×12): qty 237

## 2022-09-17 MED ORDER — IOHEXOL 9 MG/ML PO SOLN
500.0000 mL | ORAL | Status: AC
  Administered 2022-09-17 (×2): 500 mL via ORAL

## 2022-09-17 MED ORDER — IOHEXOL 9 MG/ML PO SOLN
ORAL | Status: AC
  Filled 2022-09-17: qty 1000

## 2022-09-17 MED ORDER — IOHEXOL 300 MG/ML  SOLN
100.0000 mL | Freq: Once | INTRAMUSCULAR | Status: AC | PRN
  Administered 2022-09-17: 100 mL via INTRAVENOUS

## 2022-09-17 NOTE — Progress Notes (Signed)
[facility] Progress Note  17 Days Post-Op  Subjective: CC-  Feeling well this morning. No n/v yesterday. Tolerating full liquids but not eating much because she does not like the options. Colostomy functioning. IR drain with no output last 24 hours.  Objective: Vital signs in last 24 hours: Temp:  [98.3 F (36.8 C)-98.6 F (37 C)] 98.5 F (36.9 C) (09/25 0543) Pulse Rate:  [62-78] 62 (09/25 0543) Resp:  [18] 18 (09/25 0543) BP: (136-146)/(85-92) 140/86 (09/25 0543) SpO2:  [98 %-99 %] 99 % (09/25 0543) Last BM Date : 09/16/22  Intake/Output from previous day: 09/24 0701 - 09/25 0700 In: 3283.9 [P.O.:240; I.V.:2380.5; IV Piggyback:663.4] Out: 1310 [Urine:1300; Stool:10] Intake/Output this shift: No intake/output data recorded.  PE: Gen:  Alert, NAD, pleasant Pulm: rate and effort normal Abd: Soft, appropriately tender.  Open midline wound pictured below with healthy granulation tissue. Ostomy viable but retracted. Right transgluteal IR drain intact with scant serosanguineous fluid in the bag      Lab Results:  No results for input(s): "WBC", "HGB", "HCT", "PLT" in the last 72 hours. BMET Recent Labs    09/16/22 0339 09/17/22 0438  NA 136 136  K 3.7 4.1  CL 103 105  CO2 26 24  GLUCOSE 109* 103*  BUN 13 15  CREATININE 0.50 0.46  CALCIUM 8.6* 8.9   PT/INR No results for input(s): "LABPROT", "INR" in the last 72 hours. CMP     Component Value Date/Time   NA 136 09/17/2022 0438   K 4.1 09/17/2022 0438   CL 105 09/17/2022 0438   CO2 24 09/17/2022 0438   GLUCOSE 103 (H) 09/17/2022 0438   BUN 15 09/17/2022 0438   CREATININE 0.46 09/17/2022 0438   CALCIUM 8.9 09/17/2022 0438   PROT 6.9 09/17/2022 0438   ALBUMIN 2.5 (L) 09/17/2022 0438   AST 19 09/17/2022 0438   ALT 13 09/17/2022 0438   ALKPHOS 84 09/17/2022 0438   BILITOT 0.5 09/17/2022 0438   GFRNONAA >60 09/17/2022 0438   GFRAA >60 06/28/2020 0548   Lipase     Component Value  Date/Time   LIPASE 26 08/21/2022 2306       Studies/Results: No results found.  Anti-infectives: Anti-infectives (From admission, onward)    Start     Dose/Rate Route Frequency Ordered Stop   09/10/22 0600  vancomycin (VANCOCIN) IVPB 1000 mg/200 mL premix        1,000 mg 200 mL/hr over 60 Minutes Intravenous Every 12 hours 09/09/22 1620 09/14/22 2040   09/09/22 1715  vancomycin (VANCOREADY) IVPB 2000 mg/400 mL        2,000 mg 200 mL/hr over 120 Minutes Intravenous  Once 09/09/22 1620 09/09/22 1955   09/06/22 1400  piperacillin-tazobactam (ZOSYN) IVPB 3.375 g  Status:  Discontinued        3.375 g 12.5 mL/hr over 240 Minutes Intravenous Every 8 hours 09/06/22 1302 09/11/22 0839   09/02/22 1400  ceFEPIme (MAXIPIME) 2 g in sodium chloride 0.9 % 100 mL IVPB        2 g 200 mL/hr over 30 Minutes Intravenous Every 8 hours 09/02/22 1015 09/05/22 0103   08/31/22 0600  cefoTEtan (CEFOTAN) 2 g in sodium chloride 0.9 % 100 mL IVPB  Status:  Discontinued        2 g 200 mL/hr over 30 Minutes Intravenous On call to O.R. 08/30/22 1316 08/31/22 1505   08/26/22 1400  ceFEPIme (MAXIPIME) 2 g in sodium chloride 0.9 % 100 mL IVPB  Status:  Discontinued        2 g 200 mL/hr over 30 Minutes Intravenous Every 8 hours 08/26/22 0848 09/02/22 1015   08/23/22 0400  cefTRIAXone (ROCEPHIN) 2 g in sodium chloride 0.9 % 100 mL IVPB  Status:  Discontinued        2 g 200 mL/hr over 30 Minutes Intravenous Every 24 hours 08/22/22 0345 08/26/22 0848   08/22/22 1400  metroNIDAZOLE (FLAGYL) IVPB 500 mg        500 mg 100 mL/hr over 60 Minutes Intravenous Every 12 hours 08/22/22 0345 09/05/22 0449   08/22/22 0100  cefTRIAXone (ROCEPHIN) 2 g in sodium chloride 0.9 % 100 mL IVPB        2 g 200 mL/hr over 30 Minutes Intravenous  Once 08/22/22 0047 08/22/22 0226   08/22/22 0100  metroNIDAZOLE (FLAGYL) IVPB 500 mg        500 mg 100 mL/hr over 60 Minutes Intravenous  Once 08/22/22 0047 08/22/22 0412         Assessment/Plan POD 17 s/p Exploratory laparotomy, sigmoid colectomy, descending colostomy, VAC dressing placement for Diverticulitis with Colonic Obstruction by Dr. Gerrit Friends on 08/31/22 - Path benign  - CT 9/13 w/ central pelvic fluid collection measuring 6.3 x 4.6cm. S/p IR drainage 9/14, Cx- enterococcus faecium only sensitive to Vanc. Changed to Vanc 9/18, plan 5d course. Repeat CT 9/19 w/ decrease in size of pelvic fluid collection now measuring 4.3 x 3.5cm. Drain repositioned 9/20 with good output. Now output serosanguinous and minimal. Will repeat CT today to evaluate for resolution of abscess, and if drain can be removed. - prolonged ileus appears to be resolving. Advance to soft diet and add Ensure. If tolerated well will plan to wean TPN tomorrow. - Vac MWF. Plan for home with vac  - WOCN following for new ostomy education.  Retracted stoma with mucocutaneous separation- observation for now as it is viable and function. May need revision in the future   - Mobilize, continue therapies. Currently recommended for [facility] PT/OT.  - TOC consult for [facility] PT/OT/RN   FEN - TPN, soft diet, Ensure   VTE - SCDs, Lovenox  ID - none currently   ABL anemia Hx Gastric Sleeve Feb 2023  HTN GERD    LOS: 26 days    Franne Forts, [facility] Surgery 09/17/2022, 9:33 AM Please see Amion for pager number during day hours 7:00am-4:30pm

## 2022-09-17 NOTE — Progress Notes (Signed)
Nutrition Follow-up  DOCUMENTATION CODES:   Obesity unspecified  INTERVENTION:   -Ensure Surgery PO TID, each provides 330 kcals and 18g protein   -Recommend bariatric MVI once TPN is weaned off  -TPN management per Pharmacy  NUTRITION DIAGNOSIS:   Increased nutrient needs related to post-op healing (h/o gastric sleeve) as evidenced by estimated needs.  Ongoing.  GOAL:   Patient will meet greater than or equal to 90% of their needs  Meeting with TPN  MONITOR:   PO intake, Supplement acceptance, Labs, Weight trends, I & O's  ASSESSMENT:   66 year old female with history of gastric sleeve surgery for weight loss February 2023, recurrent diverticulitis, recently completed 2 rounds of antibiotics presented with worsening abdominal pain with fever and chills. She was admitted with sigmoid diverticulitis and started on iv antibiotics.  8/29: admitted, NPO 9/1: CLD 9/2: NPO 9/3: CLD 9/5: FLD 9/7: NPO 9/8: s/p ex lap, sig colectomy, colostomy, VAC 9/9: CLD -> FLD 9/13: NPO 9/14: TPN initiated, drain placed 9/16: CLD 9/17: Soft diet 9/20: CLD  9/21: NGT placed for suction 9/23: NGT removed, CLD 9/24: FLD 9/25: Soft diet  Diet advanced to soft today. Tolerated fulls but not liking much of the liquid options. Surgery has ordered Ensure for patient, will see how much she is able to take in.   TPN continuing at 75 ml/hr today, providing 1814 kcals and 104g protein.  Admission weight: 248 lbs Current weight: 240 lbs  Medications: Reglan  Labs reviewed: CBGs: 107-125  Diet Order:   Diet Order             DIET SOFT Room service appropriate? Yes; Fluid consistency: Thin  Diet effective now                   EDUCATION NEEDS:   No education needs have been identified at this time  Skin:  Skin Assessment: Skin Integrity Issues: Skin Integrity Issues:: Incisions Incisions: 9/8 abdomen  Last BM:  9/25 -type 7  Height:   Ht Readings from Last 1  Encounters:  08/31/22 5\' 9"  (1.753 m)    Weight:   Wt Readings from Last 1 Encounters:  09/16/22 109 kg    BMI:  Body mass index is 35.49 kg/m.  Estimated Nutritional Needs:   Kcal:  1700-1900  Protein:  95-105g  Fluid:  1.9L/day  Clayton Bibles, MS, RD, LDN Inpatient Clinical Dietitian Contact information available via Amion

## 2022-09-17 NOTE — Progress Notes (Signed)
PHARMACY - TOTAL PARENTERAL NUTRITION CONSULT NOTE   Indication: Prolonged ileus  Patient Measurements: Height: 5\' 9"  (175.3 cm) Weight: 109 kg (240 lb 4.8 oz) IBW/kg (Calculated) : 66.2 TPN AdjBW (KG): 77.7 Body mass index is 35.49 kg/m.  Assessment:  9 yoF with PMH gastric sleeve resection in Feb, recurrent diverticulitis, admitted with abd pain, fever/chills; found to have sigmoid diverticulitis. Patient failed conservative measures and required colectomy with colostomy placement on 9/8. Developed post-op intra-abdominal abscess and ileus, and was started on TPN 9/14.  Glucose / Insulin: pre-DM with A1C 6 (01/2022).  - CBGs very well controlled (goal 100-150) - no SSI required yesterday Electrolytes: all WNL; K, Mg at goal for ileus Renal: SCr, BUN stable WNL; UOP adequate (or incompletely charted) Hepatic: transaminases, bili, TG all stable WNL; albumin low/stable I/O:  - 500 cc stool in ostomy bag yesterday, no n/v - minimal drain output - MIVF: D5 1/2 NS 20 KCl at 30 ml/hr - tolerating fulls but minimal intake d/t not liking available options GI Imaging: - 9/13: CT 9/13 w/ central pelvic fluid collection measuring 6.3cm - 9/19: CT: improving size of pelvic collection  GI Surgeries / Procedures:  - 9/8: Exploratory laparotomy, sigmoid colectomy, descending colostomy, VAC dressing placement for Diverticulitis with Colonic Obstruction  - 9/14: IR drainage of fluid collection/abscess - 9/21: Abscess catheter exchanged and repositioned  Central access: PICC TPN start date: 09/06/22  Nutritional Goals: Goal TPN rate is 75 mL/hr (provides 104 g of protein and 1814 kcals per day)  RD Assessment: Estimated Needs Total Energy Estimated Needs: 1700-1900 Total Protein Estimated Needs: 95-105g Total Fluid Estimated Needs: 1.9L/day  Current Nutrition: see RD note for timeline changes FLD - advancing to soft diet today 9/25 TPN   Plan:  Adjust TPN goal rate to 75  mL/hr Electrolytes in TPN: incr K Na 50 mEq/L K 65 mEq/L Ca 5 mEq/L  Mg 10 mEq/L Phos 10 mmol/L  Cl:Ac 1:1 (standard) Add standard MVI and trace elements to TPN Stop SSI Stop MIVF; further per primary Monitor TPN labs on Mon/Thurs BMP tomorrow AM .  Polly Cobia, PharmD, BCPS Clinical Pharmacist 09/17/2022 8:44 AM

## 2022-09-17 NOTE — Progress Notes (Signed)
Physical Therapy Treatment Patient Details Name: Kristin Gibson MRN: [id number] DOB: [birth year] Today's Date: 09/17/2022   History of Present Illness 66 year old female with history of gastric sleeve surgery for weight loss February 2023, recurrent diverticulitis, recently completed 2 rounds of antibiotics presented with worsening abdominal pain with fever and chills. She was admitted with sigmoid diverticulitis and started on iv antibiotics. General surgery was consulted.  Patient underwent a exploratory laparotomy and colostomy on 08/31/2022. Pt s/p Right TG drain placement 9/14    PT Comments    Pt assisted with ambulating in hallway, using bathroom and then returned to bed.  Pt working on drinking contrast for CT later.  Pt min/guard to supervision with mobility at this time.  If pt continues to be mobilizing well next visit, will consider d/c from acute PT.  Recommend staff and/or mobility specialists continue to assist pt with mobility.    Recommendations for follow up therapy are one component of a multi-disciplinary discharge planning process, led by the attending physician.  Recommendations may be updated based on patient status, additional functional criteria and insurance authorization.  Follow Up Recommendations  Home health PT     Assistance Recommended at Discharge Intermittent Supervision/Assistance  Patient can return home with the following A little help with walking and/or transfers;A little help with bathing/dressing/bathroom;Assistance with cooking/housework;Assist for transportation;Help with stairs or ramp for entrance   Equipment Recommendations  None recommended by PT    Recommendations for Other Services       Precautions / Restrictions Precautions Precautions: Fall Precaution Comments: colostomy, VAC, Rt TG drain     Mobility  Bed Mobility Overal bed mobility: Needs Assistance Bed Mobility: Supine to Sit     Supine to sit: Min guard, HOB elevated      General bed mobility comments: pt self assisted LEs with UEs    Transfers Overall transfer level: Needs assistance Equipment used: Rolling walker (2 wheels) Transfers: Sit to/from Stand Sit to Stand: Min guard, From elevated surface           General transfer comment: assist for lines only    Ambulation/Gait Ambulation/Gait assistance: Min guard Gait Distance (Feet): 400 Feet Assistive device: Rolling walker (2 wheels) Gait Pattern/deviations: Step-through pattern, Decreased stride length       General Gait Details: assist for lines only   Stairs             Wheelchair Mobility    Modified Rankin (Stroke Patients Only)       Balance                                            Cognition Arousal/Alertness: Awake/alert Behavior During Therapy: WFL for tasks assessed/performed Overall Cognitive Status: Within Functional Limits for tasks assessed                                          Exercises      General Comments        Pertinent Vitals/Pain Pain Assessment Pain Assessment: Faces Faces Pain Scale: Hurts little more Pain Location: Rt transgluteal drain Pain Descriptors / Indicators: Discomfort Pain Intervention(s): Repositioned, Monitored during session    Home Living  Prior Function            PT Goals (current goals can now be found in the care plan section) Acute Rehab PT Goals PT Goal Formulation: With patient Time For Goal Achievement: 10/01/22 Potential to Achieve Goals: Good Progress towards PT goals: Progressing toward goals    Frequency    Min 3X/week      PT Plan Current plan remains appropriate    Co-evaluation              AM-PAC PT "6 Clicks" Mobility   Outcome Measure  Help needed turning from your back to your side while in a flat bed without using bedrails?: A Little Help needed moving from lying on your back to sitting on the  side of a flat bed without using bedrails?: A Little Help needed moving to and from a bed to a chair (including a wheelchair)?: A Little Help needed standing up from a chair using your arms (e.g., wheelchair or bedside chair)?: A Little Help needed to walk in hospital room?: A Little Help needed climbing 3-5 steps with a railing? : A Lot 6 Click Score: 17    End of Session   Activity Tolerance: Patient tolerated treatment well Patient left: in bed;with call bell/phone within reach;with family/visitor present   PT Visit Diagnosis: Difficulty in walking, not elsewhere classified (R26.2)     Time: [id number] PT Time Calculation (min) (ACUTE ONLY): 28 min  Charges:  $Gait Training: 8-22 mins           Kristin Gibson, Kristin Gibson Physical Therapist Acute Rehabilitation Services Preferred contact method: Secure Chat Weekend Pager Only: [phone] Office: [phone]    Kristin Gibson 09/17/2022, 3:27 PM

## 2022-09-17 NOTE — Consult Note (Signed)
[place] Nurse wound follow up PA B. Meuth present for [facility] dressing change, wound assessment and ostomy eval. Wound type: abdominal surgical Measurement: na Wound bed: beefy red Drainage (amount, consistency, odor) pink tinged in cannister Periwound: intact Dressing procedure/placement/frequency: One piece of black foam removed, one piece placed into wound. Barrier ring at inferior border. Drape applied, immediate seal obtained. Patient had been premedicated and tolerated very well.  [place] Nurse ostomy follow up Pouch placed Friday leaked. Patient and I talked about how the mucocutaneous separation will continue to contribute to leakage issues until the  MCS is resolved.  I placed one continuous piece of Aquacel into the MCS space and placed one barrier ring around the stoma. Pouch placed.  Extra supplies in room.  Val Riles, RN, MSN, CWOCN, CNS-BC, pager [phone]

## 2022-09-18 ENCOUNTER — Inpatient Hospital Stay (HOSPITAL_COMMUNITY): Payer: Medicare HMO

## 2022-09-18 HISTORY — PX: IR SINUS/FIST TUBE CHK-NON GI: IMG673

## 2022-09-18 LAB — BASIC METABOLIC PANEL
Anion gap: 6 (ref 5–15)
BUN: 21 mg/dL (ref 8–23)
CO2: 23 mmol/L (ref 22–32)
Calcium: 8.9 mg/dL (ref 8.9–10.3)
Chloride: 107 mmol/L (ref 98–111)
Creatinine, Ser: 0.6 mg/dL (ref 0.44–1.00)
GFR, Estimated: >60 mL/min (ref >60)
Glucose, Bld: 109 mg/dL — ABNORMAL HIGH (ref 70–99)
Potassium: 4.3 mmol/L (ref 3.5–5.1)
Sodium: 136 mmol/L (ref 135–145)

## 2022-09-18 MED ORDER — IOHEXOL 300 MG/ML  SOLN
50.0000 mL | Freq: Once | INTRAMUSCULAR | Status: AC | PRN
  Administered 2022-09-18: 5 mL

## 2022-09-18 MED ORDER — TRAVASOL 10 % IV SOLN
INTRAVENOUS | Status: AC
  Filled 2022-09-18: qty 556.8

## 2022-09-18 MED ORDER — MAGIC MOUTHWASH
5.0000 mL | Freq: Three times a day (TID) | ORAL | Status: DC | PRN
  Administered 2022-09-18 – 2022-09-20 (×7): 5 mL via ORAL
  Filled 2022-09-18 (×11): qty 5

## 2022-09-18 NOTE — TOC Progression Note (Addendum)
Transition of Care [facility]) - Progression Note    Patient Details  Name: Kristin Gibson MRN: [id number] Date of Birth: 1956/10/18  Transition of Care [facility]) CM/SW Contact  Loyalty Brashier, Juliann Pulse, RN Phone Number: 09/18/2022, 10:04 AM  Clinical Narrative:Noted wound vac dme ordered-[employer] rep Olivia Mackie following if able to accept via insurance/also [employer] rep Danielle checking. Alvis Lemmings already following for HHRN/PT/OT [place] services. Continue to monitor for d/c plans.       Expected Discharge Plan: [place] Barriers to Discharge: Continued Medical Work up  Expected Discharge Plan and Services Expected Discharge Plan: [place] In-house Referral: NA Discharge Planning Services: CM Consult Post Acute Care Choice: [place] [facility]) Living arrangements for the past 2 months: Apartment                 DME Arranged: Other see comment, Negative pressure wound device (raised commode seat) DME Agency: [employer], [employer] Date DME Agency Contacted: 09/03/22 Time DME Agency Contacted: [phone] Representative spoke with at DME Agency: [place]: OT, RN, PT [facility] Agency: [place] Date [place] Line: 09/03/22 Time [place]: [id number] Representative spoke with at [place]: [place]) Interventions    Readmission Risk Interventions    09/03/2022    3:44 PM  Readmission Risk Prevention Plan  PCP or Specialist Appt within 5-7 Days Complete  Home Care Screening Complete  Medication Review (RN CM) Complete

## 2022-09-18 NOTE — Progress Notes (Signed)
[facility] Progress Note  18 Days Post-Op  Subjective: CC-  Feeling well this morning. No n/v yesterday. Tolerating full liquids but not eating much because she does not like the options. Colostomy functioning. IR drain with no output last 24 hours.  Objective: Vital signs in last 24 hours: Temp:  [98.4 F (36.9 C)-99.1 F (37.3 C)] 98.4 F (36.9 C) (09/26 0525) Pulse Rate:  [62-83] 62 (09/26 0525) Resp:  [16-17] 16 (09/26 0525) BP: (119-141)/(72-86) 119/73 (09/26 0525) SpO2:  [99 %-100 %] 99 % (09/26 0525) Last BM Date : 09/17/22  Intake/Output from previous day: 09/25 0701 - 09/26 0700 In: 2953.7 [P.O.:1200; I.V.:1418.7; IV Piggyback:330] Out: 2750 [Urine:2600; Drains:100; Stool:50] Intake/Output this shift: No intake/output data recorded.  PE: Gen:  Alert, NAD, pleasant Pulm: rate and effort normal Abd: Soft, appropriately tender.  Open midline wound pictured below with healthy granulation tissue. Ostomy viable but retracted. Right transgluteal IR drain intact with scant serosanguineous fluid in the bag      Lab Results:  No results for input(s): "WBC", "HGB", "HCT", "PLT" in the last 72 hours. BMET Recent Labs    09/17/22 0438 09/18/22 0416  NA 136 136  K 4.1 4.3  CL 105 107  CO2 24 23  GLUCOSE 103* 109*  BUN 15 21  CREATININE 0.46 0.60  CALCIUM 8.9 8.9    PT/INR No results for input(s): "LABPROT", "INR" in the last 72 hours. CMP     Component Value Date/Time   NA 136 09/18/2022 0416   K 4.3 09/18/2022 0416   CL 107 09/18/2022 0416   CO2 23 09/18/2022 0416   GLUCOSE 109 (H) 09/18/2022 0416   BUN 21 09/18/2022 0416   CREATININE 0.60 09/18/2022 0416   CALCIUM 8.9 09/18/2022 0416   PROT 6.9 09/17/2022 0438   ALBUMIN 2.5 (L) 09/17/2022 0438   AST 19 09/17/2022 0438   ALT 13 09/17/2022 0438   ALKPHOS 84 09/17/2022 0438   BILITOT 0.5 09/17/2022 0438   GFRNONAA >60 09/18/2022 0416   GFRAA >60 06/28/2020 0548   Lipase      Component Value Date/Time   LIPASE 26 08/21/2022 2306       Studies/Results: CT ABDOMEN PELVIS W CONTRAST  Result Date: 09/17/2022 CLINICAL DATA:  Postop abdominal pain EXAM: CT ABDOMEN AND PELVIS WITH CONTRAST TECHNIQUE: Multidetector CT imaging of the abdomen and pelvis was performed using the standard protocol following bolus administration of intravenous contrast. RADIATION DOSE REDUCTION: This exam was performed according to the departmental dose-optimization program which includes automated exposure control, adjustment of the mA and/or kV according to patient size and/or use of iterative reconstruction technique. CONTRAST:  135mL OMNIPAQUE IOHEXOL 300 MG/ML  SOLN COMPARISON:  Status post Hartmann's pouch on 08/31/2022 and abscess drain FINDINGS: Lower chest: Small pericardial effusion, decreased in size when compared with prior exam. Small hiatal hernia. Hepatobiliary: No focal liver abnormality is seen. No gallstones, gallbladder wall thickening, or biliary dilatation. Pancreas: Unremarkable. No pancreatic ductal dilatation or surrounding inflammatory changes. Spleen: Normal in size without focal abnormality. Adrenals/Urinary Tract: Bilateral adrenal glands are unremarkable. No hydronephrosis. Nephrolithiasis. Bilateral low-attenuation renal lesions, largest are compatible with simple cysts, others are too small to completely characterize, no specific follow-up imaging is recommended. Stomach/Bowel: Prior sigmoidectomy with left lower quadrant colostomy and Hartmann's pouch. Pigtail drainage catheter is seen in the pelvis with trace residual air and fluid collection measuring 13 x 4 mm on series 5, image 61. Status post gastric bypass. Decreased mild dilation of  small bowel loops, likely due to resolving ileus. Vascular/Lymphatic: Mildaortic atherosclerosis. No enlarged abdominal or pelvic lymph nodes. Reproductive: No adnexal mass. Other: Postsurgical changes of the anterior abdominal wall. Mild  mesenteric soft tissue stranding, most pronounced in the left hemiabdomen, likely postsurgical. Musculoskeletal: No acute or significant osseous findings. IMPRESSION: 1. Postsurgical changes from sigmoidectomy and lower quadrant colostomy. Pigtail drainage catheter is seen in the pelvis with trace residual air and fluid collection which is significantly smaller when compared to prior exam. 2. Decreased size of small pericardial effusion interval resolution of left pleural effusion. Electronically Signed   By: Yetta Glassman M.D.   On: 09/17/2022 14:17    Anti-infectives: Anti-infectives (From admission, onward)    Start     Dose/Rate Route Frequency Ordered Stop   09/10/22 0600  vancomycin (VANCOCIN) IVPB 1000 mg/200 mL premix        1,000 mg 200 mL/hr over 60 Minutes Intravenous Every 12 hours 09/09/22 1620 09/14/22 2040   09/09/22 1715  vancomycin (VANCOREADY) IVPB 2000 mg/400 mL        2,000 mg 200 mL/hr over 120 Minutes Intravenous  Once 09/09/22 1620 09/09/22 1955   09/06/22 1400  piperacillin-tazobactam (ZOSYN) IVPB 3.375 g  Status:  Discontinued        3.375 g 12.5 mL/hr over 240 Minutes Intravenous Every 8 hours 09/06/22 1302 09/11/22 0839   09/02/22 1400  ceFEPIme (MAXIPIME) 2 g in sodium chloride 0.9 % 100 mL IVPB        2 g 200 mL/hr over 30 Minutes Intravenous Every 8 hours 09/02/22 1015 09/05/22 0103   08/31/22 0600  cefoTEtan (CEFOTAN) 2 g in sodium chloride 0.9 % 100 mL IVPB  Status:  Discontinued        2 g 200 mL/hr over 30 Minutes Intravenous On call to O.R. 08/30/22 1316 08/31/22 1505   08/26/22 1400  ceFEPIme (MAXIPIME) 2 g in sodium chloride 0.9 % 100 mL IVPB  Status:  Discontinued        2 g 200 mL/hr over 30 Minutes Intravenous Every 8 hours 08/26/22 0848 09/02/22 1015   08/23/22 0400  cefTRIAXone (ROCEPHIN) 2 g in sodium chloride 0.9 % 100 mL IVPB  Status:  Discontinued        2 g 200 mL/hr over 30 Minutes Intravenous Every 24 hours 08/22/22 0345 08/26/22 0848    08/22/22 1400  metroNIDAZOLE (FLAGYL) IVPB 500 mg        500 mg 100 mL/hr over 60 Minutes Intravenous Every 12 hours 08/22/22 0345 09/05/22 0449   08/22/22 0100  cefTRIAXone (ROCEPHIN) 2 g in sodium chloride 0.9 % 100 mL IVPB        2 g 200 mL/hr over 30 Minutes Intravenous  Once 08/22/22 0047 08/22/22 0226   08/22/22 0100  metroNIDAZOLE (FLAGYL) IVPB 500 mg        500 mg 100 mL/hr over 60 Minutes Intravenous  Once 08/22/22 0047 08/22/22 0412        Assessment/Plan POD 18 s/p Exploratory laparotomy, sigmoid colectomy, descending colostomy, VAC dressing placement for Diverticulitis with Colonic Obstruction by Dr. Harlow Asa on 08/31/22 - Path benign  - CT 9/13 w/ central pelvic fluid collection measuring 6.3 x 4.6cm. S/p IR drainage 9/14, Cx- enterococcus faecium only sensitive to Vanc. Changed to Vanc 9/18, plan 5d course. Repeat CT 9/19 w/ decrease in size of pelvic fluid collection now measuring 4.3 x 3.5cm. Drain repositioned 9/20 with good output. Now output serosanguinous and minimal.  CT 9/25  with improvement of abscess.  Have discussed with IR if drain can be removed.  They will evaluate  - prolonged ileus resolving. cont soft diet.  wean TPN- half rate today then stop. - Vac MWF. Plan for home with vac  - WOCN following for new ostomy education.  Retracted stoma with mucocutaneous separation- observation for now as it is viable and function. May need revision in the future   - Mobilize, continue therapies. Currently recommended for [facility] PT/OT.  - TOC consult for [facility] PT/OT/RN   FEN - TPN, soft diet, Ensure   VTE - SCDs, Lovenox  ID - none currently   ABL anemia Hx Gastric Sleeve Feb 2023  HTN GERD    LOS: 28 days    Rosario Adie, MD  [facility]

## 2022-09-18 NOTE — Procedures (Signed)
Interventional Radiology Procedure Note  Procedure:  1) Drain check 2) Drain removal  Findings: Please refer to procedural dictation for full description.No evidence of fistula or significant residual fluid collection in communication with drain.  Drain removed uneventfully.  Complications: None immediate  Estimated Blood Loss: < 5 mL  Recommendations: Follow up with IR as needed.   Ruthann Cancer, MD

## 2022-09-18 NOTE — Discharge Instructions (Signed)
[facility]      [facility] [phone]  OPEN ABDOMINAL SURGERY: POST OP INSTRUCTIONS  Always review your discharge instruction sheet given to you by the facility where your surgery was performed.  IF YOU HAVE DISABILITY OR FAMILY LEAVE FORMS, YOU MUST BRING THEM TO THE OFFICE FOR PROCESSING.  PLEASE DO NOT GIVE THEM TO YOUR DOCTOR.  A prescription for pain medication may be given to you upon discharge.  Take your pain medication as prescribed, if needed.  If narcotic pain medicine is not needed, then you may take acetaminophen (Tylenol) or ibuprofen (Advil) as needed. Take your usually prescribed medications unless otherwise directed. If you need a refill on your pain medication, please contact your pharmacy. They will contact our office to request authorization.  Prescriptions will not be filled after 5pm or on week-ends. You should follow a light diet the first few days after arrival home, such as soup and crackers, pudding, etc.unless your doctor has advised otherwise. A high-fiber, low fat diet can be resumed as tolerated.   Be sure to include lots of fluids daily. Most patients will experience some swelling and bruising on the chest and neck area.  Ice packs will help.  Swelling and bruising can take several days to resolve Most patients will experience some swelling and bruising in the area of the incision. Ice pack will help. Swelling and bruising can take several days to resolve..  It is common to experience some constipation if taking pain medication after surgery.  Increasing fluid intake and taking a stool softener will usually help or prevent this problem from occurring.  A mild laxative (Milk of Magnesia or Miralax) should be taken according to package directions if there are no bowel movements after 48 hours.  You may have steri-strips (small skin tapes) in place directly over the incision.  These strips should be left on the skin for 7-10 days.  If your surgeon used skin  glue on the incision, you may shower in 24 hours.  The glue will flake off over the next 2-3 weeks.  Any sutures or staples will be removed at the office during your follow-up visit. You may find that a light gauze bandage over your incision may keep your staples from being rubbed or pulled. You may shower and replace the bandage daily. ACTIVITIES:  You may resume regular (light) daily activities beginning the next day--such as daily self-care, walking, climbing stairs--gradually increasing activities as tolerated.  You may have sexual intercourse when it is comfortable.  Refrain from any heavy lifting or straining until approved by your doctor. You may drive when you no longer are taking prescription pain medication, you can comfortably wear a seatbelt, and you can safely maneuver your car and apply brakes Return to Work: ___________________________________ You should see your doctor in the office for a follow-up appointment approximately two weeks after your surgery.  Make sure that you call for this appointment within a day or two after you arrive home to insure a convenient appointment time. OTHER INSTRUCTIONS:  _____________________________________________________________ _____________________________________________________________  WHEN TO CALL YOUR DOCTOR: Fever over 101.0 Inability to urinate Nausea and/or vomiting Extreme swelling or bruising Continued bleeding from incision. Increased pain, redness, or drainage from the incision. Difficulty swallowing or breathing Muscle cramping or spasms. Numbness or tingling in hands or feet or around lips.  The clinic staff is available to answer your questions during regular business hours.  Please don't hesitate to call and ask to speak to one of   the nurses if you have concerns.  For further questions, please visit [email or web address]

## 2022-09-18 NOTE — Progress Notes (Signed)
PHARMACY - TOTAL PARENTERAL NUTRITION CONSULT NOTE   Indication: Prolonged ileus  Patient Measurements: Height: 5\' 9"  (175.3 cm) Weight: 109 kg (240 lb 4.8 oz) IBW/kg (Calculated) : 66.2 TPN AdjBW (KG): 77.7 Body mass index is 35.49 kg/m.  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 Patient failed conservative measures and required colectomy with colostomy placement on 9/8. Developed post-op intra-abdominal abscess and ileus, and was started on TPN 9/14.  Glucose / Insulin: pre-DM with A1C 6 (01/2022).  - CBGs very well controlled (goal 100-150) - SSI discontinued Electrolytes: all WNL; K, Mg at goal for ileus Renal: SCr, BUN stable WNL; UOP good Hepatic: transaminases, bili, TG all stable WNL; albumin low/stable I/O:  - minimal stool in ostomy bag yesterday, no n/v - minimal drain output - MIVF: D5 1/2 NS 20 KCl at 30 ml/hr - tolerating fulls but minimal intake d/t not liking available options GI Imaging: - 9/13: CT 9/13 w/ central pelvic fluid collection measuring 6.3cm - 9/19: CT: improving size of pelvic collection  GI Surgeries / Procedures:  - 9/8: Exploratory laparotomy, sigmoid colectomy, descending colostomy, VAC dressing placement for Diverticulitis with Colonic Obstruction  - 9/14: IR drainage of fluid collection/abscess - 9/21: Abscess catheter exchanged and repositioned  Central access: PICC TPN start date: 09/06/22  Nutritional Goals: Goal TPN rate is 75 mL/hr (provides 104 g of protein and 1814 kcals per day)  RD Assessment: Estimated Needs Total Energy Estimated Needs: 1700-1900 Total Protein Estimated Needs: 95-105g Total Fluid Estimated Needs: 1.9L/day  Current Nutrition: see RD note for timeline changes - Soft diet started 9/25 - Ensure - refusing most - TPN at 75 ml/hr   Plan:  Reduce TPN to 40 mL/hr at 1800 today; plan will be  to wean fully off TPN tomorrow if continues to tolerate POs Electrolytes in TPN: no changes Na 50 mEq/L K 65 mEq/L Ca 5 mEq/L  Mg 10 mEq/L Phos 10 mmol/L  Cl:Ac 1:1 (standard) Add standard MVI and trace elements to TPN MIVF per primary   Arianni Gallego A, PharmD, BCPS Clinical Pharmacist 09/18/2022 9:03 AM

## 2022-09-18 NOTE — Progress Notes (Signed)
Patient ID: Kristin Gibson, female   DOB: 1956/10/30, 65 y.o.   MRN: [id number]    Referring Physician(s): White,C  Supervising Physician: Ruthann Cancer  Patient Status:  [facility] - In-pt  Chief Complaint: Lower abdominal pain, postop pelvic abscess   Subjective: Pt doing ok today ; she does report some continued pain in rt leg,buttocks region since pelvic drain manipulated recently, output last 12 hrs about 25 cc serosanguinous fluid   Allergies: Benazepril, Sulfa antibiotics, and Elemental sulfur  Medications: Prior to Admission medications   Medication Sig Start Date End Date Taking? Authorizing Provider  acetaminophen (TYLENOL) 500 MG tablet Take 1,000 mg by mouth daily as needed for mild pain. pain    Yes [provider]  amLODipine (NORVASC) 10 MG tablet Take 10 mg by mouth daily with supper. 08/08/17  Yes [provider]  amoxicillin (AMOXIL) 500 MG capsule Take 2,000 mg by mouth once.   Yes [provider]  amoxicillin-clavulanate (AUGMENTIN) 875-125 MG tablet Take 1 tablet by mouth 2 (two) times daily. 08/17/22  Yes [provider]  cloNIDine (CATAPRES) 0.1 MG tablet Take 0.1 mg by mouth daily with supper. 10/13/18  Yes [provider]  dicyclomine (BENTYL) 20 MG tablet Take 20 mg by mouth every 8 (eight) hours. 12/05/16  Yes [provider]  methocarbamol (ROBAXIN) 500 MG tablet Take 500 mg by mouth every 8 (eight) hours as needed for muscle spasms.   Yes [provider]  Multiple Vitamin (MULTIVITAMIN) tablet Take 1 tablet by mouth daily.   Yes [provider]  ondansetron (ZOFRAN-ODT) 4 MG disintegrating tablet Take 1 tablet (4 mg total) by mouth every 6 (six) hours as needed for nausea or vomiting. 02/20/22  Yes Clovis Riley, MD  pantoprazole (PROTONIX) 40 MG tablet Take 1 tablet (40 mg total) by mouth daily. 02/20/22  Yes Clovis Riley, MD  polyethylene glycol (MIRALAX / GLYCOLAX) 17 g packet  Take 17 g by mouth daily as needed (constipation).   Yes [provider]  Probiotic CAPS Take 1 capsule by mouth daily. 06/28/20  Yes Ward, Delice Bison, DO  tizanidine (ZANAFLEX) 2 MG capsule Take 2 mg by mouth every 8 (eight) hours as needed for muscle spasms.   Yes [provider]  traMADol (ULTRAM) 50 MG tablet Take 1 tablet (50 mg total) by mouth every 6 (six) hours as needed (pain). 02/20/22  Yes Clovis Riley, MD  gabapentin (NEURONTIN) 100 MG capsule Take 2 capsules (200 mg total) by mouth every 12 (twelve) hours. Patient not taking: Reported on 08/22/2022 02/20/22   Clovis Riley, MD     Vital Signs: BP 119/73 (BP Location: Left Arm)   Pulse 62   Temp 98.4 F (36.9 C) (Oral)   Resp 16   Ht 5\' 9"  (1.753 m)   Wt 240 lb 4.8 oz (109 kg)   SpO2 99%   BMI 35.49 kg/m   Physical Exam awake/alert; rt TG drain intact, dressing intact, mildly tender, OP 25 cc serosang fluid, drain flushed with input= output  Imaging: CT ABDOMEN PELVIS W CONTRAST  Result Date: 09/17/2022 CLINICAL DATA:  Postop abdominal pain EXAM: CT ABDOMEN AND PELVIS WITH CONTRAST TECHNIQUE: Multidetector CT imaging of the abdomen and pelvis was performed using the standard protocol following bolus administration of intravenous contrast. RADIATION DOSE REDUCTION: This exam was performed according to the departmental dose-optimization program which includes automated exposure control, adjustment of the mA and/or kV according to patient size and/or use  of iterative reconstruction technique. CONTRAST:  135mL OMNIPAQUE IOHEXOL 300 MG/ML  SOLN COMPARISON:  Status post Hartmann's pouch on 08/31/2022 and abscess drain FINDINGS: Lower chest: Small pericardial effusion, decreased in size when compared with prior exam. Small hiatal hernia. Hepatobiliary: No focal liver abnormality is seen. No gallstones, gallbladder wall thickening, or biliary dilatation. Pancreas: Unremarkable. No pancreatic ductal dilatation or  surrounding inflammatory changes. Spleen: Normal in size without focal abnormality. Adrenals/Urinary Tract: Bilateral adrenal glands are unremarkable. No hydronephrosis. Nephrolithiasis. Bilateral low-attenuation renal lesions, largest are compatible with simple cysts, others are too small to completely characterize, no specific follow-up imaging is recommended. Stomach/Bowel: Prior sigmoidectomy with left lower quadrant colostomy and Hartmann's pouch. Pigtail drainage catheter is seen in the pelvis with trace residual air and fluid collection measuring 13 x 4 mm on series 5, image 61. Status post gastric bypass. Decreased mild dilation of small bowel loops, likely due to resolving ileus. Vascular/Lymphatic: Mildaortic atherosclerosis. No enlarged abdominal or pelvic lymph nodes. Reproductive: No adnexal mass. Other: Postsurgical changes of the anterior abdominal wall. Mild mesenteric soft tissue stranding, most pronounced in the left hemiabdomen, likely postsurgical. Musculoskeletal: No acute or significant osseous findings. IMPRESSION: 1. Postsurgical changes from sigmoidectomy and lower quadrant colostomy. Pigtail drainage catheter is seen in the pelvis with trace residual air and fluid collection which is significantly smaller when compared to prior exam. 2. Decreased size of small pericardial effusion interval resolution of left pleural effusion. Electronically Signed   By: Yetta Glassman M.D.   On: 09/17/2022 14:17    Labs:  CBC: Recent Labs    09/06/22 0401 09/07/22 0416 09/11/22 0402 09/13/22 0411  WBC 9.7 9.7 8.3 7.5  HGB 11.1* 10.3* 8.8* 9.1*  HCT 33.5* 31.1* 28.3* 28.2*  PLT 367 338 335 362    COAGS: Recent Labs    09/06/22 0401  INR 1.3*    BMP: Recent Labs    09/15/22 0326 09/16/22 0339 09/17/22 0438 09/18/22 0416  NA 138 136 136 136  K 3.9 3.7 4.1 4.3  CL 105 103 105 107  CO2 26 26 24 23   GLUCOSE 113* 109* 103* 109*  BUN 13 13 15 21   CALCIUM 8.9 8.6* 8.9 8.9   CREATININE 0.54 0.50 0.46 0.60  GFRNONAA >60 >60 >60 >60    LIVER FUNCTION TESTS: Recent Labs    09/08/22 0331 09/10/22 0511 09/13/22 0411 09/17/22 0438  BILITOT 0.5 0.5 0.5 0.5  AST 17 15 17 19   ALT 10 10 12 13   ALKPHOS 54 59 72 84  PROT 5.8* 6.5 6.4* 6.9  ALBUMIN 2.2* 2.4* 2.3* 2.5*    Assessment and Plan: Pt with hx sigmoid diverticulitis with prior exploratory laparotomy, sigmoid colectomy, descending colostomy with Dr. Harlow Asa on 08/31/2022; now with postop pelvic abscess, s/p rt TG drain placement 9/14 (12 fr to JP); s/p repositioning of drain on 9/20; drain fl cx- enterococcus; afebrile, creat nl; with pt having some new discomfort going down RLE following recent drain manipulation will plan drain injection today to r/o fistula and potentially remove drain if possible- d/w pt   Electronically Signed: D. Rowe Robert, PA-C 09/18/2022, 9:25 AM   I spent a total of 15 Minutes at the the patient's bedside AND on the patient's hospital floor or unit, greater than 50% of which was counseling/coordinating care for pelvic abscess drain

## 2022-09-19 ENCOUNTER — Inpatient Hospital Stay (HOSPITAL_COMMUNITY): Payer: Medicare HMO

## 2022-09-19 DIAGNOSIS — I82403 Acute embolism and thrombosis of unspecified deep veins of lower extremity, bilateral: Secondary | ICD-10-CM

## 2022-09-19 DIAGNOSIS — M79605 Pain in left leg: Secondary | ICD-10-CM

## 2022-09-19 DIAGNOSIS — M79604 Pain in right leg: Secondary | ICD-10-CM

## 2022-09-19 MED ORDER — GABAPENTIN 400 MG PO CAPS
400.0000 mg | ORAL_CAPSULE | Freq: Three times a day (TID) | ORAL | Status: DC
  Administered 2022-09-19 – 2022-09-20 (×4): 400 mg via ORAL
  Filled 2022-09-19 (×4): qty 1

## 2022-09-19 MED ORDER — APIXABAN 5 MG PO TABS
10.0000 mg | ORAL_TABLET | Freq: Two times a day (BID) | ORAL | Status: DC
  Administered 2022-09-19 – 2022-09-20 (×2): 10 mg via ORAL
  Filled 2022-09-19 (×2): qty 2

## 2022-09-19 MED ORDER — MORPHINE SULFATE (PF) 2 MG/ML IV SOLN
2.0000 mg | INTRAVENOUS | Status: DC | PRN
  Administered 2022-09-19: 2 mg via INTRAVENOUS
  Filled 2022-09-19: qty 1

## 2022-09-19 MED ORDER — APIXABAN 5 MG PO TABS: 5.0000 mg | ORAL_TABLET | Freq: Two times a day (BID) | ORAL | Status: DC

## 2022-09-19 MED ORDER — METHOCARBAMOL 500 MG PO TABS
1000.0000 mg | ORAL_TABLET | Freq: Three times a day (TID) | ORAL | Status: DC
  Administered 2022-09-19 – 2022-09-20 (×4): 1000 mg via ORAL
  Filled 2022-09-19 (×4): qty 2

## 2022-09-19 NOTE — TOC Transition Note (Signed)
Transition of Care [facility]) - CM/SW Discharge Note   Patient Details  Name: Kristin Gibson MRN: [id number] Date of Birth: [birth year]  Transition of Care [facility]) CM/SW Contact:  Roseanne Kaufman, RN Phone Number: 09/19/2022, 9:27 AM   Clinical Narrative:   Home health services are able to start on Friday 09/21/22 per Tommi Rumps with Al[facility] Lemmings, notified PA.   TOC will continue to follow for wound vac services and discharge planning.    Final next level of care: [place] Barriers to Discharge: Continued Medical Work up   Patient Goals and CMS Choice Patient states their goals for this hospitalization and ongoing recovery are:: return home with home health services CMS Medicare.gov Compare Post Acute Care list provided to:: Patient Choice offered to / list presented to : Patient  Discharge Placement                       Discharge Plan and Services In-house Referral: NA Discharge Planning Services: CM Consult Post Acute Care Choice: Home Health [facility])          DME Arranged: Other see comment, Negative pressure wound device (raised commode seat) DME Agency: [employer], [employer] Date DME Agency Contacted: 09/03/22 Time DME Agency Contacted: [phone] Representative spoke with at DME Agency: [place]: OT, RN, PT [facility] Agency: [place] Date [place]: 09/03/22 Time [place]: [id number] Representative spoke with at [place]: [place] ([place]) Interventions     Readmission Risk Interventions    09/03/2022    3:44 PM  Readmission Risk Prevention Plan  PCP or Specialist Appt within 5-7 Days Complete  Home Care Screening Complete  Medication Review (RN CM) Complete

## 2022-09-19 NOTE — Consult Note (Signed)
[place] Nurse wound follow up Wound type:midline abdominal wound with NPWT (VAC) dressing in place.   Measurement: 14 cm x 4 cm x 2 cm  Wound YWX:IPPND red Drainage (amount, consistency, odor) minimal serosanguinous  no odor.  Drainage has decreased.  Periwound:LLQ colostomy with separation present circumferentially Dressing procedure/placement/frequency: Cleanse midline wound  1piece black foam to wound bed. Change Mon/Wed/Fri.  PA anticipates discharge tomorrow or Friday.  [place] Nurse ostomy follow up Stoma type/location: LUQ colostomy with circumferential mucocutaneous separation Stomal assessment/size: 1 1/2" pink and moist  Peristomal assessment: separation of 1 cm present circumferentially  opening is oval   Treatment options for stomal/peristomal skin: barrier ring, stoma powder in separation. Output  soft brown stool Ostomy pouching: 1pc.convex pouch with barrier ring and stoma powder Education provided:  Patient anticipating discharge with HH.   Enrolled patient in [place] Start Discharge program: Yes  Kit has arrived at home, per patient.  Referral has been made to outpatient ostomy clinic after discharge.  Will not follow at this time.  Please re-consult if needed.  Domenic Moras MSN, RN, FNP-BC CWON Wound, Ostomy, Continence Nurse Pager [phone]

## 2022-09-19 NOTE — Progress Notes (Signed)
Bilateral lower extremity venous duplex has been completed. Preliminary results can be found in CV Proc through chart review.  Results were given to the patient's nurse, Brooke.  09/19/22 10:08 AM Carlos Levering RVT

## 2022-09-19 NOTE — Care Management (Deleted)
    Durable Medical Equipment  (From admission, onward)           Start     Ordered   09/19/22 1232  For home use only DME Hospital bed  Once       Question Answer Comment  Length of Need 6 Months   Patient has (list medical condition): s/p Exploratory laparotomy, sigmoid colectomy, descending colostomy, VAC dressing placement for Diverticulitis with Colonic Obstruction 08/31/22   The above medical condition requires: Patient requires the ability to reposition frequently   Bed type Semi-electric      09/19/22 1232   09/17/22 0936  For home use only DME Vac  Once        09/17/22 0935

## 2022-09-19 NOTE — Progress Notes (Signed)
[facility] Progress Note  19 Days Post-Op  Subjective: CC-  Main complaint this morning is right calf pain. States that soreness/tightness was worse over night. Morphine helped some.  Doing fairly well from abdominal standpoint. Tolerating more to eat. No n/v. Colostomy productive. She is having some issues with colostomy pouch leaking.  Objective: Vital signs in last 24 hours: Temp:  [98.4 F (36.9 C)-98.8 F (37.1 C)] 98.6 F (37 C) (09/27 0527) Pulse Rate:  [69-87] 69 (09/27 0527) Resp:  [17-20] 18 (09/27 0527) BP: (107-134)/(66-92) 110/73 (09/27 0527) SpO2:  [99 %] 99 % (09/27 0527) Last BM Date : 09/18/22  Intake/Output from previous day: 09/26 0701 - 09/27 0700 In: 2509.4 [P.O.:600; I.V.:1489.4; IV Piggyback:420] Out: 2275 [Urine:2150; Drains:50; Stool:75] Intake/Output this shift: No intake/output data recorded.  PE: Gen:  Alert, NAD, pleasant Pulm: rate and effort normal Abd: Soft, nontender, vac to midline wound with good seal. Unable to visualize ostomy well through pouch, stool in bag  Lab Results:  No results for input(s): "WBC", "HGB", "HCT", "PLT" in the last 72 hours. BMET Recent Labs    09/17/22 0438 09/18/22 0416  NA 136 136  K 4.1 4.3  CL 105 107  CO2 24 23  GLUCOSE 103* 109*  BUN 15 21  CREATININE 0.46 0.60  CALCIUM 8.9 8.9   PT/INR No results for input(s): "LABPROT", "INR" in the last 72 hours. CMP     Component Value Date/Time   NA 136 09/18/2022 0416   K 4.3 09/18/2022 0416   CL 107 09/18/2022 0416   CO2 23 09/18/2022 0416   GLUCOSE 109 (H) 09/18/2022 0416   BUN 21 09/18/2022 0416   CREATININE 0.60 09/18/2022 0416   CALCIUM 8.9 09/18/2022 0416   PROT 6.9 09/17/2022 0438   ALBUMIN 2.5 (L) 09/17/2022 0438   AST 19 09/17/2022 0438   ALT 13 09/17/2022 0438   ALKPHOS 84 09/17/2022 0438   BILITOT 0.5 09/17/2022 0438   GFRNONAA >60 09/18/2022 0416   GFRAA >60 06/28/2020 0548   Lipase     Component Value Date/Time    LIPASE 26 08/21/2022 2306       Studies/Results: CT ABDOMEN PELVIS W CONTRAST  Result Date: 09/17/2022 CLINICAL DATA:  Postop abdominal pain EXAM: CT ABDOMEN AND PELVIS WITH CONTRAST TECHNIQUE: Multidetector CT imaging of the abdomen and pelvis was performed using the standard protocol following bolus administration of intravenous contrast. RADIATION DOSE REDUCTION: This exam was performed according to the departmental dose-optimization program which includes automated exposure control, adjustment of the mA and/or kV according to patient size and/or use of iterative reconstruction technique. CONTRAST:  OMNIPAQUE IOHEXOL 300 MG/ML  SOLN COMPARISON:  Status post Hartmann's pouch on 08/31/2022 and abscess drain FINDINGS: Lower chest: Small pericardial effusion, decreased in size when compared with prior exam. Small hiatal hernia. Hepatobiliary: No focal liver abnormality is seen. No gallstones, gallbladder wall thickening, or biliary dilatation. Pancreas: Unremarkable. No pancreatic ductal dilatation or surrounding inflammatory changes. Spleen: Normal in size without focal abnormality. Adrenals/Urinary Tract: Bilateral adrenal glands are unremarkable. No hydronephrosis. Nephrolithiasis. Bilateral low-attenuation renal lesions, largest are compatible with simple cysts, others are too small to completely characterize, no specific follow-up imaging is recommended. Stomach/Bowel: Prior sigmoidectomy with left lower quadrant colostomy and Hartmann's pouch. Pigtail drainage catheter is seen in the pelvis with trace residual air and fluid collection measuring 13 x 4 mm on series 5, image 61. Status post gastric bypass. Decreased mild dilation of small bowel loops, likely due  to resolving ileus. Vascular/Lymphatic: Mildaortic atherosclerosis. No enlarged abdominal or pelvic lymph nodes. Reproductive: No adnexal mass. Other: Postsurgical changes of the anterior abdominal wall. Mild mesenteric soft tissue  stranding, most pronounced in the left hemiabdomen, likely postsurgical. Musculoskeletal: No acute or significant osseous findings. IMPRESSION: 1. Postsurgical changes from sigmoidectomy and lower quadrant colostomy. Pigtail drainage catheter is seen in the pelvis with trace residual air and fluid collection which is significantly smaller when compared to prior exam. 2. Decreased size of small pericardial effusion interval resolution of left pleural effusion. Electronically Signed   By: Allegra Lai M.D.   On: 09/17/2022 14:17    Anti-infectives: Anti-infectives (From admission, onward)    Start     Dose/Rate Route Frequency Ordered Stop   09/10/22 0600  vancomycin (VANCOCIN) IVPB 1000 mg/200 mL premix        1,000 mg 200 mL/hr over 60 Minutes Intravenous Every 12 hours 09/09/22 1620 09/14/22 2040   09/09/22 1715  vancomycin (VANCOREADY) IVPB 2000 mg/400 mL        2,000 mg 200 mL/hr over 120 Minutes Intravenous  Once 09/09/22 1620 09/09/22 1955   09/06/22 1400  piperacillin-tazobactam (ZOSYN) IVPB 3.375 g  Status:  Discontinued        3.375 g 12.5 mL/hr over 240 Minutes Intravenous Every 8 hours 09/06/22 1302 09/11/22 0839   09/02/22 1400  ceFEPIme (MAXIPIME) 2 g in sodium chloride 0.9 % 100 mL IVPB        2 g 200 mL/hr over 30 Minutes Intravenous Every 8 hours 09/02/22 1015 09/05/22 0103   08/31/22 0600  cefoTEtan (CEFOTAN) 2 g in sodium chloride 0.9 % 100 mL IVPB  Status:  Discontinued        2 g 200 mL/hr over 30 Minutes Intravenous On call to O.R. 08/30/22 1316 08/31/22 1505   08/26/22 1400  ceFEPIme (MAXIPIME) 2 g in sodium chloride 0.9 % 100 mL IVPB  Status:  Discontinued        2 g 200 mL/hr over 30 Minutes Intravenous Every 8 hours 08/26/22 0848 09/02/22 1015   08/23/22 0400  cefTRIAXone (ROCEPHIN) 2 g in sodium chloride 0.9 % 100 mL IVPB  Status:  Discontinued        2 g 200 mL/hr over 30 Minutes Intravenous Every 24 hours 08/22/22 0345 08/26/22 0848   08/22/22 1400   metroNIDAZOLE (FLAGYL) IVPB 500 mg        500 mg 100 mL/hr over 60 Minutes Intravenous Every 12 hours 08/22/22 0345 09/05/22 0449   08/22/22 0100  cefTRIAXone (ROCEPHIN) 2 g in sodium chloride 0.9 % 100 mL IVPB        2 g 200 mL/hr over 30 Minutes Intravenous  Once 08/22/22 0047 08/22/22 0226   08/22/22 0100  metroNIDAZOLE (FLAGYL) IVPB 500 mg        500 mg 100 mL/hr over 60 Minutes Intravenous  Once 08/22/22 0047 08/22/22 0412        Assessment/Plan POD 18 s/p Exploratory laparotomy, sigmoid colectomy, descending colostomy, VAC dressing placement for Diverticulitis with Colonic Obstruction by Dr. Gerrit Friends on 08/31/22 - Path benign  - CT 9/13 w/ central pelvic fluid collection measuring 6.3 x 4.6cm. S/p IR drainage 9/14, Cx- enterococcus faecium only sensitive to Vanc. Changed to Vanc 9/18, plan 5d course. Repeat CT 9/19 w/ decrease in size of pelvic fluid collection now measuring 4.3 x 3.5cm. Drain repositioned 9/20 with good output. Now output serosanguinous and minimal.  CT 9/25 with improvement of abscess.  IR drain injection 9/26 revealed no fistula therefore drain was removed - tolerating diet and ostomy productive. Continue soft diet, will d/c TPN after today's bag is complete - Vac MWF. Plan for home with vac  - WOCN following for new ostomy education - I will come back and see the patient again with WOC changes vac/ ostomy appliance - Retracted stoma with mucocutaneous separation- observation for now as it is viable and function. May need revision in the future   - Mobilize, continue therapies. Currently recommended for [facility] PT/OT, this has been arranged by TOC   FEN - 1/2 TPN, soft diet, Ensure   VTE - SCDs, Lovenox  ID - none currently   Right calf pain - will check u/s to rule out DVT. If negative may be neuropathic from IR drain. Increase gabapentin to 400mg  TID ABL anemia Hx Gastric Sleeve Feb 2023  HTN GERD    LOS: 28 days    Wellington [place], [facility]  Surgery 09/19/2022, 9:10 AM Please see Amion for pager number during day hours 7:00am-4:30pm

## 2022-09-19 NOTE — TOC Progression Note (Signed)
Transition of Care [facility]) - Progression Note    Patient Details  Name: Kristin Gibson MRN: [id number] Date of Birth: 1956-10-18  [facility]) CM/SW Contact  Safir Michalec, Juliann Pulse, RN Phone Number: 09/19/2022, 10:26 AM  Clinical Narrative:    I spoke to [name](dtr) about d/c plans-I will add [place] aide to [facility] services-[place] HHRN/PT/OT/aide(intermittent services);;dtr will get own private duty care services, & own hospital bed since it was not recc by PT.[employer] will talk to her about co pay for wound vac.Address @ d/c:[address]. Family has own transport home.   Expected Discharge Plan: [place] Barriers to Discharge: Continued Medical Work up  Expected Discharge Plan and Services Expected Discharge Plan: [place] In-house Referral: NA Discharge Planning Services: CM Consult Post Acute Care Choice: [place] [facility]) Living arrangements for the past 2 months: Apartment                 DME Arranged:  (wound vac) DME Agency: [employer] Date DME Agency Contacted: 09/19/22 Time DME Agency Contacted: 67 Representative spoke with at DME Agency: [place]: RN, PT, OT, Nurse's Aide [place] Agency: [place] Date [place]: 09/19/22 Time [place]: 1026 Representative spoke with at [place]: [place] ([place]) Interventions    Readmission Risk Interventions    09/03/2022    3:44 PM  Readmission Risk Prevention Plan  PCP or Specialist Appt within 5-7 Days Complete  Home Care Screening Complete  Medication Review (RN CM) Complete

## 2022-09-19 NOTE — Progress Notes (Signed)
Patient recently underwent Exploratory laparotomy, sigmoid colectomy, descending colostomy, VAC dressing placement for Diverticulitis with Colonic Obstruction on 08/31/22, which impairs their ability to perform daily activities like toileting and mobilizing in the home. She has difficulty getting in and out of bed. A hospital bed will allow patient to safely perform daily activities, and allow him to be positioned in ways not feasible with a normal bed.  Pain episodes frequently require immediate changes in body position which cannot be achieved with a normal bed.    Kristin Gibson, [facility] 09/19/2022, 12:33 PM Please see Amion for pager number during day hours 7:00am-4:30pm

## 2022-09-19 NOTE — TOC Progression Note (Signed)
Transition of Care [facility]) - Progression Note    Patient Details  Name: Kristin Gibson MRN: [id number] Date of Birth: [birth year]  Transition of Care [facility]) CM/SW Contact  Wendle Kina, Juliann Pulse, RN Phone Number: 09/19/2022, 2:45 PM  Clinical Narrative: [employer] rep Danielle following on hospital bed for delivery address in prior TOC progress note.    Expected Discharge Plan: [place] Barriers to Discharge: Continued Medical Work up  Expected Discharge Plan and Services Expected Discharge Plan: [place] In-house Referral: NA Discharge Planning Services: CM Consult Post Acute Care Choice: [place] [facility] Living arrangements for the past 2 months: Apartment                 DME Arranged: Hospital bed DME Agency: [employer] Date DME Agency Contacted: 09/19/22 Time DME Agency Contacted: [id number] Representative spoke with at DME Agency: Andee Poles HH Arranged: RN, PT, OT, Nurse's Aide [place] Agency: [place] Date [place]: 09/19/22 Time [place]: 1026 Representative spoke with at [place]: [place] ([place]) Interventions    Readmission Risk Interventions    09/03/2022    3:44 PM  Readmission Risk Prevention Plan  PCP or Specialist Appt within 5-7 Days Complete  Home Care Screening Complete  Medication Review (RN CM) Complete

## 2022-09-19 NOTE — Progress Notes (Signed)
PHARMACY - TOTAL PARENTERAL NUTRITION CONSULT NOTE   Indication: Prolonged ileus  Patient Measurements: Height: 5' 9.02" (175.3 cm) Weight: 109 kg (240 lb 4.8 oz) IBW/kg (Calculated) : 66.24 TPN AdjBW (KG): 76.9 Body mass index is 35.47 kg/m.  Assessment:  56 yoF with PMH gastric sleeve resection in Feb, recurrent diverticulitis, admitted with abd pain, fever/chills; found to have sigmoid diverticulitis. Patient failed conservative measures and required colectomy with colostomy placement on 9/8. Developed post-op intra-abdominal abscess and ileus, and was started on TPN 9/14.  Glucose / Insulin: pre-DM with A1C 6 (01/2022).  - CBGs very well controlled (goal 100-150) - SSI discontinued Electrolytes: all WNL; K, Mg at goal for ileus Renal: SCr, BUN stable WNL; UOP good Hepatic: transaminases, bili, TG all stable WNL; albumin low/stable GI Imaging: - 9/13: CT 9/13 w/ central pelvic fluid collection measuring 6.3cm - 9/19: CT: improving size of pelvic collection  GI Surgeries / Procedures:  - 9/8: Exploratory laparotomy, sigmoid colectomy, descending colostomy, VAC dressing placement for Diverticulitis with Colonic Obstruction  - 9/14: IR drainage of fluid collection/abscess - 9/21: Abscess catheter exchanged and repositioned  Central access: PICC TPN start date: 09/06/22  Nutritional Goals: Goal TPN rate is 75 mL/hr (provides 104 g of protein and 1814 kcals per day)  RD Assessment: Estimated Needs Total Energy Estimated Needs: 1700-1900 Total Protein Estimated Needs: 95-105g Total Fluid Estimated Needs: 1.9L/day  Current Nutrition: see RD note for timeline changes - Soft diet started 9/25 - Ensure - refusing most   Plan:  Patient tolerating oral intake better today TPN reduced yesterday, discontinue TPN after today's bag per [place], PharmD, BCPS Clinical Pharmacist 09/19/2022 9:53 AM

## 2022-09-19 NOTE — Care Management (Signed)
    Durable Medical Equipment  (From admission, onward)           Start     Ordered   09/19/22 1232  For home use only DME Hospital bed  Once       Question Answer Comment  Length of Need 6 Months   Patient has (list medical condition): s/p Exploratory laparotomy, sigmoid colectomy, descending colostomy, VAC dressing placement for Diverticulitis with Colonic Obstruction 08/31/22   The above medical condition requires: Patient requires the ability to reposition frequently   Bed type Semi-electric      09/19/22 1232   09/17/22 0936  For home use only DME Vac  Once        09/17/22 0935            

## 2022-09-20 ENCOUNTER — Other Ambulatory Visit (HOSPITAL_COMMUNITY): Payer: Self-pay

## 2022-09-20 MED ORDER — NYSTATIN 100000 UNIT/ML MT SUSP: 5.0000 mL | Freq: Four times a day (QID) | OROMUCOSAL | 0 refills | Status: DC | PRN

## 2022-09-20 MED ORDER — FLUCONAZOLE 150 MG PO TABS: 150.0000 mg | ORAL_TABLET | Freq: Every day | ORAL | 0 refills | Status: DC | PRN

## 2022-09-20 MED ORDER — APIXABAN 5 MG PO TABS: 10.0000 mg | ORAL_TABLET | Freq: Two times a day (BID) | ORAL | 0 refills | Status: DC

## 2022-09-20 MED ORDER — METHOCARBAMOL 500 MG PO TABS: 1000.0000 mg | ORAL_TABLET | Freq: Three times a day (TID) | ORAL | 1 refills | Status: DC | PRN

## 2022-09-20 MED ORDER — OXYCODONE HCL 10 MG PO TABS: 5.0000 mg | ORAL_TABLET | Freq: Four times a day (QID) | ORAL | 0 refills | Status: DC | PRN

## 2022-09-20 MED ORDER — APIXABAN 5 MG PO TABS: 5.0000 mg | ORAL_TABLET | Freq: Two times a day (BID) | ORAL | 0 refills | Status: DC

## 2022-09-20 NOTE — TOC Transition Note (Addendum)
Transition of Care [facility]) - CM/SW Discharge Note   Patient Details  Name: Kristin Gibson MRN: [id number] Date of Birth: 02-13-1956  Transition of Care [facility]) CM/SW Contact:  Dessa Phi, RN Phone Number: 09/20/2022, 10:35 AM   Clinical Narrative:  Awaiting confirmation from [place] on auth for wound vac prior d/c home. [employer] rep Tommi Rumps already aware of d/c today pending auth for wound vac. [employer] rep Andee Poles has already delivered hospital bed to home. Spoke to dtr Lafonda Mosses father is having surgery today.She is aware of pending wound vac auth. Has own transport home.MD updated. -1:59p  the wound vac has been authorized for release per [place] they are delivering to the hospital asap(I do not know the time)-Nsg please once its there place wound vac on or contact the wound care nurse to place on.No further CM needs.    Final next level of care: [place] Barriers to Discharge: No Barriers Identified   Patient Goals and CMS Choice Patient states their goals for this hospitalization and ongoing recovery are:: return home with home health services CMS Medicare.gov Compare Post Acute Care list provided to:: Patient Choice offered to / list presented to : Patient  Discharge Placement                       Discharge Plan and Services In-house Referral: NA Discharge Planning Services: CM Consult Post Acute Care Choice: Home Health [facility])          DME Arranged: Hospital bed DME Agency: [employer] Date DME Agency Contacted: 09/19/22 Time DME Agency Contacted: [phone] Representative spoke with at DME Agency: Andee Poles HH Arranged: RN, PT, OT, Nurse's Aide [place] Agency: [place] Date [place]: 09/19/22 Time [place]: 1026 Representative spoke with at [place]: [place] ([place]) Interventions     Readmission Risk Interventions    09/03/2022    3:44 PM  Readmission Risk  Prevention Plan  PCP or Specialist Appt within 5-7 Days Complete  Home Care Screening Complete  Medication Review (RN CM) Complete

## 2022-09-20 NOTE — Progress Notes (Signed)
Physical Therapy Treatment Patient Details Name: Kristin Gibson MRN: [id number] DOB: November 23, 1956 Today's Date: 09/20/2022   History of Present Illness 66 year old female with history of gastric sleeve surgery for weight loss February 2023, recurrent diverticulitis, recently completed 2 rounds of antibiotics presented with worsening abdominal pain with fever and chills. She was admitted with sigmoid diverticulitis and started on iv antibiotics. General surgery was consulted.  Patient underwent a exploratory laparotomy and colostomy on 08/31/2022. Pt s/p Right TG drain placement 9/14    PT Comments    Pt progressing well and hopes to D/C to home later today.  Assisted OOB to amb in hallway went well. Pt plans to D/C back home.    Recommendations for follow up therapy are one component of a multi-disciplinary discharge planning process, led by the attending physician.  Recommendations may be updated based on patient status, additional functional criteria and insurance authorization.  Follow Up Recommendations  Home health PT     Assistance Recommended at Discharge Intermittent Supervision/Assistance  Patient can return home with the following A little help with walking and/or transfers;A little help with bathing/dressing/bathroom;Assistance with cooking/housework;Assist for transportation;Help with stairs or ramp for entrance   Equipment Recommendations  None recommended by PT    Recommendations for Other Services       Precautions / Restrictions Precautions Precaution Comments: colostomy, VAC Restrictions Weight Bearing Restrictions: No     Mobility  Bed Mobility Overal bed mobility: Needs Assistance Bed Mobility: Supine to Sit     Supine to sit: Supervision, Min guard     General bed mobility comments: pt self able with increased time and use of rail.    Transfers Overall transfer level: Needs assistance Equipment used: Rolling walker (2 wheels) Transfers: Sit  to/from Stand Sit to Stand: Supervision           General transfer comment: good use B UE's to support self.  Good safety cognition.    Ambulation/Gait Ambulation/Gait assistance: Supervision Gait Distance (Feet): 185 Feet Assistive device: Rolling walker (2 wheels) Gait Pattern/deviations: Step-through pattern, Decreased stride length Gait velocity: decr     General Gait Details: tolerated a functional distance with light lean on walker.   Stairs             Wheelchair Mobility    Modified Rankin (Stroke Patients Only)       Balance                                            Cognition Arousal/Alertness: Awake/alert Behavior During Therapy: WFL for tasks assessed/performed Overall Cognitive Status: Within Functional Limits for tasks assessed                                 General Comments: AxO x 3 very pleasant        Exercises      General Comments        Pertinent Vitals/Pain Pain Assessment Pain Assessment: Faces Faces Pain Scale: Hurts a little bit Pain Location: RLQ  ABD Pain Descriptors / Indicators: Discomfort, Aching Pain Intervention(s): Monitored during session, Repositioned    Home Living                          Prior Function  PT Goals (current goals can now be found in the care plan section) Progress towards PT goals: Progressing toward goals    Frequency    Min 3X/week      PT Plan Current plan remains appropriate    Co-evaluation              AM-PAC PT "6 Clicks" Mobility   Outcome Measure  Help needed turning from your back to your side while in a flat bed without using bedrails?: A Little Help needed moving from lying on your back to sitting on the side of a flat bed without using bedrails?: A Little Help needed moving to and from a bed to a chair (including a wheelchair)?: A Little Help needed standing up from a chair using your arms (e.g.,  wheelchair or bedside chair)?: A Little Help needed to walk in hospital room?: A Little Help needed climbing 3-5 steps with a railing? : A Little 6 Click Score: 18    End of Session Equipment Utilized During Treatment: Gait belt Activity Tolerance: Patient tolerated treatment well Patient left: in chair;with call bell/phone within reach;with chair alarm set Nurse Communication: Mobility status PT Visit Diagnosis: Difficulty in walking, not elsewhere classified (R26.2)     Time: 9798-9211 PT Time Calculation (min) (ACUTE ONLY): 14 min  Charges:  $Gait Training: 8-22 mins                     Rica Koyanagi  PTA [place] Office M-F          [phone] Weekend pager [phone]

## 2022-09-20 NOTE — Plan of Care (Signed)
?  Problem: Clinical Measurements: ?Goal: Will remain free from infection ?Outcome: Progressing ?  ?

## 2022-09-20 NOTE — Discharge Summary (Signed)
[facility] Discharge Summary   Patient ID: Kristin Gibson MRN: [id number] DOB/AGE: Jun 07, 1956 66 y.o.  Admit date: 08/21/2022 Discharge date: 09/20/2022  Admitting Diagnosis: Smoldering sigmoid diverticulitis  Discharge Diagnosis Diverticulitis with colonic obstruction Pelvic abscess BLE DVTs ABL anemia Hx Gastric Sleeve Feb 2023  HTN GERD  Consultants Interventional radiology  Imaging: VAS [place] LOWER EXTREMITY VENOUS (DVT)  Result Date: 09/19/2022  Lower Venous DVT Study Patient Name:  Kristin Gibson  Date of Exam:   09/19/2022 Medical Rec #: [id number]             Accession #:    [id number] Date of Birth: August 20, 1956             Patient Gender: F Patient Age:   61 years Exam Location:  [facility] Procedure:      VAS [place] LOWER EXTREMITY VENOUS (DVT) Referring Phys: Carlena Bjornstad --------------------------------------------------------------------------------  Indications: Pain.  Risk Factors: None identified. Limitations: Body habitus and poor ultrasound/tissue interface. Comparison Study: No prior studies. Performing Technologist: Chanda Busing RVT  Examination Guidelines: A complete evaluation includes B-mode imaging, spectral Doppler, color Doppler, and power Doppler as needed of all accessible portions of each vessel. Bilateral testing is considered an integral part of a complete examination. Limited examinations for reoccurring indications may be performed as noted. The reflux portion of the exam is performed with the patient in reverse Trendelenburg.  +---------+---------------+---------+-----------+----------+--------------+ RIGHT    CompressibilityPhasicitySpontaneityPropertiesThrombus Aging +---------+---------------+---------+-----------+----------+--------------+ CFV      Full           Yes      Yes                                 +---------+---------------+---------+-----------+----------+--------------+ SFJ      Full                                                         +---------+---------------+---------+-----------+----------+--------------+ FV Prox  Full                                                        +---------+---------------+---------+-----------+----------+--------------+ FV Mid   Full                                                        +---------+---------------+---------+-----------+----------+--------------+ FV DistalFull                                                        +---------+---------------+---------+-----------+----------+--------------+ PFV      Full                                                        +---------+---------------+---------+-----------+----------+--------------+  POP      Full           Yes      Yes                                 +---------+---------------+---------+-----------+----------+--------------+ PTV      Full                                                        +---------+---------------+---------+-----------+----------+--------------+ PERO     Partial                                      Acute          +---------+---------------+---------+-----------+----------+--------------+   +---------+---------------+---------+-----------+----------+--------------+ LEFT     CompressibilityPhasicitySpontaneityPropertiesThrombus Aging +---------+---------------+---------+-----------+----------+--------------+ CFV      Full           Yes      Yes                                 +---------+---------------+---------+-----------+----------+--------------+ SFJ      Full                                                        +---------+---------------+---------+-----------+----------+--------------+ FV Prox  Full                                                        +---------+---------------+---------+-----------+----------+--------------+ FV Mid   Full                                                         +---------+---------------+---------+-----------+----------+--------------+ FV Distal               Yes      Yes                                 +---------+---------------+---------+-----------+----------+--------------+ PFV      Full                                                        +---------+---------------+---------+-----------+----------+--------------+ POP      Full           Yes      Yes                                 +---------+---------------+---------+-----------+----------+--------------+  PTV      Full                                                        +---------+---------------+---------+-----------+----------+--------------+ PERO     Full                                                        +---------+---------------+---------+-----------+----------+--------------+ Gastroc  Partial                                      Acute          +---------+---------------+---------+-----------+----------+--------------+     Summary: RIGHT: - Findings consistent with acute deep vein thrombosis involving the right peroneal veins. - No cystic structure found in the popliteal fossa.  LEFT: - Findings consistent with acute deep vein thrombosis involving the left gastrocnemius veins. - No cystic structure found in the popliteal fossa.  *See table(s) above for measurements and observations. Electronically signed by Harold Barban MD on 09/19/2022 at 9:07:37 PM.    Final    IR Sinus/Fist Tube Chk-Non GI  Result Date: 09/19/2022 CLINICAL DATA:  66 year old female with history of pelvic abscess status post percutaneous drain placement on 09/06/2022 and repositioning on 09/12/2022. The patient is now experiencing significant lower extremity and pelvic pain with decreased output from the drain. There is trace residual fluid collection on CT. EXAM: SINUS TRACT INJECTION/FISTULOGRAM COMPARISON:  09/17/2022, 09/12/2022, 09/06/2022, 09/05/2022 CONTRAST:  10 mL Omnipaque  300-administered via the existing percutaneous drain. FLUOROSCOPY TIME:  Ten mGy TECHNIQUE: The patient was positioned prone on the fluoroscopy table. A preprocedural spot fluoroscopic image was obtained of the pelvis and the existing percutaneous drainage catheter. Multiple spot fluoroscopic and radiographic images were obtained following the injection of a small amount of contrast via the existing percutaneous drainage catheter. External portion of the catheter was then cut to release the pigtail. The drain was removed over a wire. No evidence of hemorrhage. A sterile bandage was applied. FINDINGS: No significant residual pelvic fluid collection and proximity with the indwelling drain. No evidence of enteric fistula. The drain was successfully removed. IMPRESSION: No significant residual pelvic fluid collection and proximity with the indwelling drain. No evidence of enteric fistula. The drain was successfully removed. Ruthann Cancer, MD [facility] Electronically Signed   By: Ruthann Cancer M.D.   On: 09/19/2022 09:21    Procedures Dr. Harlow Asa (08/31/2022) - Exploratory laparotomy, sigmoid colectomy, descending colostomy, VAC dressing placement Dr. Maryelizabeth Kaufmann (09/06/2022) - DRAINAGE CATHETER PLACEMENT into PELVIC ABSCESS Dr. Serafina Royals (09/18/22) - Drain check, Drain removal  Hospital Course:  Kristin Gibson is a 66 y.o. female who presented to [facility] 8/30 with worsening abdominal pain. She has a long history of diverticulitis, including two recent outpatient courses of oral antibiotics without improvement. Initial CT scan showed interval worsening of sigmoid diverticulitis, no free air or abscess. Patient was admitted for bowel rest and IV antibiotics. She did not improve and was therefore taken to the OR 9/8 for Exploratory laparotomy, sigmoid colectomy, descending colostomy, VAC dressing  placement. She did have a prolonged ileus postoperatively requiring  TPN for supplemental nutrition. Once bowel function returned her diet was advanced as tolerated and she was weaned off TPN. She was found to have a postop pelvic abscess and required drainage catheter placement by interventional radiology on 9/14. Culture grew enterococcus faecium only sensitive to Vancomycin, therefore she was changed to this antibiotic for a 5 day course. Repeat CT scan 9/25 showed improvement of abscess, and drain output ceased. IR performed drain injection which revealed no fistula, therefore the drain was removed on 9/26. She developed right calf pain on 9/27 and u/s revealed bilateral lower extremity DVTs. Patient was started on eliquis and will be discharged home on this with PCP follow up. On 9/28 the patient was voiding well, tolerating diet, colostomy functioning, ambulating well, pain well controlled, vital signs stable, incisions c/d/i and felt stable for discharge home.  She will go home with a wound vac for her abdominal incision. She will follow up with the ostomy clinic and general surgery - close monitoring of her stoma given that it is retracted and has mucocutaneous separation. Patient will follow up as below and knows to call with questions or concerns.    I have personally reviewed the patients medication history on the [employer] controlled substance database.     Physical Exam: Gen:  Alert, NAD, pleasant Pulm: rate and effort normal Abd: Soft, nontender, vac to midline wound with good seal. Unable to visualize ostomy well through pouch, stool in bag  Allergies as of 09/20/2022       Reactions   Benazepril Swelling   Sulfa Antibiotics Nausea Only   Elemental Sulfur Nausea Only        Medication List     STOP taking these medications    amoxicillin 500 MG capsule Commonly known as: AMOXIL   amoxicillin-clavulanate 875-125 MG tablet Commonly known as: AUGMENTIN   gabapentin 100 MG capsule Commonly known as: NEURONTIN   tizanidine 2 MG capsule Commonly  known as: ZANAFLEX   traMADol 50 MG tablet Commonly known as: ULTRAM       TAKE these medications    acetaminophen 500 MG tablet Commonly known as: TYLENOL Take 1,000 mg by mouth daily as needed for mild pain. pain   amLODipine 10 MG tablet Commonly known as: NORVASC Take 10 mg by mouth daily with supper.   apixaban 5 MG Tabs tablet Commonly known as: ELIQUIS Take 2 tablets (10 mg total) by mouth 2 (two) times daily for 6 days.   apixaban 5 MG Tabs tablet Commonly known as: ELIQUIS Take 1 tablet (5 mg total) by mouth 2 (two) times daily. Start taking on: September 26, 2022   cloNIDine 0.1 MG tablet Commonly known as: CATAPRES Take 0.1 mg by mouth daily with supper.   dicyclomine 20 MG tablet Commonly known as: BENTYL Take 20 mg by mouth every 8 (eight) hours.   fluconazole 150 MG tablet Commonly known as: Diflucan Take 1 tablet (150 mg total) by mouth daily as needed (yeast infection).   methocarbamol 500 MG tablet Commonly known as: ROBAXIN Take 2 tablets (1,000 mg total) by mouth every 8 (eight) hours as needed for muscle spasms. What changed: how much to take   multivitamin tablet Take 1 tablet by mouth daily.   ondansetron 4 MG disintegrating tablet Commonly known as: ZOFRAN-ODT Take 1 tablet (4 mg total) by mouth every 6 (six) hours as needed for nausea or vomiting.   Oxycodone HCl 10 MG Tabs  Take 0.5-1 tablets (5-10 mg total) by mouth every 6 (six) hours as needed for severe pain or moderate pain.   pantoprazole 40 MG tablet Commonly known as: PROTONIX Take 1 tablet (40 mg total) by mouth daily.   polyethylene glycol 17 g packet Commonly known as: MIRALAX / GLYCOLAX Take 17 g by mouth daily as needed (constipation).   Probiotic Caps Take 1 capsule by mouth daily.               Durable Medical Equipment  (From admission, onward)           Start     Ordered   09/19/22 1232  For home use only DME Hospital bed  Once       Question  Answer Comment  Length of Need 6 Months   Patient has (list medical condition): s/p Exploratory laparotomy, sigmoid colectomy, descending colostomy, VAC dressing placement for Diverticulitis with Colonic Obstruction 08/31/22   The above medical condition requires: Patient requires the ability to reposition frequently   Bed type Semi-electric      09/19/22 1232   09/17/22 0936  For home use only DME Vac  Once        09/17/22 0935              Follow-up Information     [facility].   Specialty: Home Health Services Why: A representative with [facility] will call to set up home health services (HHRN/PT/OT/aide) hours from discharge. Contact information: [address] [place] [address] [phone]         Darnell LevelGerkin, Todd, MD. Go on 10/02/2022.   Specialty: General Surgery Why: Your appointment is 10/02/22 @ 4:15pm, arrive @ 3:45pm to check in and fill out paperwork. Bring photo ID and insurance information. Contact information: [address] [place] [address] [phone]         Woodroe ChenJohns, Terrance, MD. Schedule an appointment as soon as possible for a visit.   Specialty: Internal Medicine Why: Call for post-hospitalization follow up appointment. You need to follow up within 1 month regarding DVT Contact information: [address] [place] [phone]         [facility] Follow up.   Contact information: Wound vac rep Tracey-tel#[phone]                 Signed: Franne FortsBrooke A Malala Trenkamp, PA-C [facility] 09/20/2022, 9:28 AM Please see Amion for pager number during day hours 7:00am-4:30pm

## 2022-09-20 NOTE — Progress Notes (Signed)
Patient discharged with granddaughter. PICC line was removed with no complications by IV team.  Wound vac connected to patient & working properly. Large supply of canisters & dressing change supplies sent with patient. Ostomy supplies sent with patient as well as the size guide piece for stoma. Patient stated to myself & WOC RN today that her daughter & [facility] RN will be assisting with dressing changes & care but that she has been educated on these skills. Patient has booklet on wound vac directions as well as ostomy care.  Patient confirmed that the new RX's were ready for pickup.

## 2022-09-24 ENCOUNTER — Other Ambulatory Visit: Payer: Self-pay

## 2022-09-24 ENCOUNTER — Encounter (HOSPITAL_COMMUNITY): Payer: Self-pay

## 2022-09-24 ENCOUNTER — Emergency Department (HOSPITAL_COMMUNITY)
Admission: EM | Admit: 2022-09-24 | Discharge: 2022-09-24 | Disposition: A | Discharge status: Home / Self Care | Payer: Medicare HMO | Attending: Emergency Medicine | Admitting: Emergency Medicine

## 2022-09-24 ENCOUNTER — Emergency Department (HOSPITAL_COMMUNITY): Payer: Medicare HMO

## 2022-09-24 DIAGNOSIS — M79604 Pain in right leg: Secondary | ICD-10-CM | POA: Diagnosis not present

## 2022-09-24 DIAGNOSIS — R600 Localized edema: Secondary | ICD-10-CM | POA: Diagnosis not present

## 2022-09-24 DIAGNOSIS — Z7901 Long term (current) use of anticoagulants: Secondary | ICD-10-CM | POA: Insufficient documentation

## 2022-09-24 DIAGNOSIS — R0789 Other chest pain: Secondary | ICD-10-CM | POA: Diagnosis not present

## 2022-09-24 DIAGNOSIS — R079 Chest pain, unspecified: Secondary | ICD-10-CM | POA: Diagnosis present

## 2022-09-24 LAB — COMPREHENSIVE METABOLIC PANEL
ALT: 14 U/L (ref 0–44)
AST: 18 U/L (ref 15–41)
Albumin: 2.8 g/dL — ABNORMAL LOW (ref 3.5–5.0)
Alkaline Phosphatase: 72 U/L (ref 38–126)
Anion gap: 8 (ref 5–15)
BUN: 9 mg/dL (ref 8–23)
CO2: 27 mmol/L (ref 22–32)
Calcium: 8.9 mg/dL (ref 8.9–10.3)
Chloride: 104 mmol/L (ref 98–111)
Creatinine, Ser: 0.71 mg/dL (ref 0.44–1.00)
GFR, Estimated: >60 mL/min (ref >60)
Glucose, Bld: 90 mg/dL (ref 70–99)
Potassium: 3.5 mmol/L (ref 3.5–5.1)
Sodium: 139 mmol/L (ref 135–145)
Total Bilirubin: 0.3 mg/dL (ref 0.3–1.2)
Total Protein: 7.2 g/dL (ref 6.5–8.1)

## 2022-09-24 LAB — TROPONIN I (HIGH SENSITIVITY)
Troponin I (High Sensitivity): 5 ng/L (ref <18)
Troponin I (High Sensitivity): 5 ng/L (ref <18)

## 2022-09-24 LAB — LACTIC ACID, PLASMA
Lactic Acid, Venous: 1 mmol/L (ref 0.5–1.9)
Lactic Acid, Venous: 0.7 mmol/L (ref 0.5–1.9)

## 2022-09-24 LAB — CBC WITH DIFFERENTIAL/PLATELET
Abs Immature Granulocytes: 0.01 10*3/uL (ref 0.00–0.07)
Basophils Absolute: 0 10*3/uL (ref 0.0–0.1)
Basophils Relative: 1 %
Eosinophils Absolute: 0.1 10*3/uL (ref 0.0–0.5)
Eosinophils Relative: 4 %
HCT: 29.8 % — ABNORMAL LOW (ref 36.0–46.0)
Hemoglobin: 9.4 g/dL — ABNORMAL LOW (ref 12.0–15.0)
Immature Granulocytes: 0 %
Lymphocytes Relative: 39 %
Lymphs Abs: 1.4 10*3/uL (ref 0.7–4.0)
MCH: 28.6 pg (ref 26.0–34.0)
MCHC: 31.5 g/dL (ref 30.0–36.0)
MCV: 90.6 fL (ref 80.0–100.0)
Monocytes Absolute: 0.4 10*3/uL (ref 0.1–1.0)
Monocytes Relative: 12 %
Neutro Abs: 1.6 10*3/uL — ABNORMAL LOW (ref 1.7–7.7)
Neutrophils Relative %: 44 %
Platelets: 282 10*3/uL (ref 150–400)
RBC: 3.29 MIL/uL — ABNORMAL LOW (ref 3.87–5.11)
RDW: 17.3 % — ABNORMAL HIGH (ref 11.5–15.5)
WBC: 3.5 10*3/uL — ABNORMAL LOW (ref 4.0–10.5)
nRBC: 0 % (ref 0.0–0.2)

## 2022-09-24 LAB — BRAIN NATRIURETIC PEPTIDE: B Natriuretic Peptide: 85.3 pg/mL (ref 0.0–100.0)

## 2022-09-24 NOTE — ED Provider Notes (Signed)
[facility]-EMERGENCY DEPT Provider Note   CSN: [id number] Arrival date & time: 09/24/22  0856     History  Chief Complaint  Patient presents with   Chest Pain   Leg Pain    Kristin Gibson is a 66 y.o. female.  HPI Patient with notable recent history of long hospitalization due to hemicolectomy, abscess drainage following an episode of diverticulitis now presents with right-sided chest pain, right leg pain.  Patient notes that her recovery from the abdominal surgery is going generally well, her wound VAC is in place, without apparent dysfunction and she has no abdominal pain.  However, over the past day she is developed increasing pain in the proximal right leg and chest.  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  She is taking her medication as directed, including Eliquis.  She is coming by granddaughter who corroborates history. EMS notes no hemodynamic instability in route.    Home Medications Prior to Admission medications   Medication Sig Start Date End Date Taking? Authorizing Provider  acetaminophen (TYLENOL) 500 MG tablet Take 1,000 mg by mouth daily as needed for mild pain. pain     [provider]  amLODipine (NORVASC) 10 MG tablet Take 10 mg by mouth daily with supper. 08/08/17   [provider]  apixaban (ELIQUIS) 5 MG TABS tablet Take 2 tablets (10 mg total) by mouth 2 (two) times daily for 6 days. 09/20/22 09/26/22  Meuth, Lina Sar, PA-C  apixaban (ELIQUIS) 5 MG TABS tablet Take 1 tablet (5 mg total) by mouth 2 (two) times daily. 09/26/22 10/26/22  Meuth, Brooke A, PA-C  cloNIDine (CATAPRES) 0.1 MG tablet Take 0.1 mg by mouth daily with supper. 10/13/18   [provider]  dicyclomine (BENTYL) 20 MG tablet Take 20 mg by mouth every 8 (eight) hours. 12/05/16   [provider]  fluconazole (DIFLUCAN) 150 MG tablet Take 1 tablet (150 mg total) by mouth daily as needed (yeast infection). 09/20/22    Meuth, Brooke A, PA-C  methocarbamol (ROBAXIN) 500 MG tablet Take 2 tablets (1,000 mg total) by mouth every 8 (eight) hours as needed for muscle spasms. 09/20/22   Meuth, Brooke A, PA-C  Multiple Vitamin (MULTIVITAMIN) tablet Take 1 tablet by mouth daily.    [provider]  nystatin (MYCOSTATIN) 100000 UNIT/ML suspension Take 5 mLs (500,000 Units total) by mouth 4 (four) times daily as needed (mouth pain). 09/20/22   Meuth, Brooke A, PA-C  ondansetron (ZOFRAN-ODT) 4 MG disintegrating tablet Take 1 tablet (4 mg total) by mouth every 6 (six) hours as needed for nausea or vomiting. 02/20/22   Berna Bue, MD  oxyCODONE 10 MG TABS Take 0.5-1 tablets (5-10 mg total) by mouth every 6 (six) hours as needed for severe pain or moderate pain. 09/20/22   Meuth, Brooke A, PA-C  pantoprazole (PROTONIX) 40 MG tablet Take 1 tablet (40 mg total) by mouth daily. 02/20/22   Berna Bue, MD  polyethylene glycol (MIRALAX / GLYCOLAX) 17 g packet Take 17 g by mouth daily as needed (constipation).    [provider]  Probiotic CAPS Take 1 capsule by mouth daily. 06/28/20   Ward, Layla Maw, DO      Allergies    Benazepril, Sulfa antibiotics, and Elemental sulfur    Review of Systems   Review of Systems  All other systems reviewed and are negative.   Physical Exam Updated Vital Signs BP 120/84 (BP Location: Left Arm)  Pulse 68   Temp 97.6 F (36.4 C) (Oral)   Resp 18   Ht 5\' 9"  (1.753 m)   Wt 110.7 kg   SpO2 99%   BMI 36.03 kg/m  Physical Exam Vitals and nursing note reviewed.  Constitutional:      General: She is not in acute distress.    Appearance: She is well-developed.  HENT:     Head: Normocephalic and atraumatic.  Eyes:     Conjunctiva/sclera: Conjunctivae normal.  Cardiovascular:     Rate and Rhythm: Normal rate and regular rhythm.  Pulmonary:     Effort: Pulmonary effort is normal. No respiratory distress.     Breath sounds: Normal breath sounds. No stridor.   Abdominal:     General: There is no distension.    Skin:    General: Skin is warm and dry.  Neurological:     Mental Status: She is alert and oriented to person, place, and time.     Cranial Nerves: No cranial nerve deficit.  Psychiatric:        Mood and Affect: Mood normal.     ED Results / Procedures / Treatments   Labs (all labs ordered are listed, but only abnormal results are displayed) Labs Reviewed  COMPREHENSIVE METABOLIC PANEL - Abnormal; Notable for the following components:      Result Value   Albumin 2.8 (*)    All other components within normal limits  CBC WITH DIFFERENTIAL/PLATELET - Abnormal; Notable for the following components:   WBC 3.5 (*)    RBC 3.29 (*)    Hemoglobin 9.4 (*)    HCT 29.8 (*)    RDW 17.3 (*)    Neutro Abs 1.6 (*)    All other components within normal limits  LACTIC ACID, PLASMA  LACTIC ACID, PLASMA  BRAIN NATRIURETIC PEPTIDE  TROPONIN I (HIGH SENSITIVITY)  TROPONIN I (HIGH SENSITIVITY)    EKG EKG Interpretation  Date/Time:  Monday September 24 2022 09:06:25 EDT Ventricular Rate:  64 PR Interval:  164 QRS Duration: 107 QT Interval:  427 QTC Calculation: 434 R Axis:   33 Text Interpretation: Sinus rhythm Low voltage, extremity and precordial leads Otherwise within normal limits Confirmed by 05-21-1969 [phone]) on 09/24/2022 10:10:59 AM  Radiology DG Chest 2 View  Result Date: 09/24/2022 CLINICAL DATA:  Chest pain, prior fusion, patient diagnosed with DVT EXAM: CHEST - 2 VIEW COMPARISON:  CXR 09/02/2022 FINDINGS: No pleural effusion. No pneumothorax. No displaced rib fractures. Unchanged cardiac and mediastinal contours compared to 09/02/2022. No focal airspace opacity. Visualized upper abdomen is unremarkable. There are mild degenerative changes of the bilateral glenohumeral and AC joints. Vertebral body heights are maintained. IMPRESSION: No focal airspace opacities. Electronically Signed   By: 11/02/2022 M.D.   On:  09/24/2022 10:53    Procedures Procedures    Medications Ordered in ED Medications - No data to display  ED Course/ Medical Decision Making/ A&P                           Medical Decision Making Adult female with known DVT, recent hospitalization for colectomy now presents with leg pain, chest pain.  Differential including inflammation secondary to thrombosis versus infection versus progression of disease including PE considered.  Initial evaluation including ECG, labs, x-ray, continuous monitoring started  Amount and/or Complexity of Data Reviewed Labs: ordered. Radiology: ordered.   On repeat exam the patient remains in no distress, hemodynamically unremarkable,  no hypertension no hypoxia, no tachypnea or tachycardia.  Second troponin normal, second lactic acid normal, though she may have micro emboli from her DVT, there is no evidence for decompensated state, with after mentioned reassuring findings as well as normal BNP, suggesting no appreciable heart strain.  No evidence for ACS, pneumonia or other acute findings.  Hospitalization a consideration given the patient's recent surgery, but patient comfortable with and appropriate for outpatient follow-up which is scheduled for later this month.   Final Clinical Impression(s) / ED Diagnoses Final diagnoses:  Atypical chest pain    Rx / DC Orders ED Discharge Orders     None         Carmin Muskrat, MD 09/24/22 1437

## 2022-09-24 NOTE — ED Triage Notes (Signed)
Pt arrived via POV, c/o right sided intermittent chest pain as well as right leg pain. States she has blood clots in her legs. Pain worsening today.

## 2022-09-24 NOTE — Discharge Instructions (Signed)
As discussed, your evaluation today has been largely reassuring.  But, it is important that you monitor your condition carefully, and do not hesitate to return to the ED if you develop new, or concerning changes in your condition. ? ?Otherwise, please follow-up with your physician for appropriate ongoing care. ? ?

## 2022-09-25 ENCOUNTER — Other Ambulatory Visit: Payer: Self-pay

## 2022-09-25 ENCOUNTER — Emergency Department (HOSPITAL_COMMUNITY)
Admission: EM | Admit: 2022-09-25 | Discharge: 2022-09-25 | Discharge status: Against Medical Advice | Payer: Medicare HMO | Attending: Student | Admitting: Student

## 2022-09-25 ENCOUNTER — Encounter (HOSPITAL_COMMUNITY): Payer: Self-pay

## 2022-09-25 DIAGNOSIS — R110 Nausea: Secondary | ICD-10-CM | POA: Diagnosis not present

## 2022-09-25 DIAGNOSIS — Z5321 Procedure and treatment not carried out due to patient leaving prior to being seen by health care provider: Secondary | ICD-10-CM | POA: Diagnosis not present

## 2022-09-25 DIAGNOSIS — R1030 Lower abdominal pain, unspecified: Secondary | ICD-10-CM | POA: Insufficient documentation

## 2022-09-25 DIAGNOSIS — Z20822 Contact with and (suspected) exposure to covid-19: Secondary | ICD-10-CM | POA: Diagnosis not present

## 2022-09-25 LAB — CBC
HCT: 34.1 % — ABNORMAL LOW (ref 36.0–46.0)
Hemoglobin: 10.6 g/dL — ABNORMAL LOW (ref 12.0–15.0)
MCH: 28.2 pg (ref 26.0–34.0)
MCHC: 31.1 g/dL (ref 30.0–36.0)
MCV: 90.7 fL (ref 80.0–100.0)
Platelets: 295 10*3/uL (ref 150–400)
RBC: 3.76 MIL/uL — ABNORMAL LOW (ref 3.87–5.11)
RDW: 17.4 % — ABNORMAL HIGH (ref 11.5–15.5)
WBC: 4 10*3/uL (ref 4.0–10.5)
nRBC: 0 % (ref 0.0–0.2)

## 2022-09-25 LAB — COMPREHENSIVE METABOLIC PANEL
ALT: 14 U/L (ref 0–44)
AST: 19 U/L (ref 15–41)
Albumin: 3.1 g/dL — ABNORMAL LOW (ref 3.5–5.0)
Alkaline Phosphatase: 74 U/L (ref 38–126)
Anion gap: 10 (ref 5–15)
BUN: 10 mg/dL (ref 8–23)
CO2: 27 mmol/L (ref 22–32)
Calcium: 9.2 mg/dL (ref 8.9–10.3)
Chloride: 102 mmol/L (ref 98–111)
Creatinine, Ser: 0.74 mg/dL (ref 0.44–1.00)
GFR, Estimated: >60 mL/min (ref >60)
Glucose, Bld: 91 mg/dL (ref 70–99)
Potassium: 3.1 mmol/L — ABNORMAL LOW (ref 3.5–5.1)
Sodium: 139 mmol/L (ref 135–145)
Total Bilirubin: 0.5 mg/dL (ref 0.3–1.2)
Total Protein: 7.8 g/dL (ref 6.5–8.1)

## 2022-09-25 LAB — URINALYSIS, ROUTINE W REFLEX MICROSCOPIC
Bacteria, UA: NONE SEEN
Bilirubin Urine: NEGATIVE
Glucose, UA: NEGATIVE mg/dL
Hgb urine dipstick: NEGATIVE
Ketones, ur: 5 mg/dL — AB
Nitrite: NEGATIVE
Protein, ur: NEGATIVE mg/dL
Specific Gravity, Urine: 1.014 (ref 1.005–1.030)
pH: 7 (ref 5.0–8.0)

## 2022-09-25 LAB — SARS CORONAVIRUS 2 BY RT PCR: SARS Coronavirus 2 by RT PCR: NEGATIVE

## 2022-09-25 LAB — LIPASE, BLOOD: Lipase: 61 U/L — ABNORMAL HIGH (ref 11–51)

## 2022-09-25 NOTE — ED Triage Notes (Signed)
Per EMS- patient is from home. Patient currently has a wound vac and an ostomy. Patient is having abdominal pain below these devices since 1000 today. BP-126/70 HR-74 O2 Sats- 98% CBG-96 T. 97.4

## 2022-09-25 NOTE — ED Notes (Signed)
Needs IV for CT 1235

## 2022-09-25 NOTE — ED Provider Triage Note (Signed)
Emergency Medicine Provider Triage Evaluation Note  Kristin Gibson , a 66 y.o. female  was evaluated in triage.  Pt complains of lower abdominal pain.  Patient was found to have a bowel obstruction a couple weeks ago and had surgery and an ostomy placed.  Says that she is now having pain.  No fevers or chills.  Nauseous but no vomiting or dark stool in the ostomy  Review of Systems  Positive: Nausea and lower abdominal pain Negative: Urinary or vaginal symptoms, vomiting  Physical Exam  BP 110/86 (BP Location: Right Arm)   Pulse 69   Temp 98.5 F (36.9 C) (Oral)   Resp 16   Ht 5\' 9"  (1.753 m)   Wt 110.7 kg   SpO2 95%   BMI 36.03 kg/m  Gen:   Awake, no distress   Resp:  Normal effort  MSK:   Moves extremities without difficulty  Other:  Mild TTP in the lower abdomen.  Well-healing ostomy site and surgical site.  No drainage.  Medical Decision Making  Medically screening exam initiated at 12:31 PM.  Appropriate orders placed.  Kristin Gibson was informed that the remainder of the evaluation will be completed by another provider, this initial triage assessment does not replace that evaluation, and the importance of remaining in the ED until their evaluation is complete.     Rhae Hammock, PA-C 09/25/22 1232

## 2022-10-01 ENCOUNTER — Telehealth: Payer: Self-pay

## 2022-10-01 NOTE — Telephone Encounter (Signed)
     Patient  visited [facility] on 09/24/2022  for Chest Pain  Telephone encounter attempt :  1st Attempt  A HIPAA compliant voice message was left requesting a return call.  Instructed patient to call back at [phone] at their earliest convenience.  [place] management  [place], [place]  Main Phone: [phone]  E-mail: [email or web address]  Website: [email or web address]

## 2022-10-02 ENCOUNTER — Telehealth: Payer: Self-pay

## 2022-10-02 NOTE — Patient Outreach (Signed)
  Care Coordination   Initial Visit Note   10/02/2022 Name: Kristin Gibson MRN: [id number] DOB: 1956/07/03  Kristin Gibson Zupko is a 66 y.o. year old female who sees Marjory Sneddon, MD for primary care. I spoke with  Kristin Gibson Persons by phone today.  What matters to the patients health and wellness today?  Patient was in the ED a few days ago. States she is feeling better. She was having some constipation and issues with colostomy. Patient voices that she has not been able to get into wound/ostomy clinic for follow up appt. Conference call made with patient and RN CM able to assist with scheduling appt on 10/05/22 at 11am. She goes to see surgeon this afternoon and PCP appt on 10/22/22. Patient confirms she uses Psychologist, clinical. Judi Cong) for PCP.    Goals Addressed             This Visit's Progress    Care Coordination-no follow up required       Care Coordination Interventions: Assessed social determinant of health barriers Assisted pt with scheduling ostomy clinic appt          SDOH assessments and interventions completed:  Yes  SDOH Interventions Today    Flowsheet Row Most Recent Value  SDOH Interventions   Food Insecurity Interventions Intervention Not Indicated  Transportation Interventions Intervention Not Indicated        Care Coordination Interventions Activated:  Yes  Care Coordination Interventions:  Yes, provided   Follow up plan: No further intervention required.   Encounter Outcome:  Pt. Visit Completed   Enzo Montgomery, RN,BSN,CCM [place] Management Telephonic Care Management Coordinator Direct Phone: [phone] Toll Free: [phone] Fax: [phone]

## 2022-10-03 ENCOUNTER — Telehealth: Payer: Self-pay

## 2022-10-03 NOTE — Telephone Encounter (Signed)
   Reason for call: ED-Follow up call   Patient  visit on 09/24/2022  at [facility] was for Chest Pain  Have you been able to follow up with your primary care physician? - Yes  The patient was or was not able to obtain any needed medicine or equipment. - N/A  Are there diet recommendations that you are having difficulty following? - No  Patient expresses understanding of discharge instructions and education provided has no other needs at this time.   [place] management  [place], [place]  Main Phone: [phone]  E-mail: [email or web address]  Website: [email or web address]

## 2022-10-03 NOTE — Telephone Encounter (Signed)
     Reason for call: ED-Follow up call  Patient visited [facility] on 09/24/2022  for Chest Pain   Telephone encounter attempt :  2nd Attempt  A HIPAA compliant voice message was left requesting a return call.  Instructed patient to call back at [phone] at their earliest convenience.  [place] management  [place], [place]  Main Phone: [phone]  E-mail: [email or web address]  Website: [email or web address]

## 2022-10-05 ENCOUNTER — Ambulatory Visit (HOSPITAL_COMMUNITY)
Admission: RE | Admit: 2022-10-05 | Discharge: 2022-10-05 | Disposition: A | Discharge status: Home / Self Care | Payer: Medicare HMO | Source: Ambulatory Visit | Attending: Nurse Practitioner | Admitting: Nurse Practitioner

## 2022-10-05 DIAGNOSIS — L24B3 Irritant contact dermatitis related to fecal or urinary stoma or fistula: Secondary | ICD-10-CM

## 2022-10-05 DIAGNOSIS — K9400 Colostomy complication, unspecified: Secondary | ICD-10-CM

## 2022-10-05 DIAGNOSIS — K9409 Other complications of colostomy: Secondary | ICD-10-CM | POA: Insufficient documentation

## 2022-10-05 NOTE — Discharge Instructions (Signed)
Continue same pouch Measure stoma (today was 1", with a little extra cut out to make oval) Cleanse skin   Add stoma powder Seal with skin prep Cover the separation with the barrier ring and around stoma Apply lubricating deodorant to pouch Roll closed WE added a belt today I will set up with [facility] for after [facility] discharge Change pouch twice weekly and if leaking.

## 2022-10-05 NOTE — Progress Notes (Signed)
[facility]   Reason for visit:  LUQ colostomy with mucocutaneous separation  NPWT in place to midline.  Not scheduled to be changed until Saturday due to malfunction earlier in week.  Seal intact at this time.  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  Nearly fully granulated.   Skin:  Positive for wound.  All other systems reviewed and are negative.  Vital signs:  BP 128/87   Pulse 60   Temp 98.3 F (36.8 C)   Resp 17   SpO2 98%  Exam:  Physical Exam Vitals and nursing note reviewed.  Constitutional:      Appearance: Normal appearance. She is obese.  Abdominal:     Palpations: Abdomen is soft.  Skin:    General: Skin is warm.  Neurological:     Mental Status: She is oriented to person, place, and time.  Psychiatric:        Mood and Affect: Mood normal.     Stoma type/location:  1" slightly oval, pink and moist  separation noted crcumferentially, extends 0.6 cm ,  Stomal assessment/size:  LUQ colostomy Peristomal assessment:  separation Treatment options for stomal/peristomal skin: stoma powder to separation and peristomal redness,  Seal with skin prep Barrier ring 1 piece convex pouch Added ostomy belt today.  Output: soft brown stool Ostomy pouching: 1pc. Convex with barrier ring  Education provided:  pouch change performed for patient and daughter.  Demonstrated powder, skin prep and ring.  Applied pouch and new ostomy belt. Added lubricating deodorant to pouch as they were not certain how to use.     Impression/dx  Stomal separation Colostomy Discussion  See back in 2 weeks Plan  Change pouch twice weekly and prn leaking VAC will likely be stopped soon.     Visit time: 60 minutes.   Maple Hudson FNP-BC

## 2022-10-08 DIAGNOSIS — K9400 Colostomy complication, unspecified: Secondary | ICD-10-CM | POA: Insufficient documentation

## 2022-10-08 DIAGNOSIS — L24B3 Irritant contact dermatitis related to fecal or urinary stoma or fistula: Secondary | ICD-10-CM | POA: Insufficient documentation

## 2022-10-09 ENCOUNTER — Other Ambulatory Visit (HOSPITAL_COMMUNITY): Payer: Self-pay | Admitting: Nurse Practitioner

## 2022-10-09 DIAGNOSIS — K9400 Colostomy complication, unspecified: Secondary | ICD-10-CM

## 2022-10-09 DIAGNOSIS — L24B3 Irritant contact dermatitis related to fecal or urinary stoma or fistula: Secondary | ICD-10-CM

## 2022-10-19 ENCOUNTER — Ambulatory Visit (HOSPITAL_COMMUNITY)
Admission: RE | Admit: 2022-10-19 | Discharge: 2022-10-19 | Disposition: A | Discharge status: Home / Self Care | Payer: Medicare HMO | Source: Ambulatory Visit | Attending: Internal Medicine | Admitting: Internal Medicine

## 2022-10-19 DIAGNOSIS — Z933 Colostomy status: Secondary | ICD-10-CM | POA: Diagnosis present

## 2022-10-19 DIAGNOSIS — T8189XD Other complications of procedures, not elsewhere classified, subsequent encounter: Secondary | ICD-10-CM

## 2022-10-19 DIAGNOSIS — K9409 Other complications of colostomy: Secondary | ICD-10-CM | POA: Insufficient documentation

## 2022-10-19 DIAGNOSIS — K9400 Colostomy complication, unspecified: Secondary | ICD-10-CM

## 2022-10-19 DIAGNOSIS — K56699 Other intestinal obstruction unspecified as to partial versus complete obstruction: Secondary | ICD-10-CM | POA: Diagnosis not present

## 2022-10-19 NOTE — Progress Notes (Signed)
[facility]   Reason for visit:  LUQ colostomy with mucocutaneous separation.  NPWT has been discontinued   HPI:  Colonic stricture and obstruction   descending colostomy ROS  Review of Systems Vital signs:  There were no vitals taken for this visit. Exam:  Physical Exam  Stoma type/location:  *** Stomal assessment/size:  *** Peristomal assessment:  *** Treatment options for stomal/peristomal skin: *** Output: *** Ostomy pouching: 1pc./2pc.  Education provided:  ***    Impression/dx  *** Discussion  *** Plan  ***    Visit time: *** minutes.   Domenic Moras FNP-BC

## 2022-10-19 NOTE — Discharge Instructions (Signed)
[facility] Phone number for supplies:  [phone]

## 2022-10-22 DIAGNOSIS — T8189XA Other complications of procedures, not elsewhere classified, initial encounter: Secondary | ICD-10-CM | POA: Insufficient documentation

## 2022-11-07 ENCOUNTER — Encounter (HOSPITAL_COMMUNITY): Payer: Self-pay | Admitting: Nurse Practitioner

## 2022-12-21 ENCOUNTER — Other Ambulatory Visit: Payer: Self-pay

## 2022-12-21 ENCOUNTER — Encounter (HOSPITAL_BASED_OUTPATIENT_CLINIC_OR_DEPARTMENT_OTHER): Payer: Self-pay | Admitting: Pediatrics

## 2022-12-21 ENCOUNTER — Emergency Department (HOSPITAL_BASED_OUTPATIENT_CLINIC_OR_DEPARTMENT_OTHER)
Admission: EM | Admit: 2022-12-21 | Discharge: 2022-12-21 | Disposition: A | Discharge status: Home / Self Care | Payer: Medicare HMO | Attending: Emergency Medicine | Admitting: Emergency Medicine

## 2022-12-21 ENCOUNTER — Emergency Department (HOSPITAL_BASED_OUTPATIENT_CLINIC_OR_DEPARTMENT_OTHER): Payer: Medicare HMO

## 2022-12-21 DIAGNOSIS — R609 Edema, unspecified: Secondary | ICD-10-CM

## 2022-12-21 DIAGNOSIS — R200 Anesthesia of skin: Secondary | ICD-10-CM | POA: Insufficient documentation

## 2022-12-21 DIAGNOSIS — R600 Localized edema: Secondary | ICD-10-CM | POA: Diagnosis not present

## 2022-12-21 DIAGNOSIS — Z7901 Long term (current) use of anticoagulants: Secondary | ICD-10-CM | POA: Insufficient documentation

## 2022-12-21 DIAGNOSIS — M7989 Other specified soft tissue disorders: Secondary | ICD-10-CM | POA: Diagnosis present

## 2022-12-21 DIAGNOSIS — Z79899 Other long term (current) drug therapy: Secondary | ICD-10-CM | POA: Diagnosis not present

## 2022-12-21 LAB — CBC WITH DIFFERENTIAL/PLATELET
Abs Immature Granulocytes: 0 10*3/uL (ref 0.00–0.07)
Basophils Absolute: 0 10*3/uL (ref 0.0–0.1)
Basophils Relative: 1 %
Eosinophils Absolute: 0.1 10*3/uL (ref 0.0–0.5)
Eosinophils Relative: 3 %
HCT: 38.3 % (ref 36.0–46.0)
Hemoglobin: 12.4 g/dL (ref 12.0–15.0)
Immature Granulocytes: 0 %
Lymphocytes Relative: 54 %
Lymphs Abs: 2.2 10*3/uL (ref 0.7–4.0)
MCH: 28.6 pg (ref 26.0–34.0)
MCHC: 32.4 g/dL (ref 30.0–36.0)
MCV: 88.5 fL (ref 80.0–100.0)
Monocytes Absolute: 0.4 10*3/uL (ref 0.1–1.0)
Monocytes Relative: 11 %
Neutro Abs: 1.3 10*3/uL — ABNORMAL LOW (ref 1.7–7.7)
Neutrophils Relative %: 31 %
Platelets: 190 10*3/uL (ref 150–400)
RBC: 4.33 MIL/uL (ref 3.87–5.11)
RDW: 15.7 % — ABNORMAL HIGH (ref 11.5–15.5)
WBC: 4 10*3/uL (ref 4.0–10.5)
nRBC: 0 % (ref 0.0–0.2)

## 2022-12-21 LAB — COMPREHENSIVE METABOLIC PANEL
ALT: 11 U/L (ref 0–44)
AST: 16 U/L (ref 15–41)
Albumin: 3.5 g/dL (ref 3.5–5.0)
Alkaline Phosphatase: 84 U/L (ref 38–126)
Anion gap: 7 (ref 5–15)
BUN: 11 mg/dL (ref 8–23)
CO2: 27 mmol/L (ref 22–32)
Calcium: 9 mg/dL (ref 8.9–10.3)
Chloride: 105 mmol/L (ref 98–111)
Creatinine, Ser: 0.77 mg/dL (ref 0.44–1.00)
GFR, Estimated: >60 mL/min (ref >60)
Glucose, Bld: 91 mg/dL (ref 70–99)
Potassium: 3.5 mmol/L (ref 3.5–5.1)
Sodium: 139 mmol/L (ref 135–145)
Total Bilirubin: 0.2 mg/dL — ABNORMAL LOW (ref 0.3–1.2)
Total Protein: 7.4 g/dL (ref 6.5–8.1)

## 2022-12-21 NOTE — ED Triage Notes (Signed)
Reported hx of blood clots on both legs and been on Eliquis since September; c/o tingling tingling sensation on right lower leg; c/o bilateral swelling on feet and ankle, worst on the left side x 1 week. Reports was sent by PCP for further eval.

## 2022-12-21 NOTE — ED Provider Notes (Signed)
[facility] EMERGENCY DEPARTMENT Provider Note   CSN: [id number] Arrival date & time: 12/21/22  1651     History  Chief Complaint  Patient presents with   Leg Swelling   Numbness    Kristin Gibson is a 66 y.o. female.  Patient here with leg swelling.  History of DVT has been on Eliquis last couple months.  Sent by primary care doctor for reevaluation for DVT.  She is got some swelling that she says comes and goes.  Denies any chest pain or shortness of breath or abdominal pain.  Denies any history of heart failure or kidney disease.  Feels like she is got some tingling in her feet at times.  Denies any weakness or numbness currently.  The history is provided by the patient.       Home Medications Prior to Admission medications   Medication Sig Start Date End Date Taking? Authorizing Provider  acetaminophen (TYLENOL) 500 MG tablet Take 1,000 mg by mouth daily as needed for mild pain. pain     [provider]  amLODipine (NORVASC) 10 MG tablet Take 10 mg by mouth daily with supper. 08/08/17   [provider]  apixaban (ELIQUIS) 5 MG TABS tablet Take 2 tablets (10 mg total) by mouth 2 (two) times daily for 6 days. 09/20/22 09/26/22  Meuth, Lina Sar, PA-C  apixaban (ELIQUIS) 5 MG TABS tablet Take 1 tablet (5 mg total) by mouth 2 (two) times daily. 09/26/22 10/26/22  Meuth, Brooke A, PA-C  cloNIDine (CATAPRES) 0.1 MG tablet Take 0.1 mg by mouth daily with supper. 10/13/18   [provider]  dicyclomine (BENTYL) 20 MG tablet Take 20 mg by mouth every 8 (eight) hours. 12/05/16   [provider]  fluconazole (DIFLUCAN) 150 MG tablet Take 1 tablet (150 mg total) by mouth daily as needed (yeast infection). 09/20/22   Meuth, Brooke A, PA-C  methocarbamol (ROBAXIN) 500 MG tablet Take 2 tablets (1,000 mg total) by mouth every 8 (eight) hours as needed for muscle spasms. 09/20/22   Meuth, Brooke A, PA-C  Multiple Vitamin (MULTIVITAMIN) tablet Take 1  tablet by mouth daily.    [provider]  nystatin (MYCOSTATIN) 100000 UNIT/ML suspension Take 5 mLs (500,000 Units total) by mouth 4 (four) times daily as needed (mouth pain). 09/20/22   Meuth, Brooke A, PA-C  ondansetron (ZOFRAN-ODT) 4 MG disintegrating tablet Take 1 tablet (4 mg total) by mouth every 6 (six) hours as needed for nausea or vomiting. 02/20/22   Berna Bue, MD  oxyCODONE 10 MG TABS Take 0.5-1 tablets (5-10 mg total) by mouth every 6 (six) hours as needed for severe pain or moderate pain. 09/20/22   Meuth, Brooke A, PA-C  pantoprazole (PROTONIX) 40 MG tablet Take 1 tablet (40 mg total) by mouth daily. 02/20/22   Berna Bue, MD  polyethylene glycol (MIRALAX / GLYCOLAX) 17 g packet Take 17 g by mouth daily as needed (constipation).    [provider]  Probiotic CAPS Take 1 capsule by mouth daily. 06/28/20   Ward, Layla Maw, DO      Allergies    Benazepril, Sulfa antibiotics, and Elemental sulfur    Review of Systems   Review of Systems  Physical Exam Updated Vital Signs BP (!) 134/93   Pulse (!) 48   Temp 98.4 F (36.9 C)   Resp 18   Ht 5\' 9"  (1.753 m)   Wt 111.6 kg   SpO2 98%   BMI  36.33 kg/m  Physical Exam Vitals and nursing note reviewed.  Constitutional:      General: She is not in acute distress.    Appearance: She is well-developed.  HENT:     Head: Normocephalic and atraumatic.     Nose: Nose normal.     Mouth/Throat:     Mouth: Mucous membranes are moist.  Eyes:     Extraocular Movements: Extraocular movements intact.     Conjunctiva/sclera: Conjunctivae normal.     Pupils: Pupils are equal, round, and reactive to light.  Cardiovascular:     Rate and Rhythm: Normal rate and regular rhythm.     Pulses: Normal pulses.     Heart sounds: Normal heart sounds. No murmur heard. Pulmonary:     Effort: Pulmonary effort is normal. No respiratory distress.     Breath sounds: Normal breath sounds.  Abdominal:     Palpations: Abdomen  is soft.     Tenderness: There is no abdominal tenderness.  Musculoskeletal:        General: No swelling.     Cervical back: Neck supple.  Skin:    General: Skin is warm and dry.     Capillary Refill: Capillary refill takes less than 2 seconds.     Comments: Trace edema in both legs bilaterally  Neurological:     General: No focal deficit present.     Mental Status: She is alert.  Psychiatric:        Mood and Affect: Mood normal.     ED Results / Procedures / Treatments   Labs (all labs ordered are listed, but only abnormal results are displayed) Labs Reviewed  CBC WITH DIFFERENTIAL/PLATELET - Abnormal; Notable for the following components:      Result Value   RDW 15.7 (*)    Neutro Abs 1.3 (*)    All other components within normal limits  COMPREHENSIVE METABOLIC PANEL - Abnormal; Notable for the following components:   Total Bilirubin 0.2 (*)    All other components within normal limits    EKG None  Radiology US Venous Img Lower Bilateral (DVT)  Result Date: 12/21/2022 CLINICAL DATA:  Bilateral lower extremity swelling, initial encounter EXAM: BILATERAL LOWER EXTREMITY VENOUS DOPPLER ULTRASOUND TECHNIQUE: Gray-scale sonography with graded compression, as well as color Doppler and duplex ultrasound were performed to evaluate the lower extremity deep venous systems from the level of the common femoral vein and including the common femoral, femoral, profunda femoral, popliteal and calf veins including the posterior tibial, peroneal and gastrocnemius veins when visible. The superficial great saphenous vein was also interrogated. Spectral Doppler was utilized to evaluate flow at rest and with distal augmentation maneuvers in the common femoral, femoral and popliteal veins. COMPARISON:  By report from 09/19/2022 FINDINGS: RIGHT LOWER EXTREMITY Common Femoral Vein: No evidence of thrombus. Normal compressibility, respiratory phasicity and response to augmentation. Saphenofemoral  Junction: No evidence of thrombus. Normal compressibility and flow on color Doppler imaging. Profunda Femoral Vein: No evidence of thrombus. Normal compressibility and flow on color Doppler imaging. Femoral Vein: No evidence of thrombus. Normal compressibility, respiratory phasicity and response to augmentation. Popliteal Vein: No evidence of thrombus. Normal compressibility, respiratory phasicity and response to augmentation. Calf Veins: Posterior tibial vein is within normal limits. Chronic wall adherent thrombus is noted in the peroneal vein residual from prior ultrasound. Superficial Great Saphenous Vein: No evidence of thrombus. Normal compressibility. Venous Reflux:  None. Other Findings:  None. LEFT LOWER EXTREMITY Common Femoral Vein: No evidence of thrombus.  Normal compressibility, respiratory phasicity and response to augmentation. Saphenofemoral Junction: No evidence of thrombus. Normal compressibility and flow on color Doppler imaging. Profunda Femoral Vein: No evidence of thrombus. Normal compressibility and flow on color Doppler imaging. Femoral Vein: No evidence of thrombus. Normal compressibility, respiratory phasicity and response to augmentation. Popliteal Vein: No evidence of thrombus. Normal compressibility, respiratory phasicity and response to augmentation. Calf Veins: No evidence of thrombus. Normal compressibility and flow on color Doppler imaging. Superficial Great Saphenous Vein: No evidence of thrombus. Normal compressibility. Venous Reflux:  None. Other Findings:  None. IMPRESSION: No evidence of acute deep venous thrombosis in either lower extremity. 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 Electronically Signed   By: Alcide Clever M.D.   On: 12/21/2022 19:03    Procedures Procedures    Medications Ordered in ED Medications - No data to display  ED Course/ Medical Decision Making/ A&P                           Medical Decision Making Amount and/or Complexity  of Data Reviewed Labs: ordered.   Lennan Malone Gonnella is here with concern for swelling in her legs.  Was sent by primary care doctor for ultrasounds of her legs as she was recently diagnosed with DVT a few months ago and is on Eliquis.  She has been compliant with her medications.  She has noticed intermittent swelling back-and-forth on both legs.  She had trace edema.  She not have any shortness of breath or chest pain.  She is well-appearing.  No history of heart failure.  Will check basic labs including DVT studies of her legs.  This overall appears to be venous stasis process of dependent edema.  Will rule out DVT, renal failure.  Patient had already great improvement with compression socks the last few days.  Per my review interpretation labs no significant anemia or electrolyte abnormality or kidney injury.  Awaiting DVT study.  DVT study per radiology report shows no acute DVT.  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  Overall given reassurance.  Discharged in good condition.  This chart was dictated using voice recognition software.  Despite best efforts to proofread,  errors can occur which can change the documentation meaning.         Final Clinical Impression(s) / ED Diagnoses Final diagnoses:  Peripheral edema    Rx / DC Orders ED Discharge Orders     None         Virgina Norfolk, DO 12/21/22 1918

## 2022-12-21 NOTE — ED Notes (Signed)
Written and verbal inst to pt  Verbalized an understanding  To home with family  

## 2023-02-07 ENCOUNTER — Telehealth: Payer: Medicare HMO | Admitting: Skilled Nursing Facility1

## 2023-02-07 ENCOUNTER — Other Ambulatory Visit: Payer: Self-pay | Admitting: Surgery

## 2023-02-07 DIAGNOSIS — K5790 Diverticulosis of intestine, part unspecified, without perforation or abscess without bleeding: Secondary | ICD-10-CM

## 2023-02-07 DIAGNOSIS — Z9049 Acquired absence of other specified parts of digestive tract: Secondary | ICD-10-CM

## 2023-02-13 ENCOUNTER — Encounter: Payer: Medicare HMO | Attending: Surgery | Admitting: Skilled Nursing Facility1

## 2023-02-13 ENCOUNTER — Encounter: Payer: Self-pay | Admitting: Skilled Nursing Facility1

## 2023-02-13 DIAGNOSIS — Z713 Dietary counseling and surveillance: Secondary | ICD-10-CM | POA: Insufficient documentation

## 2023-02-13 DIAGNOSIS — E669 Obesity, unspecified: Secondary | ICD-10-CM | POA: Diagnosis present

## 2023-02-13 NOTE — Progress Notes (Signed)
Bariatric Nutrition Follow-Up Visit Medical Nutrition Therapy   NUTRITION ASSESSMENT  Anthropometrics  Surgery date: 02/19/2022 Surgery type: sleeve Start weight at [facility]: 294.8 Height: 69 in Weight today: virtual appt: pt identified by name and DOB; pt agreeable to this visit type     Body Composition Scale 03/06/2022 04/17/2022 07/28/2022  Current Body Weight 281.7 270.5 251.6  Total Body Fat % 47.4 46.5 44.8  Visceral Fat 19  16  Fat-Free Mass % 52.5  55.1   Total Body Water % 40.7 41.2 42.0  Muscle-Mass lbs 32.8  32.5  BMI 41.4 39.7 36.9  Body Fat Displacement            Torso  lbs 83.0  70.0         Left Leg  lbs 16.6  14.0         Right Leg  lbs 16.6  14.0         Left Arm  lbs 8.3  7.0         Right Arm   lbs 8.3  7.0   Clinical  Medical hx: diverticulitis, HTN Medications: see list Labs: A1C 6.4 Notable signs/symptoms: diverticulitis flare: pain in abdomen, knee pain, back pain  Any previous deficiencies? Potassium    Lifestyle & Dietary Hx  Recent placement of coelosomy so has had to change her diet and it will be about 3 moths until the reversal. Pt states raw vegetables do not digest well so anything with fiber constipates her and busts out of the bag.  Pt states she does not measure her foods.  Pt sates plant based proteins go well with no issues with the colostomy.  Pt states green beans goes really well but anything with a peel does not digest well but yellow squash goes well. Pt sates pasta goes really well. Pt states ice cream made her throw up.  Pt states she does not smoke.   Estimated daily fluid intake:  oz Estimated daily protein intake: 60+ g Supplements: multi vitamin and not taking calcium Current average weekly physical activity: ADLs  24-Hr Dietary Recall First Meal: plant based sausage patty + 40 calorie toast Snack: hummus + ritz crackers Second Meal: beefaroni  Snack:  peanut butter + bagel Third Meal: pork chop + baked potato + green  beans Snack: popcorn Beverages: Gatorade zero, water, un-sweet tea, coffee, orange juice, sugar free orange twist, alcohol occasionally    Post-Op Goals/ Signs/ Symptoms Using straws: no Drinking while eating: no Chewing/swallowing difficulties: no Changes in vision: no Changes to mood/headaches: no Hair loss/changes to skin/nails: no Difficulty focusing/concentrating: no Sweating: no Limb weakness: no Dizziness/lightheadedness: no Palpitations: no Carbonated/caffeinated beverages: no N/V/D/C/Gas: taking MiraLax 2 times a day Abdominal pain: no Dumping syndrome: no    NUTRITION DIAGNOSIS  Overweight/obesity (Posey-3.3) related to past poor dietary habits and physical inactivity as evidenced by completed bariatric surgery and following dietary guidelines for continued weight loss and healthy nutrition status.     NUTRITION INTERVENTION Nutrition counseling (C-1) and education (E-2) to facilitate bariatric surgery goals, including: The importance of consuming adequate calories as well as certain nutrients daily due to the body's need for essential vitamins, minerals, and fats The importance of daily physical activity and to reach a goal of at least 150 minutes of moderate to vigorous physical activity weekly (or as directed by their physician) due to benefits such as increased musculature and improved lab values The importance of intuitive eating specifically learning hunger-satiety cues and understanding the importance  of learning a new body: The importance of mindful eating to avoid grazing behaviors   Appropriate Foods: -limit vegetables to 1/4-1/2 cup per meal -limit carbs to 1/4-1/2 cup -do not eat raw vegetables or fruit -snack options: 1/4 banana,  no sugar added applesauce, 1-2T peanut butter, 5 crackers, 1 hard boiled egg, 1 cheese stick, 1 no sugar low fat greek yogurt, 1 ounce chicken  -avoid corn and skins even blueberries -continue to aim for a minimum 64 fluid ounces  per day -get back into walking each week  -limit alcohol to 2-4 ounces wine  Handouts Provided Include    Learning Style & Readiness for Change Teaching method utilized: Visual & Auditory  Demonstrated degree of understanding via: Teach Back  Readiness Level: Ready Barriers to learning/adherence to lifestyle change: non identified  RD's Notes for Next Visit Assess adherence to pt chosen goals   MONITORING & EVALUATION Dietary intake, weekly physical activity, body weight  Next Steps Patient is to follow-up 3 months

## 2023-02-19 ENCOUNTER — Other Ambulatory Visit: Payer: Medicare HMO

## 2023-02-20 ENCOUNTER — Telehealth: Payer: Self-pay | Admitting: Gastroenterology

## 2023-02-20 NOTE — Telephone Encounter (Signed)
Good Morning Dr. Bryan Lemma,  Supervising MD for today 2/28 AM    Patient called stating that Dr. Dema Severin had sent a referral to be seen for partial colectomy and diverticular disease. We have also received records from patients previous gastro provider [facility] in [place]. The records include office visit notes and patients last colonoscopy from March of 2018. I will be sending records for you to review, will you please review and advise on scheduling patient?  Thank you.

## 2023-02-25 ENCOUNTER — Other Ambulatory Visit: Payer: Self-pay | Admitting: Surgery

## 2023-02-25 ENCOUNTER — Other Ambulatory Visit: Payer: Medicare HMO

## 2023-02-25 ENCOUNTER — Ambulatory Visit
Admission: RE | Admit: 2023-02-25 | Discharge: 2023-02-25 | Disposition: A | Discharge status: Home / Self Care | Payer: Medicare HMO | Source: Ambulatory Visit | Attending: Surgery | Admitting: Surgery

## 2023-02-25 DIAGNOSIS — Z9049 Acquired absence of other specified parts of digestive tract: Secondary | ICD-10-CM

## 2023-02-25 DIAGNOSIS — K5790 Diverticulosis of intestine, part unspecified, without perforation or abscess without bleeding: Secondary | ICD-10-CM

## 2023-02-25 NOTE — Telephone Encounter (Signed)
Records received and reviewed and notable for the following:  - Colonoscopy in [place] approximately 2011 and no polyps per patient.  Otherwise adopted and no known family history. - 11/25/2016: ER evaluation for abdominal pain.  WBC 7.6.  CT with diverticulosis without diverticulitis.  Was treated with antibiotics and discharged home - 12/05/2016: Evaluated at [facility] for evaluation of abdominal pain with recurrent diverticulitis and constipation.  Improvement in pain with antibiotics from the ER.  Treats constipation with stool softener. - 03/07/2017: Colonoscopy: Sigmoid diverticulosis, otherwise normal including normal TI.  Repeat in 10 years - 02/19/2022: Sleeve gastrectomy, HH repair by Dr. Kae Heller - 08/15/2022: Follow-up appointment at [facility].  Per notes, history of recurrent diverticulitis and had apparently recommended sigmoid colectomy in 07/2020, but never completed.  Constipation managed with stool softeners.  Planning for gastric sleeve for weight loss.  Apparently had CT done prior to that appointment and diagnosed with recurrent diverticulitis again and treated with Cipro/Flagyl, but ongoing symptoms at that follow-up appointment prompting repeat CT. - 08/16/2022: CT A/P with IV contrast: Wall thickening of the sigmoid colon which may relate to residual or recurrent diverticulitis.  Recommend follow-up colonoscopy to exclude any underlying neoplastic process.  Postsurgical changes in the stomach.  Nonspecific distal esophageal thickening.  Treated with Augmentin. - 08/31/2022: Ex lap, sigmoid colectomy, descending colostomy, VAC dressing placement.  Path showing diverticulosis, diverticulitis, and micro abscesses with viable margins. - 02/05/2023: Follow-up appointment with Dr. Dema Severin in Colorectal Surgery.  Doing well with her colostomy with occasional constipation treated with MiraLAX.  No output from rectum.  Referred to [facility] for repeat colonoscopy prior to undergoing  colostomy reversal surgery.  Also planning Gastrografin enema via anus  Ok to transfer care per patient and her surgeon request.  Needs OV first.

## 2023-02-26 ENCOUNTER — Other Ambulatory Visit: Payer: Medicare HMO

## 2023-02-26 ENCOUNTER — Encounter: Payer: Self-pay | Admitting: Gastroenterology

## 2023-02-26 NOTE — Telephone Encounter (Signed)
Patient has been scheduled for OV for 5/8 at 1:20

## 2023-03-05 ENCOUNTER — Ambulatory Visit (HOSPITAL_COMMUNITY)
Admission: RE | Admit: 2023-03-05 | Discharge: 2023-03-05 | Disposition: A | Discharge status: Home / Self Care | Payer: Medicare HMO | Source: Ambulatory Visit | Attending: Nurse Practitioner | Admitting: Nurse Practitioner

## 2023-03-05 DIAGNOSIS — I10 Essential (primary) hypertension: Secondary | ICD-10-CM | POA: Diagnosis not present

## 2023-03-05 DIAGNOSIS — R7303 Prediabetes: Secondary | ICD-10-CM | POA: Diagnosis not present

## 2023-03-05 DIAGNOSIS — Z7901 Long term (current) use of anticoagulants: Secondary | ICD-10-CM | POA: Diagnosis not present

## 2023-03-05 DIAGNOSIS — K219 Gastro-esophageal reflux disease without esophagitis: Secondary | ICD-10-CM | POA: Insufficient documentation

## 2023-03-05 DIAGNOSIS — Z79899 Other long term (current) drug therapy: Secondary | ICD-10-CM | POA: Diagnosis not present

## 2023-03-05 DIAGNOSIS — K9400 Colostomy complication, unspecified: Secondary | ICD-10-CM | POA: Diagnosis not present

## 2023-03-05 DIAGNOSIS — K5792 Diverticulitis of intestine, part unspecified, without perforation or abscess without bleeding: Secondary | ICD-10-CM | POA: Diagnosis present

## 2023-03-05 DIAGNOSIS — Z933 Colostomy status: Secondary | ICD-10-CM | POA: Diagnosis not present

## 2023-03-05 NOTE — Progress Notes (Signed)
[facility]   Reason for visit:  LUQ colostomy, increase in leaks, pursuing reversal.  HPI:  Diverticulitis with colostomy   Past Medical History:  Diagnosis Date   Arthritis    Back pain    Diverticulitis    GERD (gastroesophageal reflux disease)    Hypertension    Knee pain, chronic    PONV (postoperative nausea and vomiting)    Pre-diabetes    Sleep apnea    cpap   Family History  Problem Relation Age of Onset   Hypertension Mother    Cancer Sister    Allergies  Allergen Reactions   Benazepril Swelling   Sulfa Antibiotics Nausea Only   Elemental Sulfur Nausea Only   Current Outpatient Medications  Medication Sig Dispense Refill Last Dose   acetaminophen (TYLENOL) 500 MG tablet Take 1,000 mg by mouth daily as needed for mild pain. pain       amLODipine (NORVASC) 10 MG tablet Take 10 mg by mouth daily with supper.      apixaban (ELIQUIS) 5 MG TABS tablet Take 2 tablets (10 mg total) by mouth 2 (two) times daily for 6 days. 24 tablet 0    apixaban (ELIQUIS) 5 MG TABS tablet Take 1 tablet (5 mg total) by mouth 2 (two) times daily. 60 tablet 0    cloNIDine (CATAPRES) 0.1 MG tablet Take 0.1 mg by mouth daily with supper.      dicyclomine (BENTYL) 20 MG tablet Take 20 mg by mouth every 8 (eight) hours.      fluconazole (DIFLUCAN) 150 MG tablet Take 1 tablet (150 mg total) by mouth daily as needed (yeast infection). 3 tablet 0    methocarbamol (ROBAXIN) 500 MG tablet Take 2 tablets (1,000 mg total) by mouth every 8 (eight) hours as needed for muscle spasms. 60 tablet 1    Multiple Vitamin (MULTIVITAMIN) tablet Take 1 tablet by mouth daily.      nystatin (MYCOSTATIN) 100000 UNIT/ML suspension Take 5 mLs (500,000 Units total) by mouth 4 (four) times daily as needed (mouth pain). 60 mL 0    ondansetron (ZOFRAN-ODT) 4 MG disintegrating tablet Take 1 tablet (4 mg total) by mouth every 6 (six) hours as needed for nausea or vomiting. 20 tablet 0    oxyCODONE 10 MG TABS  Take 0.5-1 tablets (5-10 mg total) by mouth every 6 (six) hours as needed for severe pain or moderate pain. 25 tablet 0    pantoprazole (PROTONIX) 40 MG tablet Take 1 tablet (40 mg total) by mouth daily. 90 tablet 0    polyethylene glycol (MIRALAX / GLYCOLAX) 17 g packet Take 17 g by mouth daily as needed (constipation).      Probiotic CAPS Take 1 capsule by mouth daily. 30 capsule 0    No current facility-administered medications for this encounter.   ROS  Review of Systems Vital signs:  BP (!) 157/94 (BP Location: Right Arm)   Pulse (!) 50   Temp 97.8 F (36.6 C) (Oral)   Resp 18   SpO2 97%  Exam:  Physical Exam  Stoma type/location:  LLQ  Stomal assessment/size:  1" flush Peristomal assessment:  erythema, partial thickness breakdown Treatment options for stomal/peristomal skin: stoma powder and skin prep  1 piece convex pouch and barrier ring Output: soft brown stool Ostomy pouching: 1pc.convex with barrier ring Education provided:  Be sure to empty when 1/3 full to avoid excess weight pulling pouch down    Impression/dx  Colostomy  Discussion  See  back as needed  [facility] for supplies Plan  FOllow up with GI and surgery for reversal.     Visit time: 45 minutes.   Domenic Moras FNP-BC

## 2023-03-05 NOTE — Discharge Instructions (Signed)
[facility] for supplies

## 2023-03-19 ENCOUNTER — Ambulatory Visit (HOSPITAL_COMMUNITY)
Admission: RE | Admit: 2023-03-19 | Discharge: 2023-03-19 | Disposition: A | Discharge status: Home / Self Care | Payer: Medicare HMO | Source: Ambulatory Visit | Attending: Internal Medicine | Admitting: Internal Medicine

## 2023-03-19 DIAGNOSIS — Z433 Encounter for attention to colostomy: Secondary | ICD-10-CM | POA: Insufficient documentation

## 2023-03-19 DIAGNOSIS — K9400 Colostomy complication, unspecified: Secondary | ICD-10-CM

## 2023-03-21 NOTE — Progress Notes (Signed)
[facility]   Reason for visit:  LLq colostomy HPI:   Past Medical History:  Diagnosis Date   Arthritis    Back pain    Diverticulitis    GERD (gastroesophageal reflux disease)    Hypertension    Knee pain, chronic    PONV (postoperative nausea and vomiting)    Pre-diabetes    Sleep apnea    cpap   Family History  Problem Relation Age of Onset   Hypertension Mother    Cancer Sister    Allergies  Allergen Reactions   Benazepril Swelling   Sulfa Antibiotics Nausea Only   Elemental Sulfur Nausea Only   Current Outpatient Medications  Medication Sig Dispense Refill Last Dose   acetaminophen (TYLENOL) 500 MG tablet Take 1,000 mg by mouth daily as needed for mild pain. pain       amLODipine (NORVASC) 10 MG tablet Take 10 mg by mouth daily with supper.      apixaban (ELIQUIS) 5 MG TABS tablet Take 2 tablets (10 mg total) by mouth 2 (two) times daily for 6 days. 24 tablet 0    apixaban (ELIQUIS) 5 MG TABS tablet Take 1 tablet (5 mg total) by mouth 2 (two) times daily. 60 tablet 0    cloNIDine (CATAPRES) 0.1 MG tablet Take 0.1 mg by mouth daily with supper.      dicyclomine (BENTYL) 20 MG tablet Take 20 mg by mouth every 8 (eight) hours.      fluconazole (DIFLUCAN) 150 MG tablet Take 1 tablet (150 mg total) by mouth daily as needed (yeast infection). 3 tablet 0    methocarbamol (ROBAXIN) 500 MG tablet Take 2 tablets (1,000 mg total) by mouth every 8 (eight) hours as needed for muscle spasms. 60 tablet 1    Multiple Vitamin (MULTIVITAMIN) tablet Take 1 tablet by mouth daily.      nystatin (MYCOSTATIN) 100000 UNIT/ML suspension Take 5 mLs (500,000 Units total) by mouth 4 (four) times daily as needed (mouth pain). 60 mL 0    ondansetron (ZOFRAN-ODT) 4 MG disintegrating tablet Take 1 tablet (4 mg total) by mouth every 6 (six) hours as needed for nausea or vomiting. 20 tablet 0    oxyCODONE 10 MG TABS Take 0.5-1 tablets (5-10 mg total) by mouth every 6 (six) hours as needed  for severe pain or moderate pain. 25 tablet 0    pantoprazole (PROTONIX) 40 MG tablet Take 1 tablet (40 mg total) by mouth daily. 90 tablet 0    polyethylene glycol (MIRALAX / GLYCOLAX) 17 g packet Take 17 g by mouth daily as needed (constipation).      Probiotic CAPS Take 1 capsule by mouth daily. 30 capsule 0    No current facility-administered medications for this encounter.   ROS  Review of Systems  Gastrointestinal:        LLq colostomy  Skin: Negative.   All other systems reviewed and are negative.  Vital signs:  There were no vitals taken for this visit. Exam:  Physical Exam Constitutional:      Appearance: Normal appearance.  Abdominal:     Palpations: Abdomen is soft.  Skin:    General: Skin is warm and dry.  Neurological:     Mental Status: She is alert and oriented to person, place, and time.  Psychiatric:        Mood and Affect: Mood normal.        Behavior: Behavior normal.     Stoma type/location:  LLq  colostomy Stomal assessment/size:  1 1/4" pink andmoist Peristomal assessment:  intact Treatment options for stomal/peristomal skin: 1 piece convex Output: soft brown stool Ostomy pouching: 1pc. Convex with barrier ring  Education provided:  awaiting reversal    Impression/dx  colostomy Discussion  See back as needed Plan  Call clinic as needed.     Visit time: 35 minutes.   Domenic Moras FNP-BC

## 2023-03-25 DIAGNOSIS — K469 Unspecified abdominal hernia without obstruction or gangrene: Secondary | ICD-10-CM

## 2023-03-25 HISTORY — DX: Unspecified abdominal hernia without obstruction or gangrene: K46.9

## 2023-04-10 ENCOUNTER — Other Ambulatory Visit: Payer: Self-pay

## 2023-04-10 ENCOUNTER — Encounter (HOSPITAL_BASED_OUTPATIENT_CLINIC_OR_DEPARTMENT_OTHER): Payer: Self-pay | Admitting: Pediatrics

## 2023-04-10 ENCOUNTER — Emergency Department (HOSPITAL_BASED_OUTPATIENT_CLINIC_OR_DEPARTMENT_OTHER): Payer: Medicare HMO

## 2023-04-10 ENCOUNTER — Emergency Department (HOSPITAL_BASED_OUTPATIENT_CLINIC_OR_DEPARTMENT_OTHER)
Admission: EM | Admit: 2023-04-10 | Discharge: 2023-04-10 | Disposition: A | Discharge status: Home / Self Care | Payer: Medicare HMO | Attending: Emergency Medicine | Admitting: Emergency Medicine

## 2023-04-10 DIAGNOSIS — E669 Obesity, unspecified: Secondary | ICD-10-CM | POA: Insufficient documentation

## 2023-04-10 DIAGNOSIS — Z6835 Body mass index (BMI) 35.0-35.9, adult: Secondary | ICD-10-CM | POA: Diagnosis not present

## 2023-04-10 DIAGNOSIS — Z7901 Long term (current) use of anticoagulants: Secondary | ICD-10-CM | POA: Insufficient documentation

## 2023-04-10 DIAGNOSIS — R001 Bradycardia, unspecified: Secondary | ICD-10-CM

## 2023-04-10 DIAGNOSIS — R079 Chest pain, unspecified: Secondary | ICD-10-CM

## 2023-04-10 DIAGNOSIS — R1032 Left lower quadrant pain: Secondary | ICD-10-CM | POA: Diagnosis present

## 2023-04-10 DIAGNOSIS — N3000 Acute cystitis without hematuria: Secondary | ICD-10-CM

## 2023-04-10 DIAGNOSIS — I10 Essential (primary) hypertension: Secondary | ICD-10-CM | POA: Diagnosis not present

## 2023-04-10 LAB — URINALYSIS, ROUTINE W REFLEX MICROSCOPIC
Bilirubin Urine: NEGATIVE
Glucose, UA: NEGATIVE mg/dL
Hgb urine dipstick: NEGATIVE
Ketones, ur: NEGATIVE mg/dL
Nitrite: NEGATIVE
Protein, ur: NEGATIVE mg/dL
Specific Gravity, Urine: 1.01 (ref 1.005–1.030)
pH: 6.5 (ref 5.0–8.0)

## 2023-04-10 LAB — COMPREHENSIVE METABOLIC PANEL
ALT: 12 U/L (ref 0–44)
AST: 16 U/L (ref 15–41)
Albumin: 3.7 g/dL (ref 3.5–5.0)
Alkaline Phosphatase: 78 U/L (ref 38–126)
Anion gap: 9 (ref 5–15)
BUN: 16 mg/dL (ref 8–23)
CO2: 24 mmol/L (ref 22–32)
Calcium: 8.8 mg/dL — ABNORMAL LOW (ref 8.9–10.3)
Chloride: 105 mmol/L (ref 98–111)
Creatinine, Ser: 0.88 mg/dL (ref 0.44–1.00)
GFR, Estimated: >60 mL/min (ref >60)
Glucose, Bld: 85 mg/dL (ref 70–99)
Potassium: 3.7 mmol/L (ref 3.5–5.1)
Sodium: 138 mmol/L (ref 135–145)
Total Bilirubin: 0.7 mg/dL (ref 0.3–1.2)
Total Protein: 7.2 g/dL (ref 6.5–8.1)

## 2023-04-10 LAB — URINALYSIS, MICROSCOPIC (REFLEX): RBC / HPF: NONE SEEN RBC/hpf (ref 0–5)

## 2023-04-10 LAB — CBC
HCT: 40.2 % (ref 36.0–46.0)
Hemoglobin: 12.9 g/dL (ref 12.0–15.0)
MCH: 29.1 pg (ref 26.0–34.0)
MCHC: 32.1 g/dL (ref 30.0–36.0)
MCV: 90.5 fL (ref 80.0–100.0)
Platelets: 172 10*3/uL (ref 150–400)
RBC: 4.44 MIL/uL (ref 3.87–5.11)
RDW: 14.7 % (ref 11.5–15.5)
WBC: 4.2 10*3/uL (ref 4.0–10.5)
nRBC: 0 % (ref 0.0–0.2)

## 2023-04-10 LAB — TROPONIN I (HIGH SENSITIVITY): Troponin I (High Sensitivity): 5 ng/L (ref <18)

## 2023-04-10 LAB — LIPASE, BLOOD: Lipase: 29 U/L (ref 11–51)

## 2023-04-10 MED ORDER — OXYCODONE HCL 5 MG PO TABS: 2.5000 mg | ORAL_TABLET | Freq: Four times a day (QID) | ORAL | 0 refills | Status: DC | PRN

## 2023-04-10 MED ORDER — IOHEXOL 300 MG/ML  SOLN
100.0000 mL | Freq: Once | INTRAMUSCULAR | Status: AC | PRN
  Administered 2023-04-10: 100 mL via INTRAVENOUS

## 2023-04-10 MED ORDER — FENTANYL CITRATE PF 50 MCG/ML IJ SOSY
12.5000 ug | PREFILLED_SYRINGE | Freq: Once | INTRAMUSCULAR | Status: AC
  Administered 2023-04-10: 12.5 ug via INTRAVENOUS
  Filled 2023-04-10: qty 1

## 2023-04-10 MED ORDER — OXYCODONE-ACETAMINOPHEN 5-325 MG PO TABS
1.0000 | ORAL_TABLET | Freq: Once | ORAL | Status: AC
  Administered 2023-04-10: 1 via ORAL
  Filled 2023-04-10: qty 1

## 2023-04-10 MED ORDER — ONDANSETRON HCL 4 MG PO TABS: 4.0000 mg | ORAL_TABLET | Freq: Four times a day (QID) | ORAL | 0 refills | Status: DC

## 2023-04-10 MED ORDER — CEPHALEXIN 500 MG PO CAPS: 500.0000 mg | ORAL_CAPSULE | Freq: Three times a day (TID) | ORAL | 0 refills | Status: AC

## 2023-04-10 NOTE — ED Triage Notes (Signed)
C/O abdominal pain on LLQ since Monday. Reports increased urination prior to that, stated her BP was high this morning as well. Reports had a colostomy placed last September.

## 2023-04-10 NOTE — ED Provider Notes (Signed)
[facility] Provider Note   CSN: [id number] Arrival date & time: 04/10/23  1139     History  Chief Complaint  Patient presents with   Abdominal Pain    Kristin Gibson is a 67 y.o. female.  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  States the pain began on Monday.  Has been constant is nature.  It is worse with standing and walking.  She is not having nausea, vomiting, UTI symptoms, or change in colostomy output.  She does not make any stool.  She did have some chest discomfort on Monday that lasted about 2 hours.  She has had no return of this.  She denies fever, chills, nausea, vomiting, diarrhea, pain with urination, frequency of urination, blood in urine, change in colostomy output.     Home Medications Prior to Admission medications   Medication Sig Start Date End Date Taking? Authorizing Provider  acetaminophen (TYLENOL) 500 MG tablet Take 1,000 mg by mouth daily as needed for mild pain. pain    Yes [provider]  apixaban (ELIQUIS) 5 MG TABS tablet Take 1 tablet (5 mg total) by mouth 2 (two) times daily. 09/26/22 04/10/23 Yes Meuth, Brooke A, PA-C  cloNIDine (CATAPRES) 0.1 MG tablet Take 0.1 mg by mouth daily. 10/13/18  Yes [provider]  dicyclomine (BENTYL) 20 MG tablet Take 20 mg by mouth as needed for spasms. 12/05/16  Yes [provider]  methocarbamol (ROBAXIN) 500 MG tablet Take 2 tablets (1,000 mg total) by mouth every 8 (eight) hours as needed for muscle spasms. 09/20/22  Yes Meuth, Brooke A, PA-C  Multiple Vitamin (MULTIVITAMIN) tablet Take 1 tablet by mouth daily.   Yes [provider]  Multiple Vitamins-Minerals (BARIATRIC FUSION) CHEW Chew 1 tablet by mouth daily.   Yes [provider]  ondansetron (ZOFRAN-ODT) 4 MG disintegrating tablet  Take 1 tablet (4 mg total) by mouth every 6 (six) hours as needed for nausea or vomiting. Patient taking differently: Take 4 mg by mouth as needed for nausea or vomiting. 02/20/22  Yes Berna Bue, MD  pantoprazole (PROTONIX) 40 MG tablet Take 1 tablet (40 mg total) by mouth daily. 02/20/22  Yes Berna Bue, MD  polyethylene glycol (MIRALAX / GLYCOLAX) 17 g packet Take 17 g by mouth daily.   Yes [provider]  Probiotic CAPS Take 1 capsule by mouth daily. 06/28/20  Yes Ward, Layla Maw, DO  spironolactone (ALDACTONE) 50 MG tablet Take 50 mg by mouth daily. 04/20/21  Yes [provider]  fluconazole (DIFLUCAN) 150 MG tablet Take 1 tablet (150 mg total) by mouth daily as needed (yeast infection). Patient not taking: Reported on 04/10/2023 09/20/22   Carlena Bjornstad A, PA-C  nystatin (MYCOSTATIN) 100000 UNIT/ML suspension Take 5 mLs (500,000 Units total) by mouth 4 (four) times daily as needed (mouth pain). Patient not taking: Reported on 04/10/2023 09/20/22   Carlena Bjornstad A, PA-C  oxyCODONE 10 MG TABS Take 0.5-1 tablets (5-10 mg total) by mouth every 6 (six) hours as needed for severe pain or moderate pain. Patient not taking: Reported on 04/10/2023 09/20/22   Carlena Bjornstad A, PA-C      Allergies    Benazepril, Sulfa antibiotics, and Elemental sulfur    Review of Systems   Review of Systems  Physical Exam Updated Vital Signs BP (!) 149/78   Pulse Marland Kitchen)  41   Temp 98.2 F (36.8 C) (Oral)   Resp 13   Ht 1.753 m ( )   Wt 108.9 kg   SpO2 100%   BMI 35.44 kg/m  Physical Exam Vitals reviewed.  Constitutional:      Appearance: She is well-developed. She is obese.  HENT:     Head: Normocephalic.     Mouth/Throat:     Mouth: Mucous membranes are moist.  Eyes:     Extraocular Movements: Extraocular movements intact.  Cardiovascular:     Rate and Rhythm: Normal rate and regular rhythm.     Heart sounds: Normal heart sounds.  Pulmonary:     Effort: Pulmonary effort  is normal.     Breath sounds: Normal breath sounds.  Abdominal:     General: Bowel sounds are normal.     Palpations: Abdomen is soft.     Tenderness: There is abdominal tenderness in the left lower quadrant. There is no right CVA tenderness, left CVA tenderness, guarding or rebound.     Comments: Colostomy bag in place, side of abdomen  Skin:    General: Skin is warm and dry.     Capillary Refill: Capillary refill takes less than 2 seconds.  Neurological:     General: No focal deficit present.     Mental Status: She is alert.  Psychiatric:        Mood and Affect: Mood normal.     ED Results / Procedures / Treatments   Labs (all labs ordered are listed, but only abnormal results are displayed) Labs Reviewed  COMPREHENSIVE METABOLIC PANEL - Abnormal; Notable for the following components:      Result Value   Calcium 8.8 (*)    All other components within normal limits  LIPASE, BLOOD  CBC  URINALYSIS, ROUTINE W REFLEX MICROSCOPIC  TROPONIN I (HIGH SENSITIVITY)    EKG None  Radiology CT ABDOMEN PELVIS W CONTRAST  Result Date: 04/10/2023 CLINICAL DATA:  Diverticulitis, complication suspected EXAM: CT ABDOMEN AND PELVIS WITH CONTRAST TECHNIQUE: Multidetector CT imaging of the abdomen and pelvis was performed using the standard protocol following bolus administration of intravenous contrast. RADIATION DOSE REDUCTION: This exam was performed according to the departmental dose-optimization program which includes automated exposure control, adjustment of the mA and/or kV according to patient size and/or use of iterative reconstruction technique. CONTRAST:  OMNIPAQUE IOHEXOL 300 MG/ML  SOLN COMPARISON:  CT 09/17/2022 FINDINGS: Lower chest: Trace residual pericardial effusion, similar to prior exam in September 2023. Lung bases are clear. Hepatobiliary: No focal liver abnormality is seen. The gallbladder is unremarkable. Pancreas: Unremarkable. No pancreatic ductal dilatation or  surrounding inflammatory changes. Spleen: Normal in size without focal abnormality. Adrenals/Urinary Tract: Adrenal glands are unremarkable. No hydronephrosis or nephroureterolithiasis. There are multiple bilateral renal cysts which are stable from prior exam, appear simple, and require no dedicated follow-up imaging. Moderate distension of the urinary bladder. Stomach/Bowel: Postsurgical changes of sleeve gastrectomy. Small hiatal hernia. The appendix is not definitively identified and is either surgically absent or decompressed. Postsurgical changes of sigmoid colectomy with left hemiabdomen end colostomy. There is a new small bowel containing parastomal hernia with multiple loops of small bowel which appear nonobstructed. Vascular/Lymphatic: Scattered atherosclerosis. No AAA. No lymphadenopathy. Reproductive: Prior hysterectomy. Other: Left hemiabdomen parastomal hernia described above. Prior anterior midline incision. No ascites. No free air. Musculoskeletal: No acute osseous abnormality. No suspicious osseous lesion. Multilevel degenerative changes of the spine. IMPRESSION: Postsurgical changes of sigmoid colectomy and left-sided end colostomy.  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 No evidence of bowel obstruction or other acute findings. Trace residual pericardial effusion, similar prior exam in September 2023. Electronically Signed   By: Caprice Renshaw M.D.   On: 04/10/2023 13:53    Procedures Procedures    Medications Ordered in ED Medications  fentaNYL (SUBLIMAZE) injection 12.5 mcg (12.5 mcg Intravenous Given 04/10/23 1248)  iohexol (OMNIPAQUE) 300 MG/ML solution 100 mL (100 mLs Intravenous Contrast Given 04/10/23 1308)  oxyCODONE-acetaminophen (PERCOCET/ROXICET) 5-325 MG per tablet 1 tablet (1 tablet Oral Given 04/10/23 1545)    ED Course/ Medical Decision Making/ A&P Clinical Course as of 04/10/23 1548  Wed Apr 10, 2023  1543 EKG was sinus bradycardia rate in the 40s and has  had previous EKGs with similar rates otherwise is hemodynamically stable with normal blood pressure [DR]    Clinical Course User Index [DR] Margarita Grizzle, MD             HEART Score: 3                Medical Decision Making Amount and/or Complexity of Data Reviewed Labs: ordered. Radiology: ordered.  Risk Prescription drug management.   67 year old female history of diverticulitis presents today with left lower quadrant pain Differential diagnosis of her left lower quadrant pain includes but is not limited to diverticulitis, small bowel obstruction, urinary tract infection, renal colic, other causes of colitis, ovarian masses and other diseases of the female and will track. Abdominal pain patient evaluated here with labs and CT scan.  CT scan shows some New parastomal hernia with loops of small bowel but no evidence of obstruction.  Discussed care with general surgery, Dr. Carolynne Edouard.  Discussed CT findings.  They will follow patient up in clinic.  Agrees that with no obstruction, although this may be contributing to her pain or discomfort, it is not in need of acute intervention. Urine is pending at this time and may be source of her pain. 2 patient had an episode of chest pain on Monday that lasted hours.  She is not having any pain since that time.  She has had some bradycardia here on the monitor.  Bradycardia appears stable from prior episodes.  Blood pressure is normal.  Troponin is normal.  Pain was 2 days ago and a single troponin will be sufficient Heart score is low and patient appears stable for discharge from a cardiac perspective Care discussed with Dr. Doran Durand pending urinalysis and probable discharge       Final Clinical Impression(s) / ED Diagnoses Final diagnoses:  Left lower quadrant abdominal pain  Bradycardia  Chest pain, unspecified type    Rx / DC Orders ED Discharge Orders     None         Margarita Grizzle, MD 04/10/23 1548

## 2023-04-10 NOTE — ED Notes (Signed)
Pt reports pain in the lower L abd.  Just below the ostomy.  Pt. Has had nausea but no vomiting.  This all started on Monday.  Pt. Reports rectal exam on Monday with [facility] and pain started after that.  The exam was done to see about her ostomy reversal.  Pt. Reports she had pockets of pus in her rectum she was told by Dr. Cliffton Asters she was told to get an enema and she has not done that because of hurting.  Pt. Has stool coming out in her ostomy as normal per pt.

## 2023-04-10 NOTE — ED Notes (Signed)
Pt provided with juice and graham crackers per request

## 2023-04-10 NOTE — ED Provider Notes (Signed)
Care of patient received from prior provider at 7:31 PM, please see their note for complete H/P and care plan.  Received handoff per ED course.  Clinical Course as of 04/10/23 1934  Wed Apr 10, 2023  1543 EKG was sinus bradycardia rate in the 40s and has had previous EKGs with similar rates otherwise is hemodynamically stable with normal blood pressure [DR]  1931 Reassessed at bedside.  Patient does have bacteriuria and leukocyturia.  She is endorsing dysuria every time she uses the bathroom.  Hemodynamically stable no acute distress.  Recommend close follow-up with primary care provider, surgical provider in outpatient setting.  She is requesting discharge as she is symptomatically improved.  Will send short course of pain control, nausea medicine, antibiotic for suspected UTI and recommend close follow-up with PCP, strict return precautions reinforced. [CC]    Clinical Course User Index [CC] Glyn Ade, MD [DR] Margarita Grizzle, MD         Glyn Ade, MD 04/10/23 [phone]

## 2023-04-10 NOTE — ED Notes (Signed)
Up to  to Br

## 2023-04-10 NOTE — ED Notes (Signed)
Called lab to inquire on urine that was collected at 1300 and has not resulted, states working on it now

## 2023-04-10 NOTE — ED Notes (Signed)
Dr. Rosalia Hammers states patient can eat.

## 2023-04-10 NOTE — ED Notes (Signed)
Pt reports she knows her heart rate is low(40) and she is to see a cards dr before revision of her colostomy but she hasn't gotten into see one yet, pt states she had cest pain and has been extremely tired  for a week or 2

## 2023-04-24 HISTORY — PX: TRIGGER FINGER RELEASE: SHX641

## 2023-05-01 ENCOUNTER — Telehealth: Payer: Self-pay

## 2023-05-01 ENCOUNTER — Ambulatory Visit (INDEPENDENT_AMBULATORY_CARE_PROVIDER_SITE_OTHER): Payer: Medicare HMO | Admitting: Gastroenterology

## 2023-05-01 ENCOUNTER — Encounter: Payer: Self-pay | Admitting: Gastroenterology

## 2023-05-01 VITALS — BP 130/88 | HR 45 | Ht 69.0 in | Wt 246.0 lb

## 2023-05-01 DIAGNOSIS — Z7901 Long term (current) use of anticoagulants: Secondary | ICD-10-CM | POA: Diagnosis not present

## 2023-05-01 DIAGNOSIS — Z86718 Personal history of other venous thrombosis and embolism: Secondary | ICD-10-CM | POA: Diagnosis not present

## 2023-05-01 DIAGNOSIS — I4891 Unspecified atrial fibrillation: Secondary | ICD-10-CM

## 2023-05-01 DIAGNOSIS — K5790 Diverticulosis of intestine, part unspecified, without perforation or abscess without bleeding: Secondary | ICD-10-CM

## 2023-05-01 DIAGNOSIS — Z1211 Encounter for screening for malignant neoplasm of colon: Secondary | ICD-10-CM

## 2023-05-01 MED ORDER — NA SULFATE-K SULFATE-MG SULF 17.5-3.13-1.6 GM/177ML PO SOLN: 1.0000 | ORAL | 0 refills | Status: DC

## 2023-05-01 NOTE — Telephone Encounter (Signed)
Medical Clearance for holding Eliquis 2 days prior faxed to Dr. Melvyn Novas at [facility]

## 2023-05-01 NOTE — Patient Instructions (Addendum)
You have been scheduled for a colonoscopy. Please follow written instructions given to you at your visit today.  Please pick up your prep supplies at the pharmacy within the next 1-3 days. If you use inhalers (even only as needed), please bring them with you on the day of your procedure.   You will be contaced by our office prior to your procedure for directions on holding your Eliquis.  If you do not hear from our office 1 week prior to your scheduled procedure, please call [phone] to discuss.   _______________________________________________________  If your blood pressure at your visit was 140/90 or greater, please contact your primary care physician to follow up on this.  _______________________________________________________  If you are age 60 or older, your body mass index should be between 23-30. Your Body mass index is 36.33 kg/m. If this is out of the aforementioned range listed, please consider follow up with your Primary Care Provider.  If you are age 46 or younger, your body mass index should be between 19-25. Your Body mass index is 36.33 kg/m. If this is out of the aformentioned range listed, please consider follow up with your Primary Care Provider.   __________________________________________________________  The  GI providers would like to encourage you to use [facility] to communicate with providers for non-urgent requests or questions.  Due to long hold times on the telephone, sending your provider a message by [facility] may be a faster and more efficient way to get a response.  Please allow 48 business hours for a response.  Please remember that this is for non-urgent requests.

## 2023-05-01 NOTE — Progress Notes (Signed)
Chief Complaint: Discuss colonoscopy, perioperative medication management   Referring Provider:     Woodroe Chen, MD   HPI:     Kristin Gibson is a 67 y.o. female referred to the [facility]ology [facility] for evaluation of colonoscopy prior to planned surgery (colostomy reversal).  Reviewed previous records which are notable for the following:   - Colonoscopy in [place] approximately 2011 and no polyps per patient.  Otherwise adopted and no known family history. - 11/25/2016: ER evaluation for abdominal pain.  WBC 7.6.  CT with diverticulosis without diverticulitis.  Was treated with antibiotics and discharged home - 12/05/2016: Evaluated at [facility] for evaluation of abdominal pain with recurrent diverticulitis and constipation.  Improvement in pain with antibiotics from the ER.  Treats constipation with stool softener. - 03/07/2017: Colonoscopy: Sigmoid diverticulosis, otherwise normal including normal TI.  Repeat in 10 years - 02/19/2022: Sleeve gastrectomy, HH repair by Dr. Fredricka Bonine - 08/15/2022: Follow-up appointment at [facility].  Per notes, history of recurrent diverticulitis and had apparently recommended sigmoid colectomy in 07/2020, but never completed.  Constipation managed with stool softeners.  Planning for gastric sleeve for weight loss.  Apparently had CT done prior to that appointment and diagnosed with recurrent diverticulitis again and treated with Cipro/Flagyl, but ongoing symptoms at that follow-up appointment prompting repeat CT. - 08/16/2022: CT A/P with IV contrast: Wall thickening of the sigmoid colon which may relate to residual or recurrent diverticulitis.  Recommend follow-up colonoscopy to exclude any underlying neoplastic process.  Postsurgical changes in the stomach.  Nonspecific distal esophageal thickening.  Treated with Augmentin. - 08/31/2022: Ex lap, sigmoid colectomy, descending colostomy, VAC dressing placement.  Path  showing diverticulosis, diverticulitis, and micro abscesses with viable margins. - 02/05/2023: Follow-up appointment with Dr. Cliffton Asters in Colorectal Surgery.  Doing well with her colostomy with occasional constipation treated with MiraLAX.  No output from rectum.  Referred to [facility] for repeat colonoscopy prior to undergoing colostomy reversal surgery.  Also planning Gastrografin enema via anus - 02/25/2023: Unsuccessful attempted barium enema due to inability to advance either a soft Foley catheter or regular rectal tube into rectum. - 04/08/2023: Follow-up with Dr. Cliffton Asters.  DRE with no palpable mass.  Referred for colonoscopy preoperatively. - 04/10/2023: CT A/P: Postsurgical changes of sigmoid colectomy and left-sided end colostomy.  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  No evidence of bowel obstruction, diverticulitis.   She otherwise feels well.  No blood in ostomy. No abdominal pain, nausea, vomiting.  Very minimal to no output from the Hartman's pouch.  Has appt with [facility] on 8/1 for pre-operative evaluation prior to colostomy reversal.  She did have a DVT in January.  She was seen by her PCM, Dr. Melvyn Novas, on 03/26/2023 who provided preoperative clearance to hold her Eliquis for procedures starting after May/2024.  Separately, history of GERD which is well-controlled with pantoprazole 40 mg daily.    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   Hypertension    Knee pain, chronic    PONV (postoperative nausea and vomiting)    Pre-diabetes    Sleep apnea    cpap     Past Surgical History:  Procedure Laterality Date   ABDOMINAL HYSTERECTOMY     BREAST REDUCTION SURGERY  IR CATHETER TUBE CHANGE  09/12/2022   IR SINUS/FIST TUBE CHK-NON GI  09/18/2022   KNEE SURGERY     LAPAROSCOPIC GASTRIC SLEEVE RESECTION N/A 02/19/2022   Procedure: LAPAROSCOPIC  GASTRIC SLEEVE RESECTION, HIATAL HERNIA REPAIR;  Surgeon: Berna Bue, MD;  Location: [facility];  Service: General;  Laterality: N/A;   PARTIAL COLECTOMY N/A 08/31/2022   Procedure: OPEN PARTIAL COLECTOMY WITH COLOSTOMY;  Surgeon: Darnell Level, MD;  Location: [facility];  Service: General;  Laterality: N/A;   right knee replacement      TUBAL LIGATION     UPPER GI ENDOSCOPY N/A 02/19/2022   Procedure: UPPER GI ENDOSCOPY;  Surgeon: Berna Bue, MD;  Location: [facility];  Service: General;  Laterality: N/A;   Family History  Problem Relation Age of Onset   Hypertension Mother    Cancer Sister    Diabetes Sister    Liver disease Neg Hx    Esophageal cancer Neg Hx    Colon cancer Neg Hx    Social History   Tobacco Use   Smoking status: Former   Smokeless tobacco: Never  Vaping Use   Vaping Use: Never used  Substance Use Topics   Alcohol use: Yes    Comment: occasionally   Drug use: No   Current Outpatient Medications  Medication Sig Dispense Refill   acetaminophen (TYLENOL) 500 MG tablet Take 1,000 mg by mouth daily as needed for mild pain. pain      cloNIDine (CATAPRES) 0.1 MG tablet Take 0.1 mg by mouth daily.     dicyclomine (BENTYL) 20 MG tablet Take 20 mg by mouth as needed for spasms.     fluconazole (DIFLUCAN) 150 MG tablet Take 1 tablet (150 mg total) by mouth daily as needed (yeast infection). 3 tablet 0   methocarbamol (ROBAXIN) 500 MG tablet Take 2 tablets (1,000 mg total) by mouth every 8 (eight) hours as needed for muscle spasms. 60 tablet 1   Multiple Vitamin (MULTIVITAMIN) tablet Take 1 tablet by mouth daily.     Multiple Vitamins-Minerals (BARIATRIC FUSION) CHEW Chew 1 tablet by mouth daily.     nystatin (MYCOSTATIN) 100000 UNIT/ML suspension Take 5 mLs (500,000 Units total) by mouth 4 (four) times daily as needed (mouth pain). 60 mL 0   ondansetron (ZOFRAN) 4 MG tablet Take 1 tablet (4 mg total) by mouth every 6 (six) hours. 12 tablet 0   ondansetron (ZOFRAN-ODT) 4  MG disintegrating tablet Take 1 tablet (4 mg total) by mouth every 6 (six) hours as needed for nausea or vomiting. (Patient taking differently: Take 4 mg by mouth as needed for nausea or vomiting.) 20 tablet 0   oxyCODONE (ROXICODONE) 5 MG immediate release tablet Take 0.5-1 tablets (2.5-5 mg total) by mouth every 6 (six) hours as needed for severe pain. 12 tablet 0   oxyCODONE 10 MG TABS Take 0.5-1 tablets (5-10 mg total) by mouth every 6 (six) hours as needed for severe pain or moderate pain. 25 tablet 0   pantoprazole (PROTONIX) 40 MG tablet Take 1 tablet (40 mg total) by mouth daily. 90 tablet 0   polyethylene glycol (MIRALAX / GLYCOLAX) 17 g packet Take 17 g by mouth daily.     Probiotic CAPS Take 1 capsule by mouth daily. 30 capsule 0   spironolactone (ALDACTONE) 50 MG tablet Take 50 mg by mouth daily.     apixaban (ELIQUIS) 5 MG TABS tablet Take 1 tablet (5 mg total) by mouth 2 (two) times daily. 60  tablet 0   No current facility-administered medications for this visit.   Allergies  Allergen Reactions   Benazepril Swelling   Sulfa Antibiotics Itching and Nausea Only   Elemental Sulfur Itching and Nausea Only     Review of Systems: All systems reviewed and negative except where noted in HPI.     Physical Exam:    Wt Readings from Last 3 Encounters:  05/01/23 246 lb (111.6 kg)  04/10/23 240 lb (108.9 kg)  12/21/22 246 lb (111.6 kg)    BP 130/88   Pulse (!) 45   Ht 5\' 9"  (1.753 m)   Wt 246 lb (111.6 kg)   BMI 36.33 kg/m  Constitutional:  Pleasant, in no acute distress. Psychiatric: Normal mood and affect. Behavior is normal. Neurological: Alert and oriented to person place and time. Skin: Skin is warm and dry. No rashes noted.   ASSESSMENT AND PLAN;   1) Diverticulosis with history of diverticulitis 2) Colon cancer screening 67 year old female with diverticulosis and history of diverticulitis, eventually requiring ex lap with sigmoid colectomy and descending  colostomy in 08/2022.  Last colonoscopy was 2018.  She needs repeat colonoscopy prior to anticipated colostomy reversal later this year. - Plan for colonoscopy via ostomy and rectal Hartmann pouch endoscopic evaluation  3) Atrial fibrillation 4) History of DVT 5) Chronic anticoagulation - Per her PCM, okay to hold Eliquis x 2 days after completion of 3 months of anticoagulation - Will schedule for procedure to be done in June - Follow-up with [facility] and Cardiology  The indications, risks, and benefits of colonoscopy were explained to the patient in detail. Risks include but are not limited to bleeding, perforation, adverse reaction to medications, and cardiopulmonary compromise. Sequelae include but are not limited to the possibility of surgery, hospitalization, and mortality. The patient verbalized understanding and wished to proceed. All questions answered, referred to the scheduler and bowel prep ordered. Further recommendations pending results of the exam.      Shellia Cleverly, DO, FACG  05/01/2023, 1:49 PM   Woodroe Chen, MD

## 2023-05-06 NOTE — Telephone Encounter (Signed)
Received clearance from Dr. Melvyn Novas office to hold Eliquis 2 days prior to procedure. Patient made aware.

## 2023-06-03 ENCOUNTER — Ambulatory Visit: Payer: Medicare HMO | Admitting: Skilled Nursing Facility1

## 2023-06-03 ENCOUNTER — Encounter: Payer: Medicare HMO | Attending: Surgery | Admitting: Skilled Nursing Facility1

## 2023-06-03 ENCOUNTER — Encounter: Payer: Self-pay | Admitting: Skilled Nursing Facility1

## 2023-06-03 VITALS — Wt 244.5 lb

## 2023-06-03 DIAGNOSIS — Z6835 Body mass index (BMI) 35.0-35.9, adult: Secondary | ICD-10-CM | POA: Diagnosis not present

## 2023-06-03 DIAGNOSIS — E669 Obesity, unspecified: Secondary | ICD-10-CM | POA: Diagnosis present

## 2023-06-03 DIAGNOSIS — K5732 Diverticulitis of large intestine without perforation or abscess without bleeding: Secondary | ICD-10-CM | POA: Diagnosis not present

## 2023-06-03 DIAGNOSIS — Z713 Dietary counseling and surveillance: Secondary | ICD-10-CM | POA: Diagnosis not present

## 2023-06-03 NOTE — Progress Notes (Signed)
Bariatric Nutrition Follow-Up Visit Medical Nutrition Therapy   NUTRITION ASSESSMENT  Anthropometrics  Surgery date: 02/19/2022 Surgery type: sleeve Start weight at [facility]: 294.8 Height: 69 in Weight today: 244.5 pounds    Body Composition Scale 03/06/2022 04/17/2022 07/28/2022 06/03/2023  Current Body Weight 281.7 270.5 251.6 244.5  Total Body Fat % 47.4 46.5 44.8 44.3  Visceral Fat 19  16 15   Fat-Free Mass % 52.5  55.1 55.6   Total Body Water % 40.7 41.2 42.0 42.3  Muscle-Mass lbs 32.8  32.5 32.3  BMI 41.4 39.7 36.9 35.9  Body Fat Displacement             Torso  lbs 83.0  70.0 67.1         Left Leg  lbs 16.6  14.0 13.4         Right Leg  lbs 16.6  14.0 13.4         Left Arm  lbs 8.3  7.0 6.7         Right Arm   lbs 8.3  7.0 6.7   Clinical  Medical hx: diverticulitis, HTN Medications: see list Labs: A1C 6.4, calcium 8.8 Notable signs/symptoms: diverticulitis flare: pain in abdomen, knee pain, back pain  Any previous deficiencies? Potassium    Lifestyle & Dietary Hx  placement of colostomy (states she has a hernia at ostomy) so has had to change her diet until the reversal which has not been scheduled yet.   Pt states every day is a challenge and has to keep MiraLax in her system to keep everything flowing as needed.  Pt states the only thing she can eat that is raw is coleslaw stating she also avoids peels and skins and seeds.  Pt states high fiber cruciferous vegetables do not go well.  Pt states grapes, strawberries, cantaloupe, and blueberries are not an issue.   Pt states she needs a nap daily at 3pm.   Pt states she can tolerate black beans and romaine lettuce.   Pt states dairy lately has made her want to throw up.  Pt states she can tolerate almonds.    Estimated daily fluid intake: consisently less than 64 oz Estimated daily protein intake: 60+ g Supplements: not taking Current average weekly physical activity: some walking since a shot in her hip; 2 days  a week about 20 minutes   24-Hr Dietary Recall First Meal: scrambled egg + toast + liver pudding Snack: peanut butter crackers or just peanut butter Second Meal: baked chicken and coleslaw and peas Snack:   Third Meal: steak + green pepper + onion + rice Snack:  Beverages: Gatorade zero, water, un-sweet tea, coffee, orange juice, sugar free orange twist, alcohol occasionally    Post-Op Goals/ Signs/ Symptoms Using straws: no Drinking while eating: no Chewing/swallowing difficulties: no Changes in vision: no Changes to mood/headaches: no Hair loss/changes to skin/nails: no Difficulty focusing/concentrating: no Sweating: no Limb weakness: no Dizziness/lightheadedness: no Palpitations: no Carbonated/caffeinated beverages: no N/V/D/C/Gas: taking MiraLax 2 times a day Abdominal pain: no Dumping syndrome: no    NUTRITION DIAGNOSIS  Overweight/obesity ([place]-3.3) related to past poor dietary habits and physical inactivity as evidenced by completed bariatric surgery and following dietary guidelines for continued weight loss and healthy nutrition status.     NUTRITION INTERVENTION Nutrition counseling (C-1) and education (E-2) to facilitate bariatric surgery goals, including: The importance of consuming adequate calories as well as certain nutrients daily due to the body's need for essential vitamins, minerals, and fats The  importance of daily physical activity and to reach a goal of at least 150 minutes of moderate to vigorous physical activity weekly (or as directed by their physician) due to benefits such as increased musculature and improved lab values The importance of intuitive eating specifically learning hunger-satiety cues and understanding the importance of learning a new body: The importance of mindful eating to avoid grazing behaviors  Creation of balanced and diverse meals to increase the intake of nutrient-rich foods that provide essential vitamins, minerals, fiber, and  phytonutrients  Variety of Fruits and Vegetables:  Aim for a colorful array of fruits and vegetables to ensure a wide range of nutrients. Include a mix of leafy greens, berries, citrus fruits, cruciferous vegetables, and more. Whole Grains: Choose whole grains over refined grains. Examples include brown rice, quinoa, oats, whole wheat, and barley. Lean Proteins: Include lean sources of protein, such as poultry, fish, tofu, legumes, beans, lentils, and low-fat dairy products. Limit red and processed meats. Healthy Fats: Incorporate sources of healthy fats, including avocados, nuts, seeds, and olive oil. Limit saturated and trans fats found in fried and processed foods. Dairy or Dairy Alternatives: Choose low-fat or fat-free dairy products, or plant-based alternatives like almond or soy milk. Portion Control: Be mindful of portion sizes to avoid overeating. Pay attention to hunger and satisfaction cues. Limit Added Sugars: Minimize the consumption of sugary beverages, snacks, and desserts. Check food labels for added sugars and opt for natural sources of sweetness such as whole fruits. Hydration: Drink plenty of water throughout the day. Limit sugary drinks and excessive caffeine intake. Moderate Sodium Intake: Reduce the consumption of high-sodium foods. Use herbs and spices for flavor instead of excessive salt. Meal Planning and Preparation: Plan and prepare meals ahead of time to make healthier choices more convenient. Include a mix of food groups in each meal. Limit Processed Foods: Minimize the intake of highly processed and packaged foods that are often high in added sugars, salt, and unhealthy fats. Regular Physical Activity: Combine a healthy diet with regular physical activity for overall well-being. Aim for at least 150 minutes of moderate-intensity aerobic exercise per week, along with strength training. Moderation and Balance: Enjoy treats and indulgent foods in  moderation, emphasizing balance rather than strict restriction.  Goals: -instead of skipping a meal have [place] yogurt and 1 serving of fruit -avoid broccoli, cauliflower, brussels  -avoid fried foods completely  -cook all vegetables soft -try apples cooked slightly without the skin added with cinnamon for a dessert  -call canned fruit without skin should be fine -all white starches are well tolerated (bread, rice, pasta) -white or sweet potato without the skin is fine  Learning Style & Readiness for Change Teaching method utilized: Visual & Auditory  Demonstrated degree of understanding via: Teach Back  Readiness Level: Ready Barriers to learning/adherence to lifestyle change: non identified  RD's Notes for Next Visit Assess adherence to pt chosen goals   MONITORING & EVALUATION Dietary intake, weekly physical activity, body weight  Next Steps Patient is to follow-up after surgery

## 2023-06-04 ENCOUNTER — Ambulatory Visit (HOSPITAL_COMMUNITY): Payer: Medicare HMO | Admitting: Nurse Practitioner

## 2023-06-04 ENCOUNTER — Ambulatory Visit (HOSPITAL_COMMUNITY)
Admission: RE | Admit: 2023-06-04 | Discharge: 2023-06-04 | Disposition: A | Discharge status: Home / Self Care | Payer: Medicare HMO | Source: Ambulatory Visit | Attending: Nurse Practitioner | Admitting: Nurse Practitioner

## 2023-06-04 DIAGNOSIS — K435 Parastomal hernia without obstruction or  gangrene: Secondary | ICD-10-CM

## 2023-06-04 DIAGNOSIS — K9400 Colostomy complication, unspecified: Secondary | ICD-10-CM

## 2023-06-04 DIAGNOSIS — L24B3 Irritant contact dermatitis related to fecal or urinary stoma or fistula: Secondary | ICD-10-CM | POA: Diagnosis not present

## 2023-06-04 DIAGNOSIS — Z933 Colostomy status: Secondary | ICD-10-CM | POA: Diagnosis present

## 2023-06-04 NOTE — Progress Notes (Signed)
Kristin Gibson   Reason for visit:  LUQ colostomy HPI:  Diverticulitis with end colostomy, mucocutaneous separation, resolved  continues to have intermittent leaks.  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   Hypertension    Knee pain, chronic    PONV (postoperative nausea and vomiting)    Pre-diabetes    Sleep apnea    cpap   Family History  Problem Relation Age of Onset   Hypertension Mother    Cancer Sister    Diabetes Sister    Liver disease Neg Hx    Esophageal cancer Neg Hx    Colon cancer Neg Hx    Allergies  Allergen Reactions   Benazepril Swelling   Sulfa Antibiotics Itching and Nausea Only   Elemental Sulfur Itching and Nausea Only   Current Outpatient Medications  Medication Sig Dispense Refill Last Dose   acetaminophen (TYLENOL) 500 MG tablet Take 1,000 mg by mouth daily as needed for mild pain. pain       apixaban (ELIQUIS) 5 MG TABS tablet Take 1 tablet (5 mg total) by mouth 2 (two) times daily. 60 tablet 0    cloNIDine (CATAPRES) 0.1 MG tablet Take 0.1 mg by mouth daily.      dicyclomine (BENTYL) 20 MG tablet Take 20 mg by mouth as needed for spasms.      fluconazole (DIFLUCAN) 150 MG tablet Take 1 tablet (150 mg total) by mouth daily as needed (yeast infection). 3 tablet 0    methocarbamol (ROBAXIN) 500 MG tablet Take 2 tablets (1,000 mg total) by mouth every 8 (eight) hours as needed for muscle spasms. 60 tablet 1    Multiple Vitamin (MULTIVITAMIN) tablet Take 1 tablet by mouth daily.      Multiple Vitamins-Minerals (BARIATRIC FUSION) CHEW Chew 1 tablet by mouth daily.      Na Sulfate-K Sulfate-Mg Sulf 17.5-3.13-1.6 GM/177ML SOLN Take 1 kit by mouth as directed. 354 mL 0    nystatin (MYCOSTATIN) 100000 UNIT/ML suspension Take 5 mLs (500,000 Units total) by mouth 4 (four) times daily as needed (mouth pain). 60 mL 0     ondansetron (ZOFRAN) 4 MG tablet Take 1 tablet (4 mg total) by mouth every 6 (six) hours. 12 tablet 0    ondansetron (ZOFRAN-ODT) 4 MG disintegrating tablet Take 1 tablet (4 mg total) by mouth every 6 (six) hours as needed for nausea or vomiting. (Patient taking differently: Take 4 mg by mouth as needed for nausea or vomiting.) 20 tablet 0    oxyCODONE (ROXICODONE) 5 MG immediate release tablet Take 0.5-1 tablets (2.5-5 mg total) by mouth every 6 (six) hours as needed for severe pain. 12 tablet 0    oxyCODONE 10 MG TABS Take 0.5-1 tablets (5-10 mg total) by mouth every 6 (six) hours as needed for severe pain or moderate pain. 25 tablet 0    pantoprazole (PROTONIX) 40 MG tablet Take 1 tablet (40 mg total) by mouth daily. 90 tablet 0    polyethylene glycol (MIRALAX / GLYCOLAX) 17 g packet Take 17 g by mouth daily.      Probiotic CAPS Take 1 capsule by mouth daily. 30 capsule 0    spironolactone (ALDACTONE) 50 MG tablet Take 50 mg by mouth daily.      No current facility-administered medications for this encounter.   ROS  Review of  Systems  Constitutional:  Positive for fatigue.  Gastrointestinal:        LUQ colostomy Parastomal hernia  Skin:  Positive for rash.  All other systems reviewed and are negative.  Vital signs:  BP (!) 146/69 (BP Location: Right Arm)   Pulse (!) 52   Temp (!) 97.5 F (36.4 C) (Oral)   Resp 20   SpO2 98%  Exam:  Physical Exam Vitals reviewed.  Constitutional:      Appearance: She is obese.  Abdominal:     Palpations: Abdomen is soft.     Hernia: A hernia is present.  Skin:    General: Skin is warm and dry.     Findings: Erythema present.  Neurological:     Mental Status: She is alert and oriented to person, place, and time.  Psychiatric:        Mood and Affect: Mood normal.        Behavior: Behavior normal.     Stoma type/location:  LUQ end colostomy Stomal assessment/size:  1 1/4" stoma Peristomal assessment:  some erythema, leaks on bottom half  of peristomal area, parastomal hernia causes irregular abdominal plane for pouching Treatment options for stomal/peristomal skin: stoma powder, skin prep, barrier ring and 1 piece convex pouch Output: soft brown stool Ostomy pouching: 1pc.. convex  Uses barrier strips to secure pouch    Education provided:  supplier had been sending wrong type barrier ring (Safe n simple) that would not adhere to her skin, causing leaks. She has switched back to hollister ceraplus rings and they perform much better    Impression/dx  Contact dermatitis  with partial thickness skin breakdown  Discussion  Supply issues resolved Plan  See back as needed to check skin irritation    Visit time: 45 minutes.   Maple Hudson FNP-BC

## 2023-06-11 ENCOUNTER — Ambulatory Visit (HOSPITAL_COMMUNITY): Payer: Medicare HMO | Admitting: Nurse Practitioner

## 2023-06-11 DIAGNOSIS — K435 Parastomal hernia without obstruction or  gangrene: Secondary | ICD-10-CM | POA: Insufficient documentation

## 2023-06-11 NOTE — Discharge Instructions (Signed)
Continue stoma powder and skin prep, correct barrier ring

## 2023-06-14 ENCOUNTER — Ambulatory Visit: Payer: Medicare HMO | Admitting: Gastroenterology

## 2023-06-14 ENCOUNTER — Encounter: Payer: Self-pay | Admitting: Gastroenterology

## 2023-06-14 MED ORDER — SODIUM CHLORIDE 0.9 % IV SOLN
500.0000 mL | Freq: Once | INTRAVENOUS | Status: DC
Start: 2023-06-14 — End: 2023-06-14

## 2023-06-14 MED ORDER — NA SULFATE-K SULFATE-MG SULF 17.5-3.13-1.6 GM/177ML PO SOLN: 1.0000 | ORAL | 0 refills | Status: DC

## 2023-06-14 NOTE — Progress Notes (Signed)
Colonoscopy rescheduled for 07/04/2023. Patient did not stop taking Eliquis 2 days prior to procedure as instructed by Cardiology and took dose this am. Dr.Cirigliano aware. Pre procedure instructions reviewed with patient , paper copy provided and patient verbalized understanding.

## 2023-06-14 NOTE — Progress Notes (Signed)
Patient was scheduled for colonoscopy today.  Unfortunately, despite having clearance to hold her Eliquis for procedure today she took her medication still this morning.  We offered to still move forward with the colonoscopy, with the caveat that we would be unlikely to remove any significant polyps due to the active anticoagulation vs rescheduling for a future date when she is holding the anticoagulation.  She opted for the latter and rescheduled colonoscopy for 07/04/2023.  Was given new set of bowel prep instructions.  Doristine Locks, DO, [facility]

## 2023-06-17 ENCOUNTER — Ambulatory Visit (HOSPITAL_COMMUNITY)
Admission: RE | Admit: 2023-06-17 | Discharge: 2023-06-17 | Disposition: A | Discharge status: Home / Self Care | Payer: Medicare HMO | Source: Ambulatory Visit | Attending: Internal Medicine | Admitting: Internal Medicine

## 2023-06-17 DIAGNOSIS — Z433 Encounter for attention to colostomy: Secondary | ICD-10-CM | POA: Diagnosis present

## 2023-06-17 DIAGNOSIS — L24B3 Irritant contact dermatitis related to fecal or urinary stoma or fistula: Secondary | ICD-10-CM

## 2023-06-17 DIAGNOSIS — L259 Unspecified contact dermatitis, unspecified cause: Secondary | ICD-10-CM | POA: Insufficient documentation

## 2023-06-17 DIAGNOSIS — K9400 Colostomy complication, unspecified: Secondary | ICD-10-CM

## 2023-06-17 NOTE — Progress Notes (Signed)
[facility]   Reason for visit:  LLQ colostomy, pouching stable now.  Awaiting reversal HPI:   Past Medical History:  Diagnosis Date  . Arthritis   . Back pain   . Diverticulitis   . GERD (gastroesophageal reflux disease)   . 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 Hypertension   . Knee pain, chronic   . PONV (postoperative nausea and vomiting)   . Pre-diabetes   . Sleep apnea    cpap   Family History  Problem Relation Age of Onset  . Hypertension Mother   . Cancer Sister   . Diabetes Sister   . Liver disease Neg Hx   . Esophageal cancer Neg Hx   . Colon cancer Neg Hx    Allergies  Allergen Reactions  . Benazepril Swelling  . Sulfa Antibiotics Itching and Nausea Only  . Elemental Sulfur Itching and Nausea Only   Current Outpatient Medications  Medication Sig Dispense Refill Last Dose  . acetaminophen (TYLENOL) 500 MG tablet Take 1,000 mg by mouth daily as needed for mild pain. pain      . apixaban (ELIQUIS) 5 MG TABS tablet Take 1 tablet (5 mg total) by mouth 2 (two) times daily. 60 tablet 0   . cloNIDine (CATAPRES) 0.1 MG tablet Take 0.1 mg by mouth daily.     Marland Kitchen dicyclomine (BENTYL) 20 MG tablet Take 20 mg by mouth as needed for spasms.     . fluconazole (DIFLUCAN) 150 MG tablet Take 1 tablet (150 mg total) by mouth daily as needed (yeast infection). 3 tablet 0   . methocarbamol (ROBAXIN) 500 MG tablet Take 2 tablets (1,000 mg total) by mouth every 8 (eight) hours as needed for muscle spasms. 60 tablet 1   . Multiple Vitamin (MULTIVITAMIN) tablet Take 1 tablet by mouth daily.     . Multiple Vitamins-Minerals (BARIATRIC FUSION) CHEW Chew 1 tablet by mouth daily.     . Na Sulfate-K Sulfate-Mg Sulf 17.5-3.13-1.6 GM/177ML SOLN Take 1 kit by mouth as directed. 354 mL 0   . nystatin (MYCOSTATIN) 100000 UNIT/ML suspension Take 5 mLs (500,000 Units total) by mouth 4 (four) times daily as needed (mouth pain). 60 mL 0   . ondansetron (ZOFRAN) 4 MG tablet Take 1  tablet (4 mg total) by mouth every 6 (six) hours. 12 tablet 0   . ondansetron (ZOFRAN-ODT) 4 MG disintegrating tablet Take 1 tablet (4 mg total) by mouth every 6 (six) hours as needed for nausea or vomiting. (Patient taking differently: Take 4 mg by mouth as needed for nausea or vomiting.) 20 tablet 0   . oxyCODONE (ROXICODONE) 5 MG immediate release tablet Take 0.5-1 tablets (2.5-5 mg total) by mouth every 6 (six) hours as needed for severe pain. 12 tablet 0   . oxyCODONE 10 MG TABS Take 0.5-1 tablets (5-10 mg total) by mouth every 6 (six) hours as needed for severe pain or moderate pain. 25 tablet 0   . pantoprazole (PROTONIX) 40 MG tablet Take 1 tablet (40 mg total) by mouth daily. 90 tablet 0   . polyethylene glycol (MIRALAX / GLYCOLAX) 17 g packet Take 17 g by mouth daily.     . Probiotic CAPS Take 1 capsule by mouth daily. 30 capsule 0   . spironolactone (ALDACTONE) 50 MG tablet Take 50 mg by mouth daily.      No current facility-administered medications for this encounter.   ROS  Review of Systems  Gastrointestinal:  LLQ colostomy Contact dermatitis  Skin:  Positive for color change and rash.  All other systems reviewed and are negative. Vital signs:  BP 131/77 (BP Location: Right Arm)   Pulse (!) 49   Temp 97.7 F (36.5 C) (Oral)   Resp 20   SpO2 98%  Exam:  Physical Exam Vitals reviewed.  Constitutional:      Appearance: She is obese.  Abdominal:     Palpations: Abdomen is soft.  Skin:    General: Skin is warm and dry.     Findings: Rash present.  Neurological:     Mental Status: She is alert and oriented to person, place, and time.  Psychiatric:        Mood and Affect: Mood normal.        Behavior: Behavior normal.    Stoma type/location:  LLQ colostomy Stomal assessment/size:  1 3/4"  flush pink and moist   Peristomal assessment:  creasing at 3 and 9 o'clock  filled in with barrier ring pieces and then additional barrier ring around stoma. This creates flat  pouching surface with 1piece convex pouch Treatment options for stomal/peristomal skin: barrier ring, convex pouch, ostomy belt Output: softbrown stool Ostomy pouching: 1pc. Education provided:  supplies are now coming routinely   pouching is going well.  COntact dermatitis from leaks is nearly healed    Impression/dx  Colostomy complication Contact dermatitis  Discussion  See back as needed Follow up with surgery team to discuss reversal Plan  Follow up in clinic as needed.    Visit time:  45 minutes.   Maple Hudson FNP-BC

## 2023-06-17 NOTE — Discharge Instructions (Signed)
[facility]  Uses [employer]ister barrier rings Stoma powder and skin prep to irritation from 1 to 6 o'clock.

## 2023-06-28 ENCOUNTER — Encounter: Payer: Medicare HMO | Admitting: Gastroenterology

## 2023-07-04 ENCOUNTER — Encounter: Payer: Self-pay | Admitting: Gastroenterology

## 2023-07-04 ENCOUNTER — Ambulatory Visit (AMBULATORY_SURGERY_CENTER): Payer: Medicare HMO | Admitting: Gastroenterology

## 2023-07-04 VITALS — BP 125/82 | HR 46 | Temp 97.1°F | Resp 11 | Ht 69.0 in | Wt 246.0 lb

## 2023-07-04 DIAGNOSIS — Z01818 Encounter for other preprocedural examination: Secondary | ICD-10-CM

## 2023-07-04 DIAGNOSIS — K5730 Diverticulosis of large intestine without perforation or abscess without bleeding: Secondary | ICD-10-CM | POA: Diagnosis not present

## 2023-07-04 DIAGNOSIS — D124 Benign neoplasm of descending colon: Secondary | ICD-10-CM | POA: Diagnosis not present

## 2023-07-04 DIAGNOSIS — K635 Polyp of colon: Secondary | ICD-10-CM | POA: Diagnosis not present

## 2023-07-04 DIAGNOSIS — Z1211 Encounter for screening for malignant neoplasm of colon: Secondary | ICD-10-CM

## 2023-07-04 DIAGNOSIS — K640 First degree hemorrhoids: Secondary | ICD-10-CM

## 2023-07-04 MED ORDER — SODIUM CHLORIDE 0.9 % IV SOLN: 500.0000 mL | Freq: Once | INTRAVENOUS | Status: DC

## 2023-07-04 NOTE — Patient Instructions (Addendum)
Await pathology results.  Continue present medications.  Resume previous diet.  Handouts on diverticulosis and polyps provided.  Resume Eliquis tomorrow.  YOU HAD AN ENDOSCOPIC PROCEDURE TODAY AT THE [facility]:   Refer to the procedure report that was given to you for any specific questions about what was found during the examination.  If the procedure report does not answer your questions, please call your gastroenterologist to clarify.  If you requested that your care partner not be given the details of your procedure findings, then the procedure report has been included in a sealed envelope for you to review at your convenience later.  YOU SHOULD EXPECT: Some feelings of bloating in the abdomen. Passage of more gas than usual.  Walking can help get rid of the air that was put into your GI tract during the procedure and reduce the bloating. If you had a lower endoscopy (such as a colonoscopy or flexible sigmoidoscopy) you may notice spotting of blood in your stool or on the toilet paper. If you underwent a bowel prep for your procedure, you may not have a normal bowel movement for a few days.  Please Note:  You might notice some irritation and congestion in your nose or some drainage.  This is from the oxygen used during your procedure.  There is no need for concern and it should clear up in a day or so.  SYMPTOMS TO REPORT IMMEDIATELY:  Following lower endoscopy (colonoscopy or flexible sigmoidoscopy):  Excessive amounts of blood in the stool  Significant tenderness or worsening of abdominal pains  Swelling of the abdomen that is new, acute  Fever of 100F or higher   For urgent or emergent issues, a gastroenterologist can be reached at any hour by calling [phone]. Do not use MyChart messaging for urgent concerns.    DIET:  We do recommend a small meal at first, but then you may proceed to your regular diet.  Drink plenty of fluids but you should avoid alcoholic  beverages for 24 hours.  ACTIVITY:  You should plan to take it easy for the rest of today and you should NOT DRIVE or use heavy machinery until tomorrow (because of the sedation medicines used during the test).    FOLLOW UP: Our staff will call the number listed on your records the next business day following your procedure.  We will call around 7:15- 8:00 am to check on you and address any questions or concerns that you may have regarding the information given to you following your procedure. If we do not reach you, we will leave a message.     If any biopsies were taken you will be contacted by phone or by letter within the next 1-3 weeks.  Please call us at [phone] if you have not heard about the biopsies in 3 weeks.    SIGNATURES/CONFIDENTIALITY: You and/or your care partner have signed paperwork which will be entered into your electronic medical record.  These signatures attest to the fact that that the information above on your After Visit Summary has been reviewed and is understood.  Full responsibility of the confidentiality of this discharge information lies with you and/or your care-partner.

## 2023-07-04 NOTE — Op Note (Signed)
[facility] Patient Name: Kristin Gibson Procedure Date: 07/04/2023 3:15 PM MRN: [id number] Endoscopist: Doristine Locks , MD, [id number] Age: 67 Referring MD:  Date of Birth: 1956/01/08 Gender: Female Account #: [id number] Procedure:                Colonoscopy Indications:              Preoperative assessment, Diverticulosis of the colon                           67 y.o. female with diverticulosis and history of                            diverticulitis, eventually requiring ex lap with                            sigmoid colectomy and descending colostomy in                            08/2022. Last colonoscopy was 2018. She needs repeat                            colonoscopy prior to anticipated colostomy reversal                            later this year. Medicines:                Monitored Anesthesia Care Procedure:                Pre-Anesthesia Assessment:                           - Prior to the procedure, a History and Physical                            was performed, and patient medications and                            allergies were reviewed. The patient's tolerance of                            previous anesthesia was also reviewed. The risks                            and benefits of the procedure and the sedation                            options and risks were discussed with the patient.                            All questions were answered, and informed consent                            was obtained. Prior Anticoagulants: The patient has  taken Eliquis (apixaban), last dose was 2 days                            prior to procedure. ASA Grade Assessment: II - A                            patient with mild systemic disease. After reviewing                            the risks and benefits, the patient was deemed in                            satisfactory condition to undergo the procedure.                           After  obtaining informed consent, the colonoscope                            was passed under direct vision. Throughout the                            procedure, the patient's blood pressure, pulse, and                            oxygen saturations were monitored continuously. The                            Olympus PCF-H190DL (#[id number]) Colonoscope was first                            introduced through the anus and into the                            rectosigmoid colon to the base of the Hartmann                            pouch. The colonoscope was withdrawn slowly, then                            introduced into the descending colostomy and                            advanced to the the cecum, identified by                            appendiceal orifice and ileocecal valve. The                            colonoscopy was performed without difficulty. The                            patient tolerated the procedure well. The quality  of the bowel preparation was good. The ileocecal                            valve, appendiceal orifice, and rectum were                            photographed. Scope In: 3:26:41 PM Scope Out: 3:47:37 PM Scope Withdrawal Time: 0 hours 5 minutes 56 seconds  Total Procedure Duration: 0 hours 20 minutes 56 seconds  Findings:                 The perianal and digital rectal examinations were                            normal.                           The mucosa in the rectum, recto-sigmoid colon and                            base of the Hartmann pouch appeared normal.                           A few medium-mouthed diverticula were found in the                            descending colon and ascending colon.                           A 3 mm polyp was found in the descending colon. The                            polyp was sessile. The polyp was removed with a                            cold snare. Resection and retrieval were complete.                             Estimated blood loss was minimal. Complications:            No immediate complications. Estimated Blood Loss:     Estimated blood loss was minimal. Impression:               - The rectum, recto-sigmoid colon and distal                            sigmoid colon are normal.                           - Diverticulosis in the descending colon and in the                            ascending colon.                           - One 3 mm polyp in the  descending colon, removed                            with a cold snare. Resected and retrieved. Recommendation:           - Patient has a contact number available for                            emergencies. The signs and symptoms of potential                            delayed complications were discussed with the                            patient. Return to normal activities tomorrow.                            Written discharge instructions were provided to the                            patient.                           - Resume previous diet.                           - Continue present medications.                           - Await pathology results.                           - Repeat colonoscopy for surveillance based on                            pathology results.                           - Return to GI office PRN.                           - Follow-up with Dr. Cliffton Asters in the Col[facility]                            [facility] at appointment to be scheduled to                            discuss reanastamosis surgery. Doristine Locks, MD 07/04/2023 4:03:10 PM

## 2023-07-04 NOTE — Progress Notes (Signed)
VS by DT  Pt's states no medical or surgical changes since previsit or office visit.  

## 2023-07-04 NOTE — Progress Notes (Signed)
GASTROENTEROLOGY PROCEDURE H&P NOTE   Primary Care Physician: Woodroe Chen, MD    Reason for Procedure:   Colon cancer screening, complicated diveritculitis, pre-operative evaluation  Plan:    Colonoscopy  Patient is appropriate for endoscopic procedure(s) in the ambulatory ([facility]) setting.  The nature of the procedure, as well as the risks, benefits, and alternatives were carefully and thoroughly reviewed with the patient. Ample time for discussion and questions allowed. The patient understood, was satisfied, and agreed to proceed.     HPI: Kristin Gibson is a 67 y.o. 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  Last colonoscopy was 2018.  She needs repeat colonoscopy prior to anticipated colostomy reversal later this year.   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   Hypertension    Knee pain, chronic    PONV (postoperative nausea and vomiting)    Pre-diabetes    Sleep apnea    cpap    Past Surgical History:  Procedure Laterality Date   ABDOMINAL HYSTERECTOMY     BREAST REDUCTION SURGERY     IR CATHETER TUBE CHANGE  09/12/2022   IR SINUS/FIST TUBE CHK-NON GI  09/18/2022   KNEE SURGERY     LAPAROSCOPIC GASTRIC SLEEVE RESECTION N/A 02/19/2022   Procedure: LAPAROSCOPIC GASTRIC SLEEVE RESECTION, HIATAL HERNIA REPAIR;  Surgeon: Berna Bue, MD;  Location: [facility];  Service: General;  Laterality: N/A;   PARTIAL COLECTOMY N/A 08/31/2022   Procedure: OPEN PARTIAL COLECTOMY WITH COLOSTOMY;  Surgeon: Darnell Level, MD;  Location: [facility];  Service: General;  Laterality: N/A;   right knee replacement      TRIGGER FINGER RELEASE Right 04/2023   thumb, [facility] Dr. Lelon Perla   TUBAL LIGATION     UPPER GI ENDOSCOPY N/A 02/19/2022   Procedure:  UPPER GI ENDOSCOPY;  Surgeon: Berna Bue, MD;  Location: [facility];  Service: General;  Laterality: N/A;    Prior to Admission medications   Medication Sig Start Date End Date Taking? Authorizing Provider  acetaminophen (TYLENOL) 500 MG tablet Take 1,000 mg by mouth daily as needed for mild pain. pain    Yes [provider]  cloNIDine (CATAPRES) 0.1 MG tablet Take 0.1 mg by mouth daily. 10/13/18  Yes [provider]  methocarbamol (ROBAXIN) 500 MG tablet Take 2 tablets (1,000 mg total) by mouth every 8 (eight) hours as needed for muscle spasms. 09/20/22  Yes Meuth, Brooke A, PA-C  Multiple Vitamins-Minerals (BARIATRIC FUSION) CHEW Chew 1 tablet by mouth daily.   Yes [provider]  pantoprazole (PROTONIX) 40 MG tablet Take 1 tablet (40 mg total) by mouth daily. 02/20/22  Yes Berna Bue, MD  spironolactone (ALDACTONE) 50 MG tablet Take 50 mg by mouth daily. 04/20/21  Yes [provider]  apixaban (ELIQUIS) 5 MG TABS tablet Take 1 tablet (5 mg total) by mouth 2 (two) times daily. 09/26/22 04/10/23  Meuth, Brooke A, PA-C  dicyclomine (BENTYL) 20 MG tablet Take 20 mg by mouth as needed for spasms. 12/05/16   [provider]  fluconazole (DIFLUCAN) 150 MG tablet Take 1 tablet (150 mg total) by mouth daily as needed (yeast infection). 09/20/22   Meuth, Brooke A, PA-C  Multiple Vitamin (MULTIVITAMIN) tablet Take 1 tablet by mouth daily.  [provider]  nystatin (MYCOSTATIN) 100000 UNIT/ML suspension Take 5 mLs (500,000 Units total) by mouth 4 (four) times daily as needed (mouth pain). Patient not taking: Reported on 07/04/2023 09/20/22   Carlena Bjornstad A, PA-C  ondansetron (ZOFRAN) 4 MG tablet Take 1 tablet (4 mg total) by mouth every 6 (six) hours. 04/10/23   Glyn Ade, MD  ondansetron (ZOFRAN-ODT) 4 MG disintegrating tablet Take 1 tablet (4 mg total) by mouth every 6 (six) hours as needed for nausea or vomiting. Patient taking  differently: Take 4 mg by mouth as needed for nausea or vomiting. 02/20/22   Berna Bue, MD  oxyCODONE (ROXICODONE) 5 MG immediate release tablet Take 0.5-1 tablets (2.5-5 mg total) by mouth every 6 (six) hours as needed for severe pain. 04/10/23   Glyn Ade, MD  oxyCODONE 10 MG TABS Take 0.5-1 tablets (5-10 mg total) by mouth every 6 (six) hours as needed for severe pain or moderate pain. 09/20/22   Meuth, Brooke A, PA-C  polyethylene glycol (MIRALAX / GLYCOLAX) 17 g packet Take 17 g by mouth daily.    [provider]  Probiotic CAPS Take 1 capsule by mouth daily. 06/28/20   Ward, Layla Maw, DO    Current Outpatient Medications  Medication Sig Dispense Refill   acetaminophen (TYLENOL) 500 MG tablet Take 1,000 mg by mouth daily as needed for mild pain. pain      cloNIDine (CATAPRES) 0.1 MG tablet Take 0.1 mg by mouth daily.     methocarbamol (ROBAXIN) 500 MG tablet Take 2 tablets (1,000 mg total) by mouth every 8 (eight) hours as needed for muscle spasms. 60 tablet 1   Multiple Vitamins-Minerals (BARIATRIC FUSION) CHEW Chew 1 tablet by mouth daily.     pantoprazole (PROTONIX) 40 MG tablet Take 1 tablet (40 mg total) by mouth daily. 90 tablet 0   spironolactone (ALDACTONE) 50 MG tablet Take 50 mg by mouth daily.     apixaban (ELIQUIS) 5 MG TABS tablet Take 1 tablet (5 mg total) by mouth 2 (two) times daily. 60 tablet 0   dicyclomine (BENTYL) 20 MG tablet Take 20 mg by mouth as needed for spasms.     fluconazole (DIFLUCAN) 150 MG tablet Take 1 tablet (150 mg total) by mouth daily as needed (yeast infection). 3 tablet 0   Multiple Vitamin (MULTIVITAMIN) tablet Take 1 tablet by mouth daily.     nystatin (MYCOSTATIN) 100000 UNIT/ML suspension Take 5 mLs (500,000 Units total) by mouth 4 (four) times daily as needed (mouth pain). (Patient not taking: Reported on 07/04/2023) 60 mL 0   ondansetron (ZOFRAN) 4 MG tablet Take 1 tablet (4 mg total) by mouth every 6 (six) hours. 12 tablet 0    ondansetron (ZOFRAN-ODT) 4 MG disintegrating tablet Take 1 tablet (4 mg total) by mouth every 6 (six) hours as needed for nausea or vomiting. (Patient taking differently: Take 4 mg by mouth as needed for nausea or vomiting.) 20 tablet 0   oxyCODONE (ROXICODONE) 5 MG immediate release tablet Take 0.5-1 tablets (2.5-5 mg total) by mouth every 6 (six) hours as needed for severe pain. 12 tablet 0   oxyCODONE 10 MG TABS Take 0.5-1 tablets (5-10 mg total) by mouth every 6 (six) hours as needed for severe pain or moderate pain. 25 tablet 0   polyethylene glycol (MIRALAX / GLYCOLAX) 17 g packet Take 17 g by mouth daily.     Probiotic CAPS Take 1 capsule by mouth daily. 30 capsule 0  Current Facility-Administered Medications  Medication Dose Route Frequency Provider Last Rate Last Admin   0.9 %  sodium chloride infusion  500 mL Intravenous Once Berdell Hostetler V, DO        Allergies as of 07/04/2023 - Review Complete 07/04/2023  Allergen Reaction Noted   Benazepril Swelling 03/18/2018   Sulfa antibiotics Itching and Nausea Only 02/24/2017   Elemental sulfur Itching and Nausea Only 10/11/2015    Family History  Problem Relation Age of Onset   Hypertension Mother    Cancer Sister    Diabetes Sister    Liver disease Neg Hx    Esophageal cancer Neg Hx    Colon cancer Neg Hx    Rectal cancer Neg Hx    Stomach cancer Neg Hx     Social History   Socioeconomic History   Marital status: Divorced    Spouse name: Not on file   Number of children: 2   Years of education: Not on file   Highest education level: Not on file  Occupational History   Occupation: retired  Tobacco Use   Smoking status: Former   Smokeless tobacco: Never  Vaping Use   Vaping status: Never Used  Substance and Sexual Activity   Alcohol use: Yes    Comment: occasionally   Drug use: No   Sexual activity: Yes    Birth control/protection: Surgical  Other Topics Concern   Not on file  Social History Narrative    Not on file   Social Determinants of Health   Financial Resource Strain: High Risk (01/29/2023)   Received from [facility]   Overall Financial Resource Strain (CARDIA)    Difficulty of Paying Living Expenses: Very hard  Food Insecurity: Food Insecurity Present (01/29/2023)   Received from [facility]   Hunger Vital Sign    Worried About Running Out of Food in the Last Year: Sometimes true    Ran Out of Food in the Last Year: Sometimes true  Transportation Needs: No Transportation Needs (01/29/2023)   Received from [facility] - Transportation    Lack of Transportation (Medical): No    Lack of Transportation (Non-Medical): No  Physical Activity: Insufficiently Active (12/18/2022)   Received from [facility]   Exercise Vital Sign    Days of Exercise per Week: 1 day    Minutes of Exercise per Session: 20 min  Stress: No Stress Concern Present (04/29/2023)   Received from [facility] - Occupational Stress Questionnaire    Feeling of Stress : Not at all  Social Connections: Socially Integrated (12/18/2022)   Received from [facility]   Social Network    How would you rate your social network (family, work, friends)?: Good participation with social networks  Intimate Partner Violence: Not At Risk (04/29/2023)   Received from [facility]   HITS    Over the last 12 months how often did your partner physically hurt you?: 1    Over the last 12 months how often did your partner insult you or talk down to you?: 1    Over the last 12 months how often did your partner threaten you with physical harm?: 1    Over the last 12 months how often did your partner scream or curse at you?: 1    Physical Exam: Vital signs in last 24 hours: @BP  (!) 144/85   Pulse (!) 44   Temp (!) 97.1 F (36.2 C)  Ht 5\' 9"  (1.753 m)   Wt 246 lb (111.6 kg)   SpO2 98%   BMI 36.33 kg/m  GEN: NAD EYE: Sclerae anicteric ENT: MMM CV:  Non-tachycardic Pulm: CTA b/l GI: Soft, NT/ND NEURO:  Alert & Oriented x 3   Doristine Locks, DO [facility]   07/04/2023 3:17 PM

## 2023-07-04 NOTE — Progress Notes (Signed)
Sedate, gd SR, tolerated procedure well, VSS, report to RN 

## 2023-07-04 NOTE — Progress Notes (Signed)
Called to room to assist during endoscopic procedure.  Patient ID and intended procedure confirmed with present staff. Received instructions for my participation in the procedure from the performing physician.  

## 2023-07-05 ENCOUNTER — Telehealth: Payer: Self-pay | Admitting: *Deleted

## 2023-07-05 ENCOUNTER — Telehealth: Payer: Self-pay | Admitting: Gastroenterology

## 2023-07-05 NOTE — Telephone Encounter (Signed)
Inbound call from patient stating she has started vomiting and very nauseous. States when she received her follow up call she was not experiencing these symptoms. Requesting for a call back. Please advise, thank you.

## 2023-07-05 NOTE — Telephone Encounter (Signed)
  Follow up Call-     07/04/2023    2:35 PM  Call back number  Post procedure Call Back phone  # [phone]  Permission to leave phone message Yes     Patient questions:  Do you have a fever, pain , or abdominal swelling? No. Pain Score  0 *  Have you tolerated food without any problems? Yes.    Have you been able to return to your normal activities? Yes.    Do you have any questions about your discharge instructions: Diet   No. Medications  No. Follow up visit  No.  Do you have questions or concerns about your Care? No.  Actions: * If pain score is 4 or above: No action needed, pain <4.

## 2023-07-05 NOTE — Telephone Encounter (Signed)
At this point, I agree with conservative measures, including modified diet, rest, and antiemetics as needed.

## 2023-07-05 NOTE — Telephone Encounter (Signed)
Returned call to patient. She reports having nausea and some dry heaving after taking sips of fluids. Patient states that she is passing gas, She report having  nausea in the past after sedation. She took Zofran 4 mg PO at about 10:00 am today. Encouraged patient to continue with the PO fluids intake, eat toast and call us back in afternoon if symptoms persist. She agreed to POC.

## 2023-07-10 NOTE — Telephone Encounter (Signed)
Called and spoke with patient. Pt is feeling well at this time. Nausea has resolved. Pt is aware that we will be in touch with her path results. Pt had no concerns at the end of the call.

## 2023-07-16 ENCOUNTER — Ambulatory Visit: Payer: Medicare HMO | Admitting: Skilled Nursing Facility1

## 2023-07-31 ENCOUNTER — Other Ambulatory Visit: Payer: Self-pay | Admitting: Urology

## 2023-08-19 ENCOUNTER — Ambulatory Visit: Payer: Self-pay | Admitting: Surgery

## 2023-08-19 NOTE — Progress Notes (Signed)
Surgery orders requested via Epic inbox. °

## 2023-08-23 NOTE — Patient Instructions (Addendum)
DUE TO COVID-19 ONLY TWO VISITORS  (aged 67 and older)  ARE ALLOWED TO COME WITH YOU AND STAY IN THE WAITING ROOM ONLY DURING PRE OP AND PROCEDURE.   **NO VISITORS ARE ALLOWED IN THE SHORT STAY AREA OR RECOVERY ROOM!!**  IF YOU WILL BE ADMITTED INTO THE HOSPITAL YOU ARE ALLOWED ONLY FOUR SUPPORT PEOPLE DURING VISITATION HOURS ONLY (7 AM -8PM)   The support person(s) must pass our screening, gel in and out, and wear a mask at all times, including in the patient's room. Patients must also wear a mask when staff or their support person are in the room. Visitors GUEST BADGE MUST BE WORN VISIBLY  One adult visitor may remain with you overnight and MUST be in the room by 8 P.M.     Your procedure is scheduled on: 09/05/23   Report to [facility] Main Entrance    Report to admitting at : 10:15 AM   Call this number if you have problems the morning of surgery [phone]   Clear liquids diet starting the day before surgery until : 9:30 AM DAY OF SURGERY.  Water Black Coffee (sugar ok, NO MILK/CREAM OR CREAMERS)  Tea (sugar ok, NO MILK/CREAM OR CREAMERS) regular and decaf                             Plain Jell-O (NO RED)                                           Fruit ices (not with fruit pulp, NO RED)                                     Popsicles (NO RED)                                                                  Juice: apple, WHITE grape, WHITE cranberry Sports drinks like Gatorade (NO RED)              Drink 2 Ensure/G2 drinks AT 10:00 PM the night before surgery.       The day of surgery:  Drink ONE (1) Pre-Surgery Clear G2 at : 9:30 AM the morning of surgery. Drink in one sitting. Do not sip.  This drink was given to you during your hospital  pre-op appointment visit. Nothing else to drink after completing the  Pre-Surgery Clear Ensure or G2.          If you have questions, please contact your surgeon's office.  FOLLOW BOWEL PREP AND ANY ADDITIONAL PRE OP  INSTRUCTIONS YOU RECEIVED FROM YOUR SURGEON'S OFFICE!!!   Oral Hygiene is also important to reduce your risk of infection.                                    Remember - BRUSH YOUR TEETH THE MORNING OF SURGERY WITH YOUR REGULAR TOOTHPASTE  DENTURES WILL BE REMOVED PRIOR TO SURGERY PLEASE  DO NOT APPLY "Poly grip" OR ADHESIVES!!!   Do NOT smoke after Midnight   Take these medicines the morning of surgery with A SIP OF WATER: pantoprazole.  DO NOT TAKE ANY ORAL DIABETIC MEDICATIONS DAY OF YOUR SURGERY  Bring CPAP mask and tubing day of surgery.                              You may not have any metal on your body including hair pins, jewelry, and body piercing             Do not wear make-up, lotions, powders, perfumes/cologne, or deodorant  Do not wear nail polish including gel and S&S, artificial/acrylic nails, or any other type of covering on natural nails including finger and toenails. If you have artificial nails, gel coating, etc. that needs to be removed by a nail salon please have this removed prior to surgery or surgery may need to be canceled/ delayed if the surgeon/ anesthesia feels like they are unable to be safely monitored.   Do not shave  48 hours prior to surgery.    Do not bring valuables to the hospital. [facility] IS NOT             RESPONSIBLE   FOR VALUABLES.   Contacts, glasses, or bridgework may not be worn into surgery.   Bring small overnight bag day of surgery.   DO NOT BRING YOUR HOME MEDICATIONS TO THE HOSPITAL. PHARMACY WILL DISPENSE MEDICATIONS LISTED ON YOUR MEDICATION LIST TO YOU DURING YOUR ADMISSION IN THE HOSPITAL!    Patients discharged on the day of surgery will not be allowed to drive home.  Someone NEEDS to stay with you for the first 24 hours after anesthesia.   Special Instructions: Bring a copy of your healthcare power of attorney and living will documents         the day of surgery if you haven't scanned them before.              Please read  over the following fact sheets you were given: IF YOU HAVE QUESTIONS ABOUT YOUR PRE-OP INSTRUCTIONS PLEASE CALL [phone]    [facility] - Preparing for Surgery Before surgery, you can play an important role.  Because skin is not sterile, your skin needs to be as free of germs as possible.  You can reduce the number of germs on your skin by washing with CHG (chlorahexidine gluconate) soap before surgery.  CHG is an antiseptic cleaner which kills germs and bonds with the skin to continue killing germs even after washing. Please DO NOT use if you have an allergy to CHG or antibacterial soaps.  If your skin becomes reddened/irritated stop using the CHG and inform your nurse when you arrive at Short Stay. Do not shave (including legs and underarms) for at least 48 hours prior to the first CHG shower.  You may shave your face/neck. Please follow these instructions carefully:  1.  Shower with CHG Soap the night before surgery and the  morning of Surgery.  2.  If you choose to wash your hair, wash your hair first as usual with your  normal  shampoo.  3.  After you shampoo, rinse your hair and body thoroughly to remove the  shampoo.                           4.  Use CHG as you would any other liquid soap.  You can apply chg directly  to the skin and wash                       Gently with a scrungie or clean washcloth.  5.  Apply the CHG Soap to your body ONLY FROM THE NECK DOWN.   Do not use on face/ open                           Wound or open sores. Avoid contact with eyes, ears mouth and genitals (private parts).                       Wash face,  Genitals (private parts) with your normal soap.             6.  Wash thoroughly, paying special attention to the area where your surgery  will be performed.  7.  Thoroughly rinse your body with warm water from the neck down.  8.  DO NOT shower/wash with your normal soap after using and rinsing off  the CHG Soap.                9.  Pat yourself dry with a  clean towel.            10.  Wear clean pajamas.            11.  Place clean sheets on your bed the night of your first shower and do not  sleep with pets. Day of Surgery : Do not apply any lotions/deodorants the morning of surgery.  Please wear clean clothes to the hospital/surgery center.  FAILURE TO FOLLOW THESE INSTRUCTIONS MAY RESULT IN THE CANCELLATION OF YOUR SURGERY PATIENT SIGNATURE_________________________________  NURSE SIGNATURE__________________________________  ________________________________________________________________________  Kristin Gibson  An incentive spirometer is a tool that can help keep your lungs clear and active. This tool measures how well you are filling your lungs with each breath. Taking long deep breaths may help reverse or decrease the chance of developing breathing (pulmonary) problems (especially infection) following: A long period of time when you are unable to move or be active. BEFORE THE PROCEDURE  If the spirometer includes an indicator to show your best effort, your nurse or respiratory therapist will set it to a desired goal. If possible, sit up straight or lean slightly forward. Try not to slouch. Hold the incentive spirometer in an upright position. INSTRUCTIONS FOR USE  Sit on the edge of your bed if possible, or sit up as far as you can in bed or on a chair. Hold the incentive spirometer in an upright position. Breathe out normally. Place the mouthpiece in your mouth and seal your lips tightly around it. Breathe in slowly and as deeply as possible, raising the piston or the ball toward the top of the column. Hold your breath for 3-5 seconds or for as long as possible. Allow the piston or ball to fall to the bottom of the column. Remove the mouthpiece from your mouth and breathe out normally. Rest for a few seconds and repeat Steps 1 through 7 at least 10 times every 1-2 hours when you are awake. Take your time and take a few normal  breaths between deep breaths. The spirometer may include an indicator to show your best effort. Use the indicator as a goal to work toward during each repetition.  After each set of 10 deep breaths, practice coughing to be sure your lungs are clear. If you have an incision (the cut made at the time of surgery), support your incision when coughing by placing a pillow or rolled up towels firmly against it. Once you are able to get out of bed, walk around indoors and cough well. You may stop using the incentive spirometer when instructed by your caregiver.  RISKS AND COMPLICATIONS Take your time so you do not get dizzy or light-headed. If you are in pain, you may need to take or ask for pain medication before doing incentive spirometry. It is harder to take a deep breath if you are having pain. AFTER USE Rest and breathe slowly and easily. It can be helpful to keep track of a log of your progress. Your caregiver can provide you with a simple table to help with this. If you are using the spirometer at home, follow these instructions: SEEK MEDICAL CARE IF:  You are having difficultly using the spirometer. You have trouble using the spirometer as often as instructed. Your pain medication is not giving enough relief while using the spirometer. You develop fever of 100.5 F (38.1 C) or higher. SEEK IMMEDIATE MEDICAL CARE IF:  You cough up bloody sputum that had not been present before. You develop fever of 102 F (38.9 C) or greater. You develop worsening pain at or near the incision site. MAKE SURE YOU:  Understand these instructions. Will watch your condition. Will get help right away if you are not doing well or get worse. Document Released: 04/22/2007 Document Revised: 03/03/2012 Document Reviewed: 06/23/2007 [employer] Patient Information 2014 [employer], [place].   ________________________________________________________________________  Incentive Spirometer  An incentive spirometer is a  tool that can help keep your lungs clear and active. This tool measures how well you are filling your lungs with each breath. Taking long deep breaths may help reverse or decrease the chance of developing breathing (pulmonary) problems (especially infection) following: A long period of time when you are unable to move or be active. BEFORE THE PROCEDURE  If the spirometer includes an indicator to show your best effort, your nurse or respiratory therapist will set it to a desired goal. If possible, sit up straight or lean slightly forward. Try not to slouch. Hold the incentive spirometer in an upright position. INSTRUCTIONS FOR USE  Sit on the edge of your bed if possible, or sit up as far as you can in bed or on a chair. Hold the incentive spirometer in an upright position. Breathe out normally. Place the mouthpiece in your mouth and seal your lips tightly around it. Breathe in slowly and as deeply as possible, raising the piston or the ball toward the top of the column. Hold your breath for 3-5 seconds or for as long as possible. Allow the piston or ball to fall to the bottom of the column. Remove the mouthpiece from your mouth and breathe out normally. Rest for a few seconds and repeat Steps 1 through 7 at least 10 times every 1-2 hours when you are awake. Take your time and take a few normal breaths between deep breaths. The spirometer may include an indicator to show your best effort. Use the indicator as a goal to work toward during each repetition. After each set of 10 deep breaths, practice coughing to be sure your lungs are clear. If you have an incision (the cut made at the time of surgery), support your incision when coughing by  placing a pillow or rolled up towels firmly against it. Once you are able to get out of bed, walk around indoors and cough well. You may stop using the incentive spirometer when instructed by your caregiver.  RISKS AND COMPLICATIONS Take your time so you do not  get dizzy or light-headed. If you are in pain, you may need to take or ask for pain medication before doing incentive spirometry. It is harder to take a deep breath if you are having pain. AFTER USE Rest and breathe slowly and easily. It can be helpful to keep track of a log of your progress. Your caregiver can provide you with a simple table to help with this. If you are using the spirometer at home, follow these instructions: SEEK MEDICAL CARE IF:  You are having difficultly using the spirometer. You have trouble using the spirometer as often as instructed. Your pain medication is not giving enough relief while using the spirometer. You develop fever of 100.5 F (38.1 C) or higher. SEEK IMMEDIATE MEDICAL CARE IF:  You cough up bloody sputum that had not been present before. You develop fever of 102 F (38.9 C) or greater. You develop worsening pain at or near the incision site. MAKE SURE YOU:  Understand these instructions. Will watch your condition. Will get help right away if you are not doing well or get worse. Document Released: 04/22/2007 Document Revised: 03/03/2012 Document Reviewed: 06/23/2007 [facility] Patient Information 2014 [place], [place].   ________________________________________________________________________

## 2023-08-27 ENCOUNTER — Encounter (HOSPITAL_COMMUNITY): Payer: Self-pay

## 2023-08-27 ENCOUNTER — Encounter (HOSPITAL_COMMUNITY)
Admission: RE | Admit: 2023-08-27 | Discharge: 2023-08-27 | Disposition: A | Discharge status: Home / Self Care | Payer: Medicare HMO | Source: Ambulatory Visit | Attending: Surgery | Admitting: Surgery

## 2023-08-27 ENCOUNTER — Other Ambulatory Visit: Payer: Self-pay

## 2023-08-27 VITALS — BP 172/101 | HR 50 | Temp 98.6°F | Ht 69.0 in | Wt 250.0 lb

## 2023-08-27 DIAGNOSIS — I10 Essential (primary) hypertension: Secondary | ICD-10-CM | POA: Diagnosis not present

## 2023-08-27 DIAGNOSIS — Z01812 Encounter for preprocedural laboratory examination: Secondary | ICD-10-CM | POA: Insufficient documentation

## 2023-08-27 HISTORY — DX: Low back pain, unspecified: M54.50

## 2023-08-27 LAB — COMPREHENSIVE METABOLIC PANEL
ALT: 15 U/L (ref 0–44)
AST: 20 U/L (ref 15–41)
Albumin: 3.9 g/dL (ref 3.5–5.0)
Alkaline Phosphatase: 76 U/L (ref 38–126)
Anion gap: 8 (ref 5–15)
BUN: 17 mg/dL (ref 8–23)
CO2: 23 mmol/L (ref 22–32)
Calcium: 8.9 mg/dL (ref 8.9–10.3)
Chloride: 108 mmol/L (ref 98–111)
Creatinine, Ser: 0.85 mg/dL (ref 0.44–1.00)
GFR, Estimated: >60 mL/min (ref >60)
Glucose, Bld: 82 mg/dL (ref 70–99)
Potassium: 3.9 mmol/L (ref 3.5–5.1)
Sodium: 139 mmol/L (ref 135–145)
Total Bilirubin: 0.6 mg/dL (ref 0.3–1.2)
Total Protein: 7.5 g/dL (ref 6.5–8.1)

## 2023-08-27 LAB — CBC
HCT: 42 % (ref 36.0–46.0)
Hemoglobin: 13.1 g/dL (ref 12.0–15.0)
MCH: 29 pg (ref 26.0–34.0)
MCHC: 31.2 g/dL (ref 30.0–36.0)
MCV: 93.1 fL (ref 80.0–100.0)
Platelets: 181 10*3/uL (ref 150–400)
RBC: 4.51 MIL/uL (ref 3.87–5.11)
RDW: 13.8 % (ref 11.5–15.5)
WBC: 3.5 10*3/uL — ABNORMAL LOW (ref 4.0–10.5)
nRBC: 0 % (ref 0.0–0.2)

## 2023-08-27 NOTE — Progress Notes (Signed)
For Short Stay: COVID SWAB appointment date:  Bowel Prep reminder:   For Anesthesia: PCP -Johns, Harriett Sine, M. LOV: 08/06/23 Cardiologist - N/A  Chest x-ray - 09/24/22 EKG - 04/10/23 Stress Test -  ECHO -  Cardiac Cath -  Pacemaker/ICD device last checked: Pacemaker orders received: Device Rep notified:  Spinal Cord Stimulator:N/A  Sleep Study - Yes CPAP - Yes  Fasting Blood Sugar - N/A Checks Blood Sugar _____ times a day Date and result of last Hgb A1c- 6.0: 08/06/23  Last dose of GLP1 agonist- N/A GLP1 instructions:   Last dose of SGLT-2 inhibitors- N/A SGLT-2 instructions:   Blood Thinner Instructions: N/A Aspirin Instructions: Last Dose:  Activity level: Can go up a flight of stairs and activities of daily living without stopping and without chest pain and/or shortness of breath   Able to exercise without chest pain and/or shortness of breath  Anesthesia review: Hx: HTN,Pre-DIA,OSA(CPAP),DVT.  Patient denies shortness of breath, fever, cough and chest pain at PAT appointment   Patient verbalized understanding of instructions that were given to them at the PAT appointment. Patient was also instructed that they will need to review over the PAT instructions again at home before surgery.

## 2023-09-04 NOTE — Anesthesia Preprocedure Evaluation (Signed)
Anesthesia Evaluation  Patient identified by MRN, date of birth, ID band Patient awake    Reviewed: Allergy & Precautions, NPO status , Patient's Chart, lab work & pertinent test results  History of Anesthesia Complications (+) PONV and history of anesthetic complications  Airway Mallampati: I  TM Distance: >3 FB Neck ROM: Full   Comment: Previous grade I view with MAC 4 Dental  (+) Dental Advisory Given   Pulmonary neg shortness of breath, sleep apnea and Continuous Positive Airway Pressure Ventilation , neg COPD, neg recent URI, former smoker   Pulmonary exam normal breath sounds clear to auscultation       Cardiovascular hypertension (spironolactone), Pt. 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   Endo/Other  Pre-diabetes  Renal/GU negative Renal ROS Bladder dysfunction      Musculoskeletal  (+) Arthritis , Osteoarthritis,    Abdominal  (+) + obese  Peds  Hematology  (+) Blood dyscrasia, anemia Lab Results      Component                Value               Date                      WBC                      3.5 (L)             08/27/2023                HGB                      13.1                08/27/2023                HCT                      42.0                08/27/2023                MCV                      93.1                08/27/2023                PLT                      181                 08/27/2023              Anesthesia Other Findings 67 y.o. 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   Reproductive/Obstetrics                              Anesthesia Physical Anesthesia Plan  ASA: 3  Anesthesia Plan: General  Post-op Pain Management: Tylenol PO (pre-op)*   Induction: Intravenous  PONV Risk Score and Plan: 4 or greater and Ondansetron, Dexamethasone and Treatment may vary due to age or medical condition  Airway Management Planned: Oral ETT  Additional Equipment:   Intra-op Plan:   Post-operative Plan: Extubation in OR  Informed Consent: I have reviewed the patients History and Physical, chart, labs and discussed the procedure including the risks, benefits and alternatives for the proposed anesthesia with the patient or authorized representative who has indicated his/her understanding and acceptance.     Dental advisory given  Plan Discussed with: CRNA and Anesthesiologist  Anesthesia Plan Comments: (Risks of general anesthesia discussed including, but not limited to, sore throat, hoarse voice, chipped/damaged teeth, injury to vocal cords, nausea and vomiting, allergic reactions, lung infection, heart attack, stroke, and death. All questions answered. )       Anesthesia Quick Evaluation

## 2023-09-05 ENCOUNTER — Inpatient Hospital Stay (HOSPITAL_COMMUNITY): Payer: Medicare HMO | Admitting: Anesthesiology

## 2023-09-05 ENCOUNTER — Encounter (HOSPITAL_COMMUNITY)
Admission: RE | Disposition: A | Discharge status: Home / Self Care | Payer: Self-pay | Source: Home / Self Care | Attending: Surgery

## 2023-09-05 ENCOUNTER — Other Ambulatory Visit: Payer: Self-pay

## 2023-09-05 ENCOUNTER — Encounter (HOSPITAL_COMMUNITY): Payer: Self-pay | Admitting: Surgery

## 2023-09-05 ENCOUNTER — Inpatient Hospital Stay (HOSPITAL_COMMUNITY): Payer: Self-pay | Admitting: Anesthesiology

## 2023-09-05 ENCOUNTER — Inpatient Hospital Stay (HOSPITAL_COMMUNITY)
Admission: RE | Admit: 2023-09-05 | Discharge: 2023-09-11 | DRG: 330 | Disposition: A | Discharge status: Home / Self Care | Payer: Medicare HMO | Attending: Surgery | Admitting: Surgery

## 2023-09-05 DIAGNOSIS — Y838 Other surgical procedures as the cause of abnormal reaction of the patient, or of later complication, without mention of misadventure at the time of the procedure: Secondary | ICD-10-CM | POA: Diagnosis present

## 2023-09-05 DIAGNOSIS — Z96651 Presence of right artificial knee joint: Secondary | ICD-10-CM | POA: Diagnosis present

## 2023-09-05 DIAGNOSIS — Z7901 Long term (current) use of anticoagulants: Secondary | ICD-10-CM

## 2023-09-05 DIAGNOSIS — I4891 Unspecified atrial fibrillation: Secondary | ICD-10-CM | POA: Diagnosis present

## 2023-09-05 DIAGNOSIS — Z8249 Family history of ischemic heart disease and other diseases of the circulatory system: Secondary | ICD-10-CM | POA: Diagnosis not present

## 2023-09-05 DIAGNOSIS — G4730 Sleep apnea, unspecified: Secondary | ICD-10-CM | POA: Diagnosis not present

## 2023-09-05 DIAGNOSIS — Z9884 Bariatric surgery status: Secondary | ICD-10-CM

## 2023-09-05 DIAGNOSIS — K219 Gastro-esophageal reflux disease without esophagitis: Secondary | ICD-10-CM | POA: Diagnosis present

## 2023-09-05 DIAGNOSIS — K5900 Constipation, unspecified: Secondary | ICD-10-CM | POA: Diagnosis present

## 2023-09-05 DIAGNOSIS — S36439A Laceration of unspecified part of small intestine, initial encounter: Secondary | ICD-10-CM | POA: Diagnosis present

## 2023-09-05 DIAGNOSIS — Z90710 Acquired absence of both cervix and uterus: Secondary | ICD-10-CM | POA: Diagnosis not present

## 2023-09-05 DIAGNOSIS — Z888 Allergy status to other drugs, medicaments and biological substances status: Secondary | ICD-10-CM

## 2023-09-05 DIAGNOSIS — K9181 Other intraoperative complications of digestive system: Secondary | ICD-10-CM | POA: Diagnosis present

## 2023-09-05 DIAGNOSIS — Z86010 Personal history of colonic polyps: Secondary | ICD-10-CM | POA: Diagnosis not present

## 2023-09-05 DIAGNOSIS — Z87891 Personal history of nicotine dependence: Secondary | ICD-10-CM

## 2023-09-05 DIAGNOSIS — K660 Peritoneal adhesions (postprocedural) (postinfection): Secondary | ICD-10-CM | POA: Diagnosis present

## 2023-09-05 DIAGNOSIS — Z882 Allergy status to sulfonamides status: Secondary | ICD-10-CM | POA: Diagnosis not present

## 2023-09-05 DIAGNOSIS — Z9049 Acquired absence of other specified parts of digestive tract: Secondary | ICD-10-CM

## 2023-09-05 DIAGNOSIS — Z933 Colostomy status: Secondary | ICD-10-CM | POA: Diagnosis not present

## 2023-09-05 DIAGNOSIS — Z79899 Other long term (current) drug therapy: Secondary | ICD-10-CM | POA: Diagnosis not present

## 2023-09-05 DIAGNOSIS — K435 Parastomal hernia without obstruction or  gangrene: Secondary | ICD-10-CM | POA: Diagnosis present

## 2023-09-05 DIAGNOSIS — Z433 Encounter for attention to colostomy: Secondary | ICD-10-CM | POA: Diagnosis present

## 2023-09-05 DIAGNOSIS — Z833 Family history of diabetes mellitus: Secondary | ICD-10-CM

## 2023-09-05 DIAGNOSIS — R7303 Prediabetes: Secondary | ICD-10-CM | POA: Diagnosis present

## 2023-09-05 DIAGNOSIS — I10 Essential (primary) hypertension: Secondary | ICD-10-CM | POA: Diagnosis present

## 2023-09-05 DIAGNOSIS — K5730 Diverticulosis of large intestine without perforation or abscess without bleeding: Secondary | ICD-10-CM | POA: Diagnosis present

## 2023-09-05 DIAGNOSIS — Z9889 Other specified postprocedural states: Principal | ICD-10-CM

## 2023-09-05 HISTORY — PX: FLEXIBLE SIGMOIDOSCOPY: SHX5431

## 2023-09-05 HISTORY — PX: XI ROBOTIC ASSISTED COLOSTOMY TAKEDOWN: SHX6828

## 2023-09-05 LAB — GLUCOSE, CAPILLARY: Glucose-Capillary: 149 mg/dL — ABNORMAL HIGH (ref 70–99)

## 2023-09-05 LAB — TYPE AND SCREEN
ABO/RH(D): O POS
Antibody Screen: NEGATIVE

## 2023-09-05 SURGERY — CLOSURE, COLOSTOMY, ROBOT-ASSISTED
Anesthesia: General

## 2023-09-05 MED ORDER — EPHEDRINE SULFATE-NACL 50-0.9 MG/10ML-% IV SOSY
PREFILLED_SYRINGE | INTRAVENOUS | Status: DC | PRN
  Administered 2023-09-05: 10 mg via INTRAVENOUS

## 2023-09-05 MED ORDER — MIDAZOLAM HCL 2 MG/2ML IJ SOLN
INTRAMUSCULAR | Status: AC
  Filled 2023-09-05: qty 2

## 2023-09-05 MED ORDER — ALUM & MAG HYDROXIDE-SIMETH 200-200-20 MG/5ML PO SUSP
30.0000 mL | Freq: Four times a day (QID) | ORAL | Status: DC | PRN
  Administered 2023-09-10: 30 mL via ORAL
  Filled 2023-09-05: qty 30

## 2023-09-05 MED ORDER — MELATONIN 3 MG PO TABS: 3.0000 mg | ORAL_TABLET | Freq: Every evening | ORAL | Status: DC | PRN

## 2023-09-05 MED ORDER — SODIUM CHLORIDE 0.9 % IV SOLN
2.0000 g | INTRAVENOUS | Status: AC
  Administered 2023-09-05: 2 g via INTRAVENOUS
  Filled 2023-09-05: qty 2

## 2023-09-05 MED ORDER — DEXAMETHASONE SODIUM PHOSPHATE 10 MG/ML IJ SOLN
INTRAMUSCULAR | Status: AC
  Filled 2023-09-05: qty 1

## 2023-09-05 MED ORDER — AMISULPRIDE (ANTIEMETIC) 5 MG/2ML IV SOLN: 10.0000 mg | Freq: Once | INTRAVENOUS | Status: DC | PRN

## 2023-09-05 MED ORDER — ALVIMOPAN 12 MG PO CAPS
12.0000 mg | ORAL_CAPSULE | Freq: Two times a day (BID) | ORAL | Status: DC
  Administered 2023-09-06 – 2023-09-07 (×3): 12 mg via ORAL
  Filled 2023-09-05 (×3): qty 1

## 2023-09-05 MED ORDER — DEXAMETHASONE SODIUM PHOSPHATE 10 MG/ML IJ SOLN
INTRAMUSCULAR | Status: DC | PRN
  Administered 2023-09-05: 10 mg via INTRAVENOUS

## 2023-09-05 MED ORDER — PHENYLEPHRINE 80 MCG/ML (10ML) SYRINGE FOR IV PUSH (FOR BLOOD PRESSURE SUPPORT)
PREFILLED_SYRINGE | INTRAVENOUS | Status: AC
  Filled 2023-09-05: qty 10

## 2023-09-05 MED ORDER — FENTANYL CITRATE (PF) 100 MCG/2ML IJ SOLN
INTRAMUSCULAR | Status: DC | PRN
  Administered 2023-09-05 (×2): 50 ug via INTRAVENOUS
  Administered 2023-09-05: 100 ug via INTRAVENOUS

## 2023-09-05 MED ORDER — ALVIMOPAN 12 MG PO CAPS
12.0000 mg | ORAL_CAPSULE | ORAL | Status: AC
  Administered 2023-09-05: 12 mg via ORAL
  Filled 2023-09-05: qty 1

## 2023-09-05 MED ORDER — BISACODYL 5 MG PO TBEC: 20.0000 mg | DELAYED_RELEASE_TABLET | Freq: Once | ORAL | Status: DC

## 2023-09-05 MED ORDER — ACETAMINOPHEN 500 MG PO TABS
1000.0000 mg | ORAL_TABLET | ORAL | Status: AC
  Administered 2023-09-05: 1000 mg via ORAL
  Filled 2023-09-05: qty 2

## 2023-09-05 MED ORDER — HYDROMORPHONE HCL 2 MG/ML IJ SOLN
INTRAMUSCULAR | Status: AC
  Filled 2023-09-05: qty 1

## 2023-09-05 MED ORDER — FENTANYL CITRATE (PF) 100 MCG/2ML IJ SOLN
INTRAMUSCULAR | Status: AC
  Filled 2023-09-05: qty 2

## 2023-09-05 MED ORDER — SUGAMMADEX SODIUM 200 MG/2ML IV SOLN
INTRAVENOUS | Status: DC | PRN
  Administered 2023-09-05: 200 mg via INTRAVENOUS

## 2023-09-05 MED ORDER — OXYCODONE HCL 5 MG PO TABS
5.0000 mg | ORAL_TABLET | Freq: Four times a day (QID) | ORAL | Status: DC | PRN
  Administered 2023-09-06: 5 mg via ORAL
  Administered 2023-09-06 – 2023-09-11 (×8): 10 mg via ORAL
  Filled 2023-09-05 (×4): qty 2
  Filled 2023-09-05: qty 1
  Filled 2023-09-05 (×4): qty 2

## 2023-09-05 MED ORDER — PANTOPRAZOLE SODIUM 40 MG PO TBEC
40.0000 mg | DELAYED_RELEASE_TABLET | Freq: Every day | ORAL | Status: DC
  Administered 2023-09-06 – 2023-09-11 (×6): 40 mg via ORAL
  Filled 2023-09-05 (×6): qty 1

## 2023-09-05 MED ORDER — OXYCODONE HCL 5 MG/5ML PO SOLN: 5.0000 mg | Freq: Once | ORAL | Status: DC | PRN

## 2023-09-05 MED ORDER — CHLORHEXIDINE GLUCONATE 0.12 % MT SOLN: 15.0000 mL | Freq: Once | OROMUCOSAL | Status: DC

## 2023-09-05 MED ORDER — HYDRALAZINE HCL 20 MG/ML IJ SOLN
10.0000 mg | INTRAMUSCULAR | Status: DC | PRN
  Administered 2023-09-10: 10 mg via INTRAVENOUS
  Filled 2023-09-05: qty 1

## 2023-09-05 MED ORDER — HYDROMORPHONE HCL 1 MG/ML IJ SOLN
INTRAMUSCULAR | Status: DC | PRN
Start: 2023-09-05 — End: 2023-09-05
  Administered 2023-09-05 (×3): .5 mg via INTRAVENOUS

## 2023-09-05 MED ORDER — HEPARIN SODIUM (PORCINE) 5000 UNIT/ML IJ SOLN
5000.0000 [IU] | Freq: Three times a day (TID) | INTRAMUSCULAR | Status: DC
  Administered 2023-09-06 – 2023-09-11 (×16): 5000 [IU] via SUBCUTANEOUS
  Filled 2023-09-05 (×16): qty 1

## 2023-09-05 MED ORDER — ONDANSETRON HCL 4 MG PO TABS
4.0000 mg | ORAL_TABLET | Freq: Four times a day (QID) | ORAL | Status: DC | PRN
  Administered 2023-09-10: 4 mg via ORAL
  Filled 2023-09-05: qty 1

## 2023-09-05 MED ORDER — ONDANSETRON HCL 4 MG/2ML IJ SOLN
4.0000 mg | Freq: Four times a day (QID) | INTRAMUSCULAR | Status: DC | PRN
  Administered 2023-09-07: 4 mg via INTRAVENOUS
  Filled 2023-09-05: qty 2

## 2023-09-05 MED ORDER — ROCURONIUM BROMIDE 100 MG/10ML IV SOLN
INTRAVENOUS | Status: DC | PRN
  Administered 2023-09-05 (×2): 20 mg via INTRAVENOUS
  Administered 2023-09-05: 10 mg via INTRAVENOUS
  Administered 2023-09-05: 70 mg via INTRAVENOUS
  Administered 2023-09-05 (×3): 10 mg via INTRAVENOUS

## 2023-09-05 MED ORDER — FENTANYL CITRATE PF 50 MCG/ML IJ SOSY
PREFILLED_SYRINGE | INTRAMUSCULAR | Status: AC
  Filled 2023-09-05: qty 1

## 2023-09-05 MED ORDER — DIPHENHYDRAMINE HCL 12.5 MG/5ML PO ELIX
12.5000 mg | ORAL_SOLUTION | Freq: Four times a day (QID) | ORAL | Status: DC | PRN
  Administered 2023-09-06: 12.5 mg via ORAL
  Filled 2023-09-05: qty 5

## 2023-09-05 MED ORDER — IBUPROFEN 400 MG PO TABS
600.0000 mg | ORAL_TABLET | Freq: Four times a day (QID) | ORAL | Status: DC | PRN
  Filled 2023-09-05: qty 1

## 2023-09-05 MED ORDER — SPIRONOLACTONE 25 MG PO TABS
50.0000 mg | ORAL_TABLET | Freq: Every day | ORAL | Status: DC
  Administered 2023-09-06 – 2023-09-11 (×6): 50 mg via ORAL
  Filled 2023-09-05 (×6): qty 2

## 2023-09-05 MED ORDER — HEPARIN SODIUM (PORCINE) 5000 UNIT/ML IJ SOLN
5000.0000 [IU] | Freq: Once | INTRAMUSCULAR | Status: AC
  Administered 2023-09-05: 5000 [IU] via SUBCUTANEOUS
  Filled 2023-09-05: qty 1

## 2023-09-05 MED ORDER — CHLORHEXIDINE GLUCONATE CLOTH 2 % EX PADS: 6.0000 | MEDICATED_PAD | Freq: Once | CUTANEOUS | Status: DC

## 2023-09-05 MED ORDER — DIPHENHYDRAMINE HCL 50 MG/ML IJ SOLN: 12.5000 mg | Freq: Four times a day (QID) | INTRAMUSCULAR | Status: DC | PRN

## 2023-09-05 MED ORDER — ENSURE PRE-SURGERY PO LIQD
296.0000 mL | Freq: Once | ORAL | Status: DC
  Filled 2023-09-05: qty 296

## 2023-09-05 MED ORDER — BUPIVACAINE-EPINEPHRINE 0.25% -1:200000 IJ SOLN
INTRAMUSCULAR | Status: AC
  Filled 2023-09-05: qty 1

## 2023-09-05 MED ORDER — ORAL CARE MOUTH RINSE: 15.0000 mL | Freq: Once | OROMUCOSAL | Status: DC

## 2023-09-05 MED ORDER — BUPIVACAINE-EPINEPHRINE (PF) 0.25% -1:200000 IJ SOLN
INTRAMUSCULAR | Status: DC | PRN
  Administered 2023-09-05: 50 mL

## 2023-09-05 MED ORDER — LACTATED RINGERS IV SOLN: INTRAVENOUS | Status: DC

## 2023-09-05 MED ORDER — OXYCODONE HCL 5 MG PO TABS: 5.0000 mg | ORAL_TABLET | Freq: Once | ORAL | Status: DC | PRN

## 2023-09-05 MED ORDER — NEOMYCIN SULFATE 500 MG PO TABS
1000.0000 mg | ORAL_TABLET | ORAL | Status: DC
  Filled 2023-09-05: qty 2

## 2023-09-05 MED ORDER — ENSURE SURGERY PO LIQD
237.0000 mL | Freq: Two times a day (BID) | ORAL | Status: DC
  Administered 2023-09-06 – 2023-09-11 (×7): 237 mL via ORAL

## 2023-09-05 MED ORDER — 0.9 % SODIUM CHLORIDE (POUR BTL) OPTIME
TOPICAL | Status: DC | PRN
Start: 2023-09-05 — End: 2023-09-05
  Administered 2023-09-05: 1000 mL

## 2023-09-05 MED ORDER — ENSURE PRE-SURGERY PO LIQD
592.0000 mL | Freq: Once | ORAL | Status: DC
  Filled 2023-09-05: qty 592

## 2023-09-05 MED ORDER — BUPIVACAINE LIPOSOME 1.3 % IJ SUSP
INTRAMUSCULAR | Status: AC
  Filled 2023-09-05: qty 20

## 2023-09-05 MED ORDER — HEPARIN SODIUM (PORCINE) 5000 UNIT/ML IJ SOLN: 5000.0000 [IU] | Freq: Three times a day (TID) | INTRAMUSCULAR | Status: DC

## 2023-09-05 MED ORDER — ONDANSETRON HCL 4 MG/2ML IJ SOLN
INTRAMUSCULAR | Status: AC
  Filled 2023-09-05: qty 2

## 2023-09-05 MED ORDER — POLYETHYLENE GLYCOL 3350 17 GM/SCOOP PO POWD
1.0000 | Freq: Once | ORAL | Status: DC
  Filled 2023-09-05: qty 255

## 2023-09-05 MED ORDER — BUPIVACAINE LIPOSOME 1.3 % IJ SUSP: 20.0000 mL | Freq: Once | INTRAMUSCULAR | Status: DC

## 2023-09-05 MED ORDER — SODIUM CHLORIDE 0.9 % IR SOLN
Status: DC | PRN
Start: 2023-09-05 — End: 2023-09-05
  Administered 2023-09-05: 1000 mL via INTRAVESICAL

## 2023-09-05 MED ORDER — SIMETHICONE 80 MG PO CHEW
40.0000 mg | CHEWABLE_TABLET | Freq: Four times a day (QID) | ORAL | Status: DC | PRN
  Administered 2023-09-06 – 2023-09-10 (×3): 40 mg via ORAL
  Filled 2023-09-05 (×3): qty 1

## 2023-09-05 MED ORDER — LACTATED RINGERS IV SOLN
INTRAVENOUS | Status: DC | PRN
Start: 2023-09-05 — End: 2023-09-05

## 2023-09-05 MED ORDER — ACETAMINOPHEN 500 MG PO TABS
ORAL_TABLET | ORAL | Status: AC
  Filled 2023-09-05: qty 1

## 2023-09-05 MED ORDER — METRONIDAZOLE 500 MG PO TABS
1000.0000 mg | ORAL_TABLET | ORAL | Status: DC
  Filled 2023-09-05: qty 2

## 2023-09-05 MED ORDER — LIDOCAINE HCL (PF) 2 % IJ SOLN
INTRAMUSCULAR | Status: AC
  Filled 2023-09-05: qty 5

## 2023-09-05 MED ORDER — ACETAMINOPHEN 500 MG PO TABS
1000.0000 mg | ORAL_TABLET | Freq: Four times a day (QID) | ORAL | Status: DC
  Administered 2023-09-05 – 2023-09-11 (×18): 1000 mg via ORAL
  Filled 2023-09-05 (×20): qty 2

## 2023-09-05 MED ORDER — ROCURONIUM BROMIDE 10 MG/ML (PF) SYRINGE
PREFILLED_SYRINGE | INTRAVENOUS | Status: AC
  Filled 2023-09-05: qty 10

## 2023-09-05 MED ORDER — MIDAZOLAM HCL 5 MG/5ML IJ SOLN
INTRAMUSCULAR | Status: DC | PRN
  Administered 2023-09-05: 2 mg via INTRAVENOUS

## 2023-09-05 MED ORDER — LIDOCAINE HCL (CARDIAC) PF 100 MG/5ML IV SOSY
PREFILLED_SYRINGE | INTRAVENOUS | Status: DC | PRN
  Administered 2023-09-05: 60 mg via INTRAVENOUS

## 2023-09-05 MED ORDER — STERILE WATER FOR INJECTION IJ SOLN
INTRAMUSCULAR | Status: AC
  Filled 2023-09-05: qty 10

## 2023-09-05 MED ORDER — ONDANSETRON HCL 4 MG/2ML IJ SOLN
INTRAMUSCULAR | Status: DC | PRN
  Administered 2023-09-05: 4 mg via INTRAVENOUS

## 2023-09-05 MED ORDER — FENTANYL CITRATE PF 50 MCG/ML IJ SOSY
25.0000 ug | PREFILLED_SYRINGE | INTRAMUSCULAR | Status: DC | PRN
  Administered 2023-09-05 (×2): 50 ug via INTRAVENOUS

## 2023-09-05 MED ORDER — PROPOFOL 10 MG/ML IV BOLUS
INTRAVENOUS | Status: AC
  Filled 2023-09-05: qty 20

## 2023-09-05 MED ORDER — PROPOFOL 10 MG/ML IV BOLUS
INTRAVENOUS | Status: DC | PRN
  Administered 2023-09-05: 150 mg via INTRAVENOUS
  Administered 2023-09-05: 50 mg via INTRAVENOUS

## 2023-09-05 MED ORDER — HYDROMORPHONE HCL 1 MG/ML IJ SOLN
0.5000 mg | INTRAMUSCULAR | Status: DC | PRN
  Administered 2023-09-05 – 2023-09-06 (×4): 0.5 mg via INTRAVENOUS
  Filled 2023-09-05 (×5): qty 0.5

## 2023-09-05 SURGICAL SUPPLY — 117 items
APL PRP STRL LF DISP 70% ISPRP (MISCELLANEOUS) ×1
APPLIER CLIP 5 13 M/L LIGAMAX5 (MISCELLANEOUS)
APPLIER CLIP ROT 10 11.4 M/L (STAPLE)
APR CLP MED LRG 11.4X10 (STAPLE)
APR CLP MED LRG 5 ANG JAW (MISCELLANEOUS)
BAG COUNTER SPONGE SURGICOUNT (BAG) IMPLANT
BAG SPNG CNTER NS LX DISP (BAG)
BAG URO CATCHER STRL LF (MISCELLANEOUS) ×1 IMPLANT
BLADE EXTENDED COATED 6.5IN (ELECTRODE) ×1 IMPLANT
CANNULA REDUCER 12-8 DVNC XI (CANNULA) ×1 IMPLANT
CATH URETL OPEN 5X70 (CATHETERS) IMPLANT
CATH URETL OPEN END 6FR 70 (CATHETERS) IMPLANT
CHLORAPREP W/TINT 26 (MISCELLANEOUS) ×1 IMPLANT
CLIP APPLIE 5 13 M/L LIGAMAX5 (MISCELLANEOUS) IMPLANT
CLIP APPLIE ROT 10 11.4 M/L (STAPLE) IMPLANT
CLIP LIGATING HEM O LOK PURPLE (MISCELLANEOUS) IMPLANT
CLIP LIGATING HEMO O LOK GREEN (MISCELLANEOUS) IMPLANT
CLOTH BEACON ORANGE TIMEOUT ST (SAFETY) ×1 IMPLANT
COVER SURGICAL LIGHT HANDLE (MISCELLANEOUS) ×2 IMPLANT
COVER TIP SHEARS 8 DVNC (MISCELLANEOUS) ×1 IMPLANT
DEFOGGER SCOPE WARMER CLEARIFY (MISCELLANEOUS) ×1 IMPLANT
DEVICE TROCAR PUNCTURE CLOSURE (ENDOMECHANICALS) IMPLANT
DRAIN CHANNEL 19F RND (DRAIN) ×1 IMPLANT
DRAPE ARM DVNC X/XI (DISPOSABLE) ×4 IMPLANT
DRAPE COLUMN DVNC XI (DISPOSABLE) ×1 IMPLANT
DRAPE SURG IRRIG POUCH 19X23 (DRAPES) ×1 IMPLANT
DRIVER NDL LRG 8 DVNC XI (INSTRUMENTS) ×1 IMPLANT
DRIVER NDLE LRG 8 DVNC XI (INSTRUMENTS) ×1
DRSG OPSITE POSTOP 4X10 (GAUZE/BANDAGES/DRESSINGS) IMPLANT
DRSG OPSITE POSTOP 4X6 (GAUZE/BANDAGES/DRESSINGS) IMPLANT
DRSG OPSITE POSTOP 4X8 (GAUZE/BANDAGES/DRESSINGS) IMPLANT
DRSG TEGADERM 2-3/8X2-3/4 SM (GAUZE/BANDAGES/DRESSINGS) ×5 IMPLANT
DRSG TEGADERM 4X4.75 (GAUZE/BANDAGES/DRESSINGS) ×1 IMPLANT
ELECT REM PT RETURN 15FT ADLT (MISCELLANEOUS) ×1 IMPLANT
ENDOLOOP SUT PDS II 0 18 (SUTURE) IMPLANT
EVACUATOR SILICONE 100CC (DRAIN) ×1 IMPLANT
GAUZE SPONGE 2X2 8PLY STRL LF (GAUZE/BANDAGES/DRESSINGS) ×1 IMPLANT
GAUZE SPONGE 4X4 12PLY STRL (GAUZE/BANDAGES/DRESSINGS) IMPLANT
GLOVE BIO SURGEON STRL SZ7.5 (GLOVE) ×3 IMPLANT
GLOVE BIOGEL M 7.0 STRL (GLOVE) ×1 IMPLANT
GLOVE INDICATOR 8.0 STRL GRN (GLOVE) ×3 IMPLANT
GOWN SRG XL LVL 4 BRTHBL STRL (GOWNS) ×1 IMPLANT
GOWN STRL NON-REIN XL LVL4 (GOWNS) ×1
GOWN STRL REUS W/ TWL XL LVL3 (GOWN DISPOSABLE) ×6 IMPLANT
GOWN STRL REUS W/TWL XL LVL3 (GOWN DISPOSABLE) ×6
GRASPER SUT TROCAR 14GX15 (MISCELLANEOUS) IMPLANT
GRASPER TIP-UP FEN DVNC XI (INSTRUMENTS) ×1 IMPLANT
GUIDEWIRE STR DUAL SENSOR (WIRE) IMPLANT
GUIDEWIRE ZIPWRE .038 STRAIGHT (WIRE) IMPLANT
HOLDER FOLEY CATH W/STRAP (MISCELLANEOUS) ×1 IMPLANT
IRRIG SUCT STRYKERFLOW 2 WTIP (MISCELLANEOUS) ×1
IRRIGATION SUCT STRKRFLW 2 WTP (MISCELLANEOUS) ×1 IMPLANT
KIT PROCEDURE DVNC SI (MISCELLANEOUS) ×1 IMPLANT
KIT TURNOVER KIT A (KITS) IMPLANT
MANIFOLD NEPTUNE II (INSTRUMENTS) ×1 IMPLANT
NDL INSUFFLATION 14GA 120MM (NEEDLE) ×1 IMPLANT
NEEDLE INSUFFLATION 14GA 120MM (NEEDLE) ×1
PACK CARDIOVASCULAR III (CUSTOM PROCEDURE TRAY) ×1 IMPLANT
PACK COLON (CUSTOM PROCEDURE TRAY) ×1 IMPLANT
PACK CYSTO (CUSTOM PROCEDURE TRAY) ×1 IMPLANT
PAD POSITIONING PINK XL (MISCELLANEOUS) ×1 IMPLANT
PENCIL SMOKE EVACUATOR (MISCELLANEOUS) IMPLANT
PROTECTOR NERVE ULNAR (MISCELLANEOUS) ×2 IMPLANT
RELOAD STAPLE 45 3.5 BLU DVNC (STAPLE) IMPLANT
RELOAD STAPLE 45 4.3 GRN DVNC (STAPLE) IMPLANT
RELOAD STAPLE 60 3.5 BLU DVNC (STAPLE) IMPLANT
RELOAD STAPLE 60 4.3 GRN DVNC (STAPLE) IMPLANT
RETRACTOR WND ALEXIS 18 MED (MISCELLANEOUS) IMPLANT
RTRCTR WOUND ALEXIS 18CM MED (MISCELLANEOUS)
SCISSORS LAP 5X35 DISP (ENDOMECHANICALS) IMPLANT
SCISSORS MNPLR CVD DVNC XI (INSTRUMENTS) ×1 IMPLANT
SEAL UNIV 5-12 XI (MISCELLANEOUS) ×4 IMPLANT
SEALER VESSEL EXT DVNC XI (MISCELLANEOUS) ×1 IMPLANT
SLEEVE ADV FIXATION 5X100MM (TROCAR) IMPLANT
SOL ELECTROSURG ANTI STICK (MISCELLANEOUS) ×1
SOLUTION ELECTROSURG ANTI STCK (MISCELLANEOUS) ×1 IMPLANT
SPIKE FLUID TRANSFER (MISCELLANEOUS) ×1 IMPLANT
STAPLER 60 SUREFORM DVNC (STAPLE) IMPLANT
STAPLER ECHELON POWER CIR 29 (STAPLE) IMPLANT
STAPLER ECHELON POWER CIR 31 (STAPLE) IMPLANT
STAPLER RELOAD 3.5X45 BLU DVNC (STAPLE)
STAPLER RELOAD 3.5X60 BLU DVNC (STAPLE)
STAPLER RELOAD 4.3X45 GRN DVNC (STAPLE)
STAPLER RELOAD 4.3X60 GRN DVNC (STAPLE)
STOPCOCK 4 WAY LG BORE MALE ST (IV SETS) ×2 IMPLANT
SURGILUBE 2OZ TUBE FLIPTOP (MISCELLANEOUS) ×1 IMPLANT
SUT MNCRL AB 4-0 PS2 18 (SUTURE) ×1 IMPLANT
SUT PDS AB 1 CT1 27 (SUTURE) IMPLANT
SUT PDS AB 1 TP1 96 (SUTURE) IMPLANT
SUT PROLENE 0 CT 2 (SUTURE) IMPLANT
SUT PROLENE 2 0 KS (SUTURE) ×1 IMPLANT
SUT PROLENE 2 0 SH DA (SUTURE) IMPLANT
SUT SILK 2 0 (SUTURE)
SUT SILK 2 0 SH CR/8 (SUTURE) IMPLANT
SUT SILK 2-0 18XBRD TIE 12 (SUTURE) IMPLANT
SUT SILK 3 0 (SUTURE) ×1
SUT SILK 3 0 SH 30 (SUTURE) IMPLANT
SUT SILK 3 0 SH CR/8 (SUTURE) ×1 IMPLANT
SUT SILK 3-0 18XBRD TIE 12 (SUTURE) ×1 IMPLANT
SUT V-LOC BARB 180 2/0GR6 GS22 (SUTURE)
SUT VIC AB 3-0 SH 18 (SUTURE) IMPLANT
SUT VIC AB 3-0 SH 27 (SUTURE)
SUT VIC AB 3-0 SH 27XBRD (SUTURE) IMPLANT
SUT VICRYL 0 UR6 27IN ABS (SUTURE) ×1 IMPLANT
SUTURE V-LC BRB 180 2/0GR6GS22 (SUTURE) IMPLANT
SYR 10ML LL (SYRINGE) ×1 IMPLANT
SYS LAPSCP GELPORT 120MM (MISCELLANEOUS)
SYS WOUND ALEXIS 18CM MED (MISCELLANEOUS) ×1
SYSTEM LAPSCP GELPORT 120MM (MISCELLANEOUS) IMPLANT
SYSTEM WOUND ALEXIS 18CM MED (MISCELLANEOUS) ×1 IMPLANT
TAPE UMBILICAL 1/8 X36 TWILL (MISCELLANEOUS) ×1 IMPLANT
TOWEL OR NON WOVEN STRL DISP B (DISPOSABLE) ×1 IMPLANT
TRAY FOLEY MTR SLVR 16FR STAT (SET/KITS/TRAYS/PACK) ×1 IMPLANT
TROCAR ADV FIXATION 5X100MM (TROCAR) ×1 IMPLANT
TUBING CONNECTING 10 (TUBING) ×4 IMPLANT
TUBING INSUFFLATION 10FT LAP (TUBING) ×1 IMPLANT
TUBING UROLOGY SET (TUBING) IMPLANT

## 2023-09-05 NOTE — Op Note (Addendum)
PATIENT: Kristin Gibson  67 y.o. female  Patient Care Team: Woodroe Chen, MD as PCP - General (Internal Medicine) Almond Lint, MD as Consulting Physician (General Surgery)  PREOP DIAGNOSIS: COLOSTOMY STATUS  POSTOP DIAGNOSIS: COLOSTOMY STATUS  PROCEDURE:  Robotic assisted takedown of end colostomy Repair of enterotomy, full thickness, x1 Repair of small bowel, partial thickness x3 Robotic lysis of adhesions x 150 minutes Flexible sigmoidoscopy Bilateral transversus abdominus plane (TAP) blocks  SURGEON: Stephanie Coup. Sherrice Creekmore, MD  ASSISTANT: Romie Levee, MD  ANESTHESIA: General endotracheal  EBL: 50 mL Total I/O In: 1700 [I.V.:1600; IV Piggyback:100] Out: 325 [Urine:325]  DRAINS: None  SPECIMEN: Colostomy  COUNTS: Sponge, needle and instrument counts were reported correct x2  FINDINGS: Significant burden of adhesions from prior surgery which required meticulous and tedious lysis of adhesions.  1 enterotomy and 3 deserosalizations of the small bowel occurred which were inherent to the nature of the procedure.  A well perfused, tension free, hemostatic, air tight 29 mm EEA colorectal anastomosis fashioned 15 cm from the anal verge by flexible sigmoidoscopy.   NARRATIVE: Informed consent was verified. The patient was taken to the operating room, placed supine on the operating table and SCD's were applied. General endotracheal anesthesia was induced without difficulty. She was then positioned in the lithotomy position with Allen stirrups.  Pressure points were evaluated and padded.  A foley catheter was then placed by nursing under sterile conditions. Hair on the abdomen was clipped.  She was secured to the operating table. Dr. Cardell Peach with [facility] came in for his portion of the procedure with instillation of ureteral indocyanine green.  Please refer to his notes for details.. The abdomen was then prepped and draped in the standard sterile fashion. Surgical timeout  was called indicating the correct patient, procedure, positioning and need for preoperative antibiotics.   An OG tube was placed by anesthesia and confirmed to be to suction.  At Palmer's point, a stab incision was created and the Veress needle was introduced into the peritoneal cavity on the first attempt.  Intraperitoneal location was confirmed by the aspiration and saline drop test.  Pneumoperitoneum was established to a maximum pressure of 15 mmHg using CO2.  Following this, the abdomen was marked for planned trocar sites.  Just to the right and cephalad to the umbilicus, an 8 mm incision was created and an 8 mm blunt tipped robotic trocar was cautiously placed into the peritoneal cavity.  The laparoscope was inserted and demonstrated no evidence of trocar site nor Veress needle site complications.  The Veress needle was removed.  Bilateral transversus abdominis plane blocks were then created using a dilute mixture of Exparel with Marcaine.  3 additional 8 mm robotic trochars were placed under direct visualization roughly in a line extending from the right ASIS towards the left upper quadrant. The bladder was inspected and noted to be at/below the pubic symphysis.  Staying 3 fingerbreadths above the pubic symphysis, an incision was created and the 12 mm robotic trocar inserted directed cephalad into the peritoneal cavity under direct visualization.  An additional 5 mm assist port was placed in the right lateral abdomen under direct visualization.  The abdomen was surveyed and there was adhesions noted across much of her midline consisting of small bowel and a small amount of omentum.  She was positioned in Trendelenburg with the left side tilted slightly up.  Small bowel was carefully retracted out of the pelvis.  The robot was then docked and I went to  the console.  We began with a biotic adhesiolysis. Adhesions consisting of small bowel were carefully taken down from the abdominal wall.  The adhesions are  somewhat filmy but rather dense including numerous loops of small bowel.  There are also small bowel to small bowel adhesions which were lysed sharply.  There were also small bowel to descending colon adhesions which were lysed sharply.  There were also adhesions of small bowel throughout her pelvis which were also lysed sharply.  There were adhesions to the rectal stump which was also freed sharply.  The rectal stump is healthy in appearance.  The staple line is noted intact with Prolene sutures on the corners.  During adhesiolysis, there were 3 separate deserosalization's and one 5 x 5 mm full-thickness enterotomy.  The deserosalization's were repaired with 3-0 silk Lembert sutures robotically.  The full-thickness enterotomy was repaired towards the end of the procedure through a small low midline incision.   The rectosigmoid stump is able to be identified.  The left and right ureters are able to be identified particularly with the aid of ICG within their lumens.  The ureters are in their typical course lateral to her Hartman's stump.  These were protected free of injury during our dissections.  This is circumferentially dissected using a TME plane.  The space between the fascia propria the rectum and the presacral fascia is identified and delineated.  The stump is mobilized and free.  The mesorectum is intact.  The stump is essentially at the level of the proximalmost rectum.  All adhesions have been separated from the colostomy in the abdominal wall.  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  The descending colon had been mobilized all the way up to the level of the splenic flexure and there was more than adequate reach to the deep pelvis without any further mobilization.  I then went below to do a rectal exam.  Old mucus containing stool balls were evacuated from the rectal vault.  EEA sizers to 29 mm in size were introduced up  to the level of the proximal rectal stump without difficulty.  I then scrubbed back in.  We began with a low midline incision to deal with the full-thickness enterotomy and ensure we had an appropriate repair.  A small infraumbilical incision was created in the skin.  Subcutaneous tissue divided which cautery.  The fascia is incised the midline.  The small bowel was then eviscerated and additional adhesions were carefully lysed sharply.  We are then able to run the small bowel from the proximal jejunum all the way down to the distal ileum.  We ran it 3 times in total.  The full-thickness enterotomy that we knew about which was 5 x 5 mm in size is on the antimesenteric border.  It is repaired in 2 layers using 3-0 Vicryl sutures followed by 4-0 silk sutures in a Lembert manner.  The closure is inspected noted to be complete.  The lumen is able to be palpated and widely patent.  The other serosal repairs are inspected and noted to be satisfactory.  There are no other injuries to the small bowel found on the third run-through.  These injuries were inherent to the nature of the procedure given the density of adhesions present within her abdomen.  Attention was then directed at the colostomy. The colostomy was circumferentially incised.  The subcutaneous fat was divided.  The colostomy was able  to be circumferentially dissected free of the surrounding fascia.  The colostomy was then inspected and there is no evidence of injury.  Just proximal to the mucocutaneous junction, the mesentery was cleared and ligated, tied with 2-0 silk ties.  A pursestring device was then placed.  A 2-0 Prolene on a Keith needle was passed.  The colostomy was excised and passed off the specimen.  3-0 silk sutures were used to create belt loops around the pursestring.  EEA sizers were passed and a 29 mm EEA selected.  The anvil was placed in the pursestring tied.  A small amount of fat was cleared from the planned staple line.  Quality of  the tissues is good.  The colon is inspected and found to have no injuries.  There is a palpable pulse in the marginal artery going all the way out to our pursestring.  This is placed back into the abdomen.  The colostomy site was then closed using 2 running #1 PDS sutures on the rectus fascia.  The fascial closure was palpated and noted to be complete.   An Alexis wound protector was placed. A cap is placed and pneumoperitoneum reestablished.  The anvil reaches into the deep pelvis without any tension and remains in that location.  I then went below to pass the stapler.  Under direct visualization, EEA sizers were again serially passed.  The 29 mm EEA stapler was passed.  The spike is deployed just anterior to the staple line.  The components were then mated.  Orientation is confirmed such that there is no twisting of the colon or small bowel underneath the mesenteric defect.  The stapler was then closed, held, and fired.  Colon proximally anastomosis is gently occluded.  The pelvis was filled with irrigation.  I passed the flexible sigmoidoscope to perform a leak test.  The anastomosis is hemostatic in appearance and airtight.  All tissues are pink in color. This is located at 15 cm from the anal verge by flexible sigmoidoscopy. Additionally, looking from above, there is no tension on the colon or mesentery.  Sigmoidoscope was withdrawn. I scrubbed back in. Irrigation was evacuated from the pelvis.  The abdomen and pelvis are surveyed and noted to be completely hemostatic without any apparent injury.  Under direct visualization, all trochars are removed.  The Alexis wound protector was removed.    The rectus fascia at the former colostomy site was then closed using 2 running #1 PDS sutures.  This is able to be done without any significant tension.  The fascia was then palpated and noted to be completely closed.  Additional anesthetic was infiltrated at this site.   Sponge, needle, and instrument counts  were reported correct x2. 4-0 Monocryl subcuticular suture was used to close the skin of all incision sites.  Dermabond was placed over all incisions.  A 0 Vicryl pursestring suture was used to cinch the skin down at the former colostomy site.  The wound is then worked with a moist 4 x 4 gauze.  Additional gauze was placed and secured with tape.  She was then taken out of lithotomy, awakened from anesthesia, extubated, and transferred to a stretcher for transport to PACU in satisfactory condition having tolerated the procedure well.

## 2023-09-05 NOTE — Progress Notes (Signed)
   09/05/23 2341  BiPAP/CPAP/SIPAP  BiPAP/CPAP/SIPAP Pt Type Adult  Reason BIPAP/CPAP not in use NG tube in place   PT unable to use cpap do to NG tube, but would like to try tomorrow night.

## 2023-09-05 NOTE — Op Note (Signed)
Operative Note  Preoperative diagnosis:  1.  Diverticulitis  Postoperative diagnosis: 1.  Diverticulitis  Procedure(s): 1.  Cystoscopy 2. Firefly instillation in bilateral ureters  Surgeon: Jettie Pagan, MD  Assistants:  None  Anesthesia:  General  Complications:  None  EBL:  None for my portion of the case  Specimens: 1. None for my portion of the case  Drains/Catheters: 1.  16 French foley catheter  Intraoperative findings:   Bladder without suspicious bladder lesions.  Indication:  Kristin Gibson is a female with a history of diverticulitis and colostomy here for colostomy takedown with cystoscopy and firefly placement.  Description of procedure: The patient was identified and surgical site verification was performed prior to obtaining consent.  The patient was brought to the operating suite.  Under adequate general anesthesia the patient was positioned in dorsal lithotomy and prepped and draped.  A preoperative Time Out was performed addressing the anticipated surgical site, procedure, and safety precautions.  The 21Fr cystoscope was inserted into the patient's urethral meatus and advanced to the bladder.   The bladder was inspected with the 30 degree lens.  The ureteral orifices were in their normal anatomic positions.  The bladder showed no trabeculation.  There were no bladder tumors noted.  The right orifice was intubated with a sensor wire and a 5 [place] open ended catheter was placed over the wire into the right ureter and advanced to 25 cm. I then slowly pulled back and injected 7.6ml of the firefly contrast.  Subsequently turned my attention to the patient's left ureteral orifice and performed a similar task, injecting 7.6ml of the firefly solution. in a retrograde fashion in the left ureter.   I then  placed a 16 [place] Foley.  The case was then turned over to Dr. Lucilla Lame team for the remainder of the procedure.  Matt R. Alabama Doig MD [facility]  Pager:  [phone]

## 2023-09-05 NOTE — Anesthesia Procedure Notes (Signed)
Procedure Name: Intubation Date/Time: 09/05/2023 1:08 PM  Performed by: Laurita Quint, CRNAPre-anesthesia Checklist: Patient identified, Emergency Drugs available, Suction available and Patient being monitored Patient Re-evaluated:Patient Re-evaluated prior to induction Oxygen Delivery Method: Circle System Utilized Preoxygenation: Pre-oxygenation with 100% oxygen Induction Type: IV induction Ventilation: Mask ventilation without difficulty Laryngoscope Size: Miller and 2 Grade View: Grade I Tube type: Oral Tube size: 7.0 mm Number of attempts: 1 Airway Equipment and Method: Stylet and Oral airway Placement Confirmation: ETT inserted through vocal cords under direct vision, positive ETCO2 and breath sounds checked- equal and bilateral Secured at: 23 cm Tube secured with: Tape Dental Injury: Teeth and Oropharynx as per pre-operative assessment

## 2023-09-05 NOTE — Consult Note (Signed)
Urology Consult   Physician requesting consult: Dr. Cliffton Asters  Reason for consult: Need for cysto/firefly  History of Present Illness: Kristin Gibson is a 67 y.o. with a history of diverticulitis with colostomy here for colostomy reversal. Urology has been asked for firefly instillation. Denies fevers, chills, dysuria.  She denies a history of voiding or storage urinary symptoms, hematuria, UTIs, STDs, urolithiasis, GU malignancy/trauma/surgery.  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   Hypertension    Knee pain, chronic    Lower back pain    PONV (postoperative nausea and vomiting)    Pre-diabetes    Sleep apnea    cpap    Past Surgical History:  Procedure Laterality Date   ABDOMINAL HYSTERECTOMY     BREAST REDUCTION SURGERY     IR CATHETER TUBE CHANGE  09/12/2022   IR SINUS/FIST TUBE CHK-NON GI  09/18/2022   KNEE SURGERY     LAPAROSCOPIC GASTRIC SLEEVE RESECTION N/A 02/19/2022   Procedure: LAPAROSCOPIC GASTRIC SLEEVE RESECTION, HIATAL HERNIA REPAIR;  Surgeon: Berna Bue, MD;  Location: [facility];  Service: General;  Laterality: N/A;   PARTIAL COLECTOMY N/A 08/31/2022   Procedure: OPEN PARTIAL COLECTOMY WITH COLOSTOMY;  Surgeon: Darnell Level, MD;  Location: [facility];  Service: General;  Laterality: N/A;   right knee replacement      TRIGGER FINGER RELEASE Right 04/2023   thumb, [facility] Dr. Lelon Perla   TUBAL LIGATION     UPPER GI ENDOSCOPY N/A 02/19/2022   Procedure: UPPER GI ENDOSCOPY;  Surgeon: Berna Bue, MD;  Location: [facility];  Service: General;  Laterality: N/A;     Current Hospital Medications:  Home meds:  No current facility-administered medications on file prior to encounter.   Current Outpatient Medications on File Prior to Encounter  Medication Sig Dispense Refill   acetaminophen (TYLENOL) 500 MG tablet Take 1,000 mg by  mouth daily as needed for mild pain. pain      apixaban (ELIQUIS) 5 MG TABS tablet Take 1 tablet (5 mg total) by mouth 2 (two) times daily. 60 tablet 0   diclofenac Sodium (VOLTAREN) 1 % GEL Apply 2 g topically daily as needed (pain).     dicyclomine (BENTYL) 20 MG tablet Take 20 mg by mouth daily as needed for spasms.     Multiple Vitamins-Minerals (BARIATRIC FUSION) CHEW Chew 1 tablet by mouth daily.     NON FORMULARY Pt uses a cpap nightly     ondansetron (ZOFRAN) 4 MG tablet Take 1 tablet (4 mg total) by mouth every 6 (six) hours. 12 tablet 0   pantoprazole (PROTONIX) 40 MG tablet Take 1 tablet (40 mg total) by mouth daily. 90 tablet 0   spironolactone (ALDACTONE) 50 MG tablet Take 50 mg by mouth daily.     fluconazole (DIFLUCAN) 150 MG tablet Take 1 tablet (150 mg total) by mouth daily as needed (yeast infection). (Patient not taking: Reported on 08/19/2023) 3 tablet 0   methocarbamol (ROBAXIN) 500 MG tablet Take 2 tablets (1,000 mg total) by mouth every 8 (eight) hours as needed for muscle spasms. (Patient not taking: Reported on 08/19/2023) 60 tablet 1   nystatin (MYCOSTATIN) 100000 UNIT/ML suspension Take 5 mLs (500,000 Units total) by mouth 4 (four) times daily as needed (mouth pain). (Patient not taking: Reported on 07/04/2023) 60 mL 0   ondansetron (  ZOFRAN-ODT) 4 MG disintegrating tablet Take 1 tablet (4 mg total) by mouth every 6 (six) hours as needed for nausea or vomiting. (Patient not taking: Reported on 08/19/2023) 20 tablet 0   oxyCODONE (ROXICODONE) 5 MG immediate release tablet Take 0.5-1 tablets (2.5-5 mg total) by mouth every 6 (six) hours as needed for severe pain. (Patient not taking: Reported on 08/19/2023) 12 tablet 0   oxyCODONE 10 MG TABS Take 0.5-1 tablets (5-10 mg total) by mouth every 6 (six) hours as needed for severe pain or moderate pain. (Patient not taking: Reported on 08/19/2023) 25 tablet 0   Probiotic CAPS Take 1 capsule by mouth daily. (Patient not taking: Reported on  08/19/2023) 30 capsule 0     Scheduled Meds:  acetaminophen       bisacodyl  20 mg Oral Once   bupivacaine liposome  20 mL Infiltration Once   chlorhexidine  15 mL Mouth/Throat Once   Or   mouth rinse  15 mL Mouth Rinse Once   Chlorhexidine Gluconate Cloth  6 each Topical Once   And   Chlorhexidine Gluconate Cloth  6 each Topical Once   feeding supplement  296 mL Oral Once   feeding supplement  592 mL Oral Once   neomycin  1,000 mg Oral 3 times per day   And   metroNIDAZOLE  1,000 mg Oral 3 times per day   polyethylene glycol powder  1 Container Oral Once   Continuous Infusions:  cefoTEtan (CEFOTAN) IV     lactated ringers 10 mL/hr at 09/05/23 1045   PRN Meds:.acetaminophen  Allergies:  Allergies  Allergen Reactions   Benazepril Swelling   Other Nausea And Vomiting    Post anesthesia   Sulfa Antibiotics Itching and Nausea Only   Elemental Sulfur Itching and Nausea Only    Family History  Problem Relation Age of Onset   Hypertension Mother    Cancer Sister    Diabetes Sister    Liver disease Neg Hx    Esophageal cancer Neg Hx    Colon cancer Neg Hx    Rectal cancer Neg Hx    Stomach cancer Neg Hx     Social History:  reports that she has quit smoking. She has never used smokeless tobacco. She reports current alcohol use. She reports that she does not use drugs.  ROS: A complete review of systems was performed.  All systems are negative except for pertinent findings as noted.  Physical Exam:  Vital signs in last 24 hours: Temp:  [98.9 F (37.2 C)] 98.9 F (37.2 C) (09/12 1015) Pulse Rate:  [49] 49 (09/12 1015) BP: (169)/(95) 169/95 (09/12 1015) SpO2:  [99 %] 99 % (09/12 1015) Weight:  [113.4 kg] 113.4 kg (09/12 1101) Constitutional:  Alert and oriented, No acute distress Cardiovascular: Regular rate and rhythm Respiratory: Normal respiratory effort, Lungs clear bilaterally GI: Abdomen is soft, nontender, nondistended, no abdominal masses GU: No CVA  tenderness Neurologic: Grossly intact, no focal deficits Psychiatric: Normal mood and affect  Laboratory Data:  No results for input(s): "WBC", "HGB", "HCT", "PLT" in the last 72 hours.  No results for input(s): "NA", "K", "CL", "GLUCOSE", "BUN", "CALCIUM", "CREATININE" in the last 72 hours.  Invalid input(s): "CO3"   Results for orders placed or performed during the hospital encounter of 09/05/23 (from the past 24 hour(s))  Type and screen [facility]     Status: None   Collection Time: 09/05/23 10:43 AM  Result Value Ref Range  ABO/RH(D) O POS    Antibody Screen NEG    Sample Expiration      09/08/2023,2359 Performed at [facility], [address] [place], [place] [address]    No results found for this or any previous visit (from the past 240 hour(s)).  Renal Function: No results for input(s): "CREATININE" in the last 168 hours. Estimated Creatinine Clearance: 86.3 mL/min (by C-G formula based on SCr of 0.85 mg/dL).  Radiologic Imaging: No results found.  I independently reviewed the above imaging studies.  Impression/Recommendation: Diverticulitis here for colostomy reversal with cystoscopy and firefly instillation  -The risks, benefits and alternatives of cystoscopy with firefly instillation was discussed with the patient.  Risks include, but are not limited to: bleeding, urinary tract infection, ureteral injury, ureteral stricture disease, chronic pain, urinary symptoms, bladder injury, MI, CVA, DVT, PE and the inherent risks with general anesthesia.  The patient voices understanding and wishes to proceed.   Matt R. Xiomar Crompton MD 09/05/2023, 12:12 PM  [facility]  Pager: [phone]

## 2023-09-05 NOTE — Anesthesia Postprocedure Evaluation (Signed)
Anesthesia Post Note  Patient: Urvi Blocker Kenagy  Procedure(s) Performed: XI ROBOTIC ASSISTED COLOSTOMY REVERAL; LYSIS OF ADHESIONS, BILATERAL TAP BLOCK, REPAIR OF SMALL BOWEL X 4 FLEXIBLE SIGMOIDOSCOPY CYSTOSCOPY with FIREFLY INJECTION     Patient location during evaluation: PACU Anesthesia Type: General Level of consciousness: awake Pain management: pain level controlled Vital Signs Assessment: post-procedure vital signs reviewed and stable Respiratory status: spontaneous breathing, nonlabored ventilation and respiratory function stable Cardiovascular status: blood pressure returned to baseline and stable Postop Assessment: no apparent nausea or vomiting Anesthetic complications: no   No notable events documented.  Last Vitals:  Vitals:   09/05/23 1830 09/05/23 1845  BP: (!) 154/99 (!) 155/104  Pulse: 89 80  Resp: 15 12  Temp:    SpO2: 95% 95%    Last Pain:  Vitals:   09/05/23 1845  TempSrc:   PainSc: 6                  Linton Rump

## 2023-09-05 NOTE — H&P (Signed)
CC: Here today for surgery  HPI: Kristin Gibson is an 67 y.o. female with history of afib (on eliquis), HTN, GERD, whom is seen in the office today as a referral by Dr. Darnell Level for possible consideration of colostomy reversal.  OR 08/31/22 with Dr. Gerrit Friends - Exploratory laparotomy, sigmoid colectomy, descending colostomy, VAC dressing placement   PATH: - Segment of colon (23 cm) showing diverticulosis, diverticulitis and  associated microabscesses  - Margins appear viable   Colonoscopy 2018 - [facility] [place], [place]) -we do not have a copy of this.  She has done well since being discharged with her colostomy. She denies any complaints at present. She has a good appetite. No nausea or vomiting. Her colostomy has been working well. Occasional constipation that she will treat with daily to twice daily MiraLAX. Nothing per rectum including any sort of mucus or stool. No abdominal pain.  [facility] 02/25/23 Unsuccessful attempted barium enema due to inability to advance either a soft Foley catheter or regular rectal tube into the rectum. Correlation with rectal exam is recommended, and CT could be performed for further evaluation if clinically warranted   Colonoscopy Dr. Barron Alvine 07/04/23 -  - The rectum, recto-sigmoid colon and distal sigmoid colon are normal. - Diverticulosis in the descending colon and in the ascending colon. - One 3 mm polyp in the descending colon, removed with a cold snare. Resected and retrieved.   PATH - hyperplastic polyp  INTERVAL HX She has been doing well. No complaints at present aside from inconveniences with having a colostomy. Her colostomy is been working reasonably well. Occasional pouching difficulties but getting along okay all things considered; has small peristomal bulge. Tolerated bowel prep with satisfactory result. Off Eliquis perioperatively.   PMH: afib (on eliquis), HTN, GERD  PSH: Sleeve gastrectomy, HH repair  (DR. Fredricka Bonine 02/19/22)  Social Hx: Denies use of tobacco/EtOH/illicit drug. 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   Hypertension    Knee pain, chronic    Lower back pain    PONV (postoperative nausea and vomiting)    Pre-diabetes    Sleep apnea    cpap    Past Surgical History:  Procedure Laterality Date   ABDOMINAL HYSTERECTOMY     BREAST REDUCTION SURGERY     IR CATHETER TUBE CHANGE  09/12/2022   IR SINUS/FIST TUBE CHK-NON GI  09/18/2022   KNEE SURGERY     LAPAROSCOPIC GASTRIC SLEEVE RESECTION N/A 02/19/2022   Procedure: LAPAROSCOPIC GASTRIC SLEEVE RESECTION, HIATAL HERNIA REPAIR;  Surgeon: Berna Bue, MD;  Location: [facility];  Service: General;  Laterality: N/A;   PARTIAL COLECTOMY N/A 08/31/2022   Procedure: OPEN PARTIAL COLECTOMY WITH COLOSTOMY;  Surgeon: Darnell Level, MD;  Location: [facility];  Service: General;  Laterality: N/A;   right knee replacement      TRIGGER FINGER RELEASE Right 04/2023   thumb, [facility] Dr. Lelon Perla   TUBAL LIGATION     UPPER GI ENDOSCOPY N/A 02/19/2022   Procedure: UPPER GI ENDOSCOPY;  Surgeon: Berna Bue, MD;  Location: [facility];  Service: General;  Laterality: N/A;    Family History  Problem Relation Age of Onset   Hypertension Mother    Cancer Sister    Diabetes Sister    Liver  disease Neg Hx    Esophageal cancer Neg Hx    Colon cancer Neg Hx    Rectal cancer Neg Hx    Stomach cancer Neg Hx     Social:  reports that she has quit smoking. She has never used smokeless tobacco. She reports current alcohol use. She reports that she does not use drugs.  Allergies:  Allergies  Allergen Reactions   Benazepril Swelling   Other Nausea And Vomiting    Post anesthesia   Sulfa Antibiotics Itching and Nausea Only   Elemental Sulfur Itching and Nausea Only     Medications: I have reviewed the patient's current medications.  No results found for this or any previous visit (from the past 48 hour(s)).  No results found.  PE There were no vitals taken for this visit. Constitutional: NAD; conversant Eyes: Moist conjunctiva Lungs: Normal respiratory effort GI: Abd soft, NT/ND MSK: Normal range of motion of extremities Psychiatric: Appropriate affect  No results found for this or any previous visit (from the past 48 hour(s)).  No results found.   A/P: Kristin Gibson is an 67 y.o. female with hx of sleeve gastrectomy, afib (on eliquis), HTN, GERD here for evaluation of colostomy reversal consideration  Hx of exlap/Hartmann's for perforated diverticulitis 08/2022 Path - benign  Cscope - Dr. Barron Alvine 06/2023 - single hyperplastic polyp removed; heathy appearing Hartmann's stump. -Gastrografin enema via anus unsuccessful 2/2 stool balls - presumably clear based on scope  -The anatomy and physiology of the GI tract was reviewed with the patient as a pertains to her current anatomy -She remains highly motivated to have her colostomy reversed her quality of life cited indications. We have discussed that it is certainly possible to live a "normal" life with a colostomy. -We have discussed robotic assisted colostomy takedown, possible low anterior resection based on intraoperative findings and potential rectosigmoid stump; flexible sigmoidoscopy; intraoperative assessment fusion with ICG; ureteral ICG/firefly ([facility]). Also reviewed scenarios where 'open' surgery could be necessary.  -The planned procedures, material risks (including, but not limited to, pain, bleeding, infection, scarring, need for blood transfusion, damage to surrounding structures- blood vessels/nerves/viscus/organs, damage to ureter, urine leak, leak from anastomosis, need for additional procedures, low anterior resection syndrome (LARS) = increased fecal urgency  and/or frequency, scenarios where a stoma may be necessary and where it may be permanent, worsening of pre-existing medical conditions, chronic diarrhea, constipation secondary to narcotic use, hernia, recurrence, pneumonia, heart attack, stroke, death) benefits and alternatives to surgery were discussed at length. The patient's questions were answered to her satisfaction, she voiced understanding and elected to proceed with surgery. Additionally, we discussed typical postoperative expectations and the recovery process.   Marin Olp, MD [facility]

## 2023-09-05 NOTE — Transfer of Care (Signed)
Immediate Anesthesia Transfer of Care Note  Patient: Kristin Gibson  Procedure(s) Performed: XI ROBOTIC ASSISTED COLOSTOMY REVERAL; LYSIS OF ADHESIONS, BILATERAL TAP BLOCK, REPAIR OF SMALL BOWEL X 4 FLEXIBLE SIGMOIDOSCOPY CYSTOSCOPY with FIREFLY INJECTION  Patient Location: PACU  Anesthesia Type:General  Level of Consciousness: awake and oriented  Airway & Oxygen Therapy: Patient Spontanous Breathing and Patient connected to face mask oxygen  Post-op Assessment: Report given to RN and Post -op Vital signs reviewed and stable  Post vital signs: Reviewed and stable  Last Vitals:  Vitals Value Taken Time  BP 162/102 09/05/23 1815  Temp    Pulse 96 09/05/23 1819  Resp 15 09/05/23 1819  SpO2 100 % 09/05/23 1819  Vitals shown include unfiled device data.  Last Pain:  Vitals:   09/05/23 1101  TempSrc:   PainSc: 5       Patients Stated Pain Goal: 4 (09/05/23 1101)  Complications: No notable events documented.

## 2023-09-06 ENCOUNTER — Encounter (HOSPITAL_COMMUNITY): Payer: Self-pay | Admitting: Surgery

## 2023-09-06 LAB — CBC
HCT: 40.4 % (ref 36.0–46.0)
Hemoglobin: 13 g/dL (ref 12.0–15.0)
MCH: 30.4 pg (ref 26.0–34.0)
MCHC: 32.2 g/dL (ref 30.0–36.0)
MCV: 94.4 fL (ref 80.0–100.0)
Platelets: 167 10*3/uL (ref 150–400)
RBC: 4.28 MIL/uL (ref 3.87–5.11)
RDW: 13.7 % (ref 11.5–15.5)
WBC: 8.5 10*3/uL (ref 4.0–10.5)
nRBC: 0 % (ref 0.0–0.2)

## 2023-09-06 LAB — BASIC METABOLIC PANEL
Anion gap: 9 (ref 5–15)
BUN: 11 mg/dL (ref 8–23)
CO2: 24 mmol/L (ref 22–32)
Calcium: 8.9 mg/dL (ref 8.9–10.3)
Chloride: 104 mmol/L (ref 98–111)
Creatinine, Ser: 0.78 mg/dL (ref 0.44–1.00)
GFR, Estimated: >60 mL/min (ref >60)
Glucose, Bld: 136 mg/dL — ABNORMAL HIGH (ref 70–99)
Potassium: 4.2 mmol/L (ref 3.5–5.1)
Sodium: 137 mmol/L (ref 135–145)

## 2023-09-06 MED ORDER — CARMEX CLASSIC LIP BALM EX OINT
TOPICAL_OINTMENT | CUTANEOUS | Status: DC | PRN
  Administered 2023-09-06: 1 via TOPICAL
  Filled 2023-09-06: qty 10

## 2023-09-06 MED ORDER — MENTHOL 3 MG MT LOZG
1.0000 | LOZENGE | OROMUCOSAL | Status: DC | PRN
  Administered 2023-09-06: 3 mg via ORAL
  Filled 2023-09-06: qty 9

## 2023-09-06 NOTE — Discharge Instructions (Signed)
POST OP INSTRUCTIONS AFTER COLON SURGERY  DIET: Be sure to include lots of fluids daily to stay hydrated - 64oz of water per day (8, 8 oz glasses).  Avoid fast food or heavy meals for the first couple of weeks as your are more likely to get nauseated. Avoid raw/uncooked fruits or vegetables for the first 4 weeks (its ok to have these if they are blended into smoothie form). If you have fruits/vegetables, make sure they are cooked until soft enough to mash on the roof of your mouth and chew your food well. Otherwise, diet as tolerated.  Take your usually prescribed home medications unless otherwise directed.  PAIN CONTROL: Pain is best controlled by a usual combination of three different methods TOGETHER: Ice/Heat Over the counter pain medication Prescription pain medication Most patients will experience some swelling and bruising around the surgical site.  Ice packs or heating pads (30-60 minutes up to 6 times a day) will help. Some people prefer to use ice alone, heat alone, alternating between ice & heat.  Experiment to what works for you.  Swelling and bruising can take several weeks to resolve.   It is helpful to take an over-the-counter pain medication regularly for the first few weeks: Ibuprofen (Motrin/Advil) - 200mg  tabs - take 3 tabs (600mg ) every 6 hours as needed for pain (unless you have been directed previously to avoid NSAIDs/ibuprofen) Acetaminophen (Tylenol) - you may take 650mg  every 6 hours as needed. You can take this with motrin as they act differently on the body. If you are taking a narcotic pain medication that has acetaminophen in it, do not take over the counter tylenol at the same time. NOTE: You may take both of these medications together - most patients  find it most helpful when alternating between the two (i.e. Ibuprofen at 6am, tylenol at 9am, ibuprofen at 12pm ..Marland Kitchen) A  prescription for pain medication should be given to you upon discharge.  Take your pain medication as  prescribed if your pain is not adequatly controlled with the over-the-counter pain reliefs mentioned above.  Avoid getting constipated.  Between the surgery and the pain medications, it is common to experience some constipation.  Increasing fluid intake and taking a fiber supplement (such as Metamucil, Citrucel, FiberCon, MiraLax, etc) 1-2 times a day regularly will usually help prevent this problem from occurring.  A mild laxative (prune juice, Milk of Magnesia, MiraLax, etc) should be taken according to package directions if there are no bowel movements after 48 hours.    Dressing: Your incisions have staples in place. You may bathe normally on 09/08/23 with soap/water over all wounds including former ostomy site. Cover former ostomy site with clean gauze daily. This will keep your clothes cleans. Avoid baths/pools/lakes/oceans until your wounds have fully healed.  ACTIVITIES as tolerated:   Avoid heavy lifting (>10lbs or 1 gallon of milk) for the next 6 weeks. You may resume regular daily activities as tolerated--such as daily self-care, walking, climbing stairs--gradually increasing activities as tolerated.  If you can walk 30 minutes without difficulty, it is safe to try more intense activity such as jogging, treadmill, bicycling, low-impact aerobics.  DO NOT PUSH THROUGH PAIN.  Let pain be your guide: If it hurts to do something, don't do it. You may drive when you are no longer taking prescription pain medication, you can comfortably wear a seatbelt, and you can safely maneuver your car and apply brakes.  FOLLOW UP in our office Please call [facility] at [phone]  to set up an appointment to see your surgeon in the office for a follow-up appointment approximately 2 weeks after your surgery. Make sure that you call for this appointment the day you arrive home to insure a convenient appointment time.  9. If you have disability or family leave forms that need to be completed, you may have them  completed by your primary care physician's office; for return to work instructions, please ask our office staff and they will be happy to assist you in obtaining this documentation   When to call us [phone]: Poor pain control Reactions / problems with new medications (rash/itching, etc)  Fever over 101.5 F (38.5 C) Inability to urinate Nausea/vomiting Worsening swelling or bruising Continued bleeding from incision. Increased pain, redness, or drainage from the incision  The clinic staff is available to answer your questions during regular business hours (8:30am-5pm).  Please don't hesitate to call and ask to speak to one of our nurses for clinical concerns.   A surgeon from [facility] is always on call at the hospitals   If you have a medical emergency, go to the nearest emergency room or call 911.  [facility] [address], [place], [place]  [address] MAIN: [phone] FAX: [phone] [email or web address]

## 2023-09-06 NOTE — TOC CM/SW Note (Signed)
Transition of Care [facility]) - Inpatient Brief Assessment  Patient Details  Name: Kristin Gibson MRN: [id number] Date of Birth: 1956/10/27  [facility] CM/SW Contact:    Ewing Schlein, LCSW Phone Number: 09/06/2023, 12:58 PM  Clinical Narrative: PT evaluation did not recommend any follow up. CSW confirmed with patient she has a rolling walker at home that she received last year during her hospitalization. No TOC needs identified at this time.  Transition of Care Asessment: Insurance and Status: Insurance coverage has been reviewed Patient has primary care physician: Yes Home environment has been reviewed: Lives alone in an apartment Prior level of function:: Independent at baseline Prior/Current Home Services: No current home services Social Determinants of Health Reivew: SDOH reviewed no interventions necessary Readmission risk has been reviewed: Yes Transition of care needs: no transition of care needs at this time

## 2023-09-06 NOTE — Evaluation (Signed)
Physical Therapy Evaluation Patient Details Name: Kristin Gibson MRN: [id number] DOB: 08/07/1956 Today's Date: 09/06/2023  History of Present Illness  Pt is a 67 yo female with HTN, GERD, and admitted for possible colostomy reversal.  Clinical Impression  Pt presents with some pain and decreased mobility secondary to colostomy reversal. Pt reports she lives alone, but feels she will be fine. Pt ambulated with supervision on the unit with a RW. Anticipate pt will be mod independent shortly and recommend d/c home with no follow-up therapy. Pt will continue to benefit from acute skilled PT to maximize mobility and independence until d/c home.       If plan is discharge home, recommend the following: Assist for transportation;Help with stairs or ramp for entrance   Can travel by private vehicle        Equipment Recommendations Rolling walker (2 wheels)  Recommendations for Other Services       Functional Status Assessment Patient has had a recent decline in their functional status and demonstrates the ability to make significant improvements in function in a reasonable and predictable amount of time.     Precautions / Restrictions Precautions Precautions: None Restrictions Weight Bearing Restrictions: No      Mobility  Bed Mobility Overal bed mobility: Needs Assistance Bed Mobility: Supine to Sit   Sidelying to sit: Supervision       General bed mobility comments: cues for technique to minimize discomfort    Transfers Overall transfer level: Needs assistance Equipment used: Rolling walker (2 wheels) Transfers: Sit to/from Stand Sit to Stand: Supervision                Ambulation/Gait Ambulation/Gait assistance: Supervision Gait Distance (Feet): 125 Feet Assistive device: Rolling walker (2 wheels) Gait Pattern/deviations: Step-through pattern, Decreased stride length Gait velocity: decreased        Stairs            Wheelchair Mobility      Tilt Bed    Modified Rankin (Stroke Patients Only)       Balance Overall balance assessment: Needs assistance Sitting-balance support: No upper extremity supported Sitting balance-Leahy Scale: Normal     Standing balance support: During functional activity, Bilateral upper extremity supported Standing balance-Leahy Scale: Fair                               Pertinent Vitals/Pain Pain Assessment Pain Assessment: 0-10 Pain Score: 6  Pain Location: stomach Pain Descriptors / Indicators: Sore Pain Intervention(s): Limited activity within patient's tolerance    Home Living Family/patient expects to be discharged to:: Private residence Living Arrangements: Alone   Type of Home: House Home Access: Level entry       Home Layout: One level Home Equipment: Agricultural consultant (2 wheels)      Prior Function                       Extremity/Trunk Assessment        Lower Extremity Assessment Lower Extremity Assessment: LLE deficits/detail LLE Deficits / Details: quad 4/5       Communication   Communication Communication: No apparent difficulties  Cognition Arousal: Alert Behavior During Therapy: WFL for tasks assessed/performed Overall Cognitive Status: Within Functional Limits for tasks assessed  General Comments      Exercises     Assessment/Plan    PT Assessment Patient needs continued PT services  PT Problem List Decreased strength;Decreased activity tolerance;Decreased safety awareness;Decreased balance;Decreased mobility       PT Treatment Interventions DME instruction;Gait training;Patient/family education;Functional mobility training;Therapeutic activities;Therapeutic exercise;Balance training    PT Goals (Current goals can be found in the Care Plan section)  Acute Rehab PT Goals Patient Stated Goal: to return home PT Goal Formulation: With patient Time For Goal  Achievement: 09/20/23 Potential to Achieve Goals: Good    Frequency Min 1X/week     Co-evaluation               AM-PAC PT "6 Clicks" Mobility  Outcome Measure Help needed turning from your back to your side while in a flat bed without using bedrails?: A Little Help needed moving from lying on your back to sitting on the side of a flat bed without using bedrails?: A Little Help needed moving to and from a bed to a chair (including a wheelchair)?: A Little Help needed standing up from a chair using your arms (e.g., wheelchair or bedside chair)?: A Little Help needed to walk in hospital room?: A Little Help needed climbing 3-5 steps with a railing? : A Little 6 Click Score: 18    End of Session Equipment Utilized During Treatment: Gait belt Activity Tolerance: Patient tolerated treatment well Patient left: in bed;with call bell/phone within reach;with bed alarm set Nurse Communication: Mobility status PT Visit Diagnosis: Difficulty in walking, not elsewhere classified (R26.2)    Time: 4010-2725 PT Time Calculation (min) (ACUTE ONLY): 22 min   Charges:   PT Evaluation $PT Eval Moderate Complexity: 1 Mod           Kristin Gibson Kristin Gibson, PT  Kristin Gibson 09/06/2023, 12:23 PM

## 2023-09-06 NOTE — Progress Notes (Signed)
   09/06/23 2336  BiPAP/CPAP/SIPAP  $ Non-Invasive Home Ventilator  Initial  $ Face Mask Medium Yes  BiPAP/CPAP/SIPAP Pt Type Adult  BiPAP/CPAP/SIPAP DREAMSTATIOND  Mask Type Full face mask  Mask Size Medium  Respiratory Rate 18 breaths/min  FiO2 (%) 21 %  Patient Home Equipment No  Auto Titrate Yes (5-20)

## 2023-09-06 NOTE — Evaluation (Signed)
Occupational Therapy Evaluation Patient Details Name: Kristin Gibson MRN: [id number] DOB: 01-28-1956 Today's Date: 09/06/2023   History of Present Illness Pt is a 67 yo female with HTN, GERD, and admitted for possible colostomy reversal.   Clinical Impression   Pt was independent prior to admission. Presents with abdominal pain, but able to mobilize with increased time and supervision with RW. Pt with increased pain when attempting to perform figure 4 for LE dressing. She has a reacher, sock aid and long handled bath brush at home. Her shower does not accommodate a shower seat, Recommended pt shower when someone is in the home. Educated in IADLs to avoid that require lifting. Pt verbalized understanding. Encouraged continued ambulation with staff. No further OT needs.       If plan is discharge home, recommend the following: Assist for transportation;Assistance with cooking/housework    Functional Status Assessment  Patient has had a recent decline in their functional status and demonstrates the ability to make significant improvements in function in a reasonable and predictable amount of time.  Equipment Recommendations  None recommended by OT    Recommendations for Other Services       Precautions / Restrictions Precautions Precautions: None Precaution Comments: abdominal incision Restrictions Weight Bearing Restrictions: No      Mobility Bed Mobility Overal bed mobility: Needs Assistance Bed Mobility: Rolling, Sidelying to Sit, Sit to Sidelying Rolling: Modified independent (Device/Increase time) Sidelying to sit: Supervision     Sit to sidelying: Supervision General bed mobility comments: cues for technique to minimize discomfort    Transfers Overall transfer level: Needs assistance Equipment used: Rolling walker (2 wheels) Transfers: Sit to/from Stand Sit to Stand: Supervision                  Balance Overall balance assessment: Needs assistance    Sitting balance-Leahy Scale: Normal     Standing balance support: During functional activity, Bilateral upper extremity supported Standing balance-Leahy Scale: Fair                             ADL either performed or assessed with clinical judgement   ADL Overall ADL's : Needs assistance/impaired Eating/Feeding: Independent;Bed level   Grooming: Standing;Supervision/safety   Upper Body Bathing: Set up;Sitting Upper Body Bathing Details (indicate cue type and reason): pt to use back brush Lower Body Bathing: Supervison/ safety;Sit to/from stand Lower Body Bathing Details (indicate cue type and reason): pt to use back brush at home Upper Body Dressing : Set up;Sitting   Lower Body Dressing: Supervision/safety;Sit to/from stand Lower Body Dressing Details (indicate cue type and reason): pt has sock aide and reacher at home Toilet Transfer: Supervision/safety   Toileting- Clothing Manipulation and Hygiene: Supervision/safety;Sit to/from stand       Functional mobility during ADLs: Supervision/safety;Rolling walker (2 wheels)       Vision Baseline Vision/History: 1 Wears glasses Ability to See in Adequate Light: 0 Adequate Patient Visual Report: No change from baseline       Perception         Praxis         Pertinent Vitals/Pain Pain Assessment Pain Assessment: Faces Faces Pain Scale: Hurts even more Pain Location: abdomen Pain Descriptors / Indicators: Sore Pain Intervention(s): Monitored during session, Repositioned     Extremity/Trunk Assessment Upper Extremity Assessment Upper Extremity Assessment: Overall WFL for tasks assessed   Lower Extremity Assessment Lower Extremity Assessment: Overall WFL for tasks assessed LLE Deficits /  Details: quad 4/5   Cervical / Trunk Assessment Cervical / Trunk Assessment: Other exceptions Cervical / Trunk Exceptions: s/p abdominal   Communication Communication Communication: No apparent difficulties    Cognition Arousal: Alert Behavior During Therapy: WFL for tasks assessed/performed Overall Cognitive Status: Within Functional Limits for tasks assessed                                       General Comments       Exercises     Shoulder Instructions      Home Living Family/patient expects to be discharged to:: Private residence Living Arrangements: Alone Available Help at Discharge: Available PRN/intermittently;Family;Friend(s) Type of Home: House Home Access: Level entry     Home Layout: One level     Bathroom Shower/Tub: Producer, television/film/video: Standard     Home Equipment: Agricultural consultant (2 wheels);Adaptive equipment Adaptive Equipment: Sock aid;Reacher;Long-handled sponge        Prior Functioning/Environment Prior Level of Function : Independent/Modified Independent                        OT Problem List:        OT Treatment/Interventions:      OT Goals(Current goals can be found in the care plan section)    OT Frequency:      Co-evaluation              AM-PAC OT "6 Clicks" Daily Activity     Outcome Measure Help from another person eating meals?: None Help from another person taking care of personal grooming?: A Little Help from another person toileting, which includes using toliet, bedpan, or urinal?: A Little Help from another person bathing (including washing, rinsing, drying)?: A Little Help from another person to put on and taking off regular upper body clothing?: None Help from another person to put on and taking off regular lower body clothing?: A Little 6 Click Score: 20   End of Session    Activity Tolerance: Patient tolerated treatment well Patient left: in bed;with call bell/phone within reach  OT Visit Diagnosis: Pain                Time: 6063-0160 OT Time Calculation (min): 21 min Charges:  OT General Charges $OT Visit: 1 Visit OT Evaluation $OT Eval Low Complexity: 1 Low Berna Spare,  OTR/L Acute Rehabilitation Services Office: [phone]   Evern Bio 09/06/2023, 3:26 PM

## 2023-09-06 NOTE — Progress Notes (Signed)
Subjective No acute events. Soreness about incisions. Minimal NG output. No flatus/BM yet.  Objective: Vital signs in last 24 hours: Temp:  [97.6 F (36.4 C)-99.1 F (37.3 C)] 99 F (37.2 C) (09/13 0411) Pulse Rate:  [49-90] 71 (09/13 0411) Resp:  [11-21] 18 (09/13 0411) BP: (102-169)/(71-111) 133/81 (09/13 0411) SpO2:  [92 %-100 %] 98 % (09/13 0411) Weight:  [113.4 kg-114.7 kg] 114.7 kg (09/13 0411)    Intake/Output from previous day: 09/12 0701 - 09/13 0700 In: 2548.3 [P.O.:120; I.V.:2328.3; IV Piggyback:100] Out: 1435 [Urine:1425; Emesis/NG output:10] Intake/Output this shift: No intake/output data recorded.  Gen: NAD, comfortable CV: RRR Pulm: Normal work of breathing Abd: Soft, mild tenderness around incisions, nondistended. Wounds clean and dry Ext: SCDs in place  Lab Results: CBC  Recent Labs    09/06/23 0446  WBC 8.5  HGB 13.0  HCT 40.4  PLT 167   BMET Recent Labs    09/06/23 0446  NA 137  K 4.2  CL 104  CO2 24  GLUCOSE 136*  BUN 11  CREATININE 0.78  CALCIUM 8.9   PT/INR No results for input(s): "LABPROT", "INR" in the last 72 hours. ABG No results for input(s): "PHART", "HCO3" in the last 72 hours.  Invalid input(s): "PCO2", "PO2"  Studies/Results:  Anti-infectives: Anti-infectives (From admission, onward)    Start     Dose/Rate Route Frequency Ordered Stop   09/05/23 1400  neomycin (MYCIFRADIN) tablet 1,000 mg  Status:  Discontinued       Placed in "And" Linked Group   1,000 mg Oral 3 times per day 09/05/23 1007 09/05/23 2125   09/05/23 1400  metroNIDAZOLE (FLAGYL) tablet 1,000 mg  Status:  Discontinued       Placed in "And" Linked Group   1,000 mg Oral 3 times per day 09/05/23 1007 09/05/23 2125   09/05/23 1015  cefoTEtan (CEFOTAN) 2 g in sodium chloride 0.9 % 100 mL IVPB        2 g 200 mL/hr over 30 Minutes Intravenous On call to O.R. 09/05/23 1007 09/05/23 1345        Assessment/Plan: Patient Active Problem List    Diagnosis Date Noted   S/P colostomy takedown 09/05/2023   Parastomal hernia without obstruction or gangrene 06/11/2023   Retraction of stoma    Irritant contact dermatitis associated with fecal stoma    Colostomy complication (HCC)    Anemia of chronic disease 08/29/2022   Sigmoid diverticulitis 08/22/2022   Morbid obesity (HCC) 02/19/2022   Colon polyp 09/26/2017   Hemorrhoids 09/26/2017   Hypokalemia 07/29/2017   Major depressive disorder with single episode, in full remission (HCC) 07/29/2017   Constipation 02/25/2017   Diverticulitis of large intestine 02/25/2017   Lower abdominal pain 02/25/2017   GERD (gastroesophageal reflux disease) 01/15/2017   Morbid obesity with BMI of 40.0-44.9, adult (HCC) 01/15/2017   OAB (overactive bladder) 01/15/2017   OSA (obstructive sleep apnea) 12/04/2016   Primary osteoarthritis of both first carpometacarpal joints 11/20/2016   Primary osteoarthritis of left knee 11/20/2016   Primary osteoarthritis of right knee 11/20/2016   Chronic pain of both knees 11/05/2016   Edema leg 11/05/2016   Hypertension 02/19/2008   ARTHRITIS 02/19/2008   LOW BACK PAIN 02/19/2008   s/p Procedure(s): XI ROBOTIC ASSISTED COLOSTOMY REVERAL; LYSIS OF ADHESIONS, BILATERAL TAP BLOCK, REPAIR OF SMALL BOWEL X 4 FLEXIBLE SIGMOIDOSCOPY CYSTOSCOPY with FIREFLY INJECTION 09/05/2023  - Doing well - D/C NG - Clears, advance as tolerated - Ambulate 5x/day - Former ostomy  site packing out 9/14 - Ppx: SQH, SCD  Spent time reviewing procedure, findings and plans. All questions answered. General expectations postop also reviewed.    LOS: 1 day   Marin Olp, MD [facility], [facility]

## 2023-09-07 LAB — CBC
HCT: 38.7 % (ref 36.0–46.0)
Hemoglobin: 12.2 g/dL (ref 12.0–15.0)
MCH: 29.5 pg (ref 26.0–34.0)
MCHC: 31.5 g/dL (ref 30.0–36.0)
MCV: 93.7 fL (ref 80.0–100.0)
Platelets: 153 10*3/uL (ref 150–400)
RBC: 4.13 MIL/uL (ref 3.87–5.11)
RDW: 14 % (ref 11.5–15.5)
WBC: 6.5 10*3/uL (ref 4.0–10.5)
nRBC: 0 % (ref 0.0–0.2)

## 2023-09-07 LAB — BASIC METABOLIC PANEL
Anion gap: 8 (ref 5–15)
BUN: 12 mg/dL (ref 8–23)
CO2: 25 mmol/L (ref 22–32)
Calcium: 8.4 mg/dL — ABNORMAL LOW (ref 8.9–10.3)
Chloride: 103 mmol/L (ref 98–111)
Creatinine, Ser: 0.8 mg/dL (ref 0.44–1.00)
GFR, Estimated: >60 mL/min (ref >60)
Glucose, Bld: 95 mg/dL (ref 70–99)
Potassium: 3.6 mmol/L (ref 3.5–5.1)
Sodium: 136 mmol/L (ref 135–145)

## 2023-09-07 NOTE — Progress Notes (Signed)
Subjective No acute events. Soreness around incisions. Having bowel function.  Objective: Vital signs in last 24 hours: Temp:  [98.6 F (37 C)-99.2 F (37.3 C)] 98.8 F (37.1 C) (09/14 0608) Pulse Rate:  [58-77] 77 (09/14 0608) Resp:  [14-17] 16 (09/14 0608) BP: (130-153)/(85-91) 138/87 (09/14 0608) SpO2:  [93 %-98 %] 96 % (09/14 0608) FiO2 (%):  [21 %] 21 % (09/13 2336) Weight:  [110.1 kg] 110.1 kg (09/14 0338)    Intake/Output from previous day: 09/13 0701 - 09/14 0700 In: 1420 [P.O.:1420] Out: 1100 [Urine:1100] Intake/Output this shift: Total I/O In: -  Out: 301 [Urine:300; Stool:1]  Gen: NAD, comfortable Pulm: Normal work of breathing Abd: Soft, mild tenderness around incisions, nondistended. Wounds clean and dry Ext: SCDs in place  Lab Results: CBC  Recent Labs    09/06/23 0446 09/07/23 0519  WBC 8.5 6.5  HGB 13.0 12.2  HCT 40.4 38.7  PLT 167 153   BMET Recent Labs    09/06/23 0446 09/07/23 0519  NA 137 136  K 4.2 3.6  CL 104 103  CO2 24 25  GLUCOSE 136* 95  BUN 11 12  CREATININE 0.78 0.80  CALCIUM 8.9 8.4*   PT/INR No results for input(s): "LABPROT", "INR" in the last 72 hours. ABG No results for input(s): "PHART", "HCO3" in the last 72 hours.  Invalid input(s): "PCO2", "PO2"  Studies/Results:  Anti-infectives: Anti-infectives (From admission, onward)    Start     Dose/Rate Route Frequency Ordered Stop   09/05/23 1400  neomycin (MYCIFRADIN) tablet 1,000 mg  Status:  Discontinued       Placed in "And" Linked Group   1,000 mg Oral 3 times per day 09/05/23 1007 09/05/23 2125   09/05/23 1400  metroNIDAZOLE (FLAGYL) tablet 1,000 mg  Status:  Discontinued       Placed in "And" Linked Group   1,000 mg Oral 3 times per day 09/05/23 1007 09/05/23 2125   09/05/23 1015  cefoTEtan (CEFOTAN) 2 g in sodium chloride 0.9 % 100 mL IVPB        2 g 200 mL/hr over 30 Minutes Intravenous On call to O.R. 09/05/23 1007 09/05/23 1345         Assessment/Plan: Patient Active Problem List   Diagnosis Date Noted   S/P colostomy takedown 09/05/2023   Parastomal hernia without obstruction or gangrene 06/11/2023   Retraction of stoma    Irritant contact dermatitis associated with fecal stoma    Colostomy complication (HCC)    Anemia of chronic disease 08/29/2022   Sigmoid diverticulitis 08/22/2022   Morbid obesity (HCC) 02/19/2022   Colon polyp 09/26/2017   Hemorrhoids 09/26/2017   Hypokalemia 07/29/2017   Major depressive disorder with single episode, in full remission (HCC) 07/29/2017   Constipation 02/25/2017   Diverticulitis of large intestine 02/25/2017   Lower abdominal pain 02/25/2017   GERD (gastroesophageal reflux disease) 01/15/2017   Morbid obesity with BMI of 40.0-44.9, adult (HCC) 01/15/2017   OAB (overactive bladder) 01/15/2017   OSA (obstructive sleep apnea) 12/04/2016   Primary osteoarthritis of both first carpometacarpal joints 11/20/2016   Primary osteoarthritis of left knee 11/20/2016   Primary osteoarthritis of right knee 11/20/2016   Chronic pain of both knees 11/05/2016   Edema leg 11/05/2016   Hypertension 02/19/2008   ARTHRITIS 02/19/2008   LOW BACK PAIN 02/19/2008   s/p Procedure(s): XI ROBOTIC ASSISTED COLOSTOMY REVERAL; LYSIS OF ADHESIONS, BILATERAL TAP BLOCK, REPAIR OF SMALL BOWEL X 4 FLEXIBLE SIGMOIDOSCOPY CYSTOSCOPY with FIREFLY INJECTION  09/05/2023  - Doing well  advance as tolerated - Ambulate 5x/day - Former ostomy site packing out 9/14 - Ppx: SQH, SCD  Spent time reviewing procedure, findings and plans. All questions answered. General expectations postop also reviewed.    LOS: 2 days   Vanita Panda, MD  Colorectal and General Surgery [facility]

## 2023-09-07 NOTE — Plan of Care (Signed)
  Problem: Education: Goal: Understanding of discharge needs will improve Outcome: Progressing   Problem: Activity: Goal: Ability to tolerate increased activity will improve Outcome: Progressing   Problem: Bowel/Gastric: Goal: Gastrointestinal status for postoperative course will improve Outcome: Progressing

## 2023-09-08 LAB — BASIC METABOLIC PANEL
Anion gap: 6 (ref 5–15)
BUN: 10 mg/dL (ref 8–23)
CO2: 27 mmol/L (ref 22–32)
Calcium: 8.5 mg/dL — ABNORMAL LOW (ref 8.9–10.3)
Chloride: 104 mmol/L (ref 98–111)
Creatinine, Ser: 0.81 mg/dL (ref 0.44–1.00)
GFR, Estimated: >60 mL/min (ref >60)
Glucose, Bld: 113 mg/dL — ABNORMAL HIGH (ref 70–99)
Potassium: 3.5 mmol/L (ref 3.5–5.1)
Sodium: 137 mmol/L (ref 135–145)

## 2023-09-08 LAB — CBC
HCT: 37.6 % (ref 36.0–46.0)
Hemoglobin: 11.6 g/dL — ABNORMAL LOW (ref 12.0–15.0)
MCH: 29.7 pg (ref 26.0–34.0)
MCHC: 30.9 g/dL (ref 30.0–36.0)
MCV: 96.2 fL (ref 80.0–100.0)
Platelets: 153 10*3/uL (ref 150–400)
RBC: 3.91 MIL/uL (ref 3.87–5.11)
RDW: 14 % (ref 11.5–15.5)
WBC: 5.7 10*3/uL (ref 4.0–10.5)
nRBC: 0 % (ref 0.0–0.2)

## 2023-09-08 MED ORDER — METHOCARBAMOL 500 MG PO TABS
1000.0000 mg | ORAL_TABLET | Freq: Four times a day (QID) | ORAL | Status: DC | PRN
  Administered 2023-09-08 – 2023-09-11 (×4): 1000 mg via ORAL
  Filled 2023-09-08 (×4): qty 2

## 2023-09-08 NOTE — Progress Notes (Signed)
   09/08/23 2342  BiPAP/CPAP/SIPAP  BiPAP/CPAP/SIPAP Pt Type Adult  BiPAP/CPAP/SIPAP DREAMSTATIOND (self placement)  Mask Type Nasal mask  Mask Size Medium  Respiratory Rate 16 breaths/min  FiO2 (%) 21 %  Patient Home Equipment No  Auto Titrate Yes (5-20)

## 2023-09-08 NOTE — Progress Notes (Signed)
Subjective No acute events. Soreness around incisions. Having bowel function.  Having more LLQ pain.  Having loose stools  Objective: Vital signs in last 24 hours: Temp:  [98.6 F (37 C)-98.7 F (37.1 C)] 98.6 F (37 C) (09/15 0440) Pulse Rate:  [60-87] 60 (09/15 0440) Resp:  [17-18] 18 (09/15 0440) BP: (121-168)/(83-97) 168/97 (09/15 0440) SpO2:  [93 %-98 %] 93 % (09/15 0440) Weight:  [113.6 kg] 113.6 kg (09/15 0435) Last BM Date : 09/07/23  Intake/Output from previous day: 09/14 0701 - 09/15 0700 In: 390 [P.O.:390] Out: 1602 [Urine:1600; Stool:2] Intake/Output this shift: Total I/O In: 480 [P.O.:480] Out: 400 [Urine:400]  Gen: NAD, comfortable Pulm: Normal work of breathing Abd: Soft, mild tenderness around incisions, nondistended. Wounds clean and dry, packing removed Ext: SCDs in place  Lab Results: CBC  Recent Labs    09/07/23 0519 09/08/23 0422  WBC 6.5 5.7  HGB 12.2 11.6*  HCT 38.7 37.6  PLT 153 153   BMET Recent Labs    09/07/23 0519 09/08/23 0422  NA 136 137  K 3.6 3.5  CL 103 104  CO2 25 27  GLUCOSE 95 113*  BUN 12 10  CREATININE 0.80 0.81  CALCIUM 8.4* 8.5*   PT/INR No results for input(s): "LABPROT", "INR" in the last 72 hours. ABG No results for input(s): "PHART", "HCO3" in the last 72 hours.  Invalid input(s): "PCO2", "PO2"  Studies/Results:  Anti-infectives: Anti-infectives (From admission, onward)    Start     Dose/Rate Route Frequency Ordered Stop   09/05/23 1400  neomycin (MYCIFRADIN) tablet 1,000 mg  Status:  Discontinued       Placed in "And" Linked Group   1,000 mg Oral 3 times per day 09/05/23 1007 09/05/23 2125   09/05/23 1400  metroNIDAZOLE (FLAGYL) tablet 1,000 mg  Status:  Discontinued       Placed in "And" Linked Group   1,000 mg Oral 3 times per day 09/05/23 1007 09/05/23 2125   09/05/23 1015  cefoTEtan (CEFOTAN) 2 g in sodium chloride 0.9 % 100 mL IVPB        2 g 200 mL/hr over 30 Minutes Intravenous On call to  O.R. 09/05/23 1007 09/05/23 1345        Assessment/Plan: Patient Active Problem List   Diagnosis Date Noted   S/P colostomy takedown 09/05/2023   Parastomal hernia without obstruction or gangrene 06/11/2023   Retraction of stoma    Irritant contact dermatitis associated with fecal stoma    Colostomy complication (HCC)    Anemia of chronic disease 08/29/2022   Sigmoid diverticulitis 08/22/2022   Morbid obesity (HCC) 02/19/2022   Colon polyp 09/26/2017   Hemorrhoids 09/26/2017   Hypokalemia 07/29/2017   Major depressive disorder with single episode, in full remission (HCC) 07/29/2017   Constipation 02/25/2017   Diverticulitis of large intestine 02/25/2017   Lower abdominal pain 02/25/2017   GERD (gastroesophageal reflux disease) 01/15/2017   Morbid obesity with BMI of 40.0-44.9, adult (HCC) 01/15/2017   OAB (overactive bladder) 01/15/2017   OSA (obstructive sleep apnea) 12/04/2016   Primary osteoarthritis of both first carpometacarpal joints 11/20/2016   Primary osteoarthritis of left knee 11/20/2016   Primary osteoarthritis of right knee 11/20/2016   Chronic pain of both knees 11/05/2016   Edema leg 11/05/2016   Hypertension 02/19/2008   ARTHRITIS 02/19/2008   LOW BACK PAIN 02/19/2008   s/p Procedure(s): XI ROBOTIC ASSISTED COLOSTOMY REVERAL; LYSIS OF ADHESIONS, BILATERAL TAP BLOCK, REPAIR OF SMALL BOWEL X 4 FLEXIBLE  SIGMOIDOSCOPY CYSTOSCOPY with FIREFLY INJECTION 09/05/2023  - Doing well, tolerating soft diet -still having some issues with pain control, PRN Robaxin ordered - Ambulate 5x/day - Former ostomy site packing out - Ppx: SQH, SCD  Spent time reviewing procedure, findings and plans. All questions answered. General expectations postop also reviewed.    LOS: 3 days   Vanita Panda, MD  Colorectal and General Surgery [facility]

## 2023-09-08 NOTE — Progress Notes (Signed)
Patient states she will place herself on CPAP when she is ready.

## 2023-09-09 LAB — SURGICAL PATHOLOGY

## 2023-09-09 MED ORDER — POLYETHYLENE GLYCOL 3350 17 G PO PACK
17.0000 g | PACK | Freq: Every day | ORAL | Status: DC
  Administered 2023-09-09 – 2023-09-11 (×3): 17 g via ORAL
  Filled 2023-09-09 (×3): qty 1

## 2023-09-09 NOTE — Progress Notes (Signed)
   09/09/23 2130  BiPAP/CPAP/SIPAP  BiPAP/CPAP/SIPAP Pt Type Adult  BiPAP/CPAP/SIPAP DREAMSTATIOND  Mask Type Nasal mask  Mask Size Medium  Respiratory Rate 16 breaths/min  FiO2 (%) 21 %  Patient Home Equipment No  Auto Titrate Yes  Press High Alarm  ((<5/>20))   Pt. stated, "able to place on independently", made aware to notify if needed, remains on room air.

## 2023-09-09 NOTE — Care Management Important Message (Signed)
Important Message  Patient Details IM Letter given. Name: Kristin Gibson MRN: [id number] Date of Birth: Jan 08, 1956   Medicare Important Message Given:  Yes     Caren Macadam 09/09/2023, 2:19 PM

## 2023-09-09 NOTE — Progress Notes (Signed)
Physical Therapy Treatment Patient Details Name: Kristin Gibson MRN: [id number] DOB: 1956-01-04 Today's Date: 09/09/2023   History of Present Illness Pt is a 67 yo female with HTN, GERD, and admitted for possible colostomy reversal.    PT Comments  General Comments: AxO x 3 pleasant Lady excited to have her colostomy gone. Assisted OOB to amb to bathroom then in hallway went well. Pt plans to return home.     If plan is discharge home, recommend the following: Assist for transportation;Help with stairs or ramp for entrance   Can travel by private vehicle        Equipment Recommendations  Rolling walker (2 wheels)    Recommendations for Other Services       Precautions / Restrictions Precautions Precautions: None Restrictions Weight Bearing Restrictions: No     Mobility  Bed Mobility Overal bed mobility: Needs Assistance Bed Mobility: Rolling, Sidelying to Sit, Sit to Sidelying Rolling: Supervision Sidelying to sit: Supervision       General bed mobility comments: cues for technique to minimize discomfort (partial Log Roll)    Transfers Overall transfer level: Needs assistance Equipment used: Rolling walker (2 wheels) Transfers: Sit to/from Stand Sit to Stand: Supervision           General transfer comment: good safety cognition and use of hands to steady self    Ambulation/Gait Ambulation/Gait assistance: Supervision Gait Distance (Feet): 175 Feet Assistive device: Rolling walker (2 wheels) Gait Pattern/deviations: Step-through pattern, Decreased stride length Gait velocity: decreased     General Gait Details: tolerated an increased distance in hallway with a "light" need for walker for balance   Stairs             Wheelchair Mobility     Tilt Bed    Modified Rankin (Stroke Patients Only)       Balance                                            Cognition Arousal: Alert Behavior During Therapy: WFL for  tasks assessed/performed Overall Cognitive Status: Within Functional Limits for tasks assessed                                 General Comments: AxO x 3 pleasant Lady excited to have her colostomy gone.        Exercises      General Comments        Pertinent Vitals/Pain Pain Assessment Faces Pain Scale: Hurts a little bit Pain Location: abdomen Pain Descriptors / Indicators: Sore, Grimacing, Tender Pain Intervention(s): Monitored during session, Premedicated before session, Repositioned    Home Living                          Prior Function            PT Goals (current goals can now be found in the care plan section) Progress towards PT goals: Progressing toward goals    Frequency    Min 1X/week      PT Plan      Co-evaluation              AM-PAC PT "6 Clicks" Mobility   Outcome Measure  Help needed turning from your back to your side while in a flat bed  without using bedrails?: A Little Help needed moving from lying on your back to sitting on the side of a flat bed without using bedrails?: A Little Help needed moving to and from a bed to a chair (including a wheelchair)?: A Little Help needed standing up from a chair using your arms (e.g., wheelchair or bedside chair)?: A Little Help needed to walk in hospital room?: A Little Help needed climbing 3-5 steps with a railing? : A Little 6 Click Score: 18    End of Session Equipment Utilized During Treatment: Gait belt Activity Tolerance: Patient tolerated treatment well Patient left: in bed;with call bell/phone within reach;with bed alarm set Nurse Communication: Mobility status PT Visit Diagnosis: Difficulty in walking, not elsewhere classified (R26.2)     Time: 8119-1478 PT Time Calculation (min) (ACUTE ONLY): 12 min  Charges:    $Gait Training: 8-22 mins PT General Charges $$ ACUTE PT VISIT: 1 Visit                     Felecia Shelling  PTA Acute  Rehabilitation  Services Office M-F          [phone]

## 2023-09-09 NOTE — Progress Notes (Signed)
Subjective No acute events. Soreness around incisions. No flatus/BM since Friday she reports. That said has been tolerating soft diet without nausea/vomiting/increased bloating.   Objective: Vital signs in last 24 hours: Temp:  [98.9 F (37.2 C)-100 F (37.8 C)] 98.9 F (37.2 C) (09/16 1227) Pulse Rate:  [58-64] 59 (09/16 1227) Resp:  [16-18] 17 (09/16 1227) BP: (147-153)/(86-99) 151/86 (09/16 1227) SpO2:  [95 %-99 %] 99 % (09/16 1227) FiO2 (%):  [21 %] 21 % (09/15 2342) Last BM Date : 09/07/23  Intake/Output from previous day: 09/15 0701 - 09/16 0700 In: 1440 [P.O.:1440] Out: 950 [Urine:950] Intake/Output this shift: Total I/O In: 600 [P.O.:600] Out: 1300 [Urine:1300]  Gen: NAD, comfortable Pulm: Normal work of breathing Abd: Soft, mild tenderness around incisions, nondistended. Wounds clean and dry, former ostomy site clean Ext: SCDs in place  Lab Results: CBC  Recent Labs    09/07/23 0519 09/08/23 0422  WBC 6.5 5.7  HGB 12.2 11.6*  HCT 38.7 37.6  PLT 153 153   BMET Recent Labs    09/07/23 0519 09/08/23 0422  NA 136 137  K 3.6 3.5  CL 103 104  CO2 25 27  GLUCOSE 95 113*  BUN 12 10  CREATININE 0.80 0.81  CALCIUM 8.4* 8.5*   PT/INR No results for input(s): "LABPROT", "INR" in the last 72 hours. ABG No results for input(s): "PHART", "HCO3" in the last 72 hours.  Invalid input(s): "PCO2", "PO2"  Studies/Results:  Anti-infectives: Anti-infectives (From admission, onward)    Start     Dose/Rate Route Frequency Ordered Stop   09/05/23 1400  neomycin (MYCIFRADIN) tablet 1,000 mg  Status:  Discontinued       Placed in "And" Linked Group   1,000 mg Oral 3 times per day 09/05/23 1007 09/05/23 2125   09/05/23 1400  metroNIDAZOLE (FLAGYL) tablet 1,000 mg  Status:  Discontinued       Placed in "And" Linked Group   1,000 mg Oral 3 times per day 09/05/23 1007 09/05/23 2125   09/05/23 1015  cefoTEtan (CEFOTAN) 2 g in sodium chloride 0.9 % 100 mL IVPB         2 g 200 mL/hr over 30 Minutes Intravenous On call to O.R. 09/05/23 1007 09/05/23 1345        Assessment/Plan: Patient Active Problem List   Diagnosis Date Noted   S/P colostomy takedown 09/05/2023   Parastomal hernia without obstruction or gangrene 06/11/2023   Retraction of stoma    Irritant contact dermatitis associated with fecal stoma    Colostomy complication (HCC)    Anemia of chronic disease 08/29/2022   Sigmoid diverticulitis 08/22/2022   Morbid obesity (HCC) 02/19/2022   Colon polyp 09/26/2017   Hemorrhoids 09/26/2017   Hypokalemia 07/29/2017   Major depressive disorder with single episode, in full remission (HCC) 07/29/2017   Constipation 02/25/2017   Diverticulitis of large intestine 02/25/2017   Lower abdominal pain 02/25/2017   GERD (gastroesophageal reflux disease) 01/15/2017   Morbid obesity with BMI of 40.0-44.9, adult (HCC) 01/15/2017   OAB (overactive bladder) 01/15/2017   OSA (obstructive sleep apnea) 12/04/2016   Primary osteoarthritis of both first carpometacarpal joints 11/20/2016   Primary osteoarthritis of left knee 11/20/2016   Primary osteoarthritis of right knee 11/20/2016   Chronic pain of both knees 11/05/2016   Edema leg 11/05/2016   Hypertension 02/19/2008   ARTHRITIS 02/19/2008   LOW BACK PAIN 02/19/2008   s/p Procedure(s): XI ROBOTIC ASSISTED COLOSTOMY REVERAL; LYSIS OF ADHESIONS, BILATERAL TAP BLOCK,  REPAIR OF SMALL BOWEL X 4 FLEXIBLE SIGMOIDOSCOPY CYSTOSCOPY with FIREFLY INJECTION 09/05/2023  - Doing well, tolerating soft diet - Awaiting more bowel function - no flatus/BM since Friday - Ambulate 5x/day - Ppx: SQH, SCD    LOS: 4 days   Marin Olp, MD [facility]

## 2023-09-10 LAB — CBC WITH DIFFERENTIAL/PLATELET
Abs Immature Granulocytes: 0.03 10*3/uL (ref 0.00–0.07)
Basophils Absolute: 0 10*3/uL (ref 0.0–0.1)
Basophils Relative: 0 %
Eosinophils Absolute: 0.1 10*3/uL (ref 0.0–0.5)
Eosinophils Relative: 2 %
HCT: 39.4 % (ref 36.0–46.0)
Hemoglobin: 12.7 g/dL (ref 12.0–15.0)
Immature Granulocytes: 1 %
Lymphocytes Relative: 33 %
Lymphs Abs: 1.8 10*3/uL (ref 0.7–4.0)
MCH: 29.7 pg (ref 26.0–34.0)
MCHC: 32.2 g/dL (ref 30.0–36.0)
MCV: 92.1 fL (ref 80.0–100.0)
Monocytes Absolute: 0.5 10*3/uL (ref 0.1–1.0)
Monocytes Relative: 10 %
Neutro Abs: 3 10*3/uL (ref 1.7–7.7)
Neutrophils Relative %: 54 %
Platelets: 196 10*3/uL (ref 150–400)
RBC: 4.28 MIL/uL (ref 3.87–5.11)
RDW: 13.8 % (ref 11.5–15.5)
WBC: 5.5 10*3/uL (ref 4.0–10.5)
nRBC: 0 % (ref 0.0–0.2)

## 2023-09-10 LAB — BASIC METABOLIC PANEL
Anion gap: 11 (ref 5–15)
BUN: 10 mg/dL (ref 8–23)
CO2: 25 mmol/L (ref 22–32)
Calcium: 9.4 mg/dL (ref 8.9–10.3)
Chloride: 100 mmol/L (ref 98–111)
Creatinine, Ser: 0.65 mg/dL (ref 0.44–1.00)
GFR, Estimated: >60 mL/min (ref >60)
Glucose, Bld: 106 mg/dL — ABNORMAL HIGH (ref 70–99)
Potassium: 3.4 mmol/L — ABNORMAL LOW (ref 3.5–5.1)
Sodium: 136 mmol/L (ref 135–145)

## 2023-09-10 MED ORDER — AMLODIPINE BESYLATE 5 MG PO TABS
5.0000 mg | ORAL_TABLET | Freq: Every day | ORAL | Status: DC
  Administered 2023-09-10 – 2023-09-11 (×2): 5 mg via ORAL
  Filled 2023-09-10 (×2): qty 1

## 2023-09-10 NOTE — TOC Progression Note (Signed)
Transition of Care [facility]) - Progression Note    Patient Details  Name: Kristin Gibson MRN: [id number] Date of Birth: 04-Dec-1956  Transition of Care [facility]) CM/SW Contact  Darleene Cleaver, [place] Phone Number: 09/10/2023, 9:52 AM  Clinical Narrative:     Patient has a rolling walker at home.  No other PT needs anticipated.    Barriers to Discharge: Continued Medical Work up  Expected Discharge Plan and Services                                               Social Determinants of Health (SDOH) Interventions SDOH Screenings   Food Insecurity: Food Insecurity Present (01/29/2023)   Received from [facility], [facility]  Housing: Low Risk  (09/03/2022)  Transportation Needs: No Transportation Needs (01/29/2023)   Received from [facility], [facility]  Utilities: At Risk (01/29/2023)   Received from [facility], [facility]  Depression [phone]): Low Risk  (05/11/2021)  Financial Resource Strain: High Risk (01/29/2023)   Received from [facility], [facility]  Physical Activity: Insufficiently Active (12/18/2022)   Received from [facility], [facility]  Social Connections: Socially Integrated (12/18/2022)   Received from [facility], [facility]  Stress: No Stress Concern Present (04/29/2023)   Received from [facility], [facility]  Tobacco Use: Medium Risk (09/05/2023)    Readmission Risk Interventions    09/03/2022    3:44 PM  Readmission Risk Prevention Plan  PCP or Specialist Appt within 5-7 Days Complete  Home Care Screening Complete  Medication Review (RN CM) Complete

## 2023-09-10 NOTE — Progress Notes (Signed)
Mobility Specialist - Progress Note   09/10/23 1445  Mobility  Activity Ambulated with assistance in hallway;Ambulated with assistance to bathroom  Level of Assistance Standby assist, set-up cues, supervision of patient - no hands on  Assistive Device Front wheel walker  Distance Ambulated (ft) 300 ft  Range of Motion/Exercises Active  Activity Response Tolerated well  Mobility Referral Yes  $Mobility charge 1 Mobility  Mobility Specialist Start Time (ACUTE ONLY) 1430  Mobility Specialist Stop Time (ACUTE ONLY) 1445  Mobility Specialist Time Calculation (min) (ACUTE ONLY) 15 min   Pt was found in bed and agreeable to ambulate. C/o nausea and lower abdominal pain. At EOS returned to bed with all needs met. Call bell in reach.  Billey Chang Mobility Specialist

## 2023-09-11 ENCOUNTER — Other Ambulatory Visit (HOSPITAL_COMMUNITY): Payer: Self-pay

## 2023-09-11 MED ORDER — AMLODIPINE BESYLATE 5 MG PO TABS
5.0000 mg | ORAL_TABLET | Freq: Every day | ORAL | 1 refills | Status: DC
  Filled 2023-09-11: qty 30, 30d supply, fill #0

## 2023-09-11 MED ORDER — OXYCODONE HCL 5 MG PO TABS
5.0000 mg | ORAL_TABLET | Freq: Three times a day (TID) | ORAL | 0 refills | Status: AC | PRN
Start: 2023-09-11 — End: 2023-09-16
  Filled 2023-09-11: qty 15, 5d supply, fill #0

## 2023-09-11 MED ORDER — METHOCARBAMOL 750 MG PO TABS
750.0000 mg | ORAL_TABLET | Freq: Three times a day (TID) | ORAL | 0 refills | Status: DC | PRN
  Filled 2023-09-11: qty 30, 10d supply, fill #0

## 2023-09-11 NOTE — Progress Notes (Signed)
   09/10/23 2113  BiPAP/CPAP/SIPAP  BiPAP/CPAP/SIPAP Pt Type Adult  BiPAP/CPAP/SIPAP DREAMSTATIOND  Mask Type Nasal mask  Mask Size Medium  Respiratory Rate 16 breaths/min  FiO2 (%) 21 %  Patient Home Equipment No  Auto Titrate Yes  Press High Alarm  (<5/20)  BiPAP/CPAP /SiPAP Vitals  Temp 98.5 F (36.9 C)  Pulse Rate 80  Resp 18  BP (!) 165/95  SpO2 94 %  MEWS Score/Color  MEWS Score 0  MEWS Score Color Green   Pt. places CPAP on independently, remains aware to notify if help needed.

## 2023-09-11 NOTE — Discharge Summary (Signed)
Patient ID: Kristin Gibson MRN: [id number] DOB/AGE: 24-Aug-1956 67 y.o.  Admit date: 09/05/2023 Discharge date: 09/11/2023  Discharge Diagnoses Patient Active Problem List   Diagnosis Date Noted   S/P colostomy takedown 09/05/2023   Parastomal hernia without obstruction or gangrene 06/11/2023   Retraction of stoma    Irritant contact dermatitis associated with fecal stoma    Colostomy complication (HCC)    Anemia of chronic disease 08/29/2022   Sigmoid diverticulitis 08/22/2022   Morbid obesity (HCC) 02/19/2022   Colon polyp 09/26/2017   Hemorrhoids 09/26/2017   Hypokalemia 07/29/2017   Major depressive disorder with single episode, in full remission (HCC) 07/29/2017   Constipation 02/25/2017   Diverticulitis of large intestine 02/25/2017   Lower abdominal pain 02/25/2017   GERD (gastroesophageal reflux disease) 01/15/2017   Morbid obesity with BMI of 40.0-44.9, adult (HCC) 01/15/2017   OAB (overactive bladder) 01/15/2017   OSA (obstructive sleep apnea) 12/04/2016   Primary osteoarthritis of both first carpometacarpal joints 11/20/2016   Primary osteoarthritis of left knee 11/20/2016   Primary osteoarthritis of right knee 11/20/2016   Chronic pain of both knees 11/05/2016   Edema leg 11/05/2016   Hypertension 02/19/2008   ARTHRITIS 02/19/2008   LOW BACK PAIN 02/19/2008    Consultants none  Procedures OR 09/05/23 Robotic assisted takedown of end colostomy Repair of enterotomy, full thickness, x1 Repair of small bowel, partial thickness x3 Robotic lysis of adhesions x 150 minutes Flexible sigmoidoscopy Bilateral transversus abdominus plane (TAP) blocks   Hospital Course: She was admitted postoperatively.  Her NG tube was ultimately removed after she began having bowel function.  Her diet was gradually advanced.  She ultimately tolerated this well and was mobilizing well on her own.  On 09/09/2023, she did have cramping bloating and distention that rapidly resolved.   On 9/17, she felt much better.  On 9/18, she is comfortable with and stable for discharge home.  Postoperative expectations been reviewed.  All questions were answered.  Follow-up in my office has been arranged and she also has had a visit sent to our office for a staple removal prior to seeing me back.  She has been encouraged to call with any questions or concerns.   Allergies as of 09/11/2023       Reactions   Benazepril Swelling   Other Nausea And Vomiting   Post anesthesia   Sulfa Antibiotics Itching, Nausea Only   Elemental Sulfur Itching, Nausea Only        Medication List     STOP taking these medications    apixaban 5 MG Tabs tablet Commonly known as: ELIQUIS       TAKE these medications    acetaminophen 500 MG tablet Commonly known as: TYLENOL Take 1,000 mg by mouth daily as needed for mild pain. pain   amLODipine 5 MG tablet Commonly known as: NORVASC Take 1 tablet (5 mg total) by mouth daily.   Bariatric Fusion Chew Chew 1 tablet by mouth daily.   diclofenac Sodium 1 % Gel Commonly known as: VOLTAREN Apply 2 g topically daily as needed (pain).   dicyclomine 20 MG tablet Commonly known as: BENTYL Take 20 mg by mouth daily as needed for spasms.   fluconazole 150 MG tablet Commonly known as: Diflucan Take 1 tablet (150 mg total) by mouth daily as needed (yeast infection).   methocarbamol 750 MG tablet Commonly known as: Robaxin-750 Take 1 tablet (750 mg total) by mouth every 8 (eight) hours as needed for muscle spasms. What  changed:  medication strength how much to take   NON FORMULARY Pt uses a cpap nightly   nystatin 100000 UNIT/ML suspension Commonly known as: MYCOSTATIN Take 5 mLs (500,000 Units total) by mouth 4 (four) times daily as needed (mouth pain).   ondansetron 4 MG disintegrating tablet Commonly known as: ZOFRAN-ODT Take 1 tablet (4 mg total) by mouth every 6 (six) hours as needed for nausea or vomiting.   ondansetron 4 MG  tablet Commonly known as: ZOFRAN Take 1 tablet (4 mg total) by mouth every 6 (six) hours.   oxyCODONE 5 MG immediate release tablet Commonly known as: Roxicodone Take 1 tablet (5 mg total) by mouth every 8 (eight) hours as needed for up to 5 days (postop pain not controlled with tylenol/ibuprofen first). What changed:  medication strength how much to take when to take this reasons to take this Another medication with the same name was removed. Continue taking this medication, and follow the directions you see here.   pantoprazole 40 MG tablet Commonly known as: PROTONIX Take 1 tablet (40 mg total) by mouth daily.   Probiotic Caps Take 1 capsule by mouth daily.   spironolactone 50 MG tablet Commonly known as: ALDACTONE Take 50 mg by mouth daily.          Follow-up Information     Andria Meuse, MD Follow up.   Specialties: General Surgery, Colon and Rectal Surgery Contact information: [address] [place] [address] [phone]         [facility], [place]. Schedule an appointment as soon as possible for a visit in 1 week(s).   Specialty: General Surgery Why: For staple removal Contact information: [address] [place] [address] [phone]                Stephanie Coup. Cliffton Asters, M.D. [facility]

## 2023-09-11 NOTE — Progress Notes (Signed)
Pt stated she is d/c home today and she has no questions or concerns. Observed pt walking in the hospital room with a RW gathering items mod Independently. Pt is safe to d/c home.

## 2023-09-26 ENCOUNTER — Encounter (HOSPITAL_COMMUNITY): Payer: Self-pay | Admitting: *Deleted

## 2023-11-12 ENCOUNTER — Emergency Department (HOSPITAL_BASED_OUTPATIENT_CLINIC_OR_DEPARTMENT_OTHER): Payer: Medicare HMO

## 2023-11-12 ENCOUNTER — Encounter (HOSPITAL_BASED_OUTPATIENT_CLINIC_OR_DEPARTMENT_OTHER): Payer: Self-pay | Admitting: Urology

## 2023-11-12 ENCOUNTER — Other Ambulatory Visit: Payer: Self-pay

## 2023-11-12 ENCOUNTER — Emergency Department (HOSPITAL_BASED_OUTPATIENT_CLINIC_OR_DEPARTMENT_OTHER)
Admission: EM | Admit: 2023-11-12 | Discharge: 2023-11-12 | Disposition: A | Discharge status: Home / Self Care | Payer: Medicare HMO | Attending: Emergency Medicine | Admitting: Emergency Medicine

## 2023-11-12 DIAGNOSIS — R197 Diarrhea, unspecified: Secondary | ICD-10-CM | POA: Insufficient documentation

## 2023-11-12 DIAGNOSIS — R112 Nausea with vomiting, unspecified: Secondary | ICD-10-CM | POA: Diagnosis not present

## 2023-11-12 DIAGNOSIS — R1013 Epigastric pain: Secondary | ICD-10-CM | POA: Insufficient documentation

## 2023-11-12 LAB — COMPREHENSIVE METABOLIC PANEL WITH GFR
ALT: 15 U/L (ref 0–44)
AST: 18 U/L (ref 15–41)
Albumin: 3.8 g/dL (ref 3.5–5.0)
Alkaline Phosphatase: 71 U/L (ref 38–126)
Anion gap: 9 (ref 5–15)
BUN: 15 mg/dL (ref 8–23)
CO2: 24 mmol/L (ref 22–32)
Calcium: 9.2 mg/dL (ref 8.9–10.3)
Chloride: 104 mmol/L (ref 98–111)
Creatinine, Ser: 1.04 mg/dL — ABNORMAL HIGH (ref 0.44–1.00)
GFR, Estimated: 59 mL/min — ABNORMAL LOW
Glucose, Bld: 85 mg/dL (ref 70–99)
Potassium: 3.8 mmol/L (ref 3.5–5.1)
Sodium: 137 mmol/L (ref 135–145)
Total Bilirubin: 0.9 mg/dL
Total Protein: 7.4 g/dL (ref 6.5–8.1)

## 2023-11-12 LAB — URINALYSIS, ROUTINE W REFLEX MICROSCOPIC
Bilirubin Urine: NEGATIVE
Glucose, UA: NEGATIVE mg/dL
Hgb urine dipstick: NEGATIVE
Ketones, ur: NEGATIVE mg/dL
Nitrite: NEGATIVE
Protein, ur: NEGATIVE mg/dL
Specific Gravity, Urine: 1.01 (ref 1.005–1.030)
pH: 6 (ref 5.0–8.0)

## 2023-11-12 LAB — CBC
HCT: 41 % (ref 36.0–46.0)
Hemoglobin: 13.3 g/dL (ref 12.0–15.0)
MCH: 29.4 pg (ref 26.0–34.0)
MCHC: 32.4 g/dL (ref 30.0–36.0)
MCV: 90.7 fL (ref 80.0–100.0)
Platelets: 208 10*3/uL (ref 150–400)
RBC: 4.52 MIL/uL (ref 3.87–5.11)
RDW: 13.6 % (ref 11.5–15.5)
WBC: 4.8 10*3/uL (ref 4.0–10.5)
nRBC: 0 % (ref 0.0–0.2)

## 2023-11-12 LAB — URINALYSIS, MICROSCOPIC (REFLEX)

## 2023-11-12 LAB — LIPASE, BLOOD: Lipase: 32 U/L (ref 11–51)

## 2023-11-12 MED ORDER — IOHEXOL 300 MG/ML  SOLN
100.0000 mL | Freq: Once | INTRAMUSCULAR | Status: AC | PRN
  Administered 2023-11-12: 100 mL via INTRAVENOUS

## 2023-11-12 MED ORDER — MAALOX MAX 400-400-40 MG/5ML PO SUSP: 5.0000 mL | Freq: Four times a day (QID) | ORAL | 0 refills | Status: AC | PRN

## 2023-11-12 MED ORDER — LIDOCAINE VISCOUS HCL 2 % MT SOLN
15.0000 mL | Freq: Once | OROMUCOSAL | Status: AC
  Administered 2023-11-12: 15 mL via ORAL
  Filled 2023-11-12: qty 15

## 2023-11-12 MED ORDER — LIDOCAINE VISCOUS HCL 2 % MT SOLN
15.0000 mL | OROMUCOSAL | 0 refills | Status: AC | PRN
Start: 2023-11-12 — End: ?

## 2023-11-12 MED ORDER — ALUM & MAG HYDROXIDE-SIMETH 200-200-20 MG/5ML PO SUSP
30.0000 mL | Freq: Once | ORAL | Status: AC
  Administered 2023-11-12: 30 mL via ORAL
  Filled 2023-11-12: qty 30

## 2023-11-12 NOTE — Discharge Instructions (Addendum)
It was a pleasure taking care of you here in the emergency department.  You do have a small hiatal hernia.  We have given started you on medication to help with some of your pain.  Please make sure to call Dr. Lucilla Lame office tomorrow.  They will be able to read the CT scan that was performed here.  It appears per their note they wanted to see you on Friday for a visit with Dr. Cliffton Asters  Return for any new or worsening symptoms.

## 2023-11-12 NOTE — ED Notes (Signed)
Family at bedside. 

## 2023-11-12 NOTE — ED Notes (Signed)
Patient transported to CT 

## 2023-11-12 NOTE — ED Notes (Signed)
Fall risk armband Fall risk sign on door Patient wearing shoes

## 2023-11-12 NOTE — ED Triage Notes (Signed)
Pt states having abd and stomach pain, N/V that started Thursday  Also reports diarrhea  Had stoma reversal 9/12, states pain and swelling at site  Surgeon concerned for hernia or blockage and sent here for scan

## 2023-11-12 NOTE — ED Provider Notes (Signed)
Care assumed from previous provider.  See note for full HPI.  In summation, 67 year old with history of recent colostomy takedown I suspect diverticulitis, GERD here for evaluation of abdominal pain.  Had vomiting and diarrhea that improved on Saturday.  Has not had bowel movement since.  She has had some intermittent nausea with burning sensation in her throat.  She took Tums without relief.  Similar reversal surgery on 09/05/2023.  She called her surgeon who was concerned about obstruction and recommended coming here to the emergency department.  Her pain is currently controlled.  Plan follow-up on CT scan Physical Exam  BP 123/83   Pulse (!) 50   Temp 97.7 F (36.5 C)   Resp 13   Ht 5\' 9"  (1.753 m)   Wt 109.3 kg   SpO2 97%   BMI 35.59 kg/m   Physical Exam Vitals and nursing note reviewed.  Constitutional:      General: She is not in acute distress.    Appearance: She is well-developed. She is not ill-appearing.  HENT:     Head: Atraumatic.  Eyes:     Pupils: Pupils are equal, round, and reactive to light.  Cardiovascular:     Rate and Rhythm: Normal rate.  Pulmonary:     Effort: No respiratory distress.  Abdominal:     General: Bowel sounds are normal. There is no distension.     Palpations: Abdomen is soft.     Tenderness: There is no abdominal tenderness.     Comments: Incision C/D/I  Musculoskeletal:        General: Normal range of motion.     Cervical back: Normal range of motion.  Skin:    General: Skin is warm and dry.  Neurological:     General: No focal deficit present.     Mental Status: She is alert.  Psychiatric:        Mood and Affect: Mood normal.     Procedures  Procedures Labs Reviewed  COMPREHENSIVE METABOLIC PANEL - Abnormal; Notable for the following components:      Result Value   Creatinine, Ser 1.04 (*)    GFR, Estimated 59 (*)    All other components within normal limits  URINALYSIS, ROUTINE W REFLEX MICROSCOPIC - Abnormal; Notable for  the following components:   Leukocytes,Ua SMALL (*)    All other components within normal limits  URINALYSIS, MICROSCOPIC (REFLEX) - Abnormal; Notable for the following components:   Bacteria, UA RARE (*)    Non Squamous Epithelial PRESENT (*)    All other components within normal limits  LIPASE, BLOOD  CBC   CT ABDOMEN PELVIS W CONTRAST  Result Date: 11/12/2023 CLINICAL DATA:  Acute abdominal pain.  Nausea vomiting. EXAM: CT ABDOMEN AND PELVIS WITH CONTRAST TECHNIQUE: Multidetector CT imaging of the abdomen and pelvis was performed using the standard protocol following bolus administration of intravenous contrast. RADIATION DOSE REDUCTION: This exam was performed according to the departmental dose-optimization program which includes automated exposure control, adjustment of the mA and/or kV according to patient size and/or use of iterative reconstruction technique. CONTRAST:  OMNIPAQUE IOHEXOL 300 MG/ML  SOLN COMPARISON:  04/10/2023 FINDINGS: Lower chest: Small to moderate hiatal hernia likely containing a fundoplication wrap. Hepatobiliary: Unremarkable Pancreas: Unremarkable Spleen: Unremarkable Adrenals/Urinary Tract: Both adrenal glands appear normal. Bilateral benign-appearing renal cysts similar to prior. No further imaging workup of these lesions is indicated. Urinary bladder unremarkable. Stomach/Bowel: Prior sleeve gastrectomy. Mildly mobile cecum near the midline. Formed stool in the  rectum. Anastomotic rectosigmoid staple line noted from partial colectomy. No dilated bowel. There are a few scattered colonic diverticula which do not appear inflamed. Vascular/Lymphatic: Mild atheromatous vascular calcifications of the abdominal aorta. Reproductive: Uterus absent.  Adnexa unremarkable. Other: No supplemental non-categorized findings. Musculoskeletal: Subcutaneous scarring along the site of prior left ostomy, without specific complicating feature observed. Mild thoracic and lumbar  spondylosis. IMPRESSION: 1. A specific cause for the patient's acute abdominal pain is not identified. 2. Small to moderate hiatal hernia likely containing a fundoplication wrap. 3. Prior sleeve gastrectomy. 4. Formed stool in the rectum. 5. Mild atheromatous vascular calcifications of the abdominal aorta. 6. Mild thoracic and lumbar spondylosis. Aortic Atherosclerosis (ICD10-I70.0). Electronically Signed   By: Gaylyn Rong M.D.   On: 11/12/2023 19:24    ED Course / MDM   Clinical Course as of 11/12/23 2000  Tue Nov 12, 2023  1902 FU on CT. Feels better after Gi cocktail. Surgery conc for obs vs hernia. Site looks good [BH]    Clinical Course User Index [BH] Lakyra Tippins A, PA-C   Care assumed from previous provider.  See note for full HPI.  In summation, 67 year old with history of recent colostomy takedown I suspect diverticulitis, GERD here for evaluation of abdominal pain.  Had vomiting and diarrhea that improved on Saturday.  Has not had bowel movement since.  She has had some intermittent nausea with burning sensation in her throat.  She took Tums without relief.  Similar reversal surgery on 09/05/2023.  She called her surgeon who was concerned about obstruction and recommended coming here to the emergency department.  Her pain is currently controlled.  Plan follow-up on CT scan  Unfortunately significant delay due to radiology reading times.  Patient and family visibly upset due to extended wait times.  Attempted to de-escalate the situation which I was able to.  I apologized for the delay.  [facility] radiology they stated patient was at the top bolus to have her CT scan read.   Labs and imaging personally viewed and interpreted:  CT scan with small hiatal hernia with fundoplication loop No acute abnormality  Discussed with attending, Dr. Doran Durand.  I discussed results with patient in room.  She is tolerating p.o. intake.  I discussed close follow-up with general  surgery, return for any worsening symptoms.  At this time I have low suspicion for acute ACS, PE, dissection, pneumothorax, unstable angina, obstruction, perforation, incarcerated/angulated hernia, perforation, bacterial infectious process, ischemic event.  Patient is nontoxic, nonseptic appearing, in no apparent distress.  Patient's pain and other symptoms adequately managed in emergency department.  Fluid bolus given.  Labs, imaging and vitals reviewed.  Patient does not meet the SIRS or Sepsis criteria.  On repeat exam patient does not have a surgical abdomin and there are no peritoneal signs.  No indication of appendicitis, bowel obstruction, bowel perforation, cholecystitis, diverticulitis.  Patient discharged home with symptomatic treatment and given strict instructions for follow-up with their primary care physician.  I have also discussed reasons to return immediately to the ER.  Patient expresses understanding and agrees with plan.    Medical Decision Making Amount and/or Complexity of Data Reviewed Independent Historian:     Details: Daughter in room External Data Reviewed: labs, radiology, ECG and notes. Labs: ordered. Decision-making details documented in ED Course. Radiology: ordered and independent interpretation performed. Decision-making details documented in ED Course.  Risk OTC drugs. Prescription drug management. Parenteral controlled substances. Decision regarding hospitalization. Diagnosis or treatment significantly limited  by social determinants of health.      Evyn Putzier A, PA-C 11/12/23 Karena Addison, MD 11/12/23 2122

## 2023-11-12 NOTE — ED Provider Notes (Signed)
[facility] Provider Note   CSN: [id number] Arrival date & time: 11/12/23  1304     History  Chief Complaint  Patient presents with   Abdominal Pain    Stoma Reversal     Kristin Gibson is a 67 y.o. female past medical history significant for GERD, colostomy takedown, sigmoid diverticulitis presents today for abdominal pain, nausea, diarrhea and vomiting that started Thursday.  Patient states the vomiting and diarrhea resolved Saturday.  She has not had a bowel movement since but has been able to pass flatus.  Patient endorses intermittent nausea now with burning sensation in throat.  Patient has tried taking her pantoprazole and Tums without relief.  Patient endorses some weakness.  Patient had stoma reversal 9/12, she saw her surgeon today who was concerned for hernia or blockage and was sent here for scan.  Patient denies blood in stool, shortness of breath, urinary symptoms, or chest pain.   Abdominal Pain Associated symptoms: diarrhea, nausea and vomiting        Home Medications Prior to Admission medications   Medication Sig Start Date End Date Taking? Authorizing Provider  acetaminophen (TYLENOL) 500 MG tablet Take 1,000 mg by mouth daily as needed for mild pain. pain     [provider]  amLODipine (NORVASC) 5 MG tablet Take 1 tablet (5 mg total) by mouth daily. 09/11/23   Andria Meuse, MD  diclofenac Sodium (VOLTAREN) 1 % GEL Apply 2 g topically daily as needed (pain).    [provider]  dicyclomine (BENTYL) 20 MG tablet Take 20 mg by mouth daily as needed for spasms. 12/05/16   [provider]  fluconazole (DIFLUCAN) 150 MG tablet Take 1 tablet (150 mg total) by mouth daily as needed (yeast infection). Patient not taking: Reported on 08/19/2023 09/20/22   Franne Forts, PA-C  methocarbamol (ROBAXIN-750) 750 MG tablet Take 1 tablet (750 mg total) by mouth every 8 (eight) hours as needed  for muscle spasms. 09/11/23   Andria Meuse, MD  Multiple Vitamins-Minerals (BARIATRIC FUSION) CHEW Chew 1 tablet by mouth daily.    [provider]  NON FORMULARY Pt uses a cpap nightly    [provider]  nystatin (MYCOSTATIN) 100000 UNIT/ML suspension Take 5 mLs (500,000 Units total) by mouth 4 (four) times daily as needed (mouth pain). Patient not taking: Reported on 07/04/2023 09/20/22   Carlena Bjornstad A, PA-C  ondansetron (ZOFRAN) 4 MG tablet Take 1 tablet (4 mg total) by mouth every 6 (six) hours. 04/10/23   Glyn Ade, MD  ondansetron (ZOFRAN-ODT) 4 MG disintegrating tablet Take 1 tablet (4 mg total) by mouth every 6 (six) hours as needed for nausea or vomiting. Patient not taking: Reported on 08/19/2023 02/20/22   Berna Bue, MD  pantoprazole (PROTONIX) 40 MG tablet Take 1 tablet (40 mg total) by mouth daily. 02/20/22   Berna Bue, MD  Probiotic CAPS Take 1 capsule by mouth daily. Patient not taking: Reported on 08/19/2023 06/28/20   Ward, Layla Maw, DO  spironolactone (ALDACTONE) 50 MG tablet Take 50 mg by mouth daily. 04/20/21   [provider]      Allergies    Benazepril, Other, Sulfa antibiotics, and Elemental sulfur    Review of Systems   Review of Systems  Gastrointestinal:  Positive for abdominal pain, diarrhea, nausea and vomiting.  Neurological:  Positive for weakness.    Physical Exam Updated Vital Signs BP 125/82 (BP Location:  Left Arm)   Pulse (!) 56   Temp 97.7 F (36.5 C)   Resp 20   Ht 5\' 9"  (1.753 m)   Wt 109.3 kg   SpO2 100%   BMI 35.59 kg/m  Physical Exam Vitals and nursing note reviewed.  Constitutional:      General: She is not in acute distress.    Appearance: She is well-developed.  HENT:     Head: Normocephalic and atraumatic.     Mouth/Throat:     Mouth: Mucous membranes are moist.     Pharynx: Oropharynx is clear.  Eyes:     Extraocular Movements: Extraocular movements intact.      Conjunctiva/sclera: Conjunctivae normal.  Cardiovascular:     Rate and Rhythm: Regular rhythm. Bradycardia present.     Heart sounds: No murmur heard.    Comments: Patient states she is normally bradycardic Pulmonary:     Effort: Pulmonary effort is normal. No respiratory distress.     Breath sounds: Normal breath sounds. Stridor present.  Abdominal:     General: Abdomen is flat. A surgical scar is present. Bowel sounds are normal.     Palpations: Abdomen is soft.     Tenderness: There is abdominal tenderness in the left lower quadrant. There is no right CVA tenderness, left CVA tenderness or guarding. Negative signs include Murphy's sign and McBurney's sign.     Comments: Colostomy and midline surgical scar noted without redness or discharge.  Musculoskeletal:        General: No swelling.     Cervical back: Neck supple.  Skin:    General: Skin is warm and dry.     Capillary Refill: Capillary refill takes less than 2 seconds.  Neurological:     General: No focal deficit present.     Mental Status: She is alert.     Motor: No weakness.  Psychiatric:        Mood and Affect: Mood normal.    ED Results / Procedures / Treatments   Labs (all labs ordered are listed, but only abnormal results are displayed) Labs Reviewed  COMPREHENSIVE METABOLIC PANEL - Abnormal; Notable for the following components:      Result Value   Creatinine, Ser 1.04 (*)    GFR, Estimated 59 (*)    All other components within normal limits  URINALYSIS, ROUTINE W REFLEX MICROSCOPIC - Abnormal; Notable for the following components:   Leukocytes,Ua SMALL (*)    All other components within normal limits  URINALYSIS, MICROSCOPIC (REFLEX) - Abnormal; Notable for the following components:   Bacteria, UA RARE (*)    Non Squamous Epithelial PRESENT (*)    All other components within normal limits  LIPASE, BLOOD  CBC    EKG None  Radiology No results found.  Procedures Procedures    Medications Ordered  in ED Medications  iohexol (OMNIPAQUE) 300 MG/ML solution 100 mL (100 mLs Intravenous Contrast Given 11/12/23 1422)    ED Course/ Medical Decision Making/ A&P                                 Medical Decision Making Amount and/or Complexity of Data Reviewed Labs: ordered. Radiology: ordered.  Risk Prescription drug management.   This patient presents to the ED with chief complaint(s) of nausea, vomiting, diarrhea, abdominal pain with pertinent past medical history of colostomy reversal which further complicates the presenting complaint. The complaint involves an extensive differential diagnosis  and also carries with it a high risk of complications and morbidity.    The differential diagnosis includes gastroenteritis, hernia, bowel obstruction  Additional history obtained: Additional history obtained from family Records reviewed gastroenterology progress notes  ED Course and Reassessment: Patient given Maalox and viscous lidocaine for GERD  Independent labs interpretation:  The following labs were independently interpreted:  CBC: No notable findings CMP: Mildly elevated creatinine 1.04, mildly decreased GFR of 59 Lipase: 32 UA: Small leuks, rare bacteria, non-squamous epithelial present, 6-10 WBCs EKG: Sinus bradycardia  Independent visualization of imaging: - I independently visualized the following imaging with scope of interpretation limited to determining acute life threatening conditions related to emergency care: CT abdomen pelvis with contrast, which revealed pending  Consultation: - Consulted or discussed management/test interpretation w/ external professional: None  Consideration for admission or further workup: Signed out to [employer], PA-C at shift change pending CT abdomen pelvis with contrast radiology read.  If this does not have any findings patient will likely be discharged home.        Final Clinical Impression(s) / ED Diagnoses Final  diagnoses:  None    Rx / DC Orders ED Discharge Orders     None         Gretta Began 11/12/23 1857    Glyn Ade, MD 11/12/23 2122

## 2023-12-09 ENCOUNTER — Other Ambulatory Visit: Payer: Self-pay | Admitting: Surgery

## 2023-12-09 DIAGNOSIS — K449 Diaphragmatic hernia without obstruction or gangrene: Secondary | ICD-10-CM

## 2023-12-09 DIAGNOSIS — Z9884 Bariatric surgery status: Secondary | ICD-10-CM

## 2024-01-22 ENCOUNTER — Other Ambulatory Visit (HOSPITAL_COMMUNITY): Payer: Self-pay

## 2024-01-27 ENCOUNTER — Other Ambulatory Visit: Payer: Self-pay | Admitting: Surgery

## 2024-01-27 DIAGNOSIS — Z9884 Bariatric surgery status: Secondary | ICD-10-CM

## 2024-01-27 DIAGNOSIS — K219 Gastro-esophageal reflux disease without esophagitis: Secondary | ICD-10-CM

## 2024-02-18 ENCOUNTER — Ambulatory Visit
Admission: RE | Admit: 2024-02-18 | Discharge: 2024-02-18 | Disposition: A | Discharge status: Home / Self Care | Payer: Medicare HMO | Source: Ambulatory Visit | Attending: Surgery | Admitting: Surgery

## 2024-02-18 ENCOUNTER — Other Ambulatory Visit: Payer: Self-pay | Admitting: Surgery

## 2024-02-18 DIAGNOSIS — Z9884 Bariatric surgery status: Secondary | ICD-10-CM

## 2024-02-18 DIAGNOSIS — K449 Diaphragmatic hernia without obstruction or gangrene: Secondary | ICD-10-CM

## 2024-03-11 ENCOUNTER — Ambulatory Visit (INDEPENDENT_AMBULATORY_CARE_PROVIDER_SITE_OTHER): Payer: Medicare HMO | Admitting: Neurology

## 2024-03-11 ENCOUNTER — Encounter: Payer: Self-pay | Admitting: Neurology

## 2024-03-11 VITALS — BP 133/84 | HR 78 | Ht 69.0 in | Wt 261.0 lb

## 2024-03-11 DIAGNOSIS — G629 Polyneuropathy, unspecified: Secondary | ICD-10-CM | POA: Diagnosis not present

## 2024-03-11 DIAGNOSIS — R208 Other disturbances of skin sensation: Secondary | ICD-10-CM

## 2024-03-11 DIAGNOSIS — G5603 Carpal tunnel syndrome, bilateral upper limbs: Secondary | ICD-10-CM

## 2024-03-11 MED ORDER — GABAPENTIN 300 MG PO CAPS: 300.0000 mg | ORAL_CAPSULE | Freq: Three times a day (TID) | ORAL | 11 refills | Status: DC

## 2024-03-11 NOTE — Progress Notes (Signed)
 [facility]  PATIENT: Kristin Gibson DOB: 11-05-1956  REFERRING DOCTOR OR PCP:  Floy Sabina, NP SOURCE: patient, notes from PCP  _________________________________   HISTORICAL  CHIEF COMPLAINT:  Chief Complaint  Patient presents with   New Patient (Initial Visit)    Pt in 11 alone Pt states numbness,tingling in both feet,toes and hands    HISTORY OF PRESENT ILLNESS:  I had the pleasure of seeing your patient, Kristin Gibson, at [facility] for neurologic consultation regarding her dysesthesias.  She is a 68 yo woman who began to note dysesthesias in her hands and feet around September 2023.  That month, she had abdominal surgery.   She had blood clots requiring Eliquis.   The numbness and tingling in the hands and feet started around that time.   She notes that the hand numbness bothers her more at night.  Often, shaking the hands or certain movements will improve the pain   She notes reduced sensation holding a hot item.  The intensity of the pain and quality of the pain is similar between the 2 hands.  She does not note much weakness..    The quality of the pain is an intense tingling.  In the feet, the numbness is up to the heels.  Sensation is a pins and needles quality in the feet.  She denies any weakness.    She denies change in bladder function..  She had been on gabapentin for a while and it helped some.   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  She required partial colectomy 08/31/2022 and the colostomy was reversed 09/05/2023.  She has lost 80 pounds.     LABS 11/19/2023.  Vit D mildly low at 28.7; B1 and B12 were normal, creatinine 1.07 (GFR 57) 08/06/2023  HgbA1c was 6.0 (>6.0 since 2021)  REVIEW OF SYSTEMS: Constitutional: No fevers, chills, sweats, or change in appetite Eyes: No visual changes, double vision, eye pain Ear, nose and throat: No hearing loss, ear pain, nasal  congestion, sore throat Cardiovascular: No chest pain, palpitations Respiratory:  No shortness of breath at rest or with exertion.   No wheezes GastrointestinaI: No nausea, vomiting, diarrhea, abdominal pain, fecal incontinence Genitourinary:  No dysuria, urinary retention or frequency.  No nocturia. Musculoskeletal:  No neck pain, back pain Integumentary: No rash, pruritus, skin lesions Neurological: as above Psychiatric: No depression at this time.  No anxiety Endocrine: No palpitations, diaphoresis, change in appetite, change in weigh or increased thirst Hematologic/Lymphatic:  No anemia, purpura, petechiae. Allergic/Immunologic: No itchy/runny eyes, nasal congestion, recent allergic reactions, rashes  ALLERGIES: Allergies  Allergen Reactions   Benazepril Swelling   Other Nausea And Vomiting    Post anesthesia   Sulfa Antibiotics Itching and Nausea Only   Elemental Sulfur Itching and Nausea Only    HOME MEDICATIONS:  Current Outpatient Medications:    acetaminophen (TYLENOL) 500 MG tablet, Take 1,000 mg by mouth daily as needed for mild pain. pain , Disp: , Rfl:    alum & mag hydroxide-simeth (MAALOX MAX) 400-400-40 MG/5ML suspension, Take 5 mLs by mouth every 6 (six) hours as needed for indigestion., Disp: 355 mL, Rfl: 0   amLODipine (NORVASC) 5 MG tablet, Take 1 tablet (5 mg total) by mouth daily., Disp: 30 tablet, Rfl: 1   diclofenac Sodium (VOLTAREN) 1 % GEL, Apply 2 g topically daily as needed (pain)., Disp: , Rfl:    dicyclomine (BENTYL) 20 MG tablet, Take  20 mg by mouth daily as needed for spasms., Disp: , Rfl:    fluconazole (DIFLUCAN) 150 MG tablet, Take 1 tablet (150 mg total) by mouth daily as needed (yeast infection)., Disp: 3 tablet, Rfl: 0   lidocaine (XYLOCAINE) 2 % solution, Use as directed 15 mLs in the mouth or throat as needed for mouth pain., Disp: 100 mL, Rfl: 0   methocarbamol (ROBAXIN-750) 750 MG tablet, Take 1 tablet (750 mg total) by mouth every 8 (eight)  hours as needed for muscle spasms., Disp: 30 tablet, Rfl: 0   Multiple Vitamins-Minerals (BARIATRIC FUSION) CHEW, Chew 1 tablet by mouth daily., Disp: , Rfl:    NON FORMULARY, Pt uses a cpap nightly, Disp: , Rfl:    nystatin (MYCOSTATIN) 100000 UNIT/ML suspension, Take 5 mLs (500,000 Units total) by mouth 4 (four) times daily as needed (mouth pain)., Disp: 60 mL, Rfl: 0   ondansetron (ZOFRAN) 4 MG tablet, Take 1 tablet (4 mg total) by mouth every 6 (six) hours., Disp: 12 tablet, Rfl: 0   ondansetron (ZOFRAN-ODT) 4 MG disintegrating tablet, Take 1 tablet (4 mg total) by mouth every 6 (six) hours as needed for nausea or vomiting., Disp: 20 tablet, Rfl: 0   pantoprazole (PROTONIX) 40 MG tablet, Take 1 tablet (40 mg total) by mouth daily., Disp: 90 tablet, Rfl: 0   Probiotic CAPS, Take 1 capsule by mouth daily., Disp: 30 capsule, Rfl: 0   spironolactone (ALDACTONE) 50 MG tablet, Take 50 mg by mouth daily., Disp: , Rfl:   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   Hypertension    Knee pain, chronic    Lower back pain    PONV (postoperative nausea and vomiting)    Pre-diabetes    Sleep apnea    cpap    PAST SURGICAL HISTORY: Past Surgical History:  Procedure Laterality Date   ABDOMINAL HYSTERECTOMY     BREAST REDUCTION SURGERY     FLEXIBLE SIGMOIDOSCOPY N/A 09/05/2023   Procedure: FLEXIBLE SIGMOIDOSCOPY;  Surgeon: Andria Meuse, MD;  Location: [facility];  Service: General;  Laterality: N/A;   IR CATHETER TUBE CHANGE  09/12/2022   IR SINUS/FIST TUBE CHK-NON GI  09/18/2022   KNEE SURGERY     LAPAROSCOPIC GASTRIC SLEEVE RESECTION N/A 02/19/2022   Procedure: LAPAROSCOPIC GASTRIC SLEEVE RESECTION, HIATAL HERNIA REPAIR;  Surgeon: Berna Bue, MD;  Location: [facility];  Service: General;  Laterality: N/A;   PARTIAL COLECTOMY N/A 08/31/2022    Procedure: OPEN PARTIAL COLECTOMY WITH COLOSTOMY;  Surgeon: Darnell Level, MD;  Location: [facility];  Service: General;  Laterality: N/A;   right knee replacement      TRIGGER FINGER RELEASE Right 04/2023   thumb, [facility] Dr. Lelon Perla   TUBAL LIGATION     UPPER GI ENDOSCOPY N/A 02/19/2022   Procedure: UPPER GI ENDOSCOPY;  Surgeon: Berna Bue, MD;  Location: [facility];  Service: General;  Laterality: N/A;   XI ROBOTIC ASSISTED COLOSTOMY TAKEDOWN N/A 09/05/2023   Procedure: XI ROBOTIC ASSISTED COLOSTOMY REVERAL; LYSIS OF ADHESIONS, BILATERAL TAP BLOCK, REPAIR OF SMALL BOWEL X 4;  Surgeon: Andria Meuse, MD;  Location: [facility];  Service: General;  Laterality: N/A;    FAMILY HISTORY: Family History  Problem Relation Age of Onset   Hypertension Mother    Cancer Sister  Diabetes Sister    Liver disease Neg Hx    Esophageal cancer Neg Hx    Colon cancer Neg Hx    Rectal cancer Neg Hx    Stomach cancer Neg Hx    Neuropathy Neg Hx     SOCIAL HISTORY: Social History   Socioeconomic History   Marital status: Divorced    Spouse name: Not on file   Number of children: 2   Years of education: Not on file   Highest education level: Not on file  Occupational History   Occupation: retired  Tobacco Use   Smoking status: Former   Smokeless tobacco: Never  Advertising account planner   Vaping status: Never Used  Substance and Sexual Activity   Alcohol use: Yes    Comment: occasionally   Drug use: No   Sexual activity: Yes    Birth control/protection: Surgical  Other Topics Concern   Not on file  Social History Narrative   Retired    Lives alone    Social Drivers of Health   Financial Resource Strain: Low Risk  (03/10/2024)   Received from [facility]   Overall Financial Resource Strain (CARDIA)    Difficulty of Paying Living Expenses: Not very hard  Food Insecurity: No Food Insecurity (03/10/2024)   [facility] from [facility]   Hunger Vital Sign    Worried About Running Out of Food in  the Last Year: Never true    Ran Out of Food in the Last Year: Never true  Transportation Needs: Unmet Transportation Needs (03/10/2024)   Received from [facility] - Transportation    Lack of Transportation (Medical): Yes    Lack of Transportation (Non-Medical): Yes  Physical Activity: Insufficiently Active (03/10/2024)   [facility]   Exercise Vital Sign    Days of Exercise per Week: 2 days    Minutes of Exercise per Session: 20 min  Stress: No Stress Concern Present (03/10/2024)   Received from [facility] - Occupational Stress Questionnaire    Feeling of Stress : Not at all  Social Connections: Socially Integrated (03/10/2024)   Received from [facility]   Social Network    How would you rate your social network (family, work, friends)?: Good participation with social networks  Intimate Partner Violence: Not At Risk (03/10/2024)   Received from [facility]   HITS    Over the last 12 months how often did your partner physically hurt you?: Never    Over the last 12 months how often did your partner insult you or talk down to you?: Never    Over the last 12 months how often did your partner threaten you with physical harm?: Never    Over the last 12 months how often did your partner scream or curse at you?: Never       PHYSICAL EXAM  Vitals:   03/11/24 1302  BP: 133/84  Pulse: 78  Weight: 261 lb (118.4 kg)  Height: 5\' 9"  (1.753 m)    Body mass index is 38.54 kg/m.   General: The patient is well-developed and well-nourished and in no acute distress  HEENT:  Head is [place]/AT.  Sclera are anicteric.   Neck: No carotid bruits are noted.  The neck is nontender.  Cardiovascular: The heart has a regular rate and rhythm with a normal S1 and S2. There were no murmurs, gallops or rubs.    Skin: Extremities are without rash or  edema.  Musculoskeletal:  Back is nontender.  She has  platypodia  Neurologic Exam  Mental status: The patient is alert and oriented x 3 at the time of the examination. The patient has apparent normal recent and remote memory, with an apparently normal attention span and concentration ability.   Speech is normal.  Cranial nerves: Extraocular movements are full. Pupils are equal, round, and reactive to light and accomodation.   There is good facial sensation to soft touch bilaterally.Facial strength is normal.  Trapezius and sternocleidomastoid strength is normal. No dysarthria is noted.  The tongue is midline, and the patient has symmetric elevation of the soft palate. No obvious hearing deficits are noted.  Motor:  Muscle bulk is normal.   Tone is normal. Strength is  5 / 5 in all 4 extremities.   Sensory: Sensory testing is intact to pinprick, soft touch and vibration sensation in proximal hand and legs.   Mildly reduced sensation over thenar eminences of hands.   Reduced pp sensation in proximal feet, more reduced at toes.   50-60% vibration sensation at ankles and 10% right and 20% left toes.     Coordination: Cerebellar testing reveals good finger-nose-finger and heel-to-shin bilaterally.  Gait and station: Station is normal.   Gait is normal. Tandem gait is wide. . Romberg is negative.   Reflexes: Deep tendon reflexes are symmetric and normal bilaterally.   Plantar responses are flexor.    DIAGNOSTIC DATA (LABS, IMAGING, TESTING) - I reviewed patient records, labs, notes, testing and imaging myself where available.  Lab Results  Component Value Date   WBC 4.8 11/12/2023   HGB 13.3 11/12/2023   HCT 41.0 11/12/2023   MCV 90.7 11/12/2023   PLT 208 11/12/2023      Component Value Date/Time   NA 137 11/12/2023 1314   K 3.8 11/12/2023 1314   CL 104 11/12/2023 1314   CO2 24 11/12/2023 1314   GLUCOSE 85 11/12/2023 1314   BUN 15 11/12/2023 1314   CREATININE 1.04 (H) 11/12/2023 1314   CALCIUM 9.2 11/12/2023 1314   PROT 7.4 11/12/2023  1314   ALBUMIN 3.8 11/12/2023 1314   AST 18 11/12/2023 1314   ALT 15 11/12/2023 1314   ALKPHOS 71 11/12/2023 1314   BILITOT 0.9 11/12/2023 1314   GFRNONAA 59 (L) 11/12/2023 1314   GFRAA >60 06/28/2020 0548   Lab Results  Component Value Date   TRIG 56 09/17/2022   Lab Results  Component Value Date   HGBA1C 6.1 (H) 09/07/2022      ASSESSMENT AND PLAN  Dysesthesia - Plan: NCV with EMG(electromyography), Copper, serum, Multiple Myeloma Panel (SPEP&IFE w/QIG), Sjogren's syndrome antibods(ssa + ssb)  Polyneuropathy - Plan: NCV with EMG(electromyography), Copper, serum, Multiple Myeloma Panel (SPEP&IFE w/QIG), Sjogren's syndrome antibods(ssa + ssb)  Bilateral carpal tunnel syndrome - Plan: NCV with EMG(electromyography)   In summary, Kristin Gibson is a 68 year old woman who has had numbness and tingling in her feet since September 2023 and an intense numbness with pain in the hands that is more intermittent since around that time.  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  She has had bariatric surgery as well as partial colectomy and malabsorption syndrome is also possible.  We will check copper and the B12 was normal recently.  In the hands, I think she has bilateral carpal tunnel syndrome.  We discussed getting wrist splints to see if that helps the hands.  To further sort out these issues we will check an NCV/EMG study.  If she does not have carpal tunnel syndrome, then I would recommend an MRI of the cervical spine to further evaluate.  If she does have carpal tunnel and symptoms have worsened we could refer to a hand or peripheral nerve surgeon.  To help with the dysesthesias I placed her on gabapentin 300 mg p.o. 3 times daily.  She had previously been on a low-dose of gabapentin and hopefully this more standard dose will be helpful.  She will return  to see me as needed based on the results of the studies and her response to treatments  Thank you for asking me to see this patient.  Please let me know if I can be of further assistance with her or other patients in the future.  This visit is part of a comprehensive longitudinal care medical relationship regarding the patients primary diagnosis of polyneuropathy and related concerns.   Trishia Cuthrell A. Epimenio Foot, MD, [facility] 03/11/2024, 1:41 PM Certified in Neurology, Clinical Neurophysiology, Sleep Medicine and Neuroimaging  [facility] [address] [place], [place] [address] [phone]

## 2024-03-13 ENCOUNTER — Encounter: Payer: Self-pay | Admitting: Neurology

## 2024-03-13 LAB — MULTIPLE MYELOMA PANEL, SERUM
Albumin SerPl Elph-Mcnc: 3.4 g/dL (ref 2.9–4.4)
Albumin/Glob SerPl: 1 (ref 0.7–1.7)
Alpha 1: 0.2 g/dL (ref 0.0–0.4)
Alpha2 Glob SerPl Elph-Mcnc: 0.9 g/dL (ref 0.4–1.0)
B-Globulin SerPl Elph-Mcnc: 1.1 g/dL (ref 0.7–1.3)
Gamma Glob SerPl Elph-Mcnc: 1.3 g/dL (ref 0.4–1.8)
Globulin, Total: 3.5 g/dL (ref 2.2–3.9)
IgA/Immunoglobulin A, Serum: 218 mg/dL (ref 87–352)
IgG (Immunoglobin G), Serum: 1430 mg/dL (ref 586–1602)
IgM (Immunoglobulin M), Srm: 24 mg/dL — ABNORMAL LOW (ref 26–217)
Total Protein: 6.9 g/dL (ref 6.0–8.5)

## 2024-03-13 LAB — SJOGREN'S SYNDROME ANTIBODS(SSA + SSB)
ENA SSA (RO) Ab: <0.2 AI (ref 0.0–0.9)
ENA SSB (LA) Ab: <0.2 AI (ref 0.0–0.9)

## 2024-03-13 LAB — COPPER, SERUM: Copper: 111 ug/dL (ref 80–158)

## 2024-03-13 NOTE — Progress Notes (Signed)
 03/16/2024 Kristin Gibson [id number] [birth year]  Referring provider: Berna Bue, MD Primary GI doctor: Dr. Barron Alvine  ASSESSMENT AND PLAN:   Recurrent hiatal hernia and GERD Previous sleeve gastrectomy and hiatal hernia repair 2023 with Dr. Doylene Canard 02/18/2024 upper GI recurrent small sliding hiatal hernia moderate GERD with small epiphrenic pulsion diverticulum, mild dysmotility She has increase GERD with any liquid/eating, having acid in the morning, having some regurg of liquids/solids, worse with large volume food/drink, denies dysphagia - continue protonix 40 mg BID and mylanta as needed, can add on pepcid at night and carafate as needed -Will set up EGD at [facility] with Dr. Barron Alvine to evaluate for esophagitis, may need hiatal hernia repair plus or minus TIF -May need esophageal manometry versus GES pending EGD, given gastroparesis diet I discussed risks of EGD with patient today, including risk of sedation, bleeding or perforation.  Patient provides understanding and gave verbal consent to proceed.  Abdominal pain/constipation ER visit 11/12/2023 CT abdomen pelvis with contrast no specific cause of abdominal pain, small to moderate hiatal hernia containing fundoplication wrap prior sleeve gastrectomy, formed stool in the rectum On miralax as needed, will start daily  Diverticulosis with history of diverticulitis  s/p exploratory laparotomy with sigmoid colectomy and descending colostomy 08/2022 07/04/2023 colonoscopy Dr. Barron Alvine for anticipated colostomy reversal diverticulosis descending ascending colon 1 3 mm descending polyp, otherwise normal 09/05/2023 colostomy reversal with lysis of adhesions repair of small bowel Some pain in her side, will get CBC, CMET, ESR, consider CT if labs are elevated Likely from constipation/IBS ER precautions discussed  History of DVT post surgery Was on Eliquis 6 months but has stopped Not on a blood thinner No leg  swelling/pain  Morbid obesity  Body mass index is 39.37 kg/m.  -Patient has been advised to make an attempt to improve diet and exercise patterns to aid in weight loss. -Recommended diet heavy in fruits and veggies and low in animal meats, cheeses, and dairy products, appropriate calorie intake   Patient Care Team: Woodroe Chen, MD as PCP - General (Internal Medicine) Almond Lint, MD as Consulting Physician (General Surgery)  HISTORY OF PRESENT ILLNESS:  Discussed the use of AI scribe software for clinical note transcription with the patient, who gave verbal consent to proceed.  History of Present Illness   Kristin Gibson is a 68 year old female with a history of sleeve gastrectomy and hiatal hernia repair who presents with persistent epigastric pain and reflux symptoms.  She underwent a sleeve gastrectomy and hiatal hernia repair in February 2023. Since then, she has experienced persistent epigastric pain and reflux symptoms. She describes vomiting 'foamy stuff' in the mornings before eating or drinking, along with heartburn and reflux, where acid and liquids regurgitate after meals. She manages these symptoms by consuming small amounts of food and drink to avoid early satiety, which she attributes to the sleeve gastrectomy.  In September 2023, she had a colostomy reversal. Initially, she experienced diarrhea, but now she reports constipation, which she manages with Miralax once daily. Although the constipation has improved since the reversal, she still experiences mild tenderness in the left lower quadrant and worsened epigastric discomfort.  She has a history of diverticulosis and underwent partial colon resection due to severe diverticulitis. Despite the surgery, she continues to experience left lower quadrant pain, which she associates with her history of diverticulosis.  She is prediabetic and has been for several years. Her current medications include metoprolol twice  daily, Mylanta, and Miralax.  She is not currently on any blood thinners, although she was on Eliquis for a year following her first surgery.  No trouble swallowing but she feels reflux and heartburn. No current diarrhea. Mild tenderness in the left lower quadrant and worse epigastric discomfort.      She  reports that she has quit smoking. She has never used smokeless tobacco. She reports current alcohol use. She reports that she does not use drugs.  RELEVANT GI HISTORY, IMAGING AND LABS: Results   RADIOLOGY CT Abdomen: Diverticulosis in the descending and ascending colon; sigmoid portion removed (10/2023)      - Colonoscopy in [place] approximately 2011 and no polyps per patient.  Otherwise adopted and no known family history. - 11/25/2016: ER evaluation for abdominal pain.  WBC 7.6.  CT with diverticulosis without diverticulitis.  Was treated with antibiotics and discharged home - 12/05/2016: Evaluated at [facility] for evaluation of abdominal pain with recurrent diverticulitis and constipation.  Improvement in pain with antibiotics from the ER.  Treats constipation with stool softener. - 03/07/2017: Colonoscopy: Sigmoid diverticulosis, otherwise normal including normal TI.  Repeat in 10 years - 02/19/2022: Sleeve gastrectomy, HH repair by Dr. Fredricka Bonine - 08/15/2022: Follow-up appointment at [facility].  Per notes, history of recurrent diverticulitis and had apparently recommended sigmoid colectomy in 07/2020, but never completed.  Constipation managed with stool softeners.  Planning for gastric sleeve for weight loss.  Apparently had CT done prior to that appointment and diagnosed with recurrent diverticulitis again and treated with Cipro/Flagyl, but ongoing symptoms at that follow-up appointment prompting repeat CT. - 08/16/2022: CT A/P with IV contrast: Wall thickening of the sigmoid colon which may relate to residual or recurrent diverticulitis.  Recommend follow-up colonoscopy  to exclude any underlying neoplastic process.  Postsurgical changes in the stomach.  Nonspecific distal esophageal thickening.  Treated with Augmentin. - 08/31/2022: Ex lap, sigmoid colectomy, descending colostomy, VAC dressing placement.  Path showing diverticulosis, diverticulitis, and micro abscesses with viable margins. - 02/05/2023: Follow-up appointment with Dr. Cliffton Asters in Colorectal Surgery.  Doing well with her colostomy with occasional constipation treated with MiraLAX.  No output from rectum.  Referred to [facility] for repeat colonoscopy prior to undergoing colostomy reversal surgery.  Also planning Gastrografin enema via anus - 02/25/2023: Unsuccessful attempted barium enema due to inability to advance either a soft Foley catheter or regular rectal tube into rectum. - 04/08/2023: Follow-up with Dr. Cliffton Asters.  DRE with no palpable mass.  Referred for colonoscopy preoperatively. - 04/10/2023: CT A/P: Postsurgical changes of sigmoid colectomy and left-sided end colostomy.  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  No evidence of bowel obstruction, diverticulitis.  CBC    Component Value Date/Time   WBC 4.8 11/12/2023 1314   RBC 4.52 11/12/2023 1314   HGB 13.3 11/12/2023 1314   HCT 41.0 11/12/2023 1314   PLT 208 11/12/2023 1314   MCV 90.7 11/12/2023 1314   MCH 29.4 11/12/2023 1314   MCHC 32.4 11/12/2023 1314   RDW 13.6 11/12/2023 1314   LYMPHSABS 1.2 09/11/2023 0459   MONOABS 0.5 09/11/2023 0459   EOSABS 0.1 09/11/2023 0459   BASOSABS 0.0 09/11/2023 0459   Recent Labs    04/10/23 1217 08/27/23 1134 09/06/23 0446 09/07/23 0519 09/08/23 0422 09/10/23 0826 09/11/23 0459 11/12/23 1314  HGB 12.9 13.1 13.0 12.2 11.6* 12.7 12.1 13.3    CMP     Component Value Date/Time   NA 137 11/12/2023 1314   K 3.8 11/12/2023 1314  CL 104 11/12/2023 1314   CO2 24 11/12/2023 1314   GLUCOSE 85 11/12/2023 1314   BUN 15 11/12/2023 1314   CREATININE 1.04 (H) 11/12/2023 1314   CALCIUM 9.2  11/12/2023 1314   PROT 6.9 03/11/2024 1351   ALBUMIN 3.8 11/12/2023 1314   AST 18 11/12/2023 1314   ALT 15 11/12/2023 1314   ALKPHOS 71 11/12/2023 1314   BILITOT 0.9 11/12/2023 1314   GFRNONAA 59 (L) 11/12/2023 1314   GFRAA >60 06/28/2020 0548      Latest Ref Rng & Units 03/11/2024    1:51 PM 11/12/2023    1:14 PM 08/27/2023   11:34 AM  Hepatic Function  Total Protein 6.0 - 8.5 g/dL 6.9  7.4  7.5   Albumin 3.5 - 5.0 g/dL  3.8  3.9   AST 15 - 41 U/L  18  20   ALT 0 - 44 U/L  15  15   Alk Phosphatase 38 - 126 U/L  71  76   Total Bilirubin <1.2 mg/dL  0.9  0.6       Current Medications:    Current Outpatient Medications (Cardiovascular):    spironolactone (ALDACTONE) 50 MG tablet, Take 50 mg by mouth daily.   Current Outpatient Medications (Analgesics):    acetaminophen (TYLENOL) 500 MG tablet, Take 1,000 mg by mouth daily as needed for mild pain. pain    Current Outpatient Medications (Other):    alum & mag hydroxide-simeth (MAALOX MAX) 400-400-40 MG/5ML suspension, Take 5 mLs by mouth every 6 (six) hours as needed for indigestion.   cyclobenzaprine (FLEXERIL) 5 MG tablet, Take 5 mg by mouth at bedtime.   diclofenac Sodium (VOLTAREN) 1 % GEL, Apply 2 g topically daily as needed (pain).   dicyclomine (BENTYL) 20 MG tablet, Take 20 mg by mouth daily as needed for spasms.   famotidine (PEPCID) 40 MG tablet, Take 1 tablet (40 mg total) by mouth at bedtime.   fluconazole (DIFLUCAN) 150 MG tablet, Take 1 tablet (150 mg total) by mouth daily as needed (yeast infection).   gabapentin (NEURONTIN) 300 MG capsule, Take 1 capsule (300 mg total) by mouth 3 (three) times daily.   lidocaine (XYLOCAINE) 2 % solution, Use as directed 15 mLs in the mouth or throat as needed for mouth pain.   methocarbamol (ROBAXIN-750) 750 MG tablet, Take 1 tablet (750 mg total) by mouth every 8 (eight) hours as needed for muscle spasms.   Multiple Vitamins-Minerals (BARIATRIC FUSION) CHEW, Chew 1 tablet by  mouth daily.   NON FORMULARY, Pt uses a cpap nightly   nystatin (MYCOSTATIN) 100000 UNIT/ML suspension, Take 5 mLs (500,000 Units total) by mouth 4 (four) times daily as needed (mouth pain).   ondansetron (ZOFRAN) 4 MG tablet, Take 1 tablet (4 mg total) by mouth every 6 (six) hours.   ondansetron (ZOFRAN-ODT) 4 MG disintegrating tablet, Take 1 tablet (4 mg total) by mouth every 6 (six) hours as needed for nausea or vomiting.   pantoprazole (PROTONIX) 40 MG tablet, Take 40 mg by mouth 2 (two) times daily.   Probiotic CAPS, Take 1 capsule by mouth daily.   sucralfate (CARAFATE) 1 g tablet, Take 1 tablet (1 g total) by mouth 4 (four) times daily -  with meals and at bedtime.  Medical History:  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  Hypertension    Knee pain, chronic    Lower back pain    PONV (postoperative nausea and vomiting)    Pre-diabetes    Sleep apnea    cpap   Allergies:  Allergies  Allergen Reactions   Benazepril Swelling   Other Nausea And Vomiting    Post anesthesia   Sulfa Antibiotics Itching and Nausea Only   Elemental Sulfur Itching and Nausea Only     Surgical History:  She  has a past surgical history that includes Tubal ligation; Abdominal hysterectomy; Knee surgery; Breast reduction surgery; right knee replacement ; Laparoscopic gastric sleeve resection (N/A, 02/19/2022); Upper gi endoscopy (N/A, 02/19/2022); Partial colectomy (N/A, 08/31/2022); IR Catheter Tube Change (09/12/2022); IR Sinus/Fist Tube Chk-Non GI (09/18/2022); Trigger finger release (Right, 04/2023); Xi robotic assisted colostomy takedown (N/A, 09/05/2023); and Flexible sigmoidoscopy (N/A, 09/05/2023). Family History:  Her family history includes Cancer in her sister; Diabetes in her sister; Hypertension in her mother.  REVIEW OF SYSTEMS  : All other systems reviewed and  negative except where noted in the History of Present Illness.  PHYSICAL EXAM: BP 128/82 (BP Location: Left Arm, Patient Position: Sitting, Cuff Size: Normal)   Pulse 71   Ht 5\' 9"  (1.753 m)   Wt 266 lb 9.6 oz (120.9 kg)   BMI 39.37 kg/m  Physical Exam   GENERAL APPEARANCE: Well nourished, in no apparent distress. HEENT: No cervical lymphadenopathy, unremarkable thyroid, sclerae anicteric, conjunctiva pink. RESPIRATORY: Respiratory effort normal, breath sounds clear to auscultation bilaterally without rales, rhonchi, or wheezing. CARDIO: Regular rate and rhythm with no murmurs, rubs, or gallops, peripheral pulses intact. ABDOMEN: Soft, non-distended, decreased bowel sounds, mild tenderness in left lower quadrant, worse tenderness in epigastric region, no rebound, no mass appreciated. RECTAL: Declines. MUSCULOSKELETAL: Full range of motion, normal gait, without edema. SKIN: Dry, intact without rashes or lesions. No jaundice. NEURO: Alert, oriented, no focal deficits. PSYCH: Cooperative, normal mood and affect.      Doree Albee, PA-C 11:13 AM

## 2024-03-16 ENCOUNTER — Other Ambulatory Visit (INDEPENDENT_AMBULATORY_CARE_PROVIDER_SITE_OTHER)

## 2024-03-16 ENCOUNTER — Ambulatory Visit (INDEPENDENT_AMBULATORY_CARE_PROVIDER_SITE_OTHER): Payer: Medicare HMO | Admitting: Physician Assistant

## 2024-03-16 ENCOUNTER — Encounter: Payer: Self-pay | Admitting: Physician Assistant

## 2024-03-16 VITALS — BP 128/82 | HR 71 | Ht 69.0 in | Wt 266.6 lb

## 2024-03-16 DIAGNOSIS — K449 Diaphragmatic hernia without obstruction or gangrene: Secondary | ICD-10-CM

## 2024-03-16 DIAGNOSIS — R1032 Left lower quadrant pain: Secondary | ICD-10-CM

## 2024-03-16 DIAGNOSIS — K219 Gastro-esophageal reflux disease without esophagitis: Secondary | ICD-10-CM

## 2024-03-16 DIAGNOSIS — K9400 Colostomy complication, unspecified: Secondary | ICD-10-CM

## 2024-03-16 DIAGNOSIS — Z8719 Personal history of other diseases of the digestive system: Secondary | ICD-10-CM | POA: Diagnosis not present

## 2024-03-16 LAB — CBC WITH DIFFERENTIAL/PLATELET
Basophils Absolute: 0 10*3/uL (ref 0.0–0.1)
Basophils Relative: 0.6 % (ref 0.0–3.0)
Eosinophils Absolute: 0.1 10*3/uL (ref 0.0–0.7)
Eosinophils Relative: 1.9 % (ref 0.0–5.0)
HCT: 42 % (ref 36.0–46.0)
Hemoglobin: 13.5 g/dL (ref 12.0–15.0)
Lymphocytes Relative: 38.6 % (ref 12.0–46.0)
Lymphs Abs: 1.7 10*3/uL (ref 0.7–4.0)
MCHC: 32.3 g/dL (ref 30.0–36.0)
MCV: 92.7 fl (ref 78.0–100.0)
Monocytes Absolute: 0.6 10*3/uL (ref 0.1–1.0)
Monocytes Relative: 13.2 % — ABNORMAL HIGH (ref 3.0–12.0)
Neutro Abs: 2 10*3/uL (ref 1.4–7.7)
Neutrophils Relative %: 45.7 % (ref 43.0–77.0)
Platelets: 194 10*3/uL (ref 150.0–400.0)
RBC: 4.53 Mil/uL (ref 3.87–5.11)
RDW: 14.6 % (ref 11.5–15.5)
WBC: 4.3 10*3/uL (ref 4.0–10.5)

## 2024-03-16 LAB — COMPREHENSIVE METABOLIC PANEL
ALT: 12 U/L (ref 0–35)
AST: 13 U/L (ref 0–37)
Albumin: 4 g/dL (ref 3.5–5.2)
Alkaline Phosphatase: 82 U/L (ref 39–117)
BUN: 17 mg/dL (ref 6–23)
CO2: 28 meq/L (ref 19–32)
Calcium: 9.2 mg/dL (ref 8.4–10.5)
Chloride: 108 meq/L (ref 96–112)
Creatinine, Ser: 0.9 mg/dL (ref 0.40–1.20)
GFR: 65.87 mL/min (ref >60.00)
Glucose, Bld: 74 mg/dL (ref 70–99)
Potassium: 3.8 meq/L (ref 3.5–5.1)
Sodium: 142 meq/L (ref 135–145)
Total Bilirubin: 0.3 mg/dL (ref 0.2–1.2)
Total Protein: 7.2 g/dL (ref 6.0–8.3)

## 2024-03-16 LAB — SEDIMENTATION RATE: Sed Rate: 20 mm/h (ref 0–30)

## 2024-03-16 MED ORDER — FAMOTIDINE 40 MG PO TABS: 40.0000 mg | ORAL_TABLET | Freq: Every day | ORAL | 0 refills | Status: DC

## 2024-03-16 MED ORDER — SUCRALFATE 1 G PO TABS: 1.0000 g | ORAL_TABLET | Freq: Three times a day (TID) | ORAL | 0 refills | Status: DC

## 2024-03-16 NOTE — Patient Instructions (Addendum)
 We have sent the following medications to your pharmacy for you to pick up at your convenience:  Famotidine 40mg  one tablet daily  Carafate 1g 4 times daily with meals and at bedtime.  You have been scheduled for an endoscopy. Please follow written instructions given to you at your visit today.  If you use inhalers (even only as needed), please bring them with you on the day of your procedure.  If you take any of the following medications, they will need to be adjusted prior to your procedure:   DO NOT TAKE 7 DAYS PRIOR TO TEST- Trulicity (dulaglutide) Ozempic, Wegovy (semaglutide) Mounjaro (tirzepatide) Bydureon Bcise (exanatide extended release)  DO NOT TAKE 1 DAY PRIOR TO YOUR TEST Rybelsus (semaglutide) Adlyxin (lixisenatide) Victoza (liraglutide) Byetta (exanatide) ___________________________________________________________________________    Your provider has requested that you go to the basement level for lab work before leaving today. Press "B" on the elevator. The lab is located at the first door on the left as you exit the elevator.   Please take your proton pump inhibitor medication, protonix 40 mg twice  Please take this medication 30 minutes to 1 hour before meals- this makes it more effective.  Add on pepcid at night 40 mg Sending a medication called Carafate, this mechanically coats your stomach. Can cause constipation and darker stools. Take about 30 mins to 1 hour before food and before bed.  If the pill is too large to take, can dissolve it in water and a small orange juice glass or shot glass and take it more as a liquid.  Avoid spicy and acidic foods Avoid fatty foods Limit your intake of coffee, tea, alcohol, and carbonated drinks Work to maintain a healthy weight Keep the head of the bed elevated at least 3 inches with blocks or a wedge pillow if you are having any nighttime symptoms Stay upright for 2 hours after eating Avoid meals and snacks three to  four hours before bedtime  Miralax is an osmotic laxative.  It only brings more water into the stool.  This is safe to take daily.  Can take up to 17 gram of miralax twice a day.  Mix with juice or coffee.  Start 1 capful at night for 3-4 days and reassess your response in 3-4 days.  You can increase and decrease the dose based on your response.  Remember, it can take up to 3-4 days to take effect OR for the effects to wear off.   I often pair this with benefiber in the morning to help assure the stool is not too loose.   Gastroparesis Please do small frequent meals like 4-6 meals a day.  Eat and drink liquids at separate times.  Avoid high fiber foods, cook your vegetables, avoid high fat food.  Suggest spreading protein throughout the day (greek yogurt, glucerna, soft meat, milk, eggs) Choose soft foods that you can mash with a fork When you are more symptomatic, change to pureed foods foods and liquids.  Consider reading "Living well with Gastroparesis" by Reuel Derby Gastroparesis is a condition in which food takes longer than normal to empty from the stomach. This condition is also known as delayed gastric emptying. It is usually a long-term (chronic) condition. There is no cure, but there are treatments and things that you can do at home to help relieve symptoms. Treating the underlying condition that causes gastroparesis can also help relieve symptoms What are the causes? In many cases, the cause of this condition is  not known. Possible causes include: A hormone (endocrine) disorder, such as hypothyroidism or diabetes. A nervous system disease, such as Parkinson's disease or multiple sclerosis. Cancer, infection, or surgery that affects the stomach or vagus nerve. The vagus nerve runs from your chest, through your neck, and to the lower part of your brain. A connective tissue disorder, such as scleroderma. Certain medicines. What increases the risk? You are more likely  to develop this condition if: You have certain disorders or diseases. These may include: An endocrine disorder. An eating disorder. Amyloidosis. Scleroderma. Parkinson's disease. Multiple sclerosis. Cancer or infection of the stomach or the vagus nerve. You have had surgery on your stomach or vagus nerve. You take certain medicines. You are female. What are the signs or symptoms? Symptoms of this condition include: Feeling full after eating very little or a loss of appetite. Nausea, vomiting, or heartburn. Bloating of your abdomen. Inconsistent blood sugar (glucose) levels on blood tests. Unexplained weight loss. Acid from the stomach coming up into the esophagus (gastroesophageal reflux). Sudden tightening (spasm) of the stomach, which can be painful. Symptoms may come and go. Some people may not notice any symptoms. How is this diagnosed? This condition is diagnosed with tests, such as: Tests that check how long it takes food to move through the stomach and intestines. These tests include: Upper gastrointestinal (GI) series. For this test, you drink a liquid that shows up well on X-rays, and then X-rays are taken of your intestines. Gastric emptying scintigraphy. For this test, you eat food that contains a small amount of radioactive material, and then scans are taken. Wireless capsule GI monitoring system. For this test, you swallow a pill (capsule) that records information about how foods and fluid move through your stomach. Gastric manometry. For this test, a tube is passed down your throat and into your stomach to measure electrical and muscular activity. Endoscopy. For this test, a long, thin tube with a camera and light on the end is passed down your throat and into your stomach to check for problems in your stomach lining. Ultrasound. This test uses sound waves to create images of the inside of your body. This can help rule out gallbladder disease or pancreatitis as a cause of  your symptoms. How is this treated? There is no cure for this condition, but treatment and home care may relieve symptoms. Treatment may include: Treating the underlying cause. Managing your symptoms by making changes to your diet and exercise habits. Taking medicines to control nausea and vomiting and to stimulate stomach muscles. Getting food through a feeding tube in the hospital. This may be done in severe cases. Having surgery to insert a device called a gastric electrical stimulator into your body. This device helps improve stomach emptying and control nausea and vomiting. Follow these instructions at home: Take over-the-counter and prescription medicines only as told by your health care provider. Follow instructions from your health care provider about eating or drinking restrictions. Your health care provider may recommend that you: Eat smaller meals more often. Eat low-fat foods. Eat low-fiber forms of high-fiber foods. For example, eat cooked vegetables instead of raw vegetables. Have only liquid foods instead of solid foods. Liquid foods are easier to digest. Drink enough fluid to keep your urine pale yellow. Exercise as often as told by your health care provider. Keep all follow-up visits. This is important. Contact a health care provider if you: Notice that your symptoms do not improve with treatment. Have new symptoms. Get help  right away if you: Have severe pain in your abdomen that does not improve with treatment. Have nausea that is severe or does not go away. Vomit every time you drink fluids. Summary Gastroparesis is a long-term (chronic) condition in which food takes longer than normal to empty from the stomach. Symptoms include nausea, vomiting, heartburn, bloating of your abdomen, and loss of appetite. Eating smaller portions, low-fat foods, and low-fiber forms of high-fiber foods may help you manage your symptoms. Get help right away if you have severe pain in your  abdomen. This information is not intended to replace advice given to you by your health care provider. Make sure you discuss any questions you have with your health care provider. Document Revised: 04/18/2020 Document Reviewed: 04/18/2020 Elsevier Patient Education  2021 [employer]  Diverticulosis Diverticulosis is a condition that develops when small pouches (diverticula) form in the wall of the large intestine (colon). The colon is where water is absorbed and stool (feces) is formed. The pouches form when the inside layer of the colon pushes through weak spots in the outer layers of the colon. You may have a few pouches or many of them. The pouches usually do not cause problems unless they become inflamed or infected. When this happens, the condition is called diverticulitis- this is left lower quadrant pain, diarrhea, fever, chills, nausea or vomiting.  If this occurs please call the office or go to the hospital. Sometimes these patches without inflammation can also have painless bleeding associated with them, if this happens please call the office or go to the hospital. Preventing constipation and increasing fiber can help reduce diverticula and prevent complications. Even if you feel you have a high-fiber diet, suggest getting on Benefiber or Cirtracel 2 times daily.

## 2024-03-16 NOTE — Progress Notes (Signed)
 Agree with the assessment and plan as outlined by Quentin Mulling, PA-C.  Agree with plan for EGD to evaluate grade/severity of hiatal hernia noted on recent esophagram.  Unfortunately, having multiple contraindications to concomitant TIF, to include history of sleeve gastrectomy and the lower esophageal diverticulum.  GES and esophageal manometry certainly reasonable considerations, particularly if considering any surgical redo.  Pardeep Pautz, DO, [facility]

## 2024-04-14 ENCOUNTER — Telehealth: Payer: Self-pay | Admitting: Neurology

## 2024-04-14 NOTE — Telephone Encounter (Signed)
 Appointment details confirmed

## 2024-04-17 ENCOUNTER — Ambulatory Visit (INDEPENDENT_AMBULATORY_CARE_PROVIDER_SITE_OTHER): Admitting: Neurology

## 2024-04-17 ENCOUNTER — Encounter: Payer: Self-pay | Admitting: Neurology

## 2024-04-17 DIAGNOSIS — R208 Other disturbances of skin sensation: Secondary | ICD-10-CM | POA: Diagnosis not present

## 2024-04-17 DIAGNOSIS — G5603 Carpal tunnel syndrome, bilateral upper limbs: Secondary | ICD-10-CM | POA: Diagnosis not present

## 2024-04-17 DIAGNOSIS — G629 Polyneuropathy, unspecified: Secondary | ICD-10-CM | POA: Diagnosis not present

## 2024-04-17 NOTE — Progress Notes (Signed)
 Chief Complaint  Patient presents with   [place]/EMG    Rm EMG/#4. Alone.       ASSESSMENT AND PLAN  Kristin Gibson is a 68 y.o. female   Bilateral upper and lower extremity paresthesia, History of significant GI procedure, gastric sleeve, repair of small bowel obstruction, reversal of colostomy  EMG nerve conduction study confirmed moderate axonal sensorimotor polyneuropathy, moderate bilateral carpal tunnel syndromes,  Laboratory evaluation showed no treatable etiology  On gabapentin  300 mg 3 times a day, which has been helpful  DIAGNOSTIC DATA (LABS, IMAGING, TESTING) - I reviewed patient records, labs, notes, testing and imaging myself where available.   MEDICAL HISTORY:  Kristin Gibson, is a 67 year old female seen in request by Dr. Godwin Lat for EMG nerve conduction study   History is obtained from the patient and review of electronic medical records. I personally reviewed pertinent available imaging films in PACS.   PMHx of  Hypertension Prediabetes Obstructive sleep apnea on CPAP History of gastric sleeve in Feb 2023, Partial colectomy Repair of small bowel obstruction Colostomy reversed in September 2024 Right knee replacement   She began to notice gradual onset bilateral hands and feet numbness around September 2023, after she had extensive GI procedure, mainly at the toes and the fingertips, most bothersome at nighttime, sometimes she has to shake her hands to make the discomfort go away, over the past few months, mild progression of numbness, seems to be involving from toes to heels, pins needle sensation  She denied weakness,  Laboratory: Normal CMP creatinine of 0.9, normal ESR C-reactive protein, copper  level, SSA B, no M protein   PHYSICAL EXAM:   Vitals:   04/17/24 0923  BP: 118/88  Pulse: 68  Weight: 269 lb (122 kg)  Height: 5\' 9"  (1.753 m)   Not recorded     Body mass index is 39.72 kg/m.  PHYSICAL EXAMNIATION:  Gen: NAD,  conversant, well nourised, well groomed                     Cardiovascular: Regular rate rhythm, no peripheral edema, warm, nontender. Eyes: Conjunctivae clear without exudates or hemorrhage Neck: Supple, no carotid bruits. Pulmonary: Clear to auscultation bilaterally   NEUROLOGICAL EXAM:  MENTAL STATUS: Speech/cognition: Awake, alert, oriented to history taking and casual conversation CRANIAL NERVES: CN II: Visual fields are full to confrontation. Pupils are round equal and briskly reactive to light. CN III, IV, VI: extraocular movement are normal. No ptosis. CN V: Facial sensation is intact to light touch CN VII: Face is symmetric with normal eye closure  CN VIII: Hearing is normal to causal conversation. CN IX, X: Phonation is normal. CN XI: Head turning and shoulder shrug are intact  MOTOR: Normal strength  REFLEXES: Reflexes are 1 and symmetric at the biceps, triceps, knees, and absent at ankles. Plantar responses are flexor.  SENSORY: Mildly length-dependent vibratory sensation pinprick to ankle level  COORDINATION: There is no trunk or limb dysmetria noted.  GAIT/STANCE: Posture is normal. Gait is steady  Romberg is absent.  REVIEW OF SYSTEMS:  Full 14 system review of systems performed and notable only for as above All other review of systems were negative.   ALLERGIES: Allergies  Allergen Reactions   Benazepril Swelling   Other Nausea And Vomiting    Post anesthesia   Sulfa Antibiotics Itching and Nausea Only   Elemental Sulfur Itching and Nausea Only    HOME MEDICATIONS: Current Outpatient Medications  Medication Sig Dispense Refill  acetaminophen  (TYLENOL ) 500 MG tablet Take 1,000 mg by mouth daily as needed for mild pain. pain      alum & mag hydroxide-simeth (MAALOX MAX) 400-400-40 MG/5ML suspension Take 5 mLs by mouth every 6 (six) hours as needed for indigestion. 355 mL 0   diclofenac Sodium (VOLTAREN) 1 % GEL Apply 2 g topically daily as needed  (pain).     dicyclomine (BENTYL) 20 MG tablet Take 20 mg by mouth daily as needed for spasms.     famotidine  (PEPCID ) 40 MG tablet Take 1 tablet (40 mg total) by mouth at bedtime. 90 tablet 0   fluconazole  (DIFLUCAN ) 150 MG tablet Take 1 tablet (150 mg total) by mouth daily as needed (yeast infection). 3 tablet 0   gabapentin  (NEURONTIN ) 300 MG capsule Take 1 capsule (300 mg total) by mouth 3 (three) times daily. 90 capsule 11   lidocaine  (XYLOCAINE ) 2 % solution Use as directed 15 mLs in the mouth or throat as needed for mouth pain. 100 mL 0   methocarbamol  (ROBAXIN -750) 750 MG tablet Take 1 tablet (750 mg total) by mouth every 8 (eight) hours as needed for muscle spasms. 30 tablet 0   Multiple Vitamins-Minerals (BARIATRIC FUSION) CHEW Chew 1 tablet by mouth daily.     NON FORMULARY Pt uses a cpap nightly     nystatin  (MYCOSTATIN ) 100000 UNIT/ML suspension Take 5 mLs (500,000 Units total) by mouth 4 (four) times daily as needed (mouth pain). 60 mL 0   ondansetron  (ZOFRAN ) 4 MG tablet Take 1 tablet (4 mg total) by mouth every 6 (six) hours. 12 tablet 0   ondansetron  (ZOFRAN -ODT) 4 MG disintegrating tablet Take 1 tablet (4 mg total) by mouth every 6 (six) hours as needed for nausea or vomiting. 20 tablet 0   pantoprazole  (PROTONIX ) 40 MG tablet Take 40 mg by mouth 2 (two) times daily.     Probiotic CAPS Take 1 capsule by mouth daily. 30 capsule 0   spironolactone  (ALDACTONE ) 50 MG tablet Take 50 mg by mouth daily.     sucralfate  (CARAFATE ) 1 g tablet Take 1 tablet (1 g total) by mouth 4 (four) times daily -  with meals and at bedtime. 120 tablet 0   No current facility-administered medications for this visit.    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   Hypertension    Knee pain, chronic    Lower back pain    PONV (postoperative nausea and  vomiting)    Pre-diabetes    Sleep apnea    cpap    PAST SURGICAL HISTORY: Past Surgical History:  Procedure Laterality Date   ABDOMINAL HYSTERECTOMY     BREAST REDUCTION SURGERY     FLEXIBLE SIGMOIDOSCOPY N/A 09/05/2023   Procedure: FLEXIBLE SIGMOIDOSCOPY;  Surgeon: Melvenia Stabs, MD;  Location: [facility];  Service: General;  Laterality: N/A;   IR CATHETER TUBE CHANGE  09/12/2022   IR SINUS/FIST TUBE CHK-NON GI  09/18/2022   KNEE SURGERY     LAPAROSCOPIC GASTRIC SLEEVE RESECTION N/A 02/19/2022   Procedure: LAPAROSCOPIC GASTRIC SLEEVE RESECTION, HIATAL HERNIA REPAIR;  Surgeon: Adalberto Acton, MD;  Location: [facility];  Service: General;  Laterality: N/A;   PARTIAL COLECTOMY N/A 08/31/2022   Procedure: OPEN PARTIAL COLECTOMY WITH COLOSTOMY;  Surgeon: Oralee Billow, MD;  Location: [facility];  Service: General;  Laterality: N/A;   right knee replacement      TRIGGER FINGER RELEASE Right 04/2023   thumb, [facility] Dr. Marlane Silver   TUBAL LIGATION     UPPER GI ENDOSCOPY N/A 02/19/2022   Procedure: UPPER GI ENDOSCOPY;  Surgeon: Adalberto Acton, MD;  Location: [facility];  Service: General;  Laterality: N/A;   XI ROBOTIC ASSISTED COLOSTOMY TAKEDOWN N/A 09/05/2023   Procedure: XI ROBOTIC ASSISTED COLOSTOMY REVERAL; LYSIS OF ADHESIONS, BILATERAL TAP BLOCK, REPAIR OF SMALL BOWEL X 4;  Surgeon: Melvenia Stabs, MD;  Location: [facility];  Service: General;  Laterality: N/A;    FAMILY HISTORY: Family History  Problem Relation Age of Onset   Hypertension Mother    Cancer Sister    Diabetes Sister    Liver disease Neg Hx    Esophageal cancer Neg Hx    Colon cancer Neg Hx    Rectal cancer Neg Hx    Stomach cancer Neg Hx    Neuropathy Neg Hx     SOCIAL HISTORY: Social History   Socioeconomic History   Marital status: Divorced    Spouse name: Not on file   Number of children: 2   Years of education: Not on file   Highest education level: Not on file  Occupational History   Occupation:  retired  Tobacco Use   Smoking status: Former   Smokeless tobacco: Never  Vaping Use   Vaping status: Never Used  Substance and Sexual Activity   Alcohol use: Yes    Comment: occasionally   Drug use: No   Sexual activity: Yes    Birth control/protection: Surgical  Other Topics Concern   Not on file  Social History Narrative   Retired    Lives alone    Social Drivers of Health   Financial Resource Strain: Low Risk  (03/10/2024)   Received from [facility]   Overall Financial Resource Strain (CARDIA)    Difficulty of Paying Living Expenses: Not very hard  Food Insecurity: No Food Insecurity (03/10/2024)   Received from [facility]   Hunger Vital Sign    Worried About Running Out of Food in the Last Year: Never true    Ran Out of Food in the Last Year: Never true  Transportation Needs: Unmet Transportation Needs (03/10/2024)   Received from [facility] - Transportation    Lack of Transportation (Medical): Yes    Lack of Transportation (Non-Medical): Yes  Physical Activity: Insufficiently Active (03/10/2024)   Received from [facility]   Exercise Vital Sign    Days of Exercise per Week: 2 days    Minutes of Exercise per Session: 20 min  Stress: No Stress Concern Present (03/10/2024)   Received from [facility] - Occupational Stress Questionnaire    Feeling of Stress : Not at all  Social Connections: Socially Integrated (03/10/2024)   Received from [facility]   Social Network    How would you rate your social network (family, work, friends)?: Good participation with social networks  Intimate Partner Violence: Not At Risk (03/10/2024)   Received from [facility]   HITS    Over the last 12 months how often did your partner physically hurt you?: Never    Over the last 12 months how often did your partner insult you or talk down to you?: Never    Over the last 12 months how often did your partner threaten you with  physical harm?: Never    Over the last 12 months how often did your partner scream or curse at you?: Never      Phebe Brasil, M.D. Ph.D.  [facility] [address], [address] [place], [place] [address] Ph: [phone] Fax: [phone]  CC:  Loree Roe, MD [address] [place],  [place] [address]  Loree Roe, MD

## 2024-04-21 ENCOUNTER — Encounter: Payer: Self-pay | Admitting: Gastroenterology

## 2024-04-21 ENCOUNTER — Ambulatory Visit: Admitting: Gastroenterology

## 2024-04-21 VITALS — BP 148/86 | HR 52 | Temp 98.2°F | Resp 11 | Ht 69.0 in | Wt 266.0 lb

## 2024-04-21 DIAGNOSIS — K2100 Gastro-esophageal reflux disease with esophagitis, without bleeding: Secondary | ICD-10-CM

## 2024-04-21 DIAGNOSIS — K3189 Other diseases of stomach and duodenum: Secondary | ICD-10-CM

## 2024-04-21 DIAGNOSIS — K2970 Gastritis, unspecified, without bleeding: Secondary | ICD-10-CM

## 2024-04-21 DIAGNOSIS — K449 Diaphragmatic hernia without obstruction or gangrene: Secondary | ICD-10-CM | POA: Diagnosis not present

## 2024-04-21 DIAGNOSIS — K319 Disease of stomach and duodenum, unspecified: Secondary | ICD-10-CM | POA: Diagnosis not present

## 2024-04-21 DIAGNOSIS — R933 Abnormal findings on diagnostic imaging of other parts of digestive tract: Secondary | ICD-10-CM

## 2024-04-21 DIAGNOSIS — Z903 Acquired absence of stomach [part of]: Secondary | ICD-10-CM

## 2024-04-21 MED ORDER — SODIUM CHLORIDE 0.9 % IV SOLN
500.0000 mL | Freq: Once | INTRAVENOUS | Status: DC
Start: 2024-04-21 — End: 2024-04-21

## 2024-04-21 NOTE — Procedures (Unsigned)
 Full Name: Kristin Gibson Gender: Female MRN #: [id number] Date of Birth: [birth year]    Visit Date: 04/17/2024 09:43 Age: 68 Years Examining Physician: Phebe Brasil Referring Physician: Hortensia Ma Height: 5 feet 9 inch History: 68 year old female complains of intermittent upper and lower extremity paresthesia  Summary of tests: Nerve conduction study: Bilateral sural sensory responses were absent, it was a technically difficult study due to bilateral ankle swelling  Bilateral superficial peroneal sensory responses were within normal limit.  Bilateral tibial motor responses were absent.  Right peroneal to EDB motor response showed significantly decreased CMAP amplitude.  Right median sensory response was absent.  Left median sensory response showed moderately prolonged peak latency with moderately decreased snap amplitude.  Bilateral median motor responses showed mild to moderately prolonged distal latency with well-preserved CMAP amplitude Bilateral ulnar left,  radial sensory responses were within normal limits  Bilateral ulnar motor responses showed no significant abnormality  Electromyography: Selected needle examination of right upper, lower extremity muscles cervical and lumbar paraspinal muscles showed no significant abnormalities  Conclusion: This is an abnormal study.  There is electrodiagnostic evidence of moderate axonal sensorimotor polyneuropathy.  In addition, there is evidence of bilateral distal median neuropathy across the wrist, consistent with moderate bilateral carpal tunnel syndromes.    ---------------------------- Phebe Brasil. M.D. Ph.D.   [facility] [address], [address] [place], [place] [address] Tel: [phone] Fax: [phone]  Verbal informed consent was obtained from the patient, patient was informed of potential risk of procedure, including bruising, bleeding, hematoma formation, infection, muscle weakness,  muscle pain, numbness, among others.        MNC    Nerve / Sites Muscle Latency Ref. Amplitude Ref. Rel Amp Segments Distance Velocity Ref. Area    ms ms mV mV %  cm m/s m/s mVms  R Median - APB     Wrist APB 5.2 <=4.4 5.3 >=4.0 100 Wrist - APB 7   22.8     Upper arm APB 10.3  5.2  99.3 Upper arm - Wrist 27 53 >=49 21.4  L Median - APB     Wrist APB 4.9 <=4.4 6.5 >=4.0 100 Wrist - APB 7   24.8     Upper arm APB 10.1  5.6  87 Upper arm - Wrist 27 51 >=49 22.5  R Ulnar - ADM     Wrist ADM 3.0 <=3.3 6.0 >=6.0 100 Wrist - ADM 8   25.9     B.Elbow ADM 5.8  6.0  99 B.Elbow - Wrist 16 57 >=49 26.7     A.Elbow ADM 8.8  5.6  93.6 A.Elbow - B.Elbow 16 55 >=49 25.0  L Ulnar - ADM     Wrist ADM 2.9 <=3.3 5.5 >=6.0 100 Wrist - ADM 7   23.6     B.Elbow ADM 5.7  5.7  105 B.Elbow - Wrist 17 60 >=49 24.5     A.Elbow ADM 7.2  6.1  107 A.Elbow - B.Elbow 9 58 >=49 25.9  R Peroneal - EDB     Ankle EDB 5.8 <=6.5 0.7 >=2.0 100 Ankle - EDB 9   1.6     Fib head EDB 15.5  1.0  148 Fib head - Ankle 32 33 >=44 1.3     Pop fossa EDB 17.9  0.5  54.6 Pop fossa - Fib head 10 41 >=44 2.0         Pop fossa - Ankle  R Tibial - AH     Ankle AH NR <=5.8 NR >=4.0 NR Ankle - AH 9   NR  L Tibial - AH     Ankle AH NR <=5.8 NR >=4.0 NR Ankle - AH 9   NR                   SNC    Nerve / Sites Rec. Site Peak Lat Ref.  Amp Ref. Segments Distance Ref.    ms ms V V  cm ms  L Radial - Anatomical snuff box (Forearm)     Forearm Wrist 2.2 <=2.9 13 >=15 Forearm - Wrist 10   R Sural - Ankle (Calf)     Calf Ankle NR <=4.4 NR >=6 Calf - Ankle 14   L Sural - Ankle (Calf)     Calf Ankle NR <=4.4 NR >=6 Calf - Ankle 14   R Superficial peroneal - Ankle     Lat leg Ankle 3.9 <=4.4 6 >=6 Lat leg - Ankle 14   L Superficial peroneal - Ankle     Lat leg Ankle 2.6 <=4.4 10 >=6 Lat leg - Ankle 14   R Median - Orthodromic (Dig II, Mid palm)     Dig II Wrist NR <=3.4 NR >=10 Dig II - Wrist 13   L Median - Orthodromic (Dig II,  Mid palm)     Dig II Wrist 4.3 <=3.4 5 >=10 Dig II - Wrist 13   R Ulnar - Orthodromic, (Dig V, Mid palm)     Dig V Wrist 2.7 <=3.1 7 >=5 Dig V - Wrist 11   L Ulnar - Orthodromic, (Dig V, Mid palm)     Dig V Wrist 2.9 <=3.1 6 >=5 Dig V - Wrist 56                          F  Wave    Nerve F Lat Ref.   ms ms  R Ulnar - ADM 33.6 <=32.0  L Ulnar - ADM 29.4 <=32.0       EMG Summary Table    Spontaneous MUAP Recruitment  Muscle IA Fib PSW Fasc Other Amp Dur. Poly Pattern  R. Tibialis anterior Normal None None None _______ Normal Normal Normal Normal  R. Tibialis posterior Normal None None None _______ Normal Normal Normal Normal  R. Gastrocnemius (Medial head) Normal None None None _______ Normal Normal Normal Normal  R. Vastus lateralis Normal None None None _______ Normal Normal Normal Normal  R. Lumbar paraspinals (low) Normal None None None _______ Normal Normal Normal Normal  R. Lumbar paraspinals (mid) Normal None None None _______ Normal Normal Normal Normal  R. First dorsal interosseous Normal None None None _______ Normal Normal Normal Normal  R. Abductor pollicis brevis Normal None None None _______ Normal Normal Normal Normal  R. Pronator teres Normal None None None _______ Normal Normal Normal Normal  R. Biceps brachii Normal None None None _______ Normal Normal Normal Normal  R. Deltoid Normal None None None _______ Normal Normal Normal Normal  R. Triceps brachii Normal None None None _______ Normal Normal Normal Normal  R. Cervical paraspinals Normal None None None _______ Normal Normal Normal Normal

## 2024-04-21 NOTE — Progress Notes (Signed)
 Vss nad trans to pacu

## 2024-04-21 NOTE — Patient Instructions (Addendum)
 - Resume previous diet.                           - Await pathology results.                           - Increase Protonix  (pantoprazole ) to 40 mg PO BID                            for 4 weeks to promote mucosal healing of erosive                            esophagitis and gastritis. If upper GI symptoms                            improve on high-dose PPI, can potentially reduce to                            40 mg daily for continued control of reflux.                           - Continue other present medications.                           - Follow-up in the GI Clinic and with Dr. Lanell Pinta in                            the Bariatric Surgery Clinic.   YOU HAD AN ENDOSCOPIC PROCEDURE TODAY AT THE [facility]:   Refer to the procedure report that was given to you for any specific questions about what was found during the examination.  If the procedure report does not answer your questions, please call your gastroenterologist to clarify.  If you requested that your care partner not be given the details of your procedure findings, then the procedure report has been included in a sealed envelope for you to review at your convenience later.  YOU SHOULD EXPECT: Some feelings of bloating in the abdomen. Passage of more gas than usual.  Walking can help get rid of the air that was put into your GI tract during the procedure and reduce the bloating. If you had a lower endoscopy (such as a colonoscopy or flexible sigmoidoscopy) you may notice spotting of blood in your stool or on the toilet paper. If you underwent a bowel prep for your procedure, you may not have a normal bowel movement for a few days.  Please Note:  You might notice some irritation and congestion in your nose or some drainage.  This is from the oxygen used during your procedure.  There is no need for concern and it should clear up in a day or so.  SYMPTOMS TO REPORT IMMEDIATELY:   Following upper  endoscopy (EGD)  Vomiting of blood or coffee ground material  New chest pain or pain under the shoulder blades  Painful or persistently difficult swallowing  New shortness of breath  Fever of 100F or higher  Black, tarry-looking stools  For urgent or emergent issues, a gastroenterologist can be reached at any hour by calling [phone]. Do not use  MyChart messaging for urgent concerns.    DIET:  We do recommend a small meal at first, but then you may proceed to your regular diet.  Drink plenty of fluids but you should avoid alcoholic beverages for 24 hours.  ACTIVITY:  You should plan to take it easy for the rest of today and you should NOT DRIVE or use heavy machinery until tomorrow (because of the sedation medicines used during the test).    FOLLOW UP: Our staff will call the number listed on your records the next business day following your procedure.  We will call around 7:15- 8:00 am to check on you and address any questions or concerns that you may have regarding the information given to you following your procedure. If we do not reach you, we will leave a message.     If any biopsies were taken you will be contacted by phone or by letter within the next 1-3 weeks.  Please call us  at [phone] if you have not heard about the biopsies in 3 weeks.    SIGNATURES/CONFIDENTIALITY: You and/or your care partner have signed paperwork which will be entered into your electronic medical record.  These signatures attest to the fact that that the information above on your After Visit Summary has been reviewed and is understood.  Full responsibility of the confidentiality of this discharge information lies with you and/or your care-partner.

## 2024-04-21 NOTE — Progress Notes (Signed)
 Called to room to assist during endoscopic procedure.  Patient ID and intended procedure confirmed with present staff. Received instructions for my participation in the procedure from the performing physician.

## 2024-04-21 NOTE — Op Note (Signed)
 [facility] Patient Name: Kristin Gibson Procedure Date: 04/21/2024 3:53 PM MRN: [id number] Endoscopist: Harry Lindau , MD, [id number] Age: 68 Referring MD:  Date of Birth: [birth year] Gender: Female Account #: [id number] Procedure:                Upper GI endoscopy Indications:              Epigastric abdominal pain, Suspected esophageal                            reflux, Abnormal cine-esophagram, Regurgitation Medicines:                Monitored Anesthesia Care Procedure:                Pre-Anesthesia Assessment:                           - Prior to the procedure, a History and Physical                            was performed, and patient medications and                            allergies were reviewed. The patient's tolerance of                            previous anesthesia was also reviewed. The risks                            and benefits of the procedure and the sedation                            options and risks were discussed with the patient.                            All questions were answered, and informed consent                            was obtained. Prior Anticoagulants: The patient has                            taken no anticoagulant or antiplatelet agents. ASA                            Grade Assessment: III - A patient with severe                            systemic disease. After reviewing the risks and                            benefits, the patient was deemed in satisfactory                            condition to undergo the procedure.  After obtaining informed consent, the endoscope was                            passed under direct vision. Throughout the                            procedure, the patient's blood pressure, pulse, and                            oxygen saturations were monitored continuously. The                            Olympus Scope [id number] was introduced through the                             mouth, and advanced to the second part of duodenum.                            The upper GI endoscopy was accomplished without                            difficulty. The patient tolerated the procedure                            well. Scope In: Scope Out: Findings:                 LA Grade C (one or more mucosal breaks continuous                            between tops of 2 or more mucosal folds, less than                            75% circumference) esophagitis with no bleeding was                            found in the lower third of the esophagus.                           A 1-2 cm sliding type hiatal hernia was present.                           Evidence of a sleeve gastrectomy was found in the                            gastric fundus and in the gastric body. This was                            characterized by healthy appearing mucosa.                           Localized mild inflammation characterized by  erythema was found in the gastric antrum. Biopsies                            were taken with a cold forceps for Helicobacter                            pylori testing. Estimated blood loss was minimal.                           The examined duodenum was normal. Complications:            No immediate complications. Estimated Blood Loss:     Estimated blood loss was minimal. Impression:               - LA Grade C reflux esophagitis with no bleeding.                           - 1-2 cm sliding type hiatal hernia.                           - A sleeve gastrectomy was found, characterized by                            healthy appearing mucosa.                           - Gastritis. Biopsied.                           - Normal examined duodenum. Recommendation:           - Patient has a contact number available for                            emergencies. The signs and symptoms of potential                            delayed complications were discussed  with the                            patient. Return to normal activities tomorrow.                            Written discharge instructions were provided to the                            patient.                           - Resume previous diet.                           - Await pathology results.                           - Increase Protonix  (pantoprazole ) to 40 mg PO BID  for 4 weeks to promote mucosal healing of erosive                            esophagitis and gastritis. If upper GI symptoms                            improve on high-dose PPI, can potentially reduce to                            40 mg daily for continued control of reflux.                           - Continue other present medications.                           - Follow-up in the GI Clinic and with Dr. Lanell Pinta in                            the Bariatric Surgery Clinic. Harry Lindau, MD 04/21/2024 4:14:52 PM

## 2024-04-21 NOTE — Progress Notes (Signed)
 Pt's states no medical or surgical changes since previsit or office visit.

## 2024-04-21 NOTE — Progress Notes (Signed)
 GASTROENTEROLOGY PROCEDURE H&P NOTE   Primary Care Physician: Loree Roe, MD    Reason for Procedure:   GERD, hiatal hernia, regurgitation. Sleeve gastrectomy, abdominal pain  Plan:    EGD  Patient is appropriate for endoscopic procedure(s) in the ambulatory ([facility]) setting.  The nature of the procedure, as well as the risks, benefits, and alternatives were carefully and thoroughly reviewed with the patient. Ample time for discussion and questions allowed. The patient understood, was satisfied, and agreed to proceed.     HPI: Kristin Gibson is a 68 y.o. female who presents for EGD for evaluation of GERD, hiatal hernia, epigastric pain, regurgitation.   She underwent a sleeve gastrectomy and hiatal hernia repair in February 2023. Since then, she has experienced persistent epigastric pain and reflux symptoms.   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   Hypertension    Knee pain, chronic    Lower back pain    PONV (postoperative nausea and vomiting)    Pre-diabetes    Sleep apnea    cpap    Past Surgical History:  Procedure Laterality Date   ABDOMINAL HYSTERECTOMY     BREAST REDUCTION SURGERY     COLONOSCOPY     FLEXIBLE SIGMOIDOSCOPY N/A 09/05/2023   Procedure: FLEXIBLE SIGMOIDOSCOPY;  Surgeon: Melvenia Stabs, MD;  Location: [facility];  Service: General;  Laterality: N/A;   IR CATHETER TUBE CHANGE  09/12/2022   IR SINUS/FIST TUBE CHK-NON GI  09/18/2022   KNEE SURGERY     LAPAROSCOPIC GASTRIC SLEEVE RESECTION N/A 02/19/2022   Procedure: LAPAROSCOPIC GASTRIC SLEEVE RESECTION, HIATAL HERNIA REPAIR;  Surgeon: Adalberto Acton, MD;  Location: [facility];  Service: General;  Laterality: N/A;   PARTIAL COLECTOMY N/A 08/31/2022   Procedure: OPEN PARTIAL COLECTOMY WITH COLOSTOMY;  Surgeon: Oralee Billow, MD;  Location: [facility];  Service:  General;  Laterality: N/A;   right knee replacement      TRIGGER FINGER RELEASE Right 04/2023   thumb, [facility] Dr. Marlane Silver   TUBAL LIGATION     UPPER GASTROINTESTINAL ENDOSCOPY     UPPER GI ENDOSCOPY N/A 02/19/2022   Procedure: UPPER GI ENDOSCOPY;  Surgeon: Adalberto Acton, MD;  Location: [facility];  Service: General;  Laterality: N/A;   XI ROBOTIC ASSISTED COLOSTOMY TAKEDOWN N/A 09/05/2023   Procedure: XI ROBOTIC ASSISTED COLOSTOMY REVERAL; LYSIS OF ADHESIONS, BILATERAL TAP BLOCK, REPAIR OF SMALL BOWEL X 4;  Surgeon: Melvenia Stabs, MD;  Location: [facility];  Service: General;  Laterality: N/A;    Prior to Admission medications   Medication Sig Start Date End Date Taking? Authorizing Provider  acetaminophen  (TYLENOL ) 500 MG tablet Take 1,000 mg by mouth daily as needed for mild pain. pain    Yes [provider]  famotidine  (PEPCID ) 40 MG tablet Take 1 tablet (40 mg total) by mouth at bedtime. 03/16/24  Yes Edmonia Gottron, PA-C  gabapentin  (NEURONTIN ) 300 MG capsule Take 1 capsule (300 mg total) by mouth 3 (three) times daily. 03/11/24  Yes Sater, Sherida Dimmer, MD  methocarbamol  (ROBAXIN -750) 750 MG tablet Take 1 tablet (750 mg total) by mouth every 8 (eight) hours as needed for muscle spasms. 09/11/23  Yes Melvenia Stabs, MD  ondansetron  (ZOFRAN ) 4 MG tablet Take 1 tablet (4 mg total) by mouth every 6 (six) hours. 04/10/23  Yes  Onetha Bile, MD  pantoprazole  (PROTONIX ) 40 MG tablet Take 40 mg by mouth 2 (two) times daily. 03/12/24  Yes [provider]  spironolactone  (ALDACTONE ) 50 MG tablet Take 50 mg by mouth daily. 04/20/21  Yes [provider]  sucralfate  (CARAFATE ) 1 g tablet Take 1 tablet (1 g total) by mouth 4 (four) times daily -  with meals and at bedtime. 03/16/24  Yes Santina Cull R, PA-C  alum & mag hydroxide-simeth (MAALOX MAX) 400-400-40 MG/5ML suspension Take 5 mLs by mouth every 6 (six) hours as needed for indigestion. 11/12/23   Henderly,  Britni A, PA-C  diclofenac Sodium (VOLTAREN) 1 % GEL Apply 2 g topically daily as needed (pain).    [provider]  dicyclomine (BENTYL) 20 MG tablet Take 20 mg by mouth daily as needed for spasms. 12/05/16   [provider]  fluconazole  (DIFLUCAN ) 150 MG tablet Take 1 tablet (150 mg total) by mouth daily as needed (yeast infection). 09/20/22   Meuth, Brooke A, PA-C  lidocaine  (XYLOCAINE ) 2 % solution Use as directed 15 mLs in the mouth or throat as needed for mouth pain. 11/12/23   Henderly, Britni A, PA-C  Multiple Vitamins-Minerals (BARIATRIC FUSION) CHEW Chew 1 tablet by mouth daily.    [provider]  NON FORMULARY Pt uses a cpap nightly    [provider]  Probiotic CAPS Take 1 capsule by mouth daily. 06/28/20   Ward, Clover Dao, DO    Current Outpatient Medications  Medication Sig Dispense Refill   acetaminophen  (TYLENOL ) 500 MG tablet Take 1,000 mg by mouth daily as needed for mild pain. pain      famotidine  (PEPCID ) 40 MG tablet Take 1 tablet (40 mg total) by mouth at bedtime. 90 tablet 0   gabapentin  (NEURONTIN ) 300 MG capsule Take 1 capsule (300 mg total) by mouth 3 (three) times daily. 90 capsule 11   methocarbamol  (ROBAXIN -750) 750 MG tablet Take 1 tablet (750 mg total) by mouth every 8 (eight) hours as needed for muscle spasms. 30 tablet 0   ondansetron  (ZOFRAN ) 4 MG tablet Take 1 tablet (4 mg total) by mouth every 6 (six) hours. 12 tablet 0   pantoprazole  (PROTONIX ) 40 MG tablet Take 40 mg by mouth 2 (two) times daily.     spironolactone  (ALDACTONE ) 50 MG tablet Take 50 mg by mouth daily.     sucralfate  (CARAFATE ) 1 g tablet Take 1 tablet (1 g total) by mouth 4 (four) times daily -  with meals and at bedtime. 120 tablet 0   alum & mag hydroxide-simeth (MAALOX MAX) 400-400-40 MG/5ML suspension Take 5 mLs by mouth every 6 (six) hours as needed for indigestion. 355 mL 0   diclofenac Sodium (VOLTAREN) 1 % GEL Apply 2 g topically daily as needed  (pain).     dicyclomine (BENTYL) 20 MG tablet Take 20 mg by mouth daily as needed for spasms.     fluconazole  (DIFLUCAN ) 150 MG tablet Take 1 tablet (150 mg total) by mouth daily as needed (yeast infection). 3 tablet 0   lidocaine  (XYLOCAINE ) 2 % solution Use as directed 15 mLs in the mouth or throat as needed for mouth pain. 100 mL 0   Multiple Vitamins-Minerals (BARIATRIC FUSION) CHEW Chew 1 tablet by mouth daily.     NON FORMULARY Pt uses a cpap nightly     Probiotic CAPS Take 1 capsule by mouth daily. 30 capsule 0   Current Facility-Administered Medications  Medication Dose Route Frequency Provider Last  Rate Last Admin   0.9 %  sodium chloride  infusion  500 mL Intravenous Once Charbel Los V, DO        Allergies as of 04/21/2024 - Review Complete 04/21/2024  Allergen Reaction Noted   Benazepril Swelling 03/18/2018   Other Nausea And Vomiting 08/19/2023   Sulfa antibiotics Itching and Nausea Only 02/24/2017   Elemental sulfur Itching and Nausea Only 10/11/2015    Family History  Problem Relation Age of Onset   Hypertension Mother    Cancer Sister    Diabetes Sister    Liver disease Neg Hx    Esophageal cancer Neg Hx    Colon cancer Neg Hx    Rectal cancer Neg Hx    Stomach cancer Neg Hx    Neuropathy Neg Hx     Social History   Socioeconomic History   Marital status: Divorced    Spouse name: Not on file   Number of children: 2   Years of education: Not on file   Highest education level: Not on file  Occupational History   Occupation: retired  Tobacco Use   Smoking status: Former   Smokeless tobacco: Never  Advertising account planner   Vaping status: Never Used  Substance and Sexual Activity   Alcohol use: Yes    Comment: occasionally   Drug use: No   Sexual activity: Yes    Birth control/protection: Surgical, Post-menopausal  Other Topics Concern   Not on file  Social History Narrative   Retired    Lives alone    Social Drivers of Health   Financial Resource  Strain: Low Risk  (03/10/2024)   Received from [facility]   Overall Financial Resource Strain (CARDIA)    Difficulty of Paying Living Expenses: Not very hard  Food Insecurity: No Food Insecurity (03/10/2024)   Received from [facility]   Hunger Vital Sign    Worried About Running Out of Food in the Last Year: Never true    Ran Out of Food in the Last Year: Never true  Transportation Needs: Unmet Transportation Needs (03/10/2024)   Received from [facility] - Transportation    Lack of Transportation (Medical): Yes    Lack of Transportation (Non-Medical): Yes  Physical Activity: Insufficiently Active (03/10/2024)   Received from [facility]   Exercise Vital Sign    Days of Exercise per Week: 2 days    Minutes of Exercise per Session: 20 min  Stress: No Stress Concern Present (03/10/2024)   Received from [facility] - Occupational Stress Questionnaire    Feeling of Stress : Not at all  Social Connections: Socially Integrated (03/10/2024)   Received from [facility]   Social Network    How would you rate your social network (family, work, friends)?: Good participation with social networks  Intimate Partner Violence: Not At Risk (03/10/2024)   Received from [facility]   HITS    Over the last 12 months how often did your partner physically hurt you?: Never    Over the last 12 months how often did your partner insult you or talk down to you?: Never    Over the last 12 months how often did your partner threaten you with physical harm?: Never    Over the last 12 months how often did your partner scream or curse at you?: Never    Physical Exam: Vital signs in last 24 hours: @BP  (!) 154/88   Pulse (!) 54  Temp 98.2 F (36.8 C) (Temporal)   Ht 5\' 9"  (1.753 m)   Wt 266 lb (120.7 kg)   SpO2 98%   BMI 39.28 kg/m  GEN: NAD EYE: Sclerae anicteric ENT: MMM CV: Non-tachycardic Pulm: CTA b/l GI: Soft, NT/ND NEURO:  Alert  & Oriented x 3   Harry Lindau, DO [facility]   04/21/2024 3:50 PM

## 2024-04-22 ENCOUNTER — Other Ambulatory Visit: Payer: Self-pay | Admitting: Physician Assistant

## 2024-04-22 ENCOUNTER — Telehealth: Payer: Self-pay

## 2024-04-22 NOTE — Telephone Encounter (Signed)
  Follow up Call-     04/21/2024    2:55 PM 07/04/2023    2:35 PM  Call back number  Post procedure Call Back phone  # [phone]  Permission to leave phone message Yes Yes     Patient questions:  Do you have a fever, pain , or abdominal swelling? No. Pain Score  0 *  Have you tolerated food without any problems? Yes.    Have you been able to return to your normal activities? Yes.    Do you have any questions about your discharge instructions: Diet   No. Medications  No. Follow up visit  No.  Do you have questions or concerns about your Care? No.  Actions: * If pain score is 4 or above: No action needed, pain <4.

## 2024-04-24 LAB — SURGICAL PATHOLOGY

## 2024-04-28 ENCOUNTER — Encounter: Payer: Self-pay | Admitting: Neurology

## 2024-05-02 ENCOUNTER — Encounter: Payer: Self-pay | Admitting: Gastroenterology

## 2024-05-14 NOTE — Progress Notes (Signed)
 Chief Complaint: Follow up Primary GI MD: Dr. Karene Oto  HPI: Discussed the use of AI scribe software for clinical note transcription with the patient, who gave verbal consent to proceed.  History of Present Illness Kristin Gibson is a 68 year old female with a history of hiatal hernia and sleeve gastrectomy who presents with persistent upper abdominal pain and reflux symptoms.  She experiences persistent upper abdominal pain and discomfort, primarily in the epigastric area, which worsens with eating and at night. The pain is non-radiating, and all foods seem to exacerbate her symptoms. She finds some relief with milk or ice cream, which provides slight soothing.  She has a history of hiatal hernia and underwent a sleeve gastrectomy. A previous endoscopy revealed esophagitis, a small hiatal hernia, and gastric inflammation. Despite an increased dosage of pantoprazole  to twice daily, she continues to experience significant symptoms.  She recalls a previous prescription of a thick lidocaine  solution from the emergency room, which was very helpful. Currently, she takes a medication three times a day and another at night, but these have not alleviated her symptoms.  No radiation of pain to the right side. She confirms having her gallbladder and mentions a CT scan from 2023 that showed no gallstones.     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   Hypertension    Knee pain, chronic    Lower back pain    PONV (postoperative nausea and vomiting)    Pre-diabetes    Sleep apnea    cpap    Past Surgical History:  Procedure Laterality Date   ABDOMINAL HYSTERECTOMY     BREAST REDUCTION SURGERY     COLONOSCOPY     FLEXIBLE SIGMOIDOSCOPY N/A 09/05/2023   Procedure: FLEXIBLE SIGMOIDOSCOPY;  Surgeon: Melvenia Stabs, MD;  Location: [facility];  Service:  General;  Laterality: N/A;   IR CATHETER TUBE CHANGE  09/12/2022   IR SINUS/FIST TUBE CHK-NON GI  09/18/2022   KNEE SURGERY     LAPAROSCOPIC GASTRIC SLEEVE RESECTION N/A 02/19/2022   Procedure: LAPAROSCOPIC GASTRIC SLEEVE RESECTION, HIATAL HERNIA REPAIR;  Surgeon: Adalberto Acton, MD;  Location: [facility];  Service: General;  Laterality: N/A;   PARTIAL COLECTOMY N/A 08/31/2022   Procedure: OPEN PARTIAL COLECTOMY WITH COLOSTOMY;  Surgeon: Oralee Billow, MD;  Location: [facility];  Service: General;  Laterality: N/A;   right knee replacement      TRIGGER FINGER RELEASE Right 04/2023   thumb, [facility] Dr. Marlane Silver   TUBAL LIGATION     UPPER GASTROINTESTINAL ENDOSCOPY     UPPER GI ENDOSCOPY N/A 02/19/2022   Procedure: UPPER GI ENDOSCOPY;  Surgeon: Adalberto Acton, MD;  Location: [facility];  Service: General;  Laterality: N/A;   XI ROBOTIC ASSISTED COLOSTOMY TAKEDOWN N/A 09/05/2023   Procedure: XI ROBOTIC ASSISTED COLOSTOMY REVERAL; LYSIS OF ADHESIONS, BILATERAL TAP BLOCK, REPAIR OF SMALL BOWEL X 4;  Surgeon: Melvenia Stabs, MD;  Location: [facility];  Service: General;  Laterality: N/A;    Current Outpatient Medications  Medication Sig Dispense Refill   acetaminophen  (TYLENOL ) 500 MG tablet Take 1,000 mg by mouth daily as needed for mild pain. pain      alum & mag hydroxide-simeth (MAALOX MAX) 400-400-40 MG/5ML suspension Take 5 mLs by mouth every 6 (six) hours as needed for indigestion. 355 mL 0  betamethasone valerate ointment (VALISONE) 0.1 % Apply 1 Application topically at bedtime.     diclofenac Sodium (VOLTAREN) 1 % GEL Apply 2 g topically daily as needed (pain).     dicyclomine (BENTYL) 20 MG tablet Take 20 mg by mouth daily as needed for spasms.     famotidine  (PEPCID ) 40 MG tablet Take 1 tablet (40 mg total) by mouth at bedtime. 90 tablet 0   fluconazole  (DIFLUCAN ) 150 MG tablet Take 1 tablet (150 mg total) by mouth daily as needed (yeast infection). 3 tablet 0   gabapentin  (NEURONTIN )  300 MG capsule Take 1 capsule (300 mg total) by mouth 3 (three) times daily. 90 capsule 11   lidocaine  (XYLOCAINE ) 2 % solution Use as directed 15 mLs in the mouth or throat as needed for mouth pain. 100 mL 0   methocarbamol  (ROBAXIN -750) 750 MG tablet Take 1 tablet (750 mg total) by mouth every 8 (eight) hours as needed for muscle spasms. 30 tablet 0   metroNIDAZOLE  (METROGEL ) 0.75 % vaginal gel Place 1 Applicatorful vaginally at bedtime.     Multiple Vitamins-Minerals (BARIATRIC FUSION) CHEW Chew 1 tablet by mouth daily.     NON FORMULARY Pt uses a cpap nightly     ondansetron  (ZOFRAN ) 4 MG tablet Take 1 tablet (4 mg total) by mouth every 6 (six) hours. 12 tablet 0   pantoprazole  (PROTONIX ) 40 MG tablet Take 40 mg by mouth 2 (two) times daily.     Probiotic CAPS Take 1 capsule by mouth daily. 30 capsule 0   spironolactone  (ALDACTONE ) 50 MG tablet Take 50 mg by mouth daily.     sucralfate  (CARAFATE ) 1 g tablet TAKE 1 TABLET(1 GRAM) BY MOUTH FOUR TIMES DAILY( AT BEDTIME AND WITH MEALS) 120 tablet 0   VOQUEZNA 20 MG TABS Take 20 mg by mouth daily. 56 tablet 0   No current facility-administered medications for this visit.    Allergies as of 05/15/2024 - Review Complete 05/15/2024  Allergen Reaction Noted   Benazepril Swelling 03/18/2018   Other Nausea And Vomiting 08/19/2023   Sulfa antibiotics Itching and Nausea Only 02/24/2017   Elemental sulfur Itching and Nausea Only 10/11/2015    Family History  Problem Relation Age of Onset   Hypertension Mother    Cancer Sister    Diabetes Sister    Liver disease Neg Hx    Esophageal cancer Neg Hx    Colon cancer Neg Hx    Rectal cancer Neg Hx    Stomach cancer Neg Hx    Neuropathy Neg Hx     Social History   Socioeconomic History   Marital status: Divorced    Spouse name: Not on file   Number of children: 2   Years of education: Not on file   Highest education level: Not on file  Occupational History   Occupation: retired  Tobacco  Use   Smoking status: Former   Smokeless tobacco: Never  Vaping Use   Vaping status: Never Used  Substance and Sexual Activity   Alcohol use: Yes    Comment: occasionally   Drug use: No   Sexual activity: Yes    Birth control/protection: Surgical, Post-menopausal  Other Topics Concern   Not on file  Social History Narrative   Retired    Lives alone    Social Drivers of Health   Financial Resource Strain: Low Risk  (03/10/2024)   Received from [facility]   Overall Financial Resource Strain (CARDIA)    Difficulty of  Paying Living Expenses: Not very hard  Food Insecurity: No Food Insecurity (03/10/2024)   [facility]   Hunger Vital Sign    Worried About Running Out of Food in the Last Year: Never true    Ran Out of Food in the Last Year: Never true  Transportation Needs: Unmet Transportation Needs (03/10/2024)   Received from [facility] - Transportation    Lack of Transportation (Medical): Yes    Lack of Transportation (Non-Medical): Yes  Physical Activity: Insufficiently Active (03/10/2024)   [facility]   Exercise Vital Sign    Days of Exercise per Week: 2 days    Minutes of Exercise per Session: 20 min  Stress: No Stress Concern Present (03/10/2024)   Received from [facility] - Occupational Stress Questionnaire    Feeling of Stress : Not at all  Social Connections: Socially Integrated (03/10/2024)   Received from [facility]   Social Network    How would you rate your social network (family, work, friends)?: Good participation with social networks  Intimate Partner Violence: Not At Risk (03/10/2024)   Received from [facility]   HITS    Over the last 12 months how often did your partner physically hurt you?: Never    Over the last 12 months how often did your partner insult you or talk down to you?: Never    Over the last 12 months how often did your partner threaten you with  physical harm?: Never    Over the last 12 months how often did your partner scream or curse at you?: Never    Review of Systems:    Constitutional: No weight loss, fever, chills, weakness or fatigue HEENT: Eyes: No change in vision               Ears, Nose, Throat:  No change in hearing or congestion Skin: No rash or itching Cardiovascular: No chest pain, chest pressure or palpitations   Respiratory: No SOB or cough Gastrointestinal: See HPI and otherwise negative Genitourinary: No dysuria or change in urinary frequency Neurological: No headache, dizziness or syncope Musculoskeletal: No new muscle or joint pain Hematologic: No bleeding or bruising Psychiatric: No history of depression or anxiety    Physical Exam:  Vital signs: BP 138/84 (BP Location: Left Arm, Patient Position: Sitting, Cuff Size: Large)   Pulse 84   Ht 5' 7.75" (1.721 m) Comment: Height measured without shoes  Wt 271 lb 6 oz (123.1 kg)   BMI 41.57 kg/m   Constitutional: NAD, alert and cooperative Head:  Normocephalic and atraumatic. Eyes:   PEERL, EOMI. No icterus. Conjunctiva pink. Respiratory: Respirations even and unlabored. Lungs clear to auscultation bilaterally.   No wheezes, crackles, or rhonchi.  Cardiovascular:  Regular rate and rhythm. No peripheral edema, cyanosis or pallor.  Gastrointestinal:  Soft, nondistended, nontender. No rebound or guarding. Normal bowel sounds. No appreciable masses or hepatomegaly. Rectal:  Declines Msk:  Symmetrical without gross deformities. Without edema, no deformity or joint abnormality.  Neurologic:  Alert and  oriented x4;  grossly normal neurologically.  Skin:   Dry and intact without significant lesions or rashes. Psychiatric: Oriented to person, place and time. Demonstrates good judgement and reason without abnormal affect or behaviors.  RELEVANT LABS AND IMAGING: CBC    Component Value Date/Time   WBC 4.3 03/16/2024 1136   RBC 4.53 03/16/2024 1136   HGB  13.5 03/16/2024 1136  HCT 42.0 03/16/2024 1136   PLT 194.0 03/16/2024 1136   MCV 92.7 03/16/2024 1136   MCH 29.4 11/12/2023 1314   MCHC 32.3 03/16/2024 1136   RDW 14.6 03/16/2024 1136   LYMPHSABS 1.7 03/16/2024 1136   MONOABS 0.6 03/16/2024 1136   EOSABS 0.1 03/16/2024 1136   BASOSABS 0.0 03/16/2024 1136    CMP     Component Value Date/Time   NA 142 03/16/2024 1136   K 3.8 03/16/2024 1136   CL 108 03/16/2024 1136   CO2 28 03/16/2024 1136   GLUCOSE 74 03/16/2024 1136   BUN 17 03/16/2024 1136   CREATININE 0.90 03/16/2024 1136   CALCIUM  9.2 03/16/2024 1136   PROT 7.2 03/16/2024 1136   PROT 6.9 03/11/2024 1351   ALBUMIN 4.0 03/16/2024 1136   AST 13 03/16/2024 1136   ALT 12 03/16/2024 1136   ALKPHOS 82 03/16/2024 1136   BILITOT 0.3 03/16/2024 1136   GFRNONAA 59 (L) 11/12/2023 1314   GFRAA >60 06/28/2020 0548     Assessment/Plan:   Recurrent hiatal hernia and GERD with esophagitis Previous sleeve gastrectomy and hiatal hernia repair 2023 with Dr. Jamse Mcgee 02/18/2024 upper GI recurrent small sliding hiatal hernia moderate GERD with small epiphrenic pulsion diverticulum, mild dysmotility EGD 04/21/2024: LA grade C reflux esophagitis without bleeding, 1-2 cm sliding hiatal hernia, sleeve gastrectomy, gastritis.  Increased pantoprazole  to twice daily. Persistent epigastric pain with eating and GERD despite PPI BID and carafate  TID. CT 2023 without gallstones. Suspect continued GERD, but patient would like gallbladder ruled out -- HIDA scan to evaluate for biliary dyskinesia -- failed PPI BID, carafate , and lifestyle mods. Will do trial of Voquezna 20mg  x 8 weeks and then 10mg  for maintenance.  -- if persistent GERD, consider TIF -- follow up 8 weeks with Mylinda Asa  constipation ER visit 11/12/2023 CT abdomen pelvis with contrast no specific cause of abdominal pain, small to moderate hiatal hernia containing fundoplication wrap prior sleeve gastrectomy, formed stool in the rectum On  miralax  as needed  Diverticulosis with history of diverticulitis  s/p exploratory laparotomy with sigmoid colectomy and descending colostomy 08/2022 07/04/2023 colonoscopy Dr. Karene Oto for anticipated colostomy reversal diverticulosis descending ascending colon 1 3 mm descending polyp, otherwise normal 09/05/2023 colostomy reversal with lysis of adhesions repair of small bowel  History of DVT post surgery Was on Eliquis  6 months but has stopped Not on a blood thinner No leg swelling/pain   Gigi Kyle [facility] 05/15/2024, 11:10 AM  Cc: Loree Roe, MD

## 2024-05-15 ENCOUNTER — Ambulatory Visit: Admitting: Gastroenterology

## 2024-05-15 ENCOUNTER — Encounter: Payer: Self-pay | Admitting: Gastroenterology

## 2024-05-15 VITALS — BP 138/84 | HR 84 | Ht 67.75 in | Wt 271.4 lb

## 2024-05-15 DIAGNOSIS — Z86718 Personal history of other venous thrombosis and embolism: Secondary | ICD-10-CM

## 2024-05-15 DIAGNOSIS — K5730 Diverticulosis of large intestine without perforation or abscess without bleeding: Secondary | ICD-10-CM

## 2024-05-15 DIAGNOSIS — K449 Diaphragmatic hernia without obstruction or gangrene: Secondary | ICD-10-CM

## 2024-05-15 DIAGNOSIS — K2100 Gastro-esophageal reflux disease with esophagitis, without bleeding: Secondary | ICD-10-CM

## 2024-05-15 DIAGNOSIS — K5900 Constipation, unspecified: Secondary | ICD-10-CM

## 2024-05-15 DIAGNOSIS — R1013 Epigastric pain: Secondary | ICD-10-CM

## 2024-05-15 DIAGNOSIS — Z8719 Personal history of other diseases of the digestive system: Secondary | ICD-10-CM

## 2024-05-15 MED ORDER — VOQUEZNA 20 MG PO TABS: 20.0000 mg | ORAL_TABLET | Freq: Every day | ORAL | 0 refills | Status: AC

## 2024-05-15 NOTE — Patient Instructions (Signed)
 _______________________________________________________  If your blood pressure at your visit was 140/90 or greater, please contact your primary care physician to follow up on this.  _______________________________________________________  If you are age 68 or older, your body mass index should be between 23-30. Your Body mass index is 41.57 kg/m. If this is out of the aforementioned range listed, please consider follow up with your Primary Care Provider.  If you are age 27 or younger, your body mass index should be between 19-25. Your Body mass index is 41.57 kg/m. If this is out of the aformentioned range listed, please consider follow up with your Primary Care Provider.   ________________________________________________________  The [facility] providers would like to encourage you to use MYCHART to communicate with providers for non-urgent requests or questions.  Due to long hold times on the telephone, sending your provider a message by [facility] may be a faster and more efficient way to get a response.  Please allow 48 business hours for a response.  Please remember that this is for non-urgent requests.  _______________________________________________________  We have sent the following medications to the pharmacy Voquezna 20mg  daily for 8 weeks  You have been scheduled for an appointment with Santina Cull PA-C on 07-15-24 at 10-15-24 . Please arrive 10 minutes early for your appointment.  You have been scheduled for a HIDA scan at [facility] (1st floor) on 05-28-24. Please arrive 30 minutes prior to your scheduled appointment at  [id number]. Make certain not to have anything to eat or drink at least midnight prior to your test. Should this appointment date or time not work well for you, please call radiology scheduling at [phone].  _____________________________________________________________________ hepatobiliary (HIDA) scan is an imaging procedure used to diagnose problems in  the liver, gallbladder and bile ducts. In the HIDA scan, a radioactive chemical or tracer is injected into a vein in your arm. The tracer is handled by the liver like bile. Bile is a fluid produced and excreted by your liver that helps your digestive system break down fats in the foods you eat. Bile is stored in your gallbladder and the gallbladder releases the bile when you eat a meal. A special nuclear medicine scanner (gamma camera) tracks the flow of the tracer from your liver into your gallbladder and small intestine.  During your HIDA scan  You'll be asked to change into a hospital gown before your HIDA scan begins. Your health care team will position you on a table, usually on your back. The radioactive tracer is then injected into a vein in your arm.The tracer travels through your bloodstream to your liver, where it's taken up by the bile-producing cells. The radioactive tracer travels with the bile from your liver into your gallbladder and through your bile ducts to your small intestine.You may feel some pressure while the radioactive tracer is injected into your vein. As you lie on the table, a special gamma camera is positioned over your abdomen taking pictures of the tracer as it moves through your body. The gamma camera takes pictures continually for about an hour. You'll need to keep still during the HIDA scan. This can become uncomfortable, but you may find that you can lessen the discomfort by taking deep breaths and thinking about other things. Tell your health care team if you're uncomfortable. The radiologist will watch on a computer the progress of the radioactive tracer through your body. The HIDA scan may be stopped when the radioactive tracer is seen in the gallbladder and enters  your small intestine. This typically takes about an hour. In some cases extra imaging will be performed if original images aren't satisfactory, if morphine  is given to help visualize the gallbladder or if the  medication CCK is given to look at the contraction of the gallbladder. This test typically takes 2 hours to complete. ________________________________________________________________________  It was a pleasure to see you today!  Thank you for trusting me with your gastrointestinal care!

## 2024-05-22 ENCOUNTER — Telehealth: Payer: Self-pay | Admitting: Gastroenterology

## 2024-05-22 NOTE — Telephone Encounter (Signed)
 I have spoken to patient who states that several days ago, she was very constipated and had to "strain extremely hard" to have a bowel movement. Since that time, she has had some LLQ abdominal pain (at area of previous ostomy site) she describes as soreness and this is worse with coughing or movement such as sitting to standing position change. She denies any rectal bleeding or other GI concerns. Patient states she has most recently been taking MOM and warm prune juice daily.   I have advised patient that her soreness may be musculoskeletal in nature from straining but if she continues to have pain, pain worsens or is accompanying additional symptoms such as bleeding, she should make us  aware (pt has history of diverticulitis).   I did recommend patient take Miralax  17 grams on a daily basis for the next week followed by a titrated dose based on how her bowels are at that time. In addition, I suggested she take Benefiber OTC starting at 1 teaspoon daily and working her way to 1 tablespoon daily. Increase fluid intake.

## 2024-05-22 NOTE — Telephone Encounter (Signed)
 PT is calling with concerns about the constipation she is experiencing along with the LT side pain that hurts in the area where her colostomy bag was. Please advise to discuss her concerns.

## 2024-05-28 ENCOUNTER — Ambulatory Visit (HOSPITAL_COMMUNITY)

## 2024-06-01 NOTE — Progress Notes (Signed)
 Agree with the assessment and plan as outlined by Suzanna Erp, PA-C.  Agree with plan to trial Voquenza, as high dose PPI and carafate  have been ineffective. Unfortunately, with history of gastric sleeve (along with the pulsion diverticulum), TIF not an option. If persistent reflux despite Voquenza, may need to consider referral back to surgical clinic for hernia repair.   Neeley Sedivy, DO, [facility]

## 2024-06-15 ENCOUNTER — Ambulatory Visit (HOSPITAL_COMMUNITY)
Admission: RE | Admit: 2024-06-15 | Discharge: 2024-06-15 | Disposition: A | Discharge status: Home / Self Care | Source: Ambulatory Visit | Attending: Gastroenterology | Admitting: Gastroenterology

## 2024-06-15 ENCOUNTER — Ambulatory Visit: Payer: Self-pay | Admitting: Gastroenterology

## 2024-06-15 DIAGNOSIS — R1013 Epigastric pain: Secondary | ICD-10-CM | POA: Insufficient documentation

## 2024-06-15 MED ORDER — TECHNETIUM TC 99M MEBROFENIN IV KIT
5.5000 | PACK | Freq: Once | INTRAVENOUS | Status: AC
  Administered 2024-06-15: 5.5 via INTRAVENOUS

## 2024-07-14 NOTE — Progress Notes (Deleted)
 07/14/2024 Runell Kovich Goswami [id number] 11/14/1956  Referring provider: Naomi Hitch, MD Primary GI doctor: Dr. San  ASSESSMENT AND PLAN:   Recurrent hiatal hernia and GERD She has increase GERD with any liquid/eating, having acid in the morning, having some regurg of liquids/solids, worse with large volume food/drink, denies dysphagia Previous sleeve gastrectomy and hiatal hernia repair 2023 with Dr. Cameron 02/18/2024 upper GI recurrent small sliding hiatal hernia moderate GERD with small epiphrenic pulsion diverticulum, mild dysmotility 04/21/2024 EGD LA grade C esophagitis, 1 to 2 cm sliding-type hiatal hernia, evidence of sleeve gastrectomy healthy appearing mucosa, mild inflammation gastric antrum normal duodenum.   02/18/2024 UGI Dr. Cameron recurrent small sliding hiatal hernia associated moderate GERD and small pulsatile diverticulum, new mild esophageal dysmotility 06/15/2024 HIDA negative Protonix  increased to 40 mg twice daily for 4 weeks, Can consider voquezna , GES, manometry  Abdominal pain/constipation ER visit 11/12/2023 CT abdomen pelvis with contrast no specific cause of abdominal pain, small to moderate hiatal hernia containing fundoplication wrap prior sleeve gastrectomy, formed stool in the rectum On miralax  as needed, will start daily  Diverticulosis with history of diverticulitis  s/p exploratory laparotomy with sigmoid colectomy and descending colostomy 08/2022 07/04/2023 colonoscopy Dr. San for anticipated colostomy reversal diverticulosis descending ascending colon 1 3 mm descending polyp, otherwise normal 09/05/2023 colostomy reversal with lysis of adhesions repair of small bowel 11/12/2023 CT AP W for acute pain nausea vomiting small to moderate hiatal hernia, prior sleeve gastrectomy formed stool in the rectum, no acute cause for pain Likely from constipation/IBS ER precautions discussed  History of DVT post surgery Was on Eliquis  6 months  but has stopped Not on a blood thinner No leg swelling/pain  Morbid obesity  There is no height or weight on file to calculate BMI.  -Patient has been advised to make an attempt to improve diet and exercise patterns to aid in weight loss. -Recommended diet heavy in fruits and veggies and low in animal meats, cheeses, and dairy products, appropriate calorie intake   Patient Care Team: [facility] as PCP - General (Rheumatology) Aron Shoulders, MD as Consulting Physician (General Surgery)  HISTORY OF PRESENT ILLNESS:  Discussed the use of AI scribe software for clinical note transcription with the patient, who gave verbal consent to proceed.  68 year old female with history of hypertension, OSA, GERD, diverticulitis, colon polyps, osteoarthritis, OAB, morbid obesity, depression presents for follow-up.  Patient was last seen in the office 03/16/2024 by myself for GERD with recurrent hernia.  Patient not a candidate for TIF due to history of gastric sleeve and lower esophageal diverticulum. 04/21/2024 EGD LA grade C esophagitis, 1 to 2 cm sliding-type hiatal hernia, evidence of sleeve gastrectomy healthy appearing mucosa, mild inflammation gastric antrum normal duodenum.  Protonix  increased to 40 mg twice daily for 4 weeks, follow-up Dr. Cameron bariatric surgery clinic. 05/15/2024 office visit with Con Blower to trial of Voquezna   - Needs referral back to surgery -Can consider GES, manometry   History of Present Illness          She  reports that she has quit smoking. She has never used smokeless tobacco. She reports current alcohol use. She reports that she does not use drugs.  RELEVANT GI HISTORY, IMAGING AND LABS: Results          - Colonoscopy in [place]  approximately 2011 and no polyps per patient.  Otherwise adopted and no known family history. - 11/25/2016: ER evaluation for abdominal pain.  WBC 7.6.  CT  with diverticulosis without diverticulitis.  Was  treated with antibiotics and discharged home - 12/05/2016: Evaluated at [facility] for evaluation of abdominal pain with recurrent diverticulitis and constipation.  Improvement in pain with antibiotics from the ER.  Treats constipation with stool softener. - 03/07/2017: Colonoscopy: Sigmoid diverticulosis, otherwise normal including normal TI.  Repeat in 10 years - 02/19/2022: Sleeve gastrectomy, HH repair by Dr. Signe - 08/15/2022: Follow-up appointment at [facility].  Per notes, history of recurrent diverticulitis and had apparently recommended sigmoid colectomy in 07/2020, but never completed.  Constipation managed with stool softeners.  Planning for gastric sleeve for weight loss.  Apparently had CT done prior to that appointment and diagnosed with recurrent diverticulitis again and treated with Cipro /Flagyl , but ongoing symptoms at that follow-up appointment prompting repeat CT. - 08/16/2022: CT A/P with IV contrast: Wall thickening of the sigmoid colon which may relate to residual or recurrent diverticulitis.  Recommend follow-up colonoscopy to exclude any underlying neoplastic process.  Postsurgical changes in the stomach.  Nonspecific distal esophageal thickening.  Treated with Augmentin. - 08/31/2022: Ex lap, sigmoid colectomy, descending colostomy, VAC dressing placement.  Path showing diverticulosis, diverticulitis, and micro abscesses with viable margins. - 02/05/2023: Follow-up appointment with Dr. Teresa in Colorectal Surgery.  Doing well with her colostomy with occasional constipation treated with MiraLAX .  No output from rectum.  Referred to [facility] for repeat colonoscopy prior to undergoing colostomy reversal surgery.  Also planning Gastrografin enema via anus - 02/25/2023: Unsuccessful attempted barium enema due to inability to advance either a soft Foley catheter or regular rectal tube into rectum. - 04/08/2023: Follow-up with Dr. Teresa.  DRE with no palpable mass.  Referred for  colonoscopy preoperatively. - 04/10/2023: CT A/P: Postsurgical changes of sigmoid colectomy and left-sided end colostomy.  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  No evidence of bowel obstruction, diverticulitis.  CBC    Component Value Date/Time   WBC 4.3 03/16/2024 1136   RBC 4.53 03/16/2024 1136   HGB 13.5 03/16/2024 1136   HCT 42.0 03/16/2024 1136   PLT 194.0 03/16/2024 1136   MCV 92.7 03/16/2024 1136   MCH 29.4 11/12/2023 1314   MCHC 32.3 03/16/2024 1136   RDW 14.6 03/16/2024 1136   LYMPHSABS 1.7 03/16/2024 1136   MONOABS 0.6 03/16/2024 1136   EOSABS 0.1 03/16/2024 1136   BASOSABS 0.0 03/16/2024 1136   Recent Labs    08/27/23 1134 09/06/23 0446 09/07/23 0519 09/08/23 0422 09/10/23 0826 09/11/23 0459 11/12/23 1314 03/16/24 1136  HGB 13.1 13.0 12.2 11.6* 12.7 12.1 13.3 13.5    CMP     Component Value Date/Time   NA 142 03/16/2024 1136   K 3.8 03/16/2024 1136   CL 108 03/16/2024 1136   CO2 28 03/16/2024 1136   GLUCOSE 74 03/16/2024 1136   BUN 17 03/16/2024 1136   CREATININE 0.90 03/16/2024 1136   CALCIUM  9.2 03/16/2024 1136   PROT 7.2 03/16/2024 1136   PROT 6.9 03/11/2024 1351   ALBUMIN 4.0 03/16/2024 1136   AST 13 03/16/2024 1136   ALT 12 03/16/2024 1136   ALKPHOS 82 03/16/2024 1136   BILITOT 0.3 03/16/2024 1136   GFRNONAA 59 (L) 11/12/2023 1314   GFRAA >60 06/28/2020 0548      Latest Ref Rng & Units 03/16/2024   11:36 AM 03/11/2024    1:51 PM 11/12/2023    1:14 PM  Hepatic Function  Total Protein 6.0 - 8.3 g/dL 7.2  6.9  7.4   Albumin 3.5 -  5.2 g/dL 4.0   3.8   AST 0 - 37 U/L 13   18   ALT 0 - 35 U/L 12   15   Alk Phosphatase 39 - 117 U/L 82   71   Total Bilirubin 0.2 - 1.2 mg/dL 0.3   0.9       Current Medications:    Current Outpatient Medications (Cardiovascular):    spironolactone  (ALDACTONE ) 50 MG tablet, Take 50 mg by mouth daily.   Current Outpatient Medications (Analgesics):    acetaminophen  (TYLENOL ) 500 MG tablet,  Take 1,000 mg by mouth daily as needed for mild pain. pain    Current Outpatient Medications (Other):    alum & mag hydroxide-simeth (MAALOX MAX) 400-400-40 MG/5ML suspension, Take 5 mLs by mouth every 6 (six) hours as needed for indigestion.   betamethasone valerate ointment (VALISONE) 0.1 %, Apply 1 Application topically at bedtime.   diclofenac Sodium (VOLTAREN) 1 % GEL, Apply 2 g topically daily as needed (pain).   dicyclomine (BENTYL) 20 MG tablet, Take 20 mg by mouth daily as needed for spasms.   famotidine  (PEPCID ) 40 MG tablet, Take 1 tablet (40 mg total) by mouth at bedtime.   fluconazole  (DIFLUCAN ) 150 MG tablet, Take 1 tablet (150 mg total) by mouth daily as needed (yeast infection).   gabapentin  (NEURONTIN ) 300 MG capsule, Take 1 capsule (300 mg total) by mouth 3 (three) times daily.   lidocaine  (XYLOCAINE ) 2 % solution, Use as directed 15 mLs in the mouth or throat as needed for mouth pain.   methocarbamol  (ROBAXIN -750) 750 MG tablet, Take 1 tablet (750 mg total) by mouth every 8 (eight) hours as needed for muscle spasms.   metroNIDAZOLE  (METROGEL ) 0.75 % vaginal gel, Place 1 Applicatorful vaginally at bedtime.   Multiple Vitamins-Minerals (BARIATRIC FUSION) CHEW, Chew 1 tablet by mouth daily.   NON FORMULARY, Pt uses a cpap nightly   ondansetron  (ZOFRAN ) 4 MG tablet, Take 1 tablet (4 mg total) by mouth every 6 (six) hours.   pantoprazole  (PROTONIX ) 40 MG tablet, Take 40 mg by mouth 2 (two) times daily.   Probiotic CAPS, Take 1 capsule by mouth daily.   sucralfate  (CARAFATE ) 1 g tablet, TAKE 1 TABLET(1 GRAM) BY MOUTH FOUR TIMES DAILY( AT BEDTIME AND WITH MEALS)  Medical History:  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   History of anemia    History of depression    History of diverticulitis     Hypertension    Knee pain, chronic    Lower back pain    Multilevel degenerative disc disease    Neuropathy    OSA on CPAP    Osteoarthritis    PONV (postoperative nausea and vomiting)    Pre-diabetes    Sleep apnea    cpap   Vulvovaginitis    Allergies:  Allergies  Allergen Reactions   Benazepril Swelling   Other Nausea And Vomiting    Post anesthesia   Sulfa Antibiotics Itching and Nausea Only   Elemental Sulfur Itching and Nausea Only     Surgical History:  She  has a past surgical history that includes Tubal ligation; Abdominal hysterectomy; Knee surgery; Breast reduction surgery; right knee replacement ; Laparoscopic gastric sleeve resection (N/A, 02/19/2022); Upper gi endoscopy (N/A, 02/19/2022); Partial  colectomy (N/A, 08/31/2022); IR Catheter Tube Change (09/12/2022); IR Sinus/Fist Tube Chk-Non GI (09/18/2022); Trigger finger release (Right, 04/2023); Xi robotic assisted colostomy takedown (N/A, 09/05/2023); Flexible sigmoidoscopy (N/A, 09/05/2023); Upper gastrointestinal endoscopy; and Colonoscopy. Family History:  Her family history includes Cancer in her sister; Diabetes in her sister; Hypertension in her mother.  REVIEW OF SYSTEMS  : All other systems reviewed and negative except where noted in the History of Present Illness.  PHYSICAL EXAM: There were no vitals taken for this visit. Physical Exam          Alan JONELLE Coombs, PA-C 1:03 PM

## 2024-07-15 ENCOUNTER — Ambulatory Visit: Admitting: Physician Assistant

## 2024-07-17 ENCOUNTER — Telehealth: Payer: Self-pay | Admitting: *Deleted

## 2024-07-17 MED ORDER — VOQUEZNA 20 MG PO TABS: 20.0000 mg | ORAL_TABLET | Freq: Every day | ORAL | 3 refills | Status: DC

## 2024-07-17 NOTE — Telephone Encounter (Signed)
 Request fax for Voquezna  refills from [facility]

## 2024-07-29 NOTE — Progress Notes (Deleted)
 Cardiology Office Note:    Date:  07/29/2024   ID:  Kristin Gibson, DOB 1956/12/06, MRN [id number]  PCP:  [facility]  Cardiologist:  None  Electrophysiologist:  None   Referring MD: Naomi Hitch, MD   No chief complaint on file. ***  History of Present Illness:    Kristin Gibson is a 68 y.o. female with a hx of prediabetes, hypertension, OSA, DVT who is referred by Dr. Naomi for evaluation of bradycardia and shortness of breath and hypertension.   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   History of anemia    History of depression    History of diverticulitis    Hypertension    Knee pain, chronic    Lower back pain    Multilevel degenerative disc disease    Neuropathy    OSA on CPAP    Osteoarthritis    PONV (postoperative nausea and vomiting)    Pre-diabetes    Sleep apnea    cpap   Vulvovaginitis     Past Surgical History:  Procedure Laterality Date   ABDOMINAL HYSTERECTOMY     BREAST REDUCTION SURGERY     COLONOSCOPY     FLEXIBLE SIGMOIDOSCOPY N/A 09/05/2023   Procedure: FLEXIBLE SIGMOIDOSCOPY;  Surgeon: Teresa Lonni HERO, MD;  Location: [facility];  Service: General;  Laterality: N/A;   IR CATHETER TUBE CHANGE  09/12/2022   IR SINUS/FIST TUBE CHK-NON GI  09/18/2022   KNEE SURGERY     LAPAROSCOPIC GASTRIC SLEEVE RESECTION N/A 02/19/2022   Procedure: LAPAROSCOPIC GASTRIC SLEEVE RESECTION, HIATAL HERNIA REPAIR;  Surgeon: Signe Mitzie LABOR, MD;  Location: [facility];  Service: General;  Laterality: N/A;   PARTIAL COLECTOMY N/A 08/31/2022   Procedure: OPEN PARTIAL COLECTOMY WITH COLOSTOMY;  Surgeon: Eletha Boas, MD;  Location: [facility];  Service: General;  Laterality: N/A;   right knee replacement      TRIGGER FINGER RELEASE Right 04/2023   thumb, [facility] Dr.  Zackary   TUBAL LIGATION     UPPER GASTROINTESTINAL ENDOSCOPY     UPPER GI ENDOSCOPY N/A 02/19/2022   Procedure: UPPER GI ENDOSCOPY;  Surgeon: Signe Mitzie LABOR, MD;  Location: [facility];  Service: General;  Laterality: N/A;   XI ROBOTIC ASSISTED COLOSTOMY TAKEDOWN N/A 09/05/2023   Procedure: XI ROBOTIC ASSISTED COLOSTOMY REVERAL; LYSIS OF ADHESIONS, BILATERAL TAP BLOCK, REPAIR OF SMALL BOWEL X 4;  Surgeon: Teresa Lonni HERO, MD;  Location: [facility];  Service: General;  Laterality: N/A;    Current Medications: No outpatient medications have been marked as taking for the 07/30/24 encounter (Appointment) with Kate Lonni CROME, MD.     Allergies:   Benazepril, Other, Sulfa antibiotics, and Elemental sulfur   Social History   Socioeconomic History   Marital status: Divorced    Spouse name: Not on file   Number of children: 2   Years of education: Not on file   Highest education level: Not on file  Occupational History   Occupation: retired  Tobacco Use   Smoking status: Former   Smokeless tobacco: Never  Vaping Use   Vaping status: Never Used  Substance and Sexual Activity   Alcohol use: Yes    Comment: occasionally   Drug use: No   Sexual activity: Yes  Birth control/protection: Surgical, Post-menopausal  Other Topics Concern   Not on file  Social History Narrative   Retired    Lives alone    Social Drivers of Health   Financial Resource Strain: Low Risk  (03/10/2024)   Received from [facility]   Overall Financial Resource Strain (CARDIA)    Difficulty of Paying Living Expenses: Not very hard  Food Insecurity: Low Risk  (07/07/2024)   Received from [facility]   Hunger Vital Sign    Within the past 12 months, you worried that your food would run out before you got money to buy more: Never true    Within the past 12 months, the food you bought just didn't last and you didn't have money to get more. : Never true  Transportation Needs: No Transportation Needs  (07/07/2024)   Received from [facility]    In the past 12 months, has lack of reliable transportation kept you from medical appointments, meetings, work or from getting things needed for daily living? : No  Physical Activity: Insufficiently Active (03/10/2024)   Received from [facility]   Exercise Vital Sign    On average, how many days per week do you engage in moderate to strenuous exercise (like a brisk walk)?: 2 days    On average, how many minutes do you engage in exercise at this level?: 20 min  Stress: No Stress Concern Present (03/10/2024)   Received from [facility] - Occupational Stress Questionnaire    Feeling of Stress : Not at all  Social Connections: Socially Integrated (03/10/2024)   Received from [facility]   Social Network    How would you rate your social network (family, work, friends)?: Good participation with social networks     Family History: The patient's ***family history includes Cancer in her sister; Diabetes in her sister; Hypertension in her mother. There is no history of Liver disease, Esophageal cancer, Colon cancer, Rectal cancer, Stomach cancer, or Neuropathy.  ROS:   Please see the history of present illness.    *** All other systems reviewed and are negative.  EKGs/Labs/Other Studies Reviewed:    The following studies were reviewed today: ***  EKG:  EKG is *** ordered today.  The ekg ordered today demonstrates ***  Recent Labs: 03/16/2024: ALT 12; BUN 17; Creatinine, Ser 0.90; Hemoglobin 13.5; Platelets 194.0; Potassium 3.8; Sodium 142  Recent Lipid Panel    Component Value Date/Time   TRIG 56 09/17/2022 0438    Physical Exam:    VS:  There were no vitals taken for this visit.    Wt Readings from Last 3 Encounters:  05/15/24 271 lb 6 oz (123.1 kg)  04/21/24 266 lb (120.7 kg)  04/17/24 269 lb (122 kg)     GEN: *** Well nourished, well developed in no acute  distress HEENT: Normal NECK: No JVD; No carotid bruits LYMPHATICS: No lymphadenopathy CARDIAC: ***RRR, no murmurs, rubs, gallops RESPIRATORY:  Clear to auscultation without rales, wheezing or rhonchi  ABDOMEN: Soft, non-tender, non-distended MUSCULOSKELETAL:  No edema; No deformity  SKIN: Warm and dry NEUROLOGIC:  Alert and oriented x 3 PSYCHIATRIC:  Normal affect   ASSESSMENT:    No diagnosis found. PLAN:    Bradycardia:  Dyspnea:  Hypertension: On amlodipine  5 mg daily and spironolactone  50 mg daily  OSA:  Prediabetes:  RTC in***  Medication Adjustments/Labs and Tests Ordered: Current medicines are reviewed at length with the patient  today.  Concerns regarding medicines are outlined above.  No orders of the defined types were placed in this encounter.  No orders of the defined types were placed in this encounter.   There are no Patient Instructions on file for this visit.   Signed, Lonni LITTIE Nanas, MD  07/29/2024 10:38 PM    [facility]

## 2024-07-30 ENCOUNTER — Ambulatory Visit: Admitting: Cardiology

## 2024-08-12 ENCOUNTER — Ambulatory Visit (INDEPENDENT_AMBULATORY_CARE_PROVIDER_SITE_OTHER): Admitting: Physician Assistant

## 2024-08-12 ENCOUNTER — Encounter: Payer: Self-pay | Admitting: Physician Assistant

## 2024-08-12 VITALS — BP 128/86 | HR 52 | Ht 69.0 in | Wt 272.0 lb

## 2024-08-12 DIAGNOSIS — K449 Diaphragmatic hernia without obstruction or gangrene: Secondary | ICD-10-CM

## 2024-08-12 DIAGNOSIS — K2100 Gastro-esophageal reflux disease with esophagitis, without bleeding: Secondary | ICD-10-CM | POA: Diagnosis not present

## 2024-08-12 MED ORDER — VOQUEZNA 10 MG PO TABS: 10.0000 mg | ORAL_TABLET | Freq: Every day | ORAL | 1 refills | Status: AC

## 2024-08-12 MED ORDER — AMBULATORY NON FORMULARY MEDICATION: Status: AC

## 2024-08-12 MED ORDER — FAMOTIDINE 40 MG PO TABS
40.0000 mg | ORAL_TABLET | Freq: Every day | ORAL | 0 refills | Status: AC
Start: 2024-08-12 — End: ?

## 2024-08-12 NOTE — Patient Instructions (Addendum)
 Miralax  is an osmotic laxative.  It only brings more water  into the stool.  This is safe to take daily.  Can take up to 17 gram of miralax  twice a day.  Mix with juice or coffee.  Start 1/2 capful 3-4 days and reassess your response in 3-4 days.  You can increase and decrease the dose based on your response.  Remember, it can take up to 3-4 days to take effect OR for the effects to wear off.   I often pair this with benefiber in the morning to help assure the stool is not too loose.   Can take voqezna 10 mg in the morning and pepcid  at night with the rescue therapy below  Reflux Gourmet Rescue  It is an ALGINATE THERAPY which is the only intervention that works to safeguard the esophagus by creating a protective barrier that actually stops reflux from happening. -The general directions for use are as stated on the packaging: Take 1 teaspoon (5 ml), or more as needed or as directed by your physician, after meals and before bed. -These general directions address the most common times for reflux to occur, but our Rescue products may be taken anytime. Some individuals may take our product preemptively, when they know they will suffer from reflux, or as needed - when discomfort arises. (If taken around food, it should be consumed last.) -You do not have to take 1 teaspoon (5 ml) of the product. While one teaspoon (5ml) may be the perfect average amount to relieve reflux suffering in some, others may require more or less. You may adjust the amount of Mint Chocolate Rescue and Vanilla Caramel Rescue to the lowest amount necessary to meet your individual needs to improve your quality of life. -You may dilute the product if it is too viscous for you to consume. Keep in mind, however, that the thickness of the product was formulated to provide optimal coating and protection of your throat and esophagus. Though diluting the product is possible, it may reduce the protective function and/or length of  action. -This can be used in conjunction with reflux medications and lifestyle changes.  100% ALL-NATURAL  Paraben FREE, glycerin FREE, & potassium FREE  Made entirely from all-natural ingredients considered safe for children and during pregnancy  No known side effects  All-natural flavor Gluten FREE  Allergen FREE  Vegan  Can find more information here: NameSeizer.co.nz   Small intestinal bacterial overgrowth (SIBO) occurs when there is an abnormal increase in the overall bacterial population in the small intestine -- particularly types of bacteria not commonly found in that part of the digestive tract. Small intestinal bacterial overgrowth (SIBO) commonly results when a circumstance -- such as surgery or disease -- slows the passage of food and waste products in the digestive tract, creating a breeding ground for bacteria.  Signs and symptoms of SIBO often include: Loss of appetite Abdominal pain Nausea Bloating An uncomfortable feeling of fullness after eating Diarrhea or constipation, depending on the type of gas produced  What foods trigger SIBO? While foods aren't the original cause of SIBO, certain foods do encourage the overgrowth of the wrong bacteria in your small intestine. If you're feeding them their favorite foods, they're going to grow more, and that will trigger more of your SIBO symptoms. By the same token, you can help reduce the overgrowth by starving the problematic bacteria of their favorite foods. This strategy has led to a number of proposed SIBO eating plans. The plans vary, and  so do individual results. But in general, they tend to recommend limiting carbohydrates.  These include: Sugars and sweeteners. Fruits and starchy vegetables. Dairy products. Grains.  There is a test for this we can do called a breath test, if you are positive we will treat you with an antibiotic to see if it helps.  Your symptoms are very  suspicious for this condition, as discussed, we will start you on an antibiotic to see if this helps.   You have been given a testing kit to check for small intestine bacterial overgrowth (SIBO) which is completed by a company named [employer]. Make sure to return your test in the mail using the return mailing label given to you along with the kit. The test order, your demographic and insurance information have all already been sent to the company. [facility] will collect an upfront charge of $109.00 for commercial insurance plans and $229.00 if you are paying cash. The potential remaining total after claim submission and review is $120.00. Make sure to discuss with [facility] PRIOR to having the test to see if they have gotten information from your insurance company as to how much your testing will cost out of pocket, if any. Please contact [employer] at phone number [phone] to get instructions regarding how to perform the test as our office is unable to give specific testing instructions.   We are referring you back to Dr.Connor.   Please contact [facility] to schedule an appointment: [facility]  [address]  [place], [place] [address]  [phone]  You have been scheduled for a follow up appointment on Wed, 10-28-24 with Dr. San at 9:40 am. Please arrive 10 minutes early for registration. If you need to reschedule or cancel this appointment please call [phone] as soon as possible. Thank you.   Thank you for entrusting me with your care and for choosing [facility], Alan Coombs, P.A.-C   _______________________________________________________  If your blood pressure at your visit was 140/90 or greater, please contact your primary care physician to follow up on this.  _______________________________________________________  If you are age 81 or older, your body mass index should be between 23-30.  Your Body mass index is 40.17 kg/m. If this is out of the aforementioned range listed, please consider follow up with your Primary Care Provider.  If you are age 22 or younger, your body mass index should be between 19-25. Your Body mass index is 40.17 kg/m. If this is out of the aformentioned range listed, please consider follow up with your Primary Care Provider.   ________________________________________________________  The [facility] providers would like to encourage you to use MYCHART to communicate with providers for non-urgent requests or questions.  Due to long hold times on the telephone, sending your provider a message by [facility] may be a faster and more efficient way to get a response.  Please allow 48 business hours for a response.  Please remember that this is for non-urgent requests.  _______________________________________________________  [facility] is using a team-based approach to care.  Your team is made up of your doctor and two to three APPS. Our APPS (Nurse Practitioners and Physician Assistants) work with your physician to ensure care continuity for you. They are fully qualified to address your health concerns and develop a treatment plan. They communicate directly with your gastroenterologist to care for you. Seeing the Advanced Practice Practitioners on your physician's team can help you by facilitating care more promptly, often allowing  for earlier appointments, access to diagnostic testing, procedures, and other specialty referrals.

## 2024-08-12 NOTE — Progress Notes (Signed)
 08/12/2024 Kristin Gibson [id number] 1956-09-24  Referring provider: Naomi Hitch, MD Primary GI doctor: Dr. San  ASSESSMENT AND PLAN:  Recurrent hiatal hernia and GERD She has increased GERD with any liquid/eating, having acid in the morning, having some regurg of liquids/solids, worse with large volume food/drink, denies dysphagia Previous sleeve gastrectomy and hiatal hernia repair 2023 with Dr. Cameron 02/18/2024 upper GI recurrent small sliding hiatal hernia moderate GERD with small epiphrenic pulsion diverticulum, mild dysmotility, gastric emptying normal 04/21/2024 EGD LA grade C esophagitis, 1 to 2 cm sliding-type hiatal hernia, evidence of sleeve gastrectomy healthy appearing mucosa, mild inflammation gastric antrum normal duodenum.   06/15/2024 HIDA negative Voquezna  20 mg for 8 weeks, will send in 10 mg daily, can add on pepcid  at night, will give reflux gourmet Will refer back to Dr. Cameron for evaluation of repair of hiatal hernia and or discussion about gastric bypass if symptoms do not improve  Abdominal pain/constipation ER visit 11/12/2023 CT abdomen pelvis with contrast no specific cause of abdominal pain, small to moderate hiatal hernia containing fundoplication wrap prior sleeve gastrectomy, formed stool in the rectum Has improved with benefiber/miralax  Will test for SIBO with bloating and constipation consider linzess but having daily BM at this time  Diverticulosis with history of diverticulitis  s/p exploratory laparotomy with sigmoid colectomy and descending colostomy 08/2022 07/04/2023 colonoscopy Dr. San for anticipated colostomy reversal diverticulosis descending ascending colon 1 3 mm descending polyp, otherwise normal 09/05/2023 colostomy reversal with lysis of adhesions repair of small bowel 11/12/2023 CT AP W for acute pain nausea vomiting small to moderate hiatal hernia, prior sleeve gastrectomy formed stool in the rectum, no acute  cause for pain No symptoms at this time, try to prevent constipation, continue fiber ER precautions discussed  History of DVT post surgery Was on Eliquis  6 months but has stopped Not on a blood thinner No leg swelling/pain  Morbid obesity  Body mass index is 40.17 kg/m.  -Patient has been advised to make an attempt to improve diet and exercise patterns to aid in weight loss. -Recommended diet heavy in fruits and veggies and low in animal meats, cheeses, and dairy products, appropriate calorie intake   Patient Care Team: [facility] as PCP - General (Rheumatology) Aron Shoulders, MD as Consulting Physician (General Surgery)  HISTORY OF PRESENT ILLNESS:  Discussed the use of AI scribe software for clinical note transcription with the patient, who gave verbal consent to proceed.  68 year old female with history of hypertension, OSA, GERD, diverticulitis, colon polyps, osteoarthritis, OAB, morbid obesity, depression presents for follow-up.  Patient was last seen in the office 03/16/2024 by myself for GERD with recurrent hernia.  Patient not a candidate for TIF due to history of gastric sleeve and lower esophageal diverticulum. 04/21/2024 EGD LA grade C esophagitis, 1 to 2 cm sliding-type hiatal hernia, evidence of sleeve gastrectomy healthy appearing mucosa, mild inflammation gastric antrum normal duodenum.  Protonix  increased to 40 mg twice daily for 4 weeks, follow-up Dr. Cameron bariatric surgery clinic. 05/15/2024 office visit with Con Blower to trial of Voquezna   - Needs referral back to surgery -Can consider GES, manometry   History of Present Illness   Kristin Gibson is a 68 year old female with grade C esophagitis and a sliding hiatal hernia who presents for follow-up of her esophageal condition. She was referred by Doctor Signe for evaluation of her esophageal condition.  In April, an endoscopy revealed grade C esophagitis and a 1-2 cm sliding  hiatal  hernia. In May, a trial of Voquezna  was initiated, and Betaine and famotidine  were discontinued. She reports significant improvement with Voquezna  but notes that missing a dose results in a sensation in her chest. She occasionally feels food stopping in her chest, requiring liquid to help it go down, and has experienced food coming back up twice since her last visit.  She experiences excessive gas and bloating, which she attributes to her current medication. Early satiety remains unchanged since her gastric sleeve surgery. Her constipation has improved with Miralax  and fiber supplements, resulting in good stools. She takes dicyclomine once daily, which she believes may contribute to her firm stools.  She mentions a sensation of 'squishing' in the area where her colostomy bag was, and she feels the skin is thinner there. She had a hiatal hernia repair in 2023 during her gastric sleeve surgery. She has been on Voquezna  20 mg since July. She is not diabetic but has been told she is prediabetic.      She  reports that she has quit smoking. She has never used smokeless tobacco. She reports current alcohol use. She reports that she does not use drugs.  RELEVANT GI HISTORY, IMAGING AND LABS: Results   RADIOLOGY Hiatus scan: Negative for gallbladder abnormalities Upper GI series: No abnormalities detected  DIAGNOSTIC Endoscopy: Grade C esophagitis, 1-2 cm sliding hiatal hernia (03/2024) Gastric emptying study: Normal      - Colonoscopy in [place]  approximately 2011 and no polyps per patient.  Otherwise adopted and no known family history. - 11/25/2016: ER evaluation for abdominal pain.  WBC 7.6.  CT with diverticulosis without diverticulitis.  Was treated with antibiotics and discharged home - 12/05/2016: Evaluated at [facility] for evaluation of abdominal pain with recurrent diverticulitis and constipation.  Improvement in pain with antibiotics from the ER.  Treats constipation with  stool softener. - 03/07/2017: Colonoscopy: Sigmoid diverticulosis, otherwise normal including normal TI.  Repeat in 10 years - 02/19/2022: Sleeve gastrectomy, HH repair by Dr. Signe - 08/15/2022: Follow-up appointment at [facility].  Per notes, history of recurrent diverticulitis and had apparently recommended sigmoid colectomy in 07/2020, but never completed.  Constipation managed with stool softeners.  Planning for gastric sleeve for weight loss.  Apparently had CT done prior to that appointment and diagnosed with recurrent diverticulitis again and treated with Cipro /Flagyl , but ongoing symptoms at that follow-up appointment prompting repeat CT. - 08/16/2022: CT A/P with IV contrast: Wall thickening of the sigmoid colon which may relate to residual or recurrent diverticulitis.  Recommend follow-up colonoscopy to exclude any underlying neoplastic process.  Postsurgical changes in the stomach.  Nonspecific distal esophageal thickening.  Treated with Augmentin. - 08/31/2022: Ex lap, sigmoid colectomy, descending colostomy, VAC dressing placement.  Path showing diverticulosis, diverticulitis, and micro abscesses with viable margins. - 02/05/2023: Follow-up appointment with Dr. Teresa in Colorectal Surgery.  Doing well with her colostomy with occasional constipation treated with MiraLAX .  No output from rectum.  Referred to [facility] for repeat colonoscopy prior to undergoing colostomy reversal surgery.  Also planning Gastrografin enema via anus - 02/25/2023: Unsuccessful attempted barium enema due to inability to advance either a soft Foley catheter or regular rectal tube into rectum. - 04/08/2023: Follow-up with Dr. Teresa.  DRE with no palpable mass.  Referred for colonoscopy preoperatively. - 04/10/2023: CT A/P: Postsurgical changes of sigmoid colectomy and left-sided end colostomy.  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  No evidence of bowel obstruction, diverticulitis.  CBC  Component Value  Date/Time   WBC 4.3 03/16/2024 1136   RBC 4.53 03/16/2024 1136   HGB 13.5 03/16/2024 1136   HCT 42.0 03/16/2024 1136   PLT 194.0 03/16/2024 1136   MCV 92.7 03/16/2024 1136   MCH 29.4 11/12/2023 1314   MCHC 32.3 03/16/2024 1136   RDW 14.6 03/16/2024 1136   LYMPHSABS 1.7 03/16/2024 1136   MONOABS 0.6 03/16/2024 1136   EOSABS 0.1 03/16/2024 1136   BASOSABS 0.0 03/16/2024 1136   Recent Labs    08/27/23 1134 09/06/23 0446 09/07/23 0519 09/08/23 0422 09/10/23 0826 09/11/23 0459 11/12/23 1314 03/16/24 1136  HGB 13.1 13.0 12.2 11.6* 12.7 12.1 13.3 13.5    CMP     Component Value Date/Time   NA 142 03/16/2024 1136   K 3.8 03/16/2024 1136   CL 108 03/16/2024 1136   CO2 28 03/16/2024 1136   GLUCOSE 74 03/16/2024 1136   BUN 17 03/16/2024 1136   CREATININE 0.90 03/16/2024 1136   CALCIUM  9.2 03/16/2024 1136   PROT 7.2 03/16/2024 1136   PROT 6.9 03/11/2024 1351   ALBUMIN 4.0 03/16/2024 1136   AST 13 03/16/2024 1136   ALT 12 03/16/2024 1136   ALKPHOS 82 03/16/2024 1136   BILITOT 0.3 03/16/2024 1136   GFRNONAA 59 (L) 11/12/2023 1314   GFRAA >60 06/28/2020 0548      Latest Ref Rng & Units 03/16/2024   11:36 AM 03/11/2024    1:51 PM 11/12/2023    1:14 PM  Hepatic Function  Total Protein 6.0 - 8.3 g/dL 7.2  6.9  7.4   Albumin 3.5 - 5.2 g/dL 4.0   3.8   AST 0 - 37 U/L 13   18   ALT 0 - 35 U/L 12   15   Alk Phosphatase 39 - 117 U/L 82   71   Total Bilirubin 0.2 - 1.2 mg/dL 0.3   0.9       Current Medications:    Current Outpatient Medications (Cardiovascular):    spironolactone  (ALDACTONE ) 50 MG tablet, Take 50 mg by mouth daily.   Current Outpatient Medications (Analgesics):    acetaminophen  (TYLENOL ) 500 MG tablet, Take 1,000 mg by mouth daily as needed for mild pain. pain    Current Outpatient Medications (Other):    alum & mag hydroxide-simeth (MAALOX MAX) 400-400-40 MG/5ML suspension, Take 5 mLs by mouth every 6 (six) hours as needed for indigestion.    AMBULATORY NON FORMULARY MEDICATION, Medication Name: Reflux Gourmet: Take 1 teaspoon (5ml) or more as needed after meals and before bed   betamethasone valerate ointment (VALISONE) 0.1 %, Apply 1 Application topically at bedtime.   diclofenac Sodium (VOLTAREN) 1 % GEL, Apply 2 g topically daily as needed (pain).   dicyclomine (BENTYL) 20 MG tablet, Take 20 mg by mouth daily as needed for spasms.   famotidine  (PEPCID ) 40 MG tablet, Take 1 tablet (40 mg total) by mouth at bedtime.   fluconazole  (DIFLUCAN ) 150 MG tablet, Take 1 tablet (150 mg total) by mouth daily as needed (yeast infection).   gabapentin  (NEURONTIN ) 300 MG capsule, Take 1 capsule (300 mg total) by mouth 3 (three) times daily.   lidocaine  (XYLOCAINE ) 2 % solution, Use as directed 15 mLs in the mouth or throat as needed for mouth pain.   methocarbamol  (ROBAXIN -750) 750 MG tablet, Take 1 tablet (750 mg total) by mouth every 8 (eight) hours as needed for muscle spasms.   metroNIDAZOLE  (METROGEL ) 0.75 % vaginal gel, Place 1 Applicatorful vaginally  at bedtime.   Multiple Vitamins-Minerals (BARIATRIC FUSION) CHEW, Chew 1 tablet by mouth daily.   NON FORMULARY, Pt uses a cpap nightly   ondansetron  (ZOFRAN ) 4 MG tablet, Take 1 tablet (4 mg total) by mouth every 6 (six) hours.   Probiotic CAPS, Take 1 capsule by mouth daily.   sucralfate  (CARAFATE ) 1 g tablet, TAKE 1 TABLET(1 GRAM) BY MOUTH FOUR TIMES DAILY( AT BEDTIME AND WITH MEALS)   VOQUEZNA  10 MG TABS, Take 10 mg by mouth daily.  Medical History:  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   History of anemia    History of depression    History of diverticulitis    Hypertension    Knee pain, chronic    Lower back pain    Multilevel degenerative disc disease    Neuropathy    OSA on CPAP    Osteoarthritis     PONV (postoperative nausea and vomiting)    Pre-diabetes    Sleep apnea    cpap   Vulvovaginitis    Wrist fracture    Allergies:  Allergies  Allergen Reactions   Benazepril Swelling   Other Nausea And Vomiting    Post anesthesia   Sulfa Antibiotics Itching and Nausea Only   Elemental Sulfur Itching and Nausea Only     Surgical History:  She  has a past surgical history that includes Tubal ligation; Abdominal hysterectomy; Knee surgery; Breast reduction surgery; right knee replacement ; Laparoscopic gastric sleeve resection (N/A, 02/19/2022); Upper gi endoscopy (N/A, 02/19/2022); Partial colectomy (N/A, 08/31/2022); IR Catheter Tube Change (09/12/2022); IR Sinus/Fist Tube Chk-Non GI (09/18/2022); Trigger finger release (Right, 04/2023); Xi robotic assisted colostomy takedown (N/A, 09/05/2023); Flexible sigmoidoscopy (N/A, 09/05/2023); Upper gastrointestinal endoscopy; and Colonoscopy. Family History:  Her family history includes Cancer in her sister; Diabetes in her sister; Hypertension in her mother.  REVIEW OF SYSTEMS  : All other systems reviewed and negative except where noted in the History of Present Illness.  PHYSICAL EXAM: BP 128/86   Pulse (!) 52   Ht 5' 9 (1.753 m)   Wt 272 lb (123.4 kg)   BMI 40.17 kg/m  Physical Exam   GENERAL APPEARANCE: Well nourished, in no apparent distress. HEENT: No cervical lymphadenopathy, unremarkable thyroid, sclerae anicteric, conjunctiva pink. RESPIRATORY: Respiratory effort normal, breath sounds equal bilaterally without rales, rhonchi, wheezing. Lungs clear to auscultation bilaterally. CARDIO: Regular rate and rhythm with no murmurs, rubs, or gallops, peripheral pulses intact. ABDOMEN: Soft, non-distended, active bowel sounds in all four quadrants, mild abdominal tenderness, no rebound, no mass appreciated. RECTAL: Declines. MUSCULOSKELETAL: Full range of motion, normal gait, without edema. SKIN: Dry, intact without rashes or  lesions. No jaundice. NEURO: Alert, oriented, no focal deficits. PSYCH: Cooperative, normal mood and affect.      Alan JONELLE Coombs, PA-C 3:42 PM

## 2024-09-27 NOTE — Progress Notes (Deleted)
 Cardiology Office Note:    Date:  09/27/2024   ID:  Kristin Gibson, DOB 10-05-1956, MRN [id number]  PCP:  [facility]  Cardiologist:  None  Electrophysiologist:  None   Referring MD: Kristin Hitch, MD   No chief complaint on file. ***  History of Present Illness:    Kristin Gibson is a 68 y.o. female with a hx of diabetes, DVT hypertension, OSA, prediabetes who is referred by Dr. Naomi for evaluation of bradycardia.  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   History of anemia    History of depression    History of diverticulitis    Hypertension    Knee pain, chronic    Lower back pain    Multilevel degenerative disc disease    Neuropathy    OSA on CPAP    Osteoarthritis    PONV (postoperative nausea and vomiting)    Pre-diabetes    Sleep apnea    cpap   Vulvovaginitis    Wrist fracture     Past Surgical History:  Procedure Laterality Date   ABDOMINAL HYSTERECTOMY     BREAST REDUCTION SURGERY     COLONOSCOPY     FLEXIBLE SIGMOIDOSCOPY N/A 09/05/2023   Procedure: FLEXIBLE SIGMOIDOSCOPY;  Surgeon: Kristin Lonni HERO, MD;  Location: [facility];  Service: General;  Laterality: N/A;   IR CATHETER TUBE CHANGE  09/12/2022   IR SINUS/FIST TUBE CHK-NON GI  09/18/2022   KNEE SURGERY     LAPAROSCOPIC GASTRIC SLEEVE RESECTION N/A 02/19/2022   Procedure: LAPAROSCOPIC GASTRIC SLEEVE RESECTION, HIATAL HERNIA REPAIR;  Surgeon: Kristin Mitzie LABOR, MD;  Location: [facility];  Service: General;  Laterality: N/A;   PARTIAL COLECTOMY N/A 08/31/2022   Procedure: OPEN PARTIAL COLECTOMY WITH COLOSTOMY;  Surgeon: Kristin Boas, MD;  Location: [facility];  Service: General;  Laterality: N/A;   right knee replacement      TRIGGER FINGER RELEASE Right 04/2023   thumb, [facility] Dr. Zackary   TUBAL  LIGATION     UPPER GASTROINTESTINAL ENDOSCOPY     UPPER GI ENDOSCOPY N/A 02/19/2022   Procedure: UPPER GI ENDOSCOPY;  Surgeon: Kristin Mitzie LABOR, MD;  Location: [facility];  Service: General;  Laterality: N/A;   XI ROBOTIC ASSISTED COLOSTOMY TAKEDOWN N/A 09/05/2023   Procedure: XI ROBOTIC ASSISTED COLOSTOMY REVERAL; LYSIS OF ADHESIONS, BILATERAL TAP BLOCK, REPAIR OF SMALL BOWEL X 4;  Surgeon: Kristin Lonni HERO, MD;  Location: [facility];  Service: General;  Laterality: N/A;    Current Medications: No outpatient medications have been marked as taking for the 10/02/24 encounter (Appointment) with Kristin Lonni CROME, MD.     Allergies:   Benazepril, Other, Sulfa antibiotics, and Elemental sulfur   Social History   Socioeconomic History   Marital status: Divorced    Spouse name: Not on file   Number of children: 2   Years of education: Not on file   Highest education level: Not on file  Occupational History   Occupation: retired  Tobacco Use   Smoking status: Former   Smokeless tobacco: Never  Vaping Use   Vaping status: Never Used  Substance and Sexual Activity   Alcohol use: Yes    Comment: occasionally   Drug use: No   Sexual activity: Yes  Birth control/protection: Surgical, Post-menopausal  Other Topics Concern   Not on file  Social History Narrative   Retired    Lives alone    Social Drivers of Health   Financial Resource Strain: Low Risk  (03/10/2024)   Received from [facility]   Overall Financial Resource Strain (CARDIA)    Difficulty of Paying Living Expenses: Not very hard  Food Insecurity: Low Risk  (07/31/2024)   Received from [facility]   Hunger Vital Sign    Within the past 12 months, you worried that your food would run out before you got money to buy more: Never true    Within the past 12 months, the food you bought just didn't last and you didn't have money to get more. : Never true  Transportation Needs: No Transportation Needs (07/31/2024)    Received from [facility]    In the past 12 months, has lack of reliable transportation kept you from medical appointments, meetings, work or from getting things needed for daily living? : No  Physical Activity: Insufficiently Active (03/10/2024)   Received from [facility]   Exercise Vital Sign    On average, how many days per week do you engage in moderate to strenuous exercise (like a brisk walk)?: 2 days    On average, how many minutes do you engage in exercise at this level?: 20 min  Stress: No Stress Concern Present (03/10/2024)   Received from [facility] - Occupational Stress Questionnaire    Feeling of Stress : Not at all  Social Connections: Socially Integrated (03/10/2024)   Received from [facility]   Social Network    How would you rate your social network (family, work, friends)?: Good participation with social networks     Family History: The patient's ***family history includes Cancer in her sister; Diabetes in her sister; Hypertension in her mother. There is no history of Liver disease, Esophageal cancer, Colon cancer, Rectal cancer, Stomach cancer, or Neuropathy.  ROS:   Please see the history of present illness.    *** All other systems reviewed and are negative.  EKGs/Labs/Other Studies Reviewed:    The following studies were reviewed today: ***  EKG:  EKG is *** ordered today.  The ekg ordered today demonstrates ***  Recent Labs: 03/16/2024: ALT 12; BUN 17; Creatinine, Ser 0.90; Hemoglobin 13.5; Platelets 194.0; Potassium 3.8; Sodium 142  Recent Lipid Panel    Component Value Date/Time   TRIG 56 09/17/2022 0438    Physical Exam:    VS:  There were no vitals taken for this visit.    Wt Readings from Last 3 Encounters:  08/12/24 272 lb (123.4 kg)  05/15/24 271 lb 6 oz (123.1 kg)  04/21/24 266 lb (120.7 kg)     GEN: *** Well nourished, well developed in no acute distress HEENT:  Normal NECK: No JVD; No carotid bruits LYMPHATICS: No lymphadenopathy CARDIAC: ***RRR, no murmurs, rubs, gallops RESPIRATORY:  Clear to auscultation without rales, wheezing or rhonchi  ABDOMEN: Soft, non-tender, non-distended MUSCULOSKELETAL:  No edema; No deformity  SKIN: Warm and dry NEUROLOGIC:  Alert and oriented x 3 PSYCHIATRIC:  Normal affect   ASSESSMENT:    No diagnosis found. PLAN:    Hypertension: On amlodipine  10 daily and spironolactone  50 mg daily  Bradycardia:  OSA: On CPAP  H/o DVT:   RTC in***   Medication Adjustments/Labs and Tests Ordered: Current medicines are reviewed at length with  the patient today.  Concerns regarding medicines are outlined above.  No orders of the defined types were placed in this encounter.  No orders of the defined types were placed in this encounter.   There are no Patient Instructions on file for this visit.   Signed, Lonni LITTIE Nanas, MD  09/27/2024 4:06 PM    [facility]

## 2024-10-02 ENCOUNTER — Ambulatory Visit: Admitting: Cardiology

## 2024-10-07 ENCOUNTER — Encounter (HOSPITAL_COMMUNITY): Payer: Self-pay | Admitting: *Deleted

## 2024-10-07 ENCOUNTER — Other Ambulatory Visit: Payer: Self-pay

## 2024-10-07 ENCOUNTER — Emergency Department (HOSPITAL_COMMUNITY): Admission: EM | Admit: 2024-10-07 | Discharge: 2024-10-07 | Disposition: A | Discharge status: Home / Self Care

## 2024-10-07 ENCOUNTER — Emergency Department (HOSPITAL_COMMUNITY)

## 2024-10-07 DIAGNOSIS — Z79899 Other long term (current) drug therapy: Secondary | ICD-10-CM | POA: Diagnosis not present

## 2024-10-07 DIAGNOSIS — R519 Headache, unspecified: Secondary | ICD-10-CM | POA: Diagnosis present

## 2024-10-07 DIAGNOSIS — I10 Essential (primary) hypertension: Secondary | ICD-10-CM | POA: Insufficient documentation

## 2024-10-07 DIAGNOSIS — E119 Type 2 diabetes mellitus without complications: Secondary | ICD-10-CM | POA: Insufficient documentation

## 2024-10-07 LAB — COMPREHENSIVE METABOLIC PANEL WITH GFR
ALT: 15 U/L (ref 0–44)
AST: 20 U/L (ref 15–41)
Albumin: 3.4 g/dL — ABNORMAL LOW (ref 3.5–5.0)
Alkaline Phosphatase: 83 U/L (ref 38–126)
Anion gap: 9 (ref 5–15)
BUN: 15 mg/dL (ref 8–23)
CO2: 24 mmol/L (ref 22–32)
Calcium: 8.7 mg/dL — ABNORMAL LOW (ref 8.9–10.3)
Chloride: 107 mmol/L (ref 98–111)
Creatinine, Ser: 1.39 mg/dL — ABNORMAL HIGH (ref 0.44–1.00)
GFR, Estimated: 41 mL/min — ABNORMAL LOW (ref >60)
Glucose, Bld: 91 mg/dL (ref 70–99)
Potassium: 4.3 mmol/L (ref 3.5–5.1)
Sodium: 140 mmol/L (ref 135–145)
Total Bilirubin: 0.5 mg/dL (ref 0.0–1.2)
Total Protein: 6.8 g/dL (ref 6.5–8.1)

## 2024-10-07 LAB — CBC WITH DIFFERENTIAL/PLATELET
Abs Immature Granulocytes: 0.01 K/uL (ref 0.00–0.07)
Basophils Absolute: 0 K/uL (ref 0.0–0.1)
Basophils Relative: 1 %
Eosinophils Absolute: 0.1 K/uL (ref 0.0–0.5)
Eosinophils Relative: 1 %
HCT: 41.4 % (ref 36.0–46.0)
Hemoglobin: 12.8 g/dL (ref 12.0–15.0)
Immature Granulocytes: 0 %
Lymphocytes Relative: 39 %
Lymphs Abs: 1.6 K/uL (ref 0.7–4.0)
MCH: 28.9 pg (ref 26.0–34.0)
MCHC: 30.9 g/dL (ref 30.0–36.0)
MCV: 93.5 fL (ref 80.0–100.0)
Monocytes Absolute: 0.4 K/uL (ref 0.1–1.0)
Monocytes Relative: 9 %
Neutro Abs: 2.1 K/uL (ref 1.7–7.7)
Neutrophils Relative %: 50 %
Platelets: 192 K/uL (ref 150–400)
RBC: 4.43 MIL/uL (ref 3.87–5.11)
RDW: 14.4 % (ref 11.5–15.5)
WBC: 4.1 K/uL (ref 4.0–10.5)
nRBC: 0 % (ref 0.0–0.2)

## 2024-10-07 LAB — SEDIMENTATION RATE: Sed Rate: 15 mm/h (ref 0–22)

## 2024-10-07 NOTE — ED Provider Notes (Signed)
 [place] EMERGENCY DEPARTMENT AT [facility] Provider Note   CSN: [id number] Arrival date & time: 10/07/24  1036     Patient presents with: Headache   Kristin Gibson is a 68 y.o. female.   Patient with history of T2DM, hypertension, OSA on CPAP presents today with complaints of headache. Reports that symptoms began 3 days ago when she was laying down to go to bed. Reports she felt a shocking pain that started in the back of the left side of her head and radiated to the left side of her face. Reports this lasted a few seconds and then went away, however she was left with a dull headache on the left side. Reports that she woke up the next morning without a headache, felt normal, however when she went to go about her day she felt the shocking pain followed by the dull headache again. Since then she has had a constant dull headache followed by 10 or less episodes of shocking pain per day.  Nothing that she has noticed triggers these headaches.  She went to her pcp and was evaluated, told to see ophthalmology which she did yesterday who did not see anything abnormal on his exam. Labs were ordered with concern for temporal arteritis, she has not heard back for these results.  Reports that her symptoms persisted and so she presents for evaluation. Denies any history of similar. No recent head trauma or medication changes. Denies light or sound sensitivity. No vision changes, her face is not sensitive to touch. No recent illness.  No fevers, chills, nausea, or vomiting.  The history is provided by the patient. No language interpreter was used.  Headache      Prior to Admission medications   Medication Sig Start Date End Date Taking? Authorizing Provider  acetaminophen  (TYLENOL ) 500 MG tablet Take 1,000 mg by mouth daily as needed for mild pain. pain     [provider]  alum & mag hydroxide-simeth (MAALOX MAX) 400-400-40 MG/5ML suspension Take 5 mLs by mouth every 6  (six) hours as needed for indigestion. 11/12/23   Henderly, Britni A, PA-C  AMBULATORY NON FORMULARY MEDICATION Medication Name: Reflux Gourmet: Take 1 teaspoon (5ml) or more as needed after meals and before bed 08/12/24   Craig Palma R, PA-C  betamethasone valerate ointment (VALISONE) 0.1 % Apply 1 Application topically at bedtime. 05/13/24   [provider]  diclofenac Sodium (VOLTAREN) 1 % GEL Apply 2 g topically daily as needed (pain).    [provider]  dicyclomine (BENTYL) 20 MG tablet Take 20 mg by mouth daily as needed for spasms. 12/05/16   [provider]  famotidine  (PEPCID ) 40 MG tablet Take 1 tablet (40 mg total) by mouth at bedtime. 08/12/24   Craig Palma SAUNDERS, PA-C  fluconazole  (DIFLUCAN ) 150 MG tablet Take 1 tablet (150 mg total) by mouth daily as needed (yeast infection). 09/20/22   Meuth, Brooke A, PA-C  gabapentin  (NEURONTIN ) 300 MG capsule Take 1 capsule (300 mg total) by mouth 3 (three) times daily. 03/11/24   Sater, Charlie LABOR, MD  lidocaine  (XYLOCAINE ) 2 % solution Use as directed 15 mLs in the mouth or throat as needed for mouth pain. 11/12/23   Henderly, Britni A, PA-C  methocarbamol  (ROBAXIN -750) 750 MG tablet Take 1 tablet (750 mg total) by mouth every 8 (eight) hours as needed for muscle spasms. 09/11/23   Teresa Lonni HERO, MD  metroNIDAZOLE  (METROGEL ) 0.75 % vaginal gel Place 1 Applicatorful vaginally at  bedtime. 05/14/24   [provider]  Multiple Vitamins-Minerals (BARIATRIC FUSION) CHEW Chew 1 tablet by mouth daily.    [provider]  NON FORMULARY Pt uses a cpap nightly    [provider]  ondansetron  (ZOFRAN ) 4 MG tablet Take 1 tablet (4 mg total) by mouth every 6 (six) hours. 04/10/23   Jerral Meth, MD  Probiotic CAPS Take 1 capsule by mouth daily. 06/28/20   Ward, Josette SAILOR, DO  spironolactone  (ALDACTONE ) 50 MG tablet Take 50 mg by mouth daily. 04/20/21   [provider]  sucralfate  (CARAFATE ) 1 g  tablet TAKE 1 TABLET(1 GRAM) BY MOUTH FOUR TIMES DAILY( AT BEDTIME AND WITH MEALS) 04/22/24   Craig Alan SAUNDERS, PA-C  VOQUEZNA  10 MG TABS Take 10 mg by mouth daily. 08/12/24 11/10/24  Craig Alan SAUNDERS, PA-C    Allergies: Benazepril, Other, Sulfa antibiotics, and Elemental sulfur    Review of Systems  Neurological:  Positive for headaches.  All other systems reviewed and are negative.   Updated Vital Signs BP 122/85 (BP Location: Right Arm)   Pulse (!) 59   Temp 98.4 F (36.9 C) (Oral)   Resp 16   Ht 5' 9 (1.753 m)   Wt 123.4 kg   SpO2 96%   BMI 40.17 kg/m   Physical Exam Vitals and nursing note reviewed.  Constitutional:      General: She is not in acute distress.    Appearance: Normal appearance. She is normal weight. She is not ill-appearing, toxic-appearing or diaphoretic.  HENT:     Head: Normocephalic and atraumatic.     Comments: No tenderness to palpation of the face  Neck:     Comments: No meningismus Cardiovascular:     Rate and Rhythm: Normal rate.  Pulmonary:     Effort: Pulmonary effort is normal. No respiratory distress.  Musculoskeletal:        General: Normal range of motion.     Cervical back: Normal range of motion.  Skin:    General: Skin is warm and dry.  Neurological:     General: No focal deficit present.     Mental Status: She is alert and oriented to person, place, and time.     GCS: GCS eye subscore is 4. GCS verbal subscore is 5. GCS motor subscore is 6.     Sensory: Sensation is intact.     Motor: Motor function is intact.     Coordination: Coordination is intact.     Gait: Gait is intact.     Comments: Alert and oriented to self, place, time and event.    Speech is fluent, clear without dysarthria or dysphasia.    Strength 5/5 in upper/lower extremities   Sensation intact in upper/lower extremities    CN I not tested  CN II grossly intact visual fields bilaterally. Did not visualize posterior eye.  CN III, IV, VI PERRLA and EOMs  intact bilaterally  CN V Intact sensation to sharp and light touch to the face  CN VII facial movements symmetric  CN VIII not tested  CN IX, X no uvula deviation, symmetric rise of soft palate  CN XI 5/5 SCM and trapezius strength bilaterally  CN XII Midline tongue protrusion, symmetric L/R movements   Psychiatric:        Mood and Affect: Mood normal.        Behavior: Behavior normal.     (all labs ordered are listed, but only abnormal results are displayed) Labs Reviewed  COMPREHENSIVE METABOLIC PANEL WITH GFR - Abnormal; Notable for the following components:      Result Value   Creatinine, Ser 1.39 (*)    Calcium  8.7 (*)    Albumin 3.4 (*)    GFR, Estimated 41 (*)    All other components within normal limits  CBC WITH DIFFERENTIAL/PLATELET  SEDIMENTATION RATE    EKG: None  Radiology: CT Head Wo Contrast Result Date: 10/07/2024 CLINICAL DATA:  Headache, new onset (Age >= 51y). EXAM: CT HEAD WITHOUT CONTRAST TECHNIQUE: Contiguous axial images were obtained from the base of the skull through the vertex without intravenous contrast. RADIATION DOSE REDUCTION: This exam was performed according to the departmental dose-optimization program which includes automated exposure control, adjustment of the mA and/or kV according to patient size and/or use of iterative reconstruction technique. COMPARISON:  None Available. FINDINGS: Brain: There is no evidence of an acute infarct, intracranial hemorrhage, mass, midline shift, or extra-axial fluid collection. Cerebral volume is normal for age. The ventricles are normal in size. The cerebellar tonsils are normally positioned. Vascular: No hyperdense vessel. Skull: No fracture or suspicious lesion. Sinuses/Orbits: Visualized paranasal sinuses and mastoid air cells are clear. Unremarkable orbits. Other: None. IMPRESSION: Negative head CT. Electronically Signed   By: Dasie Hamburg M.D.   On: 10/07/2024 13:24     Procedures   Medications Ordered  in the ED - No data to display                                  Medical Decision Making Amount and/or Complexity of Data Reviewed Labs: ordered.   This patient is a 68 y.o. female who presents to the ED for concern of headache, this involves an extensive number of treatment options, and is a complaint that carries with it a high risk of complications and morbidity. The emergent differential diagnosis prior to evaluation includes, but is not limited to,  Emergent considerations for headache include subarachnoid hemorrhage, meningitis, temporal arteritis, glaucoma, cerebral ischemia, carotid/vertebral dissection, intracranial tumor, Venous sinus thrombosis, carbon monoxide poisoning, acute or chronic subdural hemorrhage.  Other considerations include: Migraine, Cluster headache, Hypertension, Caffeine, alcohol, or drug withdrawal, Pseudotumor cerebri, Arteriovenous malformation, Head injury, Neurocysticercosis, Post-lumbar puncture, Preeclampsia, Tension headache, Sinusitis, Cervical arthritis, Refractive error causing strain, Dental abscess, Otitis media, Temporomandibular joint syndrome, Trigeminal neuralgia, Glossopharyngeal neuralgia.  This is not an exhaustive differential.   Past Medical History / Co-morbidities / Social History:  has a past medical history of Arthritis, Back pain, Bradycardia, Diverticulitis, DM (diabetes mellitus) (HCC), GERD (gastroesophageal reflux disease), Hernia due to colostomy (HCC) (03/2023), History of anemia, History of depression, History of diverticulitis, Hypertension, Knee pain, chronic, Lower back pain, Multilevel degenerative disc disease, Neuropathy, OSA on CPAP, Osteoarthritis, PONV (postoperative nausea and vomiting), Pre-diabetes, Sleep apnea, Vulvovaginitis, and Wrist fracture.  Additional history: Chart reviewed. Pertinent results include: patient seen by ophthalmology yesterday Dr. Octavia, labs were ordered, normal CMP, ESR, and CRP  Physical  Exam: Physical exam performed. The pertinent findings include: Alert and oriented and neurologically intact without focal deficits  Lab Tests: I ordered, and personally interpreted labs.  The pertinent results include:  Creatinine 1.39 (up from 0.90 6 months ago), ESR WNL.  No other acute laboratory abnormalities   Imaging Studies: I ordered imaging studies including CT head. I independently visualized and interpreted imaging which showed negative. I agree with the radiologist interpretation.  Consultations Obtained: I requested consultation with the neurology  on call,  however did not hear back before patient opted to leave against medical advise   Disposition:  Patients work-up is benign, awaiting neurology consultation, however prior to hearing back patient opted to leave AGAINST MEDICAL ADVICE. We discussed the nature and purpose, risks and benefits, as well as, the alternatives of treatment. Time was given to allow the opportunity to ask questions and consider their options, and after the discussion, the patient decided to refuse the offerred treatment. The patient was informed that refusal could lead to, but was not limited to, death, permanent disability, or severe pain. If present, I asked the relatives or significant others to dissuade them without success. Prior to refusing, I determined that the patient had the capacity to make their decision and understood the consequences of that decision. After refusal, I made every reasonable opportunity to treat them to the best of my ability.  The patient was notified that they may return to the emergency department at any time for further treatment.    Did discuss with her that I have concern for trigeminal neuralgia, however suspect she would likely benefit from advanced imaging such as CTA or MRI, however patient continues to request to leave. Will provide outpatient neurology referral for follow-up. Return precautions recommended and  discussed  Findings and plan of care discussed with supervising physician Dr. Neysa who is in agreement.   Final diagnoses:  Bad headache    ED Discharge Orders     None     An After Visit Summary was printed and given to the patient.      Nora Lauraine LABOR, PA-C 10/07/24 1948    Neysa Caron PARAS, DO 10/07/24 2343

## 2024-10-07 NOTE — ED Provider Triage Note (Signed)
 Emergency Medicine Provider Triage Evaluation Note  Kristin Gibson , a 68 y.o. female  was evaluated in triage.  Pt complains of left sided HA.  Review of Systems  Positive: Sharp, brief episodes  Negative: Nausea, vomiting, vision change  Physical Exam  BP 132/77 (BP Location: Right Arm)   Pulse 63   Temp 98.5 F (36.9 C)   Resp 17   Ht 5' 9 (1.753 m)   Wt 123.4 kg   SpO2 96%   BMI 40.17 kg/m  Gen:   Awake, no distress   Resp:  Normal effort  MSK:   Moves extremities without difficulty  Other:    Medical Decision Making  Medically screening exam initiated at 12:16 PM.  Appropriate orders placed.  Kristin Gibson was informed that the remainder of the evaluation will be completed by another provider, this initial triage assessment does not replace that evaluation, and the importance of remaining in the ED until their evaluation is complete.  Left sided headache described as sharp, shooting pain from occiput to left face. Episodes are brief, seconds, before resolving. Going on for 3 days. Saw ophthalmology yesterday Kristin Gibson) - pt reports normal eye exam. Saw PCP also - LABS ARE AVAILABLE FOR REVIEW. No sed rate.   Kristin Balls, PA-C 10/07/24 1219

## 2024-10-07 NOTE — Discharge Instructions (Signed)
 As we discussed, we did recommend that your case be discussed with neurology, however you have opted to leave AGAINST MEDICAL ADVICE prior to this consultation.  I have provided you a referral with neurology with a number to call to schedule an appointment for follow-up.  Please do so at your earliest convenience  Return if development of any new or worsening symptoms

## 2024-10-07 NOTE — ED Triage Notes (Signed)
 Pt was seen by pcp and opthamology for L sided headache that radiates to her L eye, L ear and L jaw.  Ophthalmology stated the exam was clear, but he would wait on labs that were drawn to result.    Pain has persisted and pt still has not heard RE lab results, so she has come here today.

## 2024-10-28 ENCOUNTER — Ambulatory Visit: Admitting: Gastroenterology

## 2024-11-23 ENCOUNTER — Other Ambulatory Visit: Payer: Self-pay | Admitting: Surgery

## 2024-11-23 ENCOUNTER — Ambulatory Visit: Payer: Self-pay | Admitting: Surgery

## 2024-11-23 DIAGNOSIS — R1032 Left lower quadrant pain: Secondary | ICD-10-CM

## 2024-11-23 NOTE — H&P (Signed)
 Kristin Gibson [id number]    Referring Provider:  Self     Subjective    Chief Complaint: Follow-up (HH on CT)       History of Present Illness:   Saw GI in August for ongoing reflux issues, known recurrent HH.  We previously discussed and initiated workup for repair at least of HH. Bypass may not be feasible given complex course from diverticulitis, see below.  GERD increased with any liquid/eating, some regurg, worse with more volume intake. She was started on Voquezna  20 mg for 8 weeks, will send in 10 mg daily and pepcid  at night. Also treated for constipation. 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 She reports no improvement with the voquezna . Also now having some lower abdominal pain.    UGI 01/2024- recurrent small sliding HH, moderate GERD with small epiphrenic pulsion diverticulum, mild dysmotiliy, gastric emptying normal EGD 03/2024 LA grade C esophagitis, 1 to 2 cm sliding-type hiatal hernia, evidence of sleeve gastrectomy healthy appearing mucosa, mild inflammation gastric antrum normal duodenum.   HIDA 05/2024  negative       Dec 2024: This is a very pleasant 68 year old woman known to me following sleeve gastrectomy in February 2023.  She did have a small hiatal hernia repair at the time of her sleeve.  She did fairly well after this however unfortunately developed perforated diverticulitis requiring exploratory laparotomy, Hartman's procedure by Dr. Eletha in September 2023.  She was in the hospital for about a month and had a complicated postoperative course.  Ultimately underwent reversal of her colostomy by Dr. Teresa in September 2024, notably requiring 150 minutes of robotic lysis of adhesions, 3 partial-thickness small bowel repairs and 1 full-thickness small bowel repair.  She recovered well from that however 3 to 4 weeks ago she developed a stomach bug which included about 72 hours of vomiting and diarrhea.  Following this she developed more mid epigastric burning and heartburn  with acid brash.  She is also recently noted intermittent emesis, regardless of what she tries to eat.  She has emesis first thing in the morning before she even eats anything.  She is still taking Protonix  40 mg a day with as needed Mylanta which does help with her reflux symptoms.  The CT scan done last month which does demonstrate recurrence of her hiatal hernia with at least part of her sleeve herniated into the chest and on my review I am concerned there is some torsion of the staple line.     Review of Systems: A complete review of systems was obtained from the patient.  I have reviewed this information and discussed as appropriate with the patient.  See HPI as well for other ROS.     Medical History: Past Medical History      Past Medical History:  Diagnosis Date   Arthritis     GERD (gastroesophageal reflux disease)     Hypertension     Sleep apnea          Problem List     Patient Active Problem List  Diagnosis   Chronic pain of both knees   Colon polyp   Constipation   Diverticulitis of large intestine   Edema leg   Essential hypertension   GERD (gastroesophageal reflux disease)   Hemorrhoids   History of colon polyps   History of hysterectomy for benign disease   History of prediabetes   Hypokalemia   Low back pain   Lower abdominal pain   Major depressive disorder  with single episode, in full remission ()   Morbid obesity (CMS-HCC)   Morbid obesity with BMI of 40.0-44.9, adult (CMS-HCC)   OAB (overactive bladder)   OSA (obstructive sleep apnea)   Primary osteoarthritis of both first carpometacarpal joints   Primary osteoarthritis of left knee   Status post right knee replacement   Trochanteric bursitis of both hips   Vaginal dryness, menopausal   Diverticular disease   S/P partial colectomy        Past Surgical History       Past Surgical History:  Procedure Laterality Date   breast reduction surgery N/A     right knee replacement surgery N/A           Allergies       Allergies  Allergen Reactions   Benazepril Swelling      Angioedema Angioedema     Other Itching and Nausea   Sulfa (Sulfonamide Antibiotics) Itching and Nausea   Sulfur (Not Sulfa) Nausea        Medications Ordered Prior to Encounter        Current Outpatient Medications on File Prior to Visit  Medication Sig Dispense Refill   acetaminophen  (TYLENOL ) 650 MG ER tablet Take by mouth every 8 (eight) hours as needed       amLODIPine  (NORVASC ) 10 MG tablet Take 1 tablet by mouth once daily       FUROsemide (LASIX) 20 MG tablet TAKE 1 TABLET BY MOUTH DAILY AS NEEDED FOR LOWER EXTREMITY SWELLING       methocarbamoL  (ROBAXIN ) 500 MG tablet Take by mouth every 8 (eight) hours as needed       pantoprazole  (PROTONIX ) 40 MG DR tablet Take 1 tablet by mouth once daily       spironolactone  (ALDACTONE ) 50 MG tablet Take 1 tablet by mouth once daily       tiZANidine  (ZANAFLEX ) 2 MG tablet Take by mouth every 8 (eight) hours as needed       vonoprazan 10 mg Tab Take 10 mg by mouth once daily       cloNIDine  HCL (CATAPRES ) 0.1 MG tablet Take 1 tablet by mouth 2 (two) times daily (Patient not taking: Reported on 11/23/2024)        No current facility-administered medications on file prior to visit.        Family History  History reviewed. No pertinent family history.      Tobacco Use History  Social History        Tobacco Use  Smoking Status Former   Types: Cigarettes  Smokeless Tobacco Never        Social History  Social History         Socioeconomic History   Marital status: Divorced  Tobacco Use   Smoking status: Former      Types: Cigarettes   Smokeless tobacco: Never  Vaping Use   Vaping status: Never Used  Substance and Sexual Activity   Alcohol use: Yes   Drug use: Never    Social Drivers of Acupuncturist Strain: Low Risk  (03/10/2024)    Received from [facility]    Overall Financial Resource Strain (CARDIA)      Difficulty of Paying Living Expenses: Not very hard  Food Insecurity: Low Risk  (07/31/2024)    Received from [facility]    Hunger Vital Sign     Within the past 12 months, you worried that your food would run  out before you got money to buy more: Never true     Within the past 12 months, the food you bought just didn't last and you didn't have money to get more. : Never true  Transportation Needs: No Transportation Needs (07/31/2024)    Received from [employer]     In the past 12 months, has lack of reliable transportation kept you from medical appointments, meetings, work or from getting things needed for daily living? : No  Physical Activity: Insufficiently Active (03/10/2024)    Received from [facility]    Exercise Vital Sign     On average, how many days per week do you engage in moderate to strenuous exercise (like a brisk walk)?: 2 days     On average, how many minutes do you engage in exercise at this level?: 20 min  Stress: No Stress Concern Present (03/10/2024)    Received from [facility] - Occupational Stress Questionnaire     Feeling of Stress : Not at all  Social Connections: Socially Integrated (03/10/2024)    Received from [facility]    Social Network     How would you rate your social network (family, work, friends)?: Good participation with social networks  Housing Stability: Unknown (11/23/2024)    Housing Stability Vital Sign     Homeless in the Last Year: No        Objective:         Vitals:    11/23/24 1129  PainSc:   5  PainLoc: Other (Comment)    There is no height or weight on file to calculate BMI.   Gen: A&Ox3, no distress  Respirations Abdomen soft and nontender.  Prior incisions well-healed       Assessment and Plan:  Diagnoses and all orders for this visit:   Left lower quadrant pain -     CT abdomen pelvis with contrast   Hiatal hernia with GERD   S/P laparoscopic  sleeve gastrectomy   S/P colostomy takedown   History of sleeve gastrectomy   Obesity, Class II, BMI 35-39.9   Diverticulitis       We went over the relevant anatomy and discussed options for treatment.  UGI and EGD as above, persistent reflux despite maximal medical therapy. Appreciate thoughtful care from GI.  We discussed that ordinarily in the setting of reflux and hiatal hernia after sleeve gastrectomy the recommended treatment is to convert to a Roux-en-Y gastric bypass however, given her age and prior abdominal surgical history and adhesive disease I would be very hesitant to put her through this, if it is even feasible.  She is also hoping to avoid this. It is reasonable to attempt repairing the recurrent hiatal hernia and I went over the procedure with her including risks of bleeding, infection, pain, scarring, injury to esophagus/stomach/ other structures, dysphagia, failure to resolve symptoms. Will plan laparoscopic assessment of her abdomen at that time to determine feasibility of roux-en-y conversion should that come up in the future.  We discussed that there is a very high rate of hiatal hernia recurrence no matter how this is repaired.  Questions were welcomed and answered to her satisfaction and she wishes to proceed with hiatal hernia repair.   Given recurrent lower abdominal pain will proceed with CT as well to evaluate for recurrent diverticulitis   Teisha Trowbridge ALAN FREUND, MD

## 2024-11-25 ENCOUNTER — Encounter: Payer: Self-pay | Admitting: Surgery

## 2024-11-27 ENCOUNTER — Inpatient Hospital Stay: Admission: RE | Admit: 2024-11-27

## 2024-12-02 ENCOUNTER — Inpatient Hospital Stay: Admission: RE | Admit: 2024-12-02 | Discharge: 2024-12-02 | Attending: Surgery

## 2024-12-02 DIAGNOSIS — R1032 Left lower quadrant pain: Secondary | ICD-10-CM

## 2024-12-02 MED ORDER — IOPAMIDOL (ISOVUE-370) INJECTION 76%
80.0000 mL | Freq: Once | INTRAVENOUS | Status: AC | PRN
  Administered 2024-12-02: 16:00:00 80 mL via INTRAVENOUS

## 2024-12-22 ENCOUNTER — Telehealth: Payer: Self-pay | Admitting: Neurology

## 2024-12-22 NOTE — Progress Notes (Signed)
 COVID Vaccine received:  []  No [x]  Yes Date of any COVID positive Test in last 90 days:  PCP - Marcene Hopper, MD     Izetta Cork, NP ??? Cardiologist - Marcello Lennox, MD at [facility]  Chest x-ray - 09-24-2022  2v  Epic EKG -  11-12-2023  Epic    08-31-2024 CEW request [facility] Dr. Rolla HARPER Stress Test -  ECHO -  Cardiac Cath -  CT Coronary Calcium  score:  [facility] Monitor-09-07-24 at [facility]  Pacemaker / ICD device [x]  No []  Yes   Spinal Cord Stimulator:[x]  No []  Yes       History of Sleep Apnea? []  No [x]  Yes   CPAP used?- []  No [x]  Yes    Medication on DOS: Amlodipine   Gabapentin  ?  Pantoprazole , Famotidine , Tylenol   Hold DOS: Spironolactone   HCTZ,   ?Voquezna - PPI  Patient has: []  NO Hx DM   []  Pre-DM   []  DM1  []   DM2 Does the patient monitor blood sugar?   []  N/A   []  No []  Yes  Last A1c was: 5.9  on 03-10-2024  Blood Thinner / Instructions:  none Aspirin  Instructions:   Activity level: Able to walk up 2 flights of stairs without becoming significantly short of breath or having chest pain?   []    Yes   []  No,  would have:  Patient can perform ADLs without assistance.  []   Yes    []  No   Comments:   Anesthesia review: Bradycardia,  HTN, Pre-DM, GERD, OSA-CPAP, hx provoked DVT post colectomy, PONV,   Patient denies any S&S of respiratory illness or Covid - no shortness of breath, fever, cough or chest pain at PAT appointment.  Patient verbalized understanding and agreement to the Pre-Surgical Instructions that were given to them at this PAT appointment. Patient was also educated of the need to review these PAT instructions again prior to her surgery.I reviewed the appropriate phone numbers to call if they have any and questions or concerns.

## 2024-12-22 NOTE — Patient Instructions (Signed)
 SURGICAL WAITING ROOM VISITATION Patients having surgery or a procedure may have no more than 2 support people in the waiting area - these visitors may rotate in the visitor waiting room.   If the patient needs to stay at the hospital during part of their recovery, the visitor guidelines for inpatient rooms apply.  PRE-OP VISITATION  Pre-op nurse will coordinate an appropriate time for 1 support person to accompany the patient in pre-op.  This support person may not rotate.  This visitor will be contacted when the time is appropriate for the visitor to come back in the pre-op area.  To keep our patients, visitors and teammates safe and prevent the spread of respiratory illnesses over the next few months.  Temporary Visitor Restrictions  Children ages 69 and under will not be able to visit patients in [facility] under most circumstances. Visitation is not restricted outside of hospitals unless noted otherwise in the [facility] and Location Specific Visitation Guidelines at:       [email or web address]  Visitors with respiratory illnesses are discouraged from visiting and should remain at home. You are not required to quarantine at this time prior to your surgery. However, you must do this: Hand Hygiene often Do NOT share personal items Notify your provider if you are in close contact with someone who has COVID or you develop fever 100.4 or greater, new onset of sneezing, cough, sore throat, shortness of breath or body aches.  If you test positive for Covid or have been in contact with anyone that has tested positive in the last 10 days please notify you surgeon.    Your procedure is scheduled on:  FRIDAY  01-08-2025  Report to [facility] Main Entrance: [address] entrance where the [employer] is available.   Report to admitting at: 07:15 AM  Call this number if you have any questions or problems the morning of surgery [phone]  DO NOT EAT OR DRINK ANYTHING AFTER  MIDNIGHT THE NIGHT PRIOR TO YOUR SURGERY / PROCEDURE.   FOLLOW  ANY ADDITIONAL PRE OP INSTRUCTIONS YOU RECEIVED FROM YOUR SURGEON'S OFFICE!!!   Oral Hygiene is also important to reduce your risk of infection.        Remember - BRUSH YOUR TEETH THE MORNING OF SURGERY WITH YOUR REGULAR TOOTHPASTE  Do NOT smoke after Midnight the night before surgery.  STOP TAKING all Vitamins, Herbs and supplements 1 week before your surgery.   Take ONLY these medicines the morning of surgery with A SIP OF WATER : Amlodipine , Pantoprazole , famotidine , Gabapentin   and Tylenol    DO NOT TAKE Spironolactone , HCTZ,   If You have been diagnosed with Sleep Apnea - Bring CPAP mask and tubing day of surgery. We will provide you with a CPAP machine on the day of your surgery.                   You may not have any metal on your body including hair pins, jewelry, and body piercing  Do not wear make-up, lotions, powders, perfumes  or deodorant  Do not wear nail polish including gel and S&S, artificial / acrylic nails, or any other type of covering on natural nails including finger and toenails. If you have artificial nails, gel coating, etc., that needs to be removed by a nail salon, Please have this removed prior to surgery. Not doing so may mean that your surgery could be cancelled or delayed if the Surgeon or anesthesia staff feels like they are unable to  monitor you safely.   Do not shave 48 hours prior to surgery to avoid nicks in your skin which may contribute to postoperative infections.   Contacts, Hearing Aids, dentures or bridgework may not be worn into surgery. DENTURES WILL BE REMOVED PRIOR TO SURGERY PLEASE DO NOT APPLY Poly grip OR ADHESIVES!!!  You may bring a small overnight bag with you on the day of surgery, only pack items that are not valuable. [facility] IS NOT RESPONSIBLE   FOR VALUABLES THAT ARE LOST OR STOLEN.   Do not bring your home medications to the hospital. The Pharmacy will dispense  medications listed on your medication list to you during your admission in the Hospital.  Special Instructions: Bring a copy of your healthcare power of attorney and living will documents the day of surgery, if you wish to have them scanned into your [facility] Records- EPIC  Please read over the following fact sheets you were given: IF YOU HAVE QUESTIONS ABOUT YOUR PRE-OP INSTRUCTIONS, PLEASE CALL [phone].   [facility] - Preparing for Surgery      Before surgery, you can play an important role.  Because skin is not sterile, your skin needs to be as free of germs as possible.  You can reduce the number of germs on your skin by washing with CHG (chlorahexidine gluconate) soap before surgery.  CHG is an antiseptic cleaner which kills germs and bonds with the skin to continue killing germs even after washing. Please DO NOT use if you have an allergy to CHG or antibacterial soaps.  If your skin becomes reddened/irritated stop using the CHG and inform your nurse when you arrive at Short Stay. Do not shave (including legs and underarms) for at least 48 hours prior to the first CHG shower.  You may shave your face/neck.  Please follow these instructions carefully:  1.  Shower with CHG Soap the night before surgery ONLY (DO NOT USE THE CHG SOAP THE MORNING OF SURGERY).  2.  If you choose to wash your hair, wash your hair first as usual with your normal  shampoo.  3.  After you shampoo, rinse your hair and body thoroughly to remove the shampoo.                             4.  Use CHG as you would any other liquid soap.  You can apply chg directly to the skin and wash.  Gently with a scrungie or clean washcloth.  5.  Apply the CHG Soap to your body ONLY FROM THE NECK DOWN.   Do not use on face/ open                           Wound or open sores. Avoid contact with eyes, ears mouth and genitals (private parts).                       Wash face,  Genitals (private parts) with your normal soap.              6.  Wash thoroughly, paying special attention to the area where your  surgery  will be performed.  7.  Thoroughly rinse your body with warm water  from the neck down.  8.  DO NOT shower/wash with your normal soap after using and rinsing off the CHG Soap.  9.  Pat yourself dry with a clean towel.            10.  Wear clean pajamas.            11.  Place clean sheets on your bed the night of your first shower and do not  sleep with pets.  Day of Surgery : Do not apply any CHG, lotions/deodorants the morning of surgery.  Please wear clean clothes to the hospital/surgery center.   FAILURE TO FOLLOW THESE INSTRUCTIONS MAY RESULT IN THE CANCELLATION OF YOUR SURGERY  PATIENT SIGNATURE_________________________________  NURSE SIGNATURE__________________________________  ________________________________________________________________________

## 2024-12-22 NOTE — Telephone Encounter (Signed)
 Pt called to Cancel appt due  conflict    Apppt Canceled

## 2024-12-23 ENCOUNTER — Institutional Professional Consult (permissible substitution): Admitting: Neurology

## 2024-12-23 ENCOUNTER — Encounter (HOSPITAL_COMMUNITY)
Admission: RE | Admit: 2024-12-23 | Discharge: 2024-12-23 | Disposition: A | Discharge status: Home / Self Care | Source: Ambulatory Visit | Attending: Surgery | Admitting: Surgery

## 2024-12-23 ENCOUNTER — Encounter (HOSPITAL_COMMUNITY): Payer: Self-pay

## 2024-12-23 ENCOUNTER — Other Ambulatory Visit: Payer: Self-pay

## 2024-12-23 VITALS — BP 123/82 | HR 62 | Temp 99.0°F | Resp 20 | Ht 69.0 in | Wt 277.0 lb

## 2024-12-23 DIAGNOSIS — G4733 Obstructive sleep apnea (adult) (pediatric): Secondary | ICD-10-CM | POA: Diagnosis not present

## 2024-12-23 DIAGNOSIS — Z87891 Personal history of nicotine dependence: Secondary | ICD-10-CM | POA: Diagnosis not present

## 2024-12-23 DIAGNOSIS — E669 Obesity, unspecified: Secondary | ICD-10-CM | POA: Insufficient documentation

## 2024-12-23 DIAGNOSIS — Z9884 Bariatric surgery status: Secondary | ICD-10-CM | POA: Diagnosis not present

## 2024-12-23 DIAGNOSIS — D638 Anemia in other chronic diseases classified elsewhere: Secondary | ICD-10-CM | POA: Insufficient documentation

## 2024-12-23 DIAGNOSIS — R001 Bradycardia, unspecified: Secondary | ICD-10-CM | POA: Diagnosis not present

## 2024-12-23 DIAGNOSIS — R7303 Prediabetes: Secondary | ICD-10-CM | POA: Insufficient documentation

## 2024-12-23 DIAGNOSIS — Z9889 Other specified postprocedural states: Secondary | ICD-10-CM | POA: Diagnosis not present

## 2024-12-23 DIAGNOSIS — F32A Depression, unspecified: Secondary | ICD-10-CM | POA: Diagnosis not present

## 2024-12-23 DIAGNOSIS — Z01812 Encounter for preprocedural laboratory examination: Secondary | ICD-10-CM | POA: Insufficient documentation

## 2024-12-23 DIAGNOSIS — K219 Gastro-esophageal reflux disease without esophagitis: Secondary | ICD-10-CM | POA: Insufficient documentation

## 2024-12-23 DIAGNOSIS — Z01818 Encounter for other preprocedural examination: Secondary | ICD-10-CM

## 2024-12-23 DIAGNOSIS — R1032 Left lower quadrant pain: Secondary | ICD-10-CM | POA: Diagnosis not present

## 2024-12-23 DIAGNOSIS — K449 Diaphragmatic hernia without obstruction or gangrene: Secondary | ICD-10-CM | POA: Diagnosis not present

## 2024-12-23 DIAGNOSIS — I700 Atherosclerosis of aorta: Secondary | ICD-10-CM | POA: Diagnosis not present

## 2024-12-23 DIAGNOSIS — Z6841 Body Mass Index (BMI) 40.0 and over, adult: Secondary | ICD-10-CM | POA: Insufficient documentation

## 2024-12-23 DIAGNOSIS — Z86718 Personal history of other venous thrombosis and embolism: Secondary | ICD-10-CM | POA: Diagnosis not present

## 2024-12-23 DIAGNOSIS — M1990 Unspecified osteoarthritis, unspecified site: Secondary | ICD-10-CM | POA: Diagnosis not present

## 2024-12-23 DIAGNOSIS — I10 Essential (primary) hypertension: Secondary | ICD-10-CM | POA: Diagnosis not present

## 2024-12-23 HISTORY — DX: Cardiac arrhythmia, unspecified: I49.9

## 2024-12-29 ENCOUNTER — Encounter (HOSPITAL_COMMUNITY): Payer: Self-pay

## 2024-12-29 NOTE — Progress Notes (Signed)
 " Case: [id number] Date/Time: 01/08/25 0923   Procedures:      REPAIR, HERNIA, HIATAL, LAPAROSCOPIC     LAPAROSCOPY, DIAGNOSTIC   Anesthesia type: General   Diagnosis:      Left lower quadrant pain [R10.32]     Hiatal hernia with gastroesophageal reflux [K44.9, K21.9]     Status post laparoscopic sleeve gastrectomy [Z98.84]   Pre-op diagnosis: RECURRENT HIATAL HERNIA WITH GERD   [facility]   Surgeons: Signe Mitzie LABOR, MD       DISCUSSION: Kristin Gibson is a 69 yo female with PMH of former smoking, HTN, aortic atherosclerosis, bradycardia, OSA (CPAP intolerance), hx of DVT, GERD, s/p gastric sleeve with hiatal hernia repair (01/2022), perforated diverticulitis s/p Hartmann's (08/2022) and ostomy reversal (08/2023), arthritis, depression, obesity (BMI 40), PONV  Patient followed by Cardiology at [facility] for sinus bradycardia, palpitations, and HTN. Prior cardiac w/u includes normal stress test in 08/2021 and recent normal echo in 08/2024. Last seen by Dr. Uvaldo on 08/31/2024. Patient noted to be doing well however BP elevated and hydralazine  prescribed. Echo ordered and came back normal as above. Advised f/u in 6 months.  At PAT visit patient reports she can do stairs without CP/SOB. Anticipate she can proceed.   VS: BP 123/82 Comment: right arm sitting  Pulse 62   Temp 37.2 C (Oral)   Resp 20   Ht 5' 9 (1.753 m)   Wt 125.6 kg   SpO2 99%   BMI 40.91 kg/m   PROVIDERS: Corlis Pagan, NP   LABS: Labs reviewed: Acceptable for surgery. Mild AKI compared to prior (all labs ordered are listed, but only abnormal results are displayed)  Labs Reviewed - No data to display  CT Abdomen/Pelvis 12/02/24:   IMPRESSION: 1. Interim development of left abdominal ventral hernia (in the region of previously noted colostomy) containing small amount of mesentery and multiple small bowel loops. No obstruction or incarceration. Interval small umbilical hernia containing a  segment of small bowel but no obstruction or incarceration. 2. Postsurgical changes of the stomach consistent with prior sleeve gastrectomy. Small hiatal hernia 3. Status post partial left colectomy, left abdominal colostomy takedown, and anastomosis of the sigmoid colon at the pelvis. Mild diverticular disease of the descending and proximal sigmoid colon without acute inflammatory process. 4. Hepatic steatosis. 5. Aortic atherosclerosis.   EKG 08/31/24 ([facility] - report only):  Impression Sinus Bradycardia Diffuse low voltage.  Echo 09/07/24 ([facility]):  Impression Left Ventricle: Left ventricle size is normal. There is moderate concentric hypertrophy. No significant LVOT gradient. Systolic function is normal. EF: 60-65%.   Left Atrium: Left atrium is mildly dilated at 4.000 cm.   Right Ventricle: Right ventricle size is normal. Systolic function is normal.   Pericardium: There is a small pericardial effusion noted. There is no echocardiographic evidence of tamponade.   NM stress test 09/04/2021 ([facility]):  CONCLUSIONS: This study is negative for myocardial ischemia with normal LVEF. Mild breast attenuation   Past Medical History:  Diagnosis Date   Arthritis    Back pain    Bradycardia    Diverticulitis    Dysrhythmia    bradycardia   GERD (gastroesophageal reflux disease)    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   History of anemia    History of deep vein thrombosis (DVT) of lower extremity 2023   RLE following her colectomy   History of depression    History of diverticulitis    Hypertension    Knee  pain, chronic    Lower back pain    Multilevel degenerative disc disease    Neuropathy    OSA on CPAP    Osteoarthritis    PONV (postoperative nausea and vomiting)    Pre-diabetes    Sleep apnea    cpap   Vulvovaginitis    Wrist fracture     Past Surgical History:  Procedure Laterality Date   ABDOMINAL HYSTERECTOMY     BREAST REDUCTION SURGERY      COLONOSCOPY     FLEXIBLE SIGMOIDOSCOPY N/A 09/05/2023   Procedure: FLEXIBLE SIGMOIDOSCOPY;  Surgeon: Teresa Lonni HERO, MD;  Location[facility];  Service: General;  Laterality: N/A;   IR CATHETER TUBE CHANGE  09/12/2022   IR SINUS/FIST TUBE CHK-NON GI  09/18/2022   LAPAROSCOPIC GASTRIC SLEEVE RESECTION N/A 02/19/2022   Procedure: LAPAROSCOPIC GASTRIC SLEEVE RESECTION, HIATAL HERNIA REPAIR;  Surgeon: Signe Mitzie LABOR, MD;  [facility];  Service: General;  Laterality: N/A;   PARTIAL COLECTOMY N/A 08/31/2022   Procedure: OPEN PARTIAL COLECTOMY WITH COLOSTOMY;  Surgeon: Eletha Boas, MD;  [facility];  Service: General;  Laterality: N/A;   right knee replacement   2020   TRIGGER FINGER RELEASE Right 04/2023   thumb, [facility] Dr. Zackary   TUBAL LIGATION     UPPER GASTROINTESTINAL ENDOSCOPY     UPPER GI ENDOSCOPY N/A 02/19/2022   Procedure: UPPER GI ENDOSCOPY;  Surgeon: Signe Mitzie LABOR, MD;  [facility];  Service: General;  Laterality: N/A;   XI ROBOTIC ASSISTED COLOSTOMY TAKEDOWN N/A 09/05/2023   Procedure: XI ROBOTIC ASSISTED COLOSTOMY REVERAL; LYSIS OF ADHESIONS, BILATERAL TAP BLOCK, REPAIR OF SMALL BOWEL X 4;  Surgeon: Teresa Lonni HERO, MD;  [facility];  Service: General;  Laterality: N/A;    MEDICATIONS:  acetaminophen  (TYLENOL ) 500 MG tablet   alum & mag hydroxide-simeth (MAALOX MAX) 400-400-40 MG/5ML suspension   AMBULATORY NON FORMULARY MEDICATION   betamethasone valerate ointment (VALISONE) 0.1 %   diclofenac Sodium (VOLTAREN) 1 % GEL   dicyclomine (BENTYL) 20 MG tablet   famotidine  (PEPCID ) 40 MG tablet   gabapentin  (NEURONTIN ) 300 MG capsule   hydrochlorothiazide (HYDRODIURIL) 25 MG tablet   lidocaine  (XYLOCAINE ) 2 % solution   methocarbamol  (ROBAXIN -750) 750 MG tablet   metroNIDAZOLE  (METROGEL ) 0.75 % vaginal gel   Multiple Vitamins-Minerals (BARIATRIC FUSION) CHEW   NON FORMULARY   ondansetron  (ZOFRAN ) 4 MG tablet   Probiotic CAPS    spironolactone  (ALDACTONE ) 50 MG tablet   Vonoprazan Fumarate  (VOQUEZNA ) 10 MG TABS   No current facility-administered medications for this encounter.   Burnard HERO Odis DEVONNA [facility] Short Stay/Anesthesiology [facility] Phone [phone] 12/29/2024 11:02 AM        "

## 2024-12-29 NOTE — Anesthesia Preprocedure Evaluation (Addendum)
"                                    Anesthesia Evaluation  Patient identified by MRN, date of birth, ID band Patient awake    Reviewed: Allergy & Precautions, NPO status , Patient's Chart, lab work & pertinent test results  History of Anesthesia Complications (+) PONV and history of anesthetic complications  Airway Mallampati: II  TM Distance: >3 FB Neck ROM: Full    Dental  (+) Dental Advisory Given   Pulmonary sleep apnea and Continuous Positive Airway Pressure Ventilation , former smoker   breath sounds clear to auscultation       Cardiovascular hypertension, Pt. on medications + DVT  + dysrhythmias (bradycardia)  Rhythm:Regular Rate:Normal  Echo 09/07/24 ([facility]):   Impression Left Ventricle: Left ventricle size is normal. There is moderate concentric hypertrophy. No significant LVOT gradient. Systolic function is normal. EF: 60-65%.   Left Atrium: Left atrium is mildly dilated at 4.000 cm.   Right Ventricle: Right ventricle size is normal. Systolic function is normal.   Pericardium: There is a small pericardial effusion noted. There is no echocardiographic evidence of tamponade.     NM stress test 09/04/2021 ([facility]):   CONCLUSIONS: This study is negative for myocardial ischemia with normal LVEF. Mild breast attenuation    Neuro/Psych  Neuromuscular disease    GI/Hepatic Neg liver ROS,GERD  ,,  Endo/Other    Class 3 obesity  Renal/GU negative Renal ROS     Musculoskeletal  (+) Arthritis ,    Abdominal   Peds  Hematology  (+) Blood dyscrasia, anemia   Anesthesia Other Findings   Reproductive/Obstetrics                              Anesthesia Physical Anesthesia Plan  ASA: 3  Anesthesia Plan: General   Post-op Pain Management: Tylenol  PO (pre-op)*, Ketamine  IV* and Lidocaine  infusion*   Induction: Intravenous  PONV Risk Score and Plan: 4 or greater and Dexamethasone , Ondansetron , Propofol  infusion  and Scopolamine  patch - Pre-op  Airway Management Planned: Oral ETT  Additional Equipment:   Intra-op Plan:   Post-operative Plan: Extubation in OR  Informed Consent: I have reviewed the patients History and Physical, chart, labs and discussed the procedure including the risks, benefits and alternatives for the proposed anesthesia with the patient or authorized representative who has indicated his/her understanding and acceptance.     Dental advisory given  Plan Discussed with: CRNA  Anesthesia Plan Comments:          Anesthesia Quick Evaluation  "

## 2025-01-08 ENCOUNTER — Ambulatory Visit (HOSPITAL_COMMUNITY): Payer: Self-pay | Admitting: Medical

## 2025-01-08 ENCOUNTER — Encounter (HOSPITAL_COMMUNITY): Payer: Self-pay | Admitting: Surgery

## 2025-01-08 ENCOUNTER — Ambulatory Visit (HOSPITAL_COMMUNITY)
Admission: RE | Admit: 2025-01-08 | Discharge: 2025-01-09 | Disposition: A | Discharge status: Home / Self Care | Source: Ambulatory Visit | Attending: Surgery | Admitting: Surgery

## 2025-01-08 ENCOUNTER — Other Ambulatory Visit: Payer: Self-pay

## 2025-01-08 ENCOUNTER — Encounter (HOSPITAL_COMMUNITY)
Admission: RE | Disposition: A | Discharge status: Home / Self Care | Payer: Self-pay | Source: Ambulatory Visit | Attending: Surgery

## 2025-01-08 ENCOUNTER — Ambulatory Visit (HOSPITAL_COMMUNITY)

## 2025-01-08 DIAGNOSIS — Z6841 Body Mass Index (BMI) 40.0 and over, adult: Secondary | ICD-10-CM | POA: Diagnosis not present

## 2025-01-08 DIAGNOSIS — I10 Essential (primary) hypertension: Secondary | ICD-10-CM | POA: Diagnosis not present

## 2025-01-08 DIAGNOSIS — K660 Peritoneal adhesions (postprocedural) (postinfection): Secondary | ICD-10-CM | POA: Diagnosis not present

## 2025-01-08 DIAGNOSIS — Z86718 Personal history of other venous thrombosis and embolism: Secondary | ICD-10-CM | POA: Insufficient documentation

## 2025-01-08 DIAGNOSIS — Z87891 Personal history of nicotine dependence: Secondary | ICD-10-CM | POA: Insufficient documentation

## 2025-01-08 DIAGNOSIS — G4733 Obstructive sleep apnea (adult) (pediatric): Secondary | ICD-10-CM | POA: Insufficient documentation

## 2025-01-08 DIAGNOSIS — K219 Gastro-esophageal reflux disease without esophagitis: Secondary | ICD-10-CM | POA: Insufficient documentation

## 2025-01-08 DIAGNOSIS — M1990 Unspecified osteoarthritis, unspecified site: Secondary | ICD-10-CM | POA: Insufficient documentation

## 2025-01-08 DIAGNOSIS — Z79899 Other long term (current) drug therapy: Secondary | ICD-10-CM | POA: Insufficient documentation

## 2025-01-08 DIAGNOSIS — K449 Diaphragmatic hernia without obstruction or gangrene: Secondary | ICD-10-CM | POA: Diagnosis present

## 2025-01-08 DIAGNOSIS — G4730 Sleep apnea, unspecified: Secondary | ICD-10-CM | POA: Diagnosis not present

## 2025-01-08 DIAGNOSIS — E66813 Obesity, class 3: Secondary | ICD-10-CM | POA: Insufficient documentation

## 2025-01-08 DIAGNOSIS — Z9884 Bariatric surgery status: Secondary | ICD-10-CM | POA: Diagnosis not present

## 2025-01-08 HISTORY — PX: LAPAROSCOPY: SHX197

## 2025-01-08 HISTORY — PX: HIATAL HERNIA REPAIR: SHX195

## 2025-01-08 MED ORDER — SCOPOLAMINE 1 MG/3DAYS TD PT72
1.0000 | MEDICATED_PATCH | TRANSDERMAL | Status: DC
  Administered 2025-01-08: 1 mg via TRANSDERMAL
  Filled 2025-01-08: qty 1

## 2025-01-08 MED ORDER — DOCUSATE SODIUM 100 MG PO CAPS
100.0000 mg | ORAL_CAPSULE | Freq: Two times a day (BID) | ORAL | Status: DC
  Administered 2025-01-08 – 2025-01-09 (×2): 100 mg via ORAL
  Filled 2025-01-08 (×2): qty 1

## 2025-01-08 MED ORDER — MIDAZOLAM HCL 5 MG/5ML IJ SOLN
INTRAMUSCULAR | Status: DC | PRN
  Administered 2025-01-08: 2 mg via INTRAVENOUS

## 2025-01-08 MED ORDER — BUPIVACAINE LIPOSOME 1.3 % IJ SUSP: 20.0000 mL | Freq: Once | INTRAMUSCULAR | Status: DC

## 2025-01-08 MED ORDER — HEPARIN SODIUM (PORCINE) 5000 UNIT/ML IJ SOLN
5000.0000 [IU] | Freq: Three times a day (TID) | INTRAMUSCULAR | Status: DC
  Administered 2025-01-08 – 2025-01-09 (×2): 5000 [IU] via SUBCUTANEOUS
  Filled 2025-01-08 (×2): qty 1

## 2025-01-08 MED ORDER — ACETAMINOPHEN 500 MG PO TABS
1000.0000 mg | ORAL_TABLET | ORAL | Status: AC
  Administered 2025-01-08: 1000 mg via ORAL
  Filled 2025-01-08: qty 2

## 2025-01-08 MED ORDER — EPHEDRINE 5 MG/ML INJ
INTRAVENOUS | Status: AC
  Filled 2025-01-08: qty 5

## 2025-01-08 MED ORDER — PROPOFOL 500 MG/50ML IV EMUL
INTRAVENOUS | Status: DC | PRN
  Administered 2025-01-08: 25 ug/kg/min via INTRAVENOUS

## 2025-01-08 MED ORDER — ALUM & MAG HYDROXIDE-SIMETH 200-200-20 MG/5ML PO SUSP: 5.0000 mL | Freq: Four times a day (QID) | ORAL | Status: DC | PRN

## 2025-01-08 MED ORDER — PROPOFOL 500 MG/50ML IV EMUL
INTRAVENOUS | Status: AC
  Filled 2025-01-08: qty 50

## 2025-01-08 MED ORDER — CHLORHEXIDINE GLUCONATE 4 % EX SOLN: 60.0000 mL | Freq: Once | CUTANEOUS | Status: DC

## 2025-01-08 MED ORDER — HYDROMORPHONE HCL 1 MG/ML IJ SOLN
0.5000 mg | INTRAMUSCULAR | Status: DC | PRN
  Administered 2025-01-08: 0.5 mg via INTRAVENOUS
  Filled 2025-01-08: qty 0.5

## 2025-01-08 MED ORDER — OXYCODONE HCL 5 MG PO TABS: 5.0000 mg | ORAL_TABLET | Freq: Once | ORAL | Status: DC | PRN

## 2025-01-08 MED ORDER — METOPROLOL TARTRATE 5 MG/5ML IV SOLN: 5.0000 mg | Freq: Four times a day (QID) | INTRAVENOUS | Status: DC | PRN

## 2025-01-08 MED ORDER — ORAL CARE MOUTH RINSE: 15.0000 mL | Freq: Once | OROMUCOSAL | Status: AC

## 2025-01-08 MED ORDER — TRAMADOL HCL 50 MG PO TABS: 50.0000 mg | ORAL_TABLET | Freq: Four times a day (QID) | ORAL | 0 refills | Status: AC | PRN

## 2025-01-08 MED ORDER — ROCURONIUM BROMIDE 10 MG/ML (PF) SYRINGE
PREFILLED_SYRINGE | INTRAVENOUS | Status: AC
  Filled 2025-01-08: qty 10

## 2025-01-08 MED ORDER — PROPOFOL 10 MG/ML IV BOLUS
INTRAVENOUS | Status: DC | PRN
  Administered 2025-01-08: 200 mg via INTRAVENOUS

## 2025-01-08 MED ORDER — GABAPENTIN 100 MG PO CAPS
300.0000 mg | ORAL_CAPSULE | Freq: Two times a day (BID) | ORAL | Status: DC
  Administered 2025-01-08 – 2025-01-09 (×2): 300 mg via ORAL
  Filled 2025-01-08 (×2): qty 3

## 2025-01-08 MED ORDER — CEFAZOLIN SODIUM-DEXTROSE 2-4 GM/100ML-% IV SOLN
2.0000 g | INTRAVENOUS | Status: AC
  Administered 2025-01-08: 2 g via INTRAVENOUS
  Filled 2025-01-08: qty 100

## 2025-01-08 MED ORDER — ONDANSETRON 4 MG PO TBDP
4.0000 mg | ORAL_TABLET | Freq: Four times a day (QID) | ORAL | Status: DC | PRN
  Administered 2025-01-08: 4 mg via ORAL
  Filled 2025-01-08: qty 1

## 2025-01-08 MED ORDER — GABAPENTIN 300 MG PO CAPS
300.0000 mg | ORAL_CAPSULE | ORAL | Status: AC
  Administered 2025-01-08: 300 mg via ORAL
  Filled 2025-01-08: qty 1

## 2025-01-08 MED ORDER — METHOCARBAMOL 500 MG PO TABS
500.0000 mg | ORAL_TABLET | Freq: Four times a day (QID) | ORAL | Status: DC | PRN
  Administered 2025-01-08 – 2025-01-09 (×2): 500 mg via ORAL
  Filled 2025-01-08 (×3): qty 1

## 2025-01-08 MED ORDER — SPIRONOLACTONE 25 MG PO TABS
50.0000 mg | ORAL_TABLET | Freq: Every day | ORAL | Status: DC
  Administered 2025-01-09: 50 mg via ORAL
  Filled 2025-01-08: qty 2

## 2025-01-08 MED ORDER — SUGAMMADEX SODIUM 200 MG/2ML IV SOLN
INTRAVENOUS | Status: AC
  Filled 2025-01-08: qty 2

## 2025-01-08 MED ORDER — FENTANYL CITRATE (PF) 50 MCG/ML IJ SOSY
PREFILLED_SYRINGE | INTRAMUSCULAR | Status: AC
  Filled 2025-01-08: qty 2

## 2025-01-08 MED ORDER — OXYCODONE HCL 5 MG/5ML PO SOLN: 5.0000 mg | Freq: Once | ORAL | Status: DC | PRN

## 2025-01-08 MED ORDER — ONDANSETRON HCL 4 MG/2ML IJ SOLN
INTRAMUSCULAR | Status: AC
  Filled 2025-01-08: qty 2

## 2025-01-08 MED ORDER — METOCLOPRAMIDE HCL 5 MG/ML IJ SOLN
10.0000 mg | Freq: Four times a day (QID) | INTRAMUSCULAR | Status: DC
  Administered 2025-01-08 – 2025-01-09 (×3): 10 mg via INTRAVENOUS
  Filled 2025-01-08 (×3): qty 2

## 2025-01-08 MED ORDER — ONDANSETRON HCL 4 MG/2ML IJ SOLN: 4.0000 mg | Freq: Four times a day (QID) | INTRAMUSCULAR | Status: DC | PRN

## 2025-01-08 MED ORDER — PANTOPRAZOLE SODIUM 40 MG IV SOLR
40.0000 mg | Freq: Every day | INTRAVENOUS | Status: DC
  Administered 2025-01-08: 40 mg via INTRAVENOUS
  Filled 2025-01-08: qty 10

## 2025-01-08 MED ORDER — ONDANSETRON HCL 4 MG/2ML IJ SOLN
INTRAMUSCULAR | Status: DC | PRN
  Administered 2025-01-08: 4 mg via INTRAVENOUS

## 2025-01-08 MED ORDER — MENTHOL 3 MG MT LOZG
1.0000 | LOZENGE | OROMUCOSAL | Status: DC | PRN
  Filled 2025-01-08: qty 9

## 2025-01-08 MED ORDER — SIMETHICONE 80 MG PO CHEW
40.0000 mg | CHEWABLE_TABLET | Freq: Four times a day (QID) | ORAL | Status: DC | PRN
  Administered 2025-01-08 (×2): 40 mg via ORAL
  Filled 2025-01-08 (×2): qty 1

## 2025-01-08 MED ORDER — GLYCOPYRROLATE 0.2 MG/ML IJ SOLN
INTRAMUSCULAR | Status: DC | PRN
  Administered 2025-01-08: .2 mg via INTRAVENOUS

## 2025-01-08 MED ORDER — DOCUSATE SODIUM 100 MG PO CAPS: 100.0000 mg | ORAL_CAPSULE | Freq: Two times a day (BID) | ORAL | 0 refills | Status: AC

## 2025-01-08 MED ORDER — SUGAMMADEX SODIUM 200 MG/2ML IV SOLN
INTRAVENOUS | Status: DC | PRN
  Administered 2025-01-08: 200 mg via INTRAVENOUS

## 2025-01-08 MED ORDER — TRAMADOL HCL 50 MG PO TABS: 50.0000 mg | ORAL_TABLET | Freq: Four times a day (QID) | ORAL | Status: DC | PRN

## 2025-01-08 MED ORDER — CHLORHEXIDINE GLUCONATE 0.12 % MT SOLN
15.0000 mL | Freq: Once | OROMUCOSAL | Status: AC
  Administered 2025-01-08: 15 mL via OROMUCOSAL

## 2025-01-08 MED ORDER — VONOPRAZAN FUMARATE 10 MG PO TABS: 10.0000 mg | ORAL_TABLET | Freq: Every day | ORAL | Status: DC

## 2025-01-08 MED ORDER — ACETAMINOPHEN 500 MG PO TABS
1000.0000 mg | ORAL_TABLET | Freq: Four times a day (QID) | ORAL | Status: DC
  Administered 2025-01-08 – 2025-01-09 (×2): 1000 mg via ORAL
  Filled 2025-01-08 (×3): qty 2

## 2025-01-08 MED ORDER — SODIUM CHLORIDE 0.9 % IV SOLN: INTRAVENOUS | Status: DC

## 2025-01-08 MED ORDER — PROPOFOL 1000 MG/100ML IV EMUL
INTRAVENOUS | Status: AC
  Filled 2025-01-08: qty 100

## 2025-01-08 MED ORDER — LACTATED RINGERS IV SOLN: INTRAVENOUS | Status: DC

## 2025-01-08 MED ORDER — MIDAZOLAM HCL 2 MG/2ML IJ SOLN
INTRAMUSCULAR | Status: AC
  Filled 2025-01-08: qty 2

## 2025-01-08 MED ORDER — OXYCODONE HCL 5 MG PO TABS
5.0000 mg | ORAL_TABLET | Freq: Four times a day (QID) | ORAL | Status: DC | PRN
  Administered 2025-01-08 – 2025-01-09 (×3): 5 mg via ORAL
  Filled 2025-01-08 (×3): qty 1

## 2025-01-08 MED ORDER — 0.9 % SODIUM CHLORIDE (POUR BTL) OPTIME
TOPICAL | Status: DC | PRN
  Administered 2025-01-08: 1000 mL

## 2025-01-08 MED ORDER — FENTANYL CITRATE (PF) 50 MCG/ML IJ SOSY
25.0000 ug | PREFILLED_SYRINGE | INTRAMUSCULAR | Status: DC | PRN
  Administered 2025-01-08: 25 ug via INTRAVENOUS
  Administered 2025-01-08: 50 ug via INTRAVENOUS
  Administered 2025-01-08: 25 ug via INTRAVENOUS

## 2025-01-08 MED ORDER — DIPHENHYDRAMINE HCL 50 MG/ML IJ SOLN: 12.5000 mg | Freq: Four times a day (QID) | INTRAMUSCULAR | Status: DC | PRN

## 2025-01-08 MED ORDER — DIPHENHYDRAMINE HCL 12.5 MG/5ML PO ELIX: 12.5000 mg | ORAL_SOLUTION | Freq: Four times a day (QID) | ORAL | Status: DC | PRN

## 2025-01-08 MED ORDER — EPHEDRINE SULFATE (PRESSORS) 25 MG/5ML IV SOSY
PREFILLED_SYRINGE | INTRAVENOUS | Status: DC | PRN
  Administered 2025-01-08 (×2): 10 mg via INTRAVENOUS

## 2025-01-08 MED ORDER — GLYCOPYRROLATE 0.2 MG/ML IJ SOLN
INTRAMUSCULAR | Status: AC
  Filled 2025-01-08: qty 1

## 2025-01-08 MED ORDER — PHENYLEPHRINE HCL (PRESSORS) 10 MG/ML IV SOLN
INTRAVENOUS | Status: DC | PRN
  Administered 2025-01-08 (×2): 80 ug via INTRAVENOUS

## 2025-01-08 MED ORDER — DEXAMETHASONE SOD PHOSPHATE PF 10 MG/ML IJ SOLN
INTRAMUSCULAR | Status: DC | PRN
  Administered 2025-01-08: 10 mg via INTRAVENOUS

## 2025-01-08 MED ORDER — HYDRALAZINE HCL 20 MG/ML IJ SOLN: 10.0000 mg | INTRAMUSCULAR | Status: DC | PRN

## 2025-01-08 MED ORDER — FENTANYL CITRATE (PF) 100 MCG/2ML IJ SOLN
INTRAMUSCULAR | Status: AC
  Filled 2025-01-08: qty 2

## 2025-01-08 MED ORDER — ROCURONIUM BROMIDE 100 MG/10ML IV SOLN
INTRAVENOUS | Status: DC | PRN
  Administered 2025-01-08: 20 mg via INTRAVENOUS
  Administered 2025-01-08: 60 mg via INTRAVENOUS

## 2025-01-08 MED ORDER — ONDANSETRON 4 MG PO TBDP: 4.0000 mg | ORAL_TABLET | Freq: Three times a day (TID) | ORAL | 0 refills | Status: AC | PRN

## 2025-01-08 MED ORDER — LIDOCAINE HCL (CARDIAC) PF 100 MG/5ML IV SOSY
PREFILLED_SYRINGE | INTRAVENOUS | Status: DC | PRN
  Administered 2025-01-08: 60 mg via INTRAVENOUS

## 2025-01-08 MED ORDER — KETAMINE HCL 50 MG/5ML IJ SOSY
PREFILLED_SYRINGE | INTRAMUSCULAR | Status: AC
  Filled 2025-01-08: qty 5

## 2025-01-08 MED ORDER — FENTANYL CITRATE (PF) 100 MCG/2ML IJ SOLN
INTRAMUSCULAR | Status: DC | PRN
  Administered 2025-01-08: 100 ug via INTRAVENOUS

## 2025-01-08 MED ORDER — DROPERIDOL 2.5 MG/ML IJ SOLN: 0.6250 mg | Freq: Once | INTRAMUSCULAR | Status: DC | PRN

## 2025-01-08 MED ORDER — KETAMINE HCL 10 MG/ML IJ SOLN
INTRAMUSCULAR | Status: DC | PRN
  Administered 2025-01-08 (×3): 10 mg via INTRAVENOUS

## 2025-01-08 MED ORDER — HYDRALAZINE HCL 25 MG PO TABS
25.0000 mg | ORAL_TABLET | Freq: Four times a day (QID) | ORAL | Status: DC
  Administered 2025-01-08 – 2025-01-09 (×3): 25 mg via ORAL
  Filled 2025-01-08 (×3): qty 1

## 2025-01-08 MED ORDER — BUPIVACAINE-EPINEPHRINE (PF) 0.25% -1:200000 IJ SOLN
INTRAMUSCULAR | Status: DC | PRN
  Administered 2025-01-08: 30 mL

## 2025-01-08 MED ORDER — BISACODYL 10 MG RE SUPP: 10.0000 mg | Freq: Every day | RECTAL | Status: DC | PRN

## 2025-01-08 NOTE — Anesthesia Postprocedure Evaluation (Signed)
"   Anesthesia Post Note  Patient: Kristin Gibson  Procedure(s) Performed: REPAIR, HERNIA, HIATAL, LAPAROSCOPIC LAPAROSCOPY, DIAGNOSTIC     Patient location during evaluation: PACU Anesthesia Type: General Level of consciousness: awake and alert Pain management: pain level controlled Vital Signs Assessment: post-procedure vital signs reviewed and stable Respiratory status: spontaneous breathing, nonlabored ventilation, respiratory function stable and patient connected to nasal cannula oxygen Cardiovascular status: blood pressure returned to baseline and stable Postop Assessment: no apparent nausea or vomiting Anesthetic complications: no   No notable events documented.  Last Vitals:  Vitals:   01/08/25 1230 01/08/25 1300  BP: (!) 147/88 (!) 146/95  Pulse: 83 (!) 47  Resp: 15 15  Temp:  36.4 C  SpO2: 93% 95%    Last Pain:  Vitals:   01/08/25 1344  TempSrc:   PainSc: 6                  Epifanio Charleston E      "

## 2025-01-08 NOTE — Plan of Care (Signed)
" °  Problem: Education: Goal: Knowledge of the prescribed therapeutic regimen will improve Outcome: Progressing   Problem: Bowel/Gastric: Goal: Gastrointestinal status for postoperative course will improve Outcome: Progressing   Problem: Cardiac: Goal: Ability to maintain an adequate cardiac output Outcome: Progressing   "

## 2025-01-08 NOTE — Discharge Instructions (Signed)
 POST OP INSTRUCTIONS   EAT **See Diet below**  WALK Walk an hour a day (cumulative- not all at once).  Control your pain to do that.    CONTROL PAIN Control pain so that you can walk, sleep, tolerate sneezing/coughing, go up/down stairs.  HAVE A BOWEL MOVEMENT DAILY Keep your bowels regular to avoid problems.  OK to try a laxative to override constipation.  OK to use an antidiarrheal to slow down diarrhea.  Call if not better after 2 tries  CALL IF YOU HAVE PROBLEMS/CONCERNS Call if you are still struggling despite following these instructions. Call if you have concerns not answered by these instructions  Take your usually prescribed home medications unless otherwise directed.  PAIN CONTROL: Pain is best controlled by a usual combination of three different methods TOGETHER: Ice/Heat Over the counter pain medication Prescription pain medication Most patients will experience some swelling and bruising around the incisions.  Ice packs or heating pads (30-60 minutes up to 6 times a day) will help. Use ice for the first few days to help decrease swelling and bruising, then switch to heat to help relax tight/sore spots and speed recovery.  Some people prefer to use ice alone, heat alone, alternating between ice & heat.  Experiment to what works for you.  Swelling and bruising can take several weeks to resolve.   It is helpful to take an over-the-counter pain medication regularly for the first few days: Naproxen (Aleve, etc)  Two 220mg  tabs twice a day OR Ibuprofen  (Advil , etc) Three 200mg  tabs four times a day (every meal & bedtime) AND Acetaminophen  (Tylenol , etc) 500-650mg  four times a day (every meal & bedtime) A  prescription for pain medication (such as oxycodone , hydrocodone, tramadol , gabapentin , methocarbamol , etc) should be given to you upon discharge.  Take your pain medication as prescribed, IF NEEDED.  If you are having problems/concerns with the prescription medicine (does not  control pain, nausea, vomiting, rash, itching, etc), please call us  [phone] to see if we need to switch you to a different pain medicine that will work better for you and/or control your side effect better. If you need a refill on your pain medication, please give us  48 hour notice.  contact your pharmacy.  They will contact our office to request authorization. Prescriptions will not be filled after 5 pm or on week-ends  Avoid getting constipated.   Between the surgery and the pain medications, it is common to experience some constipation.   Increasing fluid intake and taking a fiber supplement (such as Metamucil, Citrucel, FiberCon, MiraLax , etc) 1-2 times a day regularly will usually help prevent this problem from occurring.   A mild laxative (prune juice, Milk of Magnesia, MiraLax , etc) should be taken according to package directions if there are no bowel movements after 48 hours.   Watch out for diarrhea.   If you have many loose bowel movements, simplify your diet to bland foods & liquids for a few days.   Stop any stool softeners and decrease your fiber supplement.   Switching to mild anti-diarrheal medications (Kayopectate, Pepto Bismol) can help.   If this worsens or does not improve, please call us .  Wash / shower every day.  You may shower over the skin glue which is waterproof.  Do not soak or submerge incisions.  No rubbing, scrubbing, lotions or ointments to incisions.  Glue will flake off after about 2 weeks.  You may leave the incision open to air.  You may replace  a dressing/Band-Aid to cover the incision for comfort if you wish.   ACTIVITIES as tolerated:   You may resume regular (light) daily activities beginning the next day--such as daily self-care, walking, climbing stairs--gradually increasing activities as tolerated.  If you can walk 30 minutes without difficulty, it is safe to try more intense activity such as jogging, treadmill, bicycling, low-impact aerobics,  swimming, etc. Refrain from the most intensive and strenuous activity such as sit-ups, heavy lifting (>20lb), contact sports, etc. Avoid pushing, straining bearing down, retching/gagging, and forceful coughing as much as possible.   Refrain from any heavy lifting or straining until at least 6 weeks post-op.   DO NOT PUSH THROUGH PAIN.  Let pain be your guide: If it hurts to do something, don't do it.  Pain is your body warning you to avoid that activity for another week until the pain goes down. You may drive when you are no longer taking prescription pain medication, you can comfortably wear a seatbelt, and you can safely maneuver your car and apply brakes. You may have sexual intercourse when it is comfortable.  FOLLOW UP in our office Please call [facility] at [phone] to set up an appointment to see your surgeon in the office for a follow-up appointment approximately 2-3 weeks after your surgery. Make sure that you call for this appointment the day you arrive home to insure a convenient appointment time.  10. IF YOU HAVE DISABILITY OR FAMILY LEAVE FORMS, BRING THEM TO THE OFFICE FOR PROCESSING.  DO NOT GIVE THEM TO YOUR DOCTOR.   WHEN TO CALL US  ([phone]) [phone]: Poor pain control Reactions / problems with new medications (rash/itching, nausea, etc)  Fever over 101.5 F (38.5 C) Inability to urinate Nausea and/or vomiting Worsening swelling or bruising Continued bleeding from incision. Increased pain, redness, or drainage from the incision   The clinic staff is available to answer your questions during regular business hours (8:30am-5pm).  Please dont hesitate to call and ask to speak to one of our nurses for clinical concerns.   If you have a medical emergency, go to the nearest emergency room or call 911.  A surgeon from [facility] is always on call at the [facility], [place] [address], [address], [place], [place]  [address] ? MAIN: (336)  [phone] ? TOLL FREE: [phone] ?  FAX [phone] [email or web address]   EATING AFTER YOUR PARAESOPHAGEAL HERNIA REPAIR SURGERY  After your esophageal surgery, expect some sticking with swallowing over the next 1-2 months.    If food sticks when you eat, it is called dysphagia.  This is due to swelling around your esophagus at the wrap & hiatal diaphragm repair.  It will gradually ease off over the next few months.  To help you through this temporary phase, we start you out on a pureed (blenderized) diet.  Your first meal in the hospital was thin liquids.  You should have been given a pureed diet by the time you left the hospital.  We ask patients to stay on a pureed diet for the first 2-3 weeks to avoid anything getting stuck near your recent surgery.  Don't be alarmed if your ability to swallow doesn't progress according to this plan.  Everyone is different and some diets can advance more or less quickly.    It is often helpful to crush your medications or split them as they can sometimes stick, especially the first week or so.   Some BASIC  RULES to follow are: Maintain an upright position whenever eating or drinking. Take small bites - just a teaspoon size bite at a time. Eat slowly.  It may also help to eat only one food at a time. Consider nibbling through smaller, more frequent meals & avoid the urge to eat BIG meals Do not push through feelings of fullness, nausea, or bloatedness Do not mix solid foods and liquids in the same mouthful Try not to wash foods down with large gulps of liquids. Avoid carbonated (bubbly/fizzy) drinks.   Avoid foods that make you feel gassy or bloated.  Start with bland foods first.  Wait on trying greasy, fried, or spicy meals until you are tolerating more bland solids well. Understand that it will be hard to burp and belch at first.  This gradually improves with time.  Expect to be more gassy/flatulent/bloated initially.  Walking  will help your body manage it better. Consider using medications for bloating that contain simethicone  such as  Maalox or Gas-X  Consider crushing her medications, especially smaller pills.  The ability to swallow pills should get easier after a few weeks Eat in a relaxed atmosphere & minimize distractions. Avoid talking while eating.   Do not use straws. Following each meal, sit in an upright position (90 degree angle) for 60 to 90 minutes.  Going for a short walk can help as well If food does stick, don't panic.  Try to relax and let the food pass on its own.  Sipping WARM LIQUID such as strong hot black tea can also help slide it down.   Be gradual in changes & use common sense:  -If you easily tolerating a certain level of foods, advance to the next level gradually -If you are having trouble swallowing a particular food, then avoid it.   -If food is sticking when you advance your diet, go back to thinner previous diet (the lower LEVEL) for 1-2 days.  LEVEL 1 = PUREED DIET  Do for the first 1 WEEK AFTER SURGERY  -Foods in this group are pureed or blenderized to a smooth, mashed potato-like consistency.  -If necessary, the pureed foods can keep their shape with the addition of a thickening agent.   -Meat should be pureed to a smooth, pasty consistency.  Hot broth or gravy may be added to the pureed meat, approximately 1 oz. of liquid per 3 oz. serving of meat. -CAUTION:  If any foods do not puree into a smooth consistency, swallowing will be more difficult.  (For example, nuts or seeds sometimes do not blend well.)  Hot Foods Cold Foods  Pureed scrambled eggs and cheese Pureed cottage cheese  Baby cereals Thickened juices and nectars  Thinned cooked cereals (no lumps) Thickened milk or eggnog  Pureed French toast or pancakes Ensure  Mashed potatoes Ice cream  Pureed parsley, au gratin, scalloped potatoes, candied sweet potatoes Fruit or Italian ice, sherbet  Pureed buttered or  alfredo noodles Plain yogurt  Pureed vegetables (no corn or peas) Instant breakfast  Pureed soups and creamed soups Smooth pudding, mousse, custard  Pureed scalloped apples Whipped gelatin  Gravies Sugar, syrup, honey, jelly  Sauces, cheese, tomato, barbecue, white, creamed Cream  Any baby food Creamer  Alcohol in moderation (not beer or champagne) Margarine  Coffee or tea Mayonnaise   Ketchup, mustard   Apple sauce   SAMPLE MENU:  PUREED DIET Breakfast Lunch Dinner  Orange juice, 1/2 cup Cream of wheat, 1/2 cup Pineapple juice, 1/2 cup Pureed  turkey, barley soup, 3/4 cup Pureed Hawaiian chicken, 3 oz  Scrambled eggs, mashed or blended with cheese, 1/2 cup Tea or coffee, 1 cup  Whole milk, 1 cup  Non-dairy creamer, 2 Tbsp. Mashed potatoes, 1/2 cup Pureed cooled broccoli, 1/2 cup Apple sauce, 1/2 cup Coffee or tea Mashed potatoes, 1/2 cup Pureed spinach, 1/2 cup Frozen yogurt, 1/2 cup Tea or coffee      LEVEL 2 = SOFT DIET  After your first week, you can advance to a soft diet.   Keep on this diet until everything goes down easily. For some, that is a week. For others, it can take several weeks.   Hot Foods Cold Foods  White fish Cottage cheese  Stuffed fish Junior baby fruit  Baby food meals Semi thickened juices  Minced soft cooked, scrambled, poached eggs nectars  Souffle & omelets Ripe mashed bananas  Cooked cereals Canned fruit, pineapple sauce, milk  potatoes Milkshake  Buttered or Alfredo noodles Custard  Cooked cooled vegetable Puddings, including tapioca  Sherbet Yogurt  Vegetable soup or alphabet soup Fruit ice, Italian ice  Gravies Whipped gelatin  Sugar, syrup, honey, jelly Junior baby desserts  Sauces:  Cheese, creamed, barbecue, tomato, white Cream  Coffee or tea Margarine   SAMPLE MENU:  LEVEL 2 Breakfast Lunch Dinner  Orange juice, 1/2 cup Oatmeal, 1/2 cup Scrambled eggs with cheese, 1/2 cup Decaffeinated tea, 1 cup Whole milk, 1  cup Non-dairy creamer, 2 Tbsp Pineapple juice, 1/2 cup Minced beef, 3 oz Gravy, 2 Tbsp Mashed potatoes, 1/2 cup Minced fresh broccoli, 1/2 cup Applesauce, 1/2 cup Coffee, 1 cup Turkey, barley soup, 3/4 cup Minced Hawaiian chicken, 3 oz Mashed potatoes, 1/2 cup Cooked spinach, 1/2 cup Frozen yogurt, 1/2 cup Non-dairy creamer, 2 Tbsp      LEVEL 3 = CHOPPED DIET  -After all the foods in level 2 (soft diet) are passing through well you should advance up to more chopped foods.  -It is still important to cut these foods into small pieces and eat slowly.  Hot Foods Cold Foods  Poultry Cottage cheese  Chopped Swedish meatballs Yogurt  Meat salads (ground or flaked meat) Milk  Flaked fish (tuna) Milkshakes  Poached or scrambled eggs Soft, cold, dry cereal  Souffles and omelets Fruit juices or nectars  Cooked cereals Chopped canned fruit  Chopped French toast or pancakes Canned fruit cocktail  Noodles or pasta (no rice) Pudding, mousse, custard  Cooked vegetables (no frozen peas, corn, or mixed vegetables) Green salad  Canned small sweet peas Ice cream  Creamed soup or vegetable soup Fruit ice, Italian ice  Pureed vegetable soup or alphabet soup Non-dairy creamer  Ground scalloped apples Margarine  Gravies Mayonnaise  Sauces:  Cheese, creamed, barbecue, tomato, white Ketchup  Coffee or tea Mustard   SAMPLE MENU:  LEVEL 3 Breakfast Lunch Dinner  Orange juice, 1/2 cup Oatmeal, 1/2 cup Scrambled eggs with cheese, 1/2 cup Decaffeinated tea, 1 cup Whole milk, 1 cup Non-dairy creamer, 2 Tbsp Ketchup, 1 Tbsp Margarine, 1 tsp Salt, 1/4 tsp Sugar, 2 tsp Pineapple juice, 1/2 cup Ground beef, 3 oz Gravy, 2 Tbsp Mashed potatoes, 1/2 cup Cooked spinach, 1/2 cup Applesauce, 1/2 cup Decaffeinated coffee Whole milk Non-dairy creamer, 2 Tbsp Margarine, 1 tsp Salt, 1/4 tsp Pureed turkey, barley soup, 3/4 cup Barbecue chicken, 3 oz Mashed potatoes, 1/2 cup Ground fresh broccoli,  1/2 cup Frozen yogurt, 1/2 cup Decaffeinated tea, 1 cup Non-dairy creamer, 2 Tbsp Margarine, 1 tsp Salt, 1/4 tsp  Sugar, 1 tsp    LEVEL 4:  REGULAR FOODS  -Foods in this group are soft, moist, regularly textured foods.   -This level includes meat and breads, which tend to be the hardest things to swallow.   -Eat very slowly, chew well and continue to avoid carbonated drinks. -most people are at this level in 4-6 weeks  Hot Foods Cold Foods  Baked fish or skinned Soft cheeses - cottage cheese  Souffles and omelets Cream cheese  Eggs Yogurt  Stuffed shells Milk  Spaghetti with meat sauce Milkshakes  Cooked cereal Cold dry cereals (no nuts, dried fruit, coconut)  French toast or pancakes Crackers  Buttered toast Fruit juices or nectars  Noodles or pasta (no rice) Canned fruit  Potatoes (all types) Ripe bananas  Soft, cooked vegetables (no corn, lima, or baked beans) Peeled, ripe, fresh fruit  Creamed soups or vegetable soup Cakes (no nuts, dried fruit, coconut)  Canned chicken noodle soup Plain doughnuts  Gravies Ice cream  Bacon dressing Pudding, mousse, custard  Sauces:  Cheese, creamed, barbecue, tomato, white Fruit ice, Italian ice, sherbet  Decaffeinated tea or coffee Whipped gelatin  Pork chops Regular gelatin   Canned fruited gelatin molds   Sugar, syrup, honey, jam, jelly   Cream   Non-dairy   Margarine   Oil   Mayonnaise   Ketchup   Mustard   TROUBLESHOOTING IRREGULAR BOWELS  1) Avoid extremes of bowel movements (no bad constipation/diarrhea)  2) Miralax  17gm mixed in 8oz. water  or juice-daily. May use BID as needed.  3) Gas-x,Phazyme, etc. as needed for gas & bloating.  4) Soft,bland diet. No spicy,greasy,fried foods.  5) Prilosec over-the-counter as needed  6) May hold gluten/wheat products from diet to see if symptoms improve.  7) May try probiotics (Align, Activa, etc) to help calm the bowels down  7) If symptoms become worse call back  immediately.    If you have any questions please call our office at [facility]: [phone].

## 2025-01-08 NOTE — H&P (Signed)
 Kristin Gibson [id number]    Referring Provider:  Self     Subjective    Chief Complaint: Follow-up (HH on CT)       History of Present Illness:   Saw GI in August for ongoing reflux issues, known recurrent HH.  We previously discussed and initiated workup for repair at least of HH. Bypass may not be feasible given complex course from diverticulitis, see below.  GERD increased with any liquid/eating, some regurg, worse with more volume intake. She was started on Voquezna  20 mg for 8 weeks, will send in 10 mg daily and pepcid  at night. Also treated for constipation. 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 She reports no improvement with the voquezna . Also now having some lower abdominal pain.    UGI 01/2024- recurrent small sliding HH, moderate GERD with small epiphrenic pulsion diverticulum, mild dysmotiliy, gastric emptying normal EGD 03/2024 LA grade C esophagitis, 1 to 2 cm sliding-type hiatal hernia, evidence of sleeve gastrectomy healthy appearing mucosa, mild inflammation gastric antrum normal duodenum.   HIDA 05/2024  negative       Dec 2024: This is a very pleasant 69 year old woman known to me following sleeve gastrectomy in February 2023.  She did have a small hiatal hernia repair at the time of her sleeve.  She did fairly well after this however unfortunately developed perforated diverticulitis requiring exploratory laparotomy, Hartman's procedure by Dr. Eletha in September 2023.  She was in the hospital for about a month and had a complicated postoperative course.  Ultimately underwent reversal of her colostomy by Dr. Teresa in September 2024, notably requiring 150 minutes of robotic lysis of adhesions, 3 partial-thickness small bowel repairs and 1 full-thickness small bowel repair.  She recovered well from that however 3 to 4 weeks ago she developed a stomach bug which included about 72 hours of vomiting and diarrhea.  Following this she developed more mid epigastric burning and heartburn with  acid brash.  She is also recently noted intermittent emesis, regardless of what she tries to eat.  She has emesis first thing in the morning before she even eats anything.  She is still taking Protonix  40 mg a day with as needed Mylanta which does help with her reflux symptoms.  The CT scan done last month which does demonstrate recurrence of her hiatal hernia with at least part of her sleeve herniated into the chest and on my review I am concerned there is some torsion of the staple line.     Review of Systems: A complete review of systems was obtained from the patient.  I have reviewed this information and discussed as appropriate with the patient.  See HPI as well for other ROS.     Medical History: Past Medical History         Past Medical History:  Diagnosis Date   Arthritis     GERD (gastroesophageal reflux disease)     Hypertension     Sleep apnea          Problem List       Patient Active Problem List  Diagnosis   Chronic pain of both knees   Colon polyp   Constipation   Diverticulitis of large intestine   Edema leg   Essential hypertension   GERD (gastroesophageal reflux disease)   Hemorrhoids   History of colon polyps   History of hysterectomy for benign disease   History of prediabetes   Hypokalemia   Low back pain   Lower abdominal pain  Major depressive disorder with single episode, in full remission ()   Morbid obesity (CMS-HCC)   Morbid obesity with BMI of 40.0-44.9, adult (CMS-HCC)   OAB (overactive bladder)   OSA (obstructive sleep apnea)   Primary osteoarthritis of both first carpometacarpal joints   Primary osteoarthritis of left knee   Status post right knee replacement   Trochanteric bursitis of both hips   Vaginal dryness, menopausal   Diverticular disease   S/P partial colectomy        Past Surgical History           Past Surgical History:  Procedure Laterality Date   breast reduction surgery N/A     right knee replacement surgery N/A           Allergies           Allergies  Allergen Reactions   Benazepril Swelling      Angioedema Angioedema     Other Itching and Nausea   Sulfa (Sulfonamide Antibiotics) Itching and Nausea   Sulfur (Not Sulfa) Nausea        Medications Ordered Prior to Encounter             Current Outpatient Medications on File Prior to Visit  Medication Sig Dispense Refill   acetaminophen  (TYLENOL ) 650 MG ER tablet Take by mouth every 8 (eight) hours as needed       amLODIPine  (NORVASC ) 10 MG tablet Take 1 tablet by mouth once daily       FUROsemide (LASIX) 20 MG tablet TAKE 1 TABLET BY MOUTH DAILY AS NEEDED FOR LOWER EXTREMITY SWELLING       methocarbamoL  (ROBAXIN ) 500 MG tablet Take by mouth every 8 (eight) hours as needed       pantoprazole  (PROTONIX ) 40 MG DR tablet Take 1 tablet by mouth once daily       spironolactone  (ALDACTONE ) 50 MG tablet Take 1 tablet by mouth once daily       tiZANidine  (ZANAFLEX ) 2 MG tablet Take by mouth every 8 (eight) hours as needed       vonoprazan 10 mg Tab Take 10 mg by mouth once daily       cloNIDine  HCL (CATAPRES ) 0.1 MG tablet Take 1 tablet by mouth 2 (two) times daily (Patient not taking: Reported on 11/23/2024)        No current facility-administered medications on file prior to visit.        Family History  History reviewed. No pertinent family history.      Tobacco Use History  Social History           Tobacco Use  Smoking Status Former   Types: Cigarettes  Smokeless Tobacco Never        Social History  Social History             Socioeconomic History   Marital status: Divorced  Tobacco Use   Smoking status: Former      Types: Cigarettes   Smokeless tobacco: Never  Vaping Use   Vaping status: Never Used  Substance and Sexual Activity   Alcohol use: Yes   Drug use: Never    Social Drivers of Barista Strain: Low Risk  (03/10/2024)    Received from [facility]    Overall Financial Resource  Strain (CARDIA)     Difficulty of Paying Living Expenses: Not very hard  Food Insecurity: Low Risk  (07/31/2024)  Received from [facility]    Hunger Vital Sign     Within the past 12 months, you worried that your food would run out before you got money to buy more: Never true     Within the past 12 months, the food you bought just didn't last and you didn't have money to get more. : Never true  Transportation Needs: No Transportation Needs (07/31/2024)    Received from [employer]     In the past 12 months, has lack of reliable transportation kept you from medical appointments, meetings, work or from getting things needed for daily living? : No  Physical Activity: Insufficiently Active (03/10/2024)    Received from [facility]    Exercise Vital Sign     On average, how many days per week do you engage in moderate to strenuous exercise (like a brisk walk)?: 2 days     On average, how many minutes do you engage in exercise at this level?: 20 min  Stress: No Stress Concern Present (03/10/2024)    Received from [facility] - Occupational Stress Questionnaire     Feeling of Stress : Not at all  Social Connections: Socially Integrated (03/10/2024)    Received from [facility]    Social Network     How would you rate your social network (family, work, friends)?: Good participation with social networks  Housing Stability: Unknown (11/23/2024)    Housing Stability Vital Sign     Homeless in the Last Year: No        Objective:           Vitals:    11/23/24 1129  PainSc:   5  PainLoc: Other (Comment)    There is no height or weight on file to calculate BMI.   Gen: A&Ox3, no distress  Respirations Abdomen soft and nontender.  Prior incisions well-healed       Assessment and Plan:  Diagnoses and all orders for this visit:   Left lower quadrant pain -     CT abdomen pelvis with contrast   Hiatal hernia with  GERD   S/P laparoscopic sleeve gastrectomy   S/P colostomy takedown   History of sleeve gastrectomy   Obesity, Class II, BMI 35-39.9   Diverticulitis       We went over the relevant anatomy and discussed options for treatment.  UGI and EGD as above, persistent reflux despite maximal medical therapy. Appreciate thoughtful care from GI.  We discussed that ordinarily in the setting of reflux and hiatal hernia after sleeve gastrectomy the recommended treatment is to convert to a Roux-en-Y gastric bypass however, given her age and prior abdominal surgical history and adhesive disease I would be very hesitant to put her through this, if it is even feasible.  She is also hoping to avoid this. It is reasonable to attempt repairing the recurrent hiatal hernia and I went over the procedure with her including risks of bleeding, infection, pain, scarring, injury to esophagus/stomach/ other structures, dysphagia, failure to resolve symptoms. Will plan laparoscopic assessment of her abdomen at that time to determine feasibility of roux-en-y conversion should that come up in the future.  We discussed that there is a very high rate of hiatal hernia recurrence no matter how this is repaired.  Questions were welcomed and answered to her satisfaction and she wishes to proceed with hiatal hernia repair.   Given recurrent lower abdominal pain  will proceed with CT as well to evaluate for recurrent diverticulitis  Addendum- CT notable for umbilical and LLQ ventral hernias. Will proceed with diagnostic laparoscopy, hiatal hernia repair, and will assess the ventral hernias. May need temporizing primary repair of at least the umbilical component and will plan subsequently to have her see one of my hernia specialist partners. We considered coordinating surgery but concluded this would be a very long and complex procedure to put her through and likely safer to defer hernia repair.    Juanluis Guastella ALAN FREUND, MD

## 2025-01-08 NOTE — Progress Notes (Signed)
" °   01/08/25 2330  BiPAP/CPAP/SIPAP  $ Non-Invasive Home Ventilator  Initial  $ Face Mask Medium Yes  BiPAP/CPAP/SIPAP Pt Type Adult  BiPAP/CPAP/SIPAP Resmed  Mask Type Full face mask  Dentures removed? Not applicable  Mask Size Medium  Respiratory Rate 18 breaths/min  FiO2 (%) 21 %  Patient Home Machine No  Patient Home Mask No  Patient Home Tubing No  Auto Titrate Yes  Minimum cmH2O 5 cmH2O  Maximum cmH2O 20 cmH2O  Nasal massage performed Yes  CPAP/SIPAP surface wiped down Yes  Device Plugged into RED Power Outlet Yes  BiPAP/CPAP /SiPAP Vitals  Pulse Rate 61  Resp 18  SpO2 94 %  Bilateral Breath Sounds Clear    "

## 2025-01-08 NOTE — Op Note (Signed)
 Operative Note  Kristin Gibson  [id number]  [id number]  01/08/2025   Surgeon: Mitzie Freund MD FACS   Assistant: Camellia Blush MD FACS   Procedure performed: Diagnostic laparoscopy, laparoscopic repair of recurrent hiatal hernia   Preop diagnosis: Hiatal hernia with GERD in setting of prior sleeve gastrectomy, complex surgical history as described in H&P  Post-op diagnosis/intraop findings: same, extensive small bowel adhesions to the anterior abdominal wall   Specimens: no Retained items: no  EBL: minimal cc Complications: none   Description of procedure: After confirming informed consent the patient was taken to the operating room and placed supine on the operating room table where general endotracheal anesthesia was initiated, preoperative antibiotics were administered, SCDs applied, and a formal timeout was performed.  Foley catheter was placed which is removed in the case.  The abdomen was prepped and draped in usual sterile fashion.  Peritoneal access was gained using a Veress needle in the left subcostal position followed by uneventful insufflation to 15 mmHg.  A 5 mm trocar and camera were then inserted using optical entry in the right upper quadrant.  The abdominal cavity was surveyed and confirmed to be free of injury from our entry.  Extensive small bowel adhesions to the anterior abdominal wall are noted, see photos below.  The adhesions preclude visualization of the periumbilical ventral hernia but we are able to partially visualize the colostomy site hernia and this appears to be easily reducible with a fairly broad aperture.  Under direct visualization, bilateral laparoscopic assisted tap blocks were performed using quarter percent Marcaine  with epinephrine  for postoperative pain control, remaining trocars were inserted.  The port for the camera was placed just superior to the small bowel adhesions carefully under direct visualization.  A liver retractor is inserted in the  subxiphoid location for fixed retraction of the left lobe. The hiatal hernia is immediately visible.  A combination of harmonic scalpel and blunt dissection ensued to carefully circumferentially dissect the crura, beginning on the patient's right side and proceeding anteriorly and posteriorly around to the left side and completely freeing the small hernia sac.  A Penrose was placed around the distal esophagus to aid in retraction and mediastinal attachments of the esophagus were carefully dissected until we had mobilized at least 4 cm of intra-abdominal tension-free esophageal length.  The vagus nerves were visualized and protected.  Hemostasis was ensured and the dissected esophagus appears healthy without any evidence of injury. On completion, the hiatal hernia defect is fairly small.  The original simple interrupted Ethibond/ ty-knot was left in situ (most posterior) and 2 additional posterior crural sutures were placed, one hand tied (more posterior) and 1 secured with a ty- knot (the more anterior suture).  This narrows the hiatus to approximately 2.5 cm.  This appears gently snug on the distal esophagus, allowing for some laxity of the anterior diaphragm.  The abdominal cavity was once again surveyed and confirmed to be free of any injury.  The liver retractor was removed under direct visualization.  The fascia at the right paramedian 11 mm trocar site was closed with a 0 Vicryl using laparoscopic suture passer under direct visualization.  The abdomen was then desufflated and the trocars were removed.  The skin incisions were closed with subcuticular 4-0 Monocryl and Dermabond. The patient was then awakened, extubated and taken to PACU in stable condition.    All counts were correct at the completion of the case.    Top right: upper border of stoma site hernia  and adhesions, from right side Top left: midline abdominal wall adhesions, note novafil from prior laparotomy/ hartmans. Falciform and edge of  stoma site hernia visible in the left upper half of the picture Bottom right& bottom left: looking from right upper quadran down to lower midline and pelvis      Photos below taken from patient left side; stoma site hernia notable in right photo

## 2025-01-08 NOTE — Transfer of Care (Signed)
 Immediate Anesthesia Transfer of Care Note  Patient: Kristin Gibson  Procedure(s) Performed: REPAIR, HERNIA, HIATAL, LAPAROSCOPIC LAPAROSCOPY, DIAGNOSTIC  Patient Location: PACU  Anesthesia Type:General  Level of Consciousness: awake  Airway & Oxygen Therapy: Patient Spontanous Breathing and Patient connected to face mask oxygen  Post-op Assessment: Report given to RN and Post -op Vital signs reviewed and stable  Post vital signs: Reviewed and stable  Last Vitals:  Vitals Value Taken Time  BP 153/96 01/08/25 11:15  Temp    Pulse 83 01/08/25 11:19  Resp 21 01/08/25 11:19  SpO2 92 % 01/08/25 11:19  Vitals shown include unfiled device data.  Last Pain:  Vitals:   01/08/25 0735  TempSrc: Oral  PainSc: 0-No pain         Complications: No notable events documented.

## 2025-01-08 NOTE — Anesthesia Procedure Notes (Signed)
 Procedure Name: Intubation Date/Time: 01/08/2025 9:04 AM  Performed by: Pam Macario BROCKS, CRNAPre-anesthesia Checklist: Patient identified, Emergency Drugs available, Suction available, Patient being monitored and Timeout performed Patient Re-evaluated:Patient Re-evaluated prior to induction Oxygen Delivery Method: Circle system utilized Preoxygenation: Pre-oxygenation with 100% oxygen Induction Type: IV induction Ventilation: Mask ventilation without difficulty Laryngoscope Size: Mac and 4 Grade View: Grade II Tube type: Oral Tube size: 7.0 mm Number of attempts: 1 Airway Equipment and Method: Stylet and Oral airway Placement Confirmation: ETT inserted through vocal cords under direct vision, positive ETCO2, breath sounds checked- equal and bilateral and CO2 detector Secured at: 22 cm Tube secured with: Tape Dental Injury: Teeth and Oropharynx as per pre-operative assessment

## 2025-01-09 ENCOUNTER — Encounter (HOSPITAL_COMMUNITY): Payer: Self-pay | Admitting: Surgery

## 2025-01-09 DIAGNOSIS — K449 Diaphragmatic hernia without obstruction or gangrene: Secondary | ICD-10-CM | POA: Diagnosis not present

## 2025-01-09 LAB — CBC
HCT: 38.3 % (ref 36.0–46.0)
Hemoglobin: 12.3 g/dL (ref 12.0–15.0)
MCH: 29.5 pg (ref 26.0–34.0)
MCHC: 32.1 g/dL (ref 30.0–36.0)
MCV: 91.8 fL (ref 80.0–100.0)
Platelets: 182 K/uL (ref 150–400)
RBC: 4.17 MIL/uL (ref 3.87–5.11)
RDW: 13.9 % (ref 11.5–15.5)
WBC: 6.8 K/uL (ref 4.0–10.5)
nRBC: 0 % (ref 0.0–0.2)

## 2025-01-09 LAB — BASIC METABOLIC PANEL WITH GFR
Anion gap: 9 (ref 5–15)
BUN: 9 mg/dL (ref 8–23)
CO2: 23 mmol/L (ref 22–32)
Calcium: 9.2 mg/dL (ref 8.9–10.3)
Chloride: 106 mmol/L (ref 98–111)
Creatinine, Ser: 0.8 mg/dL (ref 0.44–1.00)
GFR, Estimated: >60 mL/min
Glucose, Bld: 116 mg/dL — ABNORMAL HIGH (ref 70–99)
Potassium: 4.6 mmol/L (ref 3.5–5.1)
Sodium: 139 mmol/L (ref 135–145)

## 2025-01-09 LAB — MAGNESIUM: Magnesium: 2.1 mg/dL (ref 1.7–2.4)

## 2025-01-09 NOTE — Progress Notes (Signed)
 Kristin Gibson to be D/C'd Home per MD order.  Discussed with the patient and all questions fully answered.  IV catheters discontinued intact. Site without signs and symptoms of complications. Dressing and pressure applied.  An After Visit Summary was printed and given to the patient. Patient prescriptions sent to her pharmacy.  D/c education completed with patient/family including follow up instructions, medication list, d/c activities limitations if indicated, with other d/c instructions as indicated by MD - patient able to verbalize understanding, all questions fully answered.   Patient instructed to return to ED, call 911, or call MD for any changes in condition.   Patient awaiting granddaughter to pick up.  Kristin Gibson 01/09/2025 10:57 AM

## 2025-01-09 NOTE — Discharge Summary (Signed)
 Physician Discharge Summary  Patient ID: Kristin Gibson MRN: [id number] DOB/AGE: [birth year] 69 y.o.  Admit date: 01/08/2025 Discharge date: 01/09/2025  Admission Diagnoses:  Discharge Diagnoses:  Principal Problem:   Hiatal hernia   Discharged Condition: good  Hospital Course: Patient was admitted to the med surg floor after surgery.  Diet was advanced as tolerated.  By postop day 1, she was tolerating a liquid diet and pain was controlled with oral medications.  She was urinating without difficulty and ambulating without assistance.  Patient was felt to be in stable condition for discharge to home.   Consults: None  Significant Diagnostic Studies: labs: cbc, bmet  Treatments: IV hydration, analgesia: acetaminophen , and surgery: lap hiatal hernia repair  Discharge Exam: Blood pressure 135/77, pulse (!) 53, temperature 97.9 F (36.6 C), resp. rate 16, height 5' 9 (1.753 m), weight 125.6 kg, SpO2 91%. General appearance: alert and cooperative GI: soft Incision/Wound: clean, dry, intact  Disposition: home   Allergies as of 01/09/2025       Reactions   Benazepril Swelling   Other Nausea And Vomiting   Post anesthesia   Sulfa Antibiotics Itching, Nausea Only   Elemental Sulfur Itching, Nausea Only        Medication List     STOP taking these medications    dicyclomine 20 MG tablet Commonly known as: BENTYL   methocarbamol  750 MG tablet Commonly known as: Robaxin -750   ondansetron  4 MG tablet Commonly known as: ZOFRAN        TAKE these medications    acetaminophen  500 MG tablet Commonly known as: TYLENOL  Take 1,000 mg by mouth daily as needed for mild pain. pain   AMBULATORY NON FORMULARY MEDICATION Medication Name: Reflux Gourmet: Take 1 teaspoon (5ml) or more as needed after meals and before bed   Bariatric Fusion Chew Chew 1 tablet by mouth daily.   betamethasone valerate ointment 0.1 % Commonly known as: VALISONE Apply 1 Application  topically at bedtime.   diclofenac Sodium 1 % Gel Commonly known as: VOLTAREN Apply 2 g topically daily as needed (pain).   docusate sodium  100 MG capsule Commonly known as: Colace Take 1 capsule (100 mg total) by mouth 2 (two) times daily. Okay to decrease to once daily or stop taking if having loose bowel movements   famotidine  40 MG tablet Commonly known as: Pepcid  Take 1 tablet (40 mg total) by mouth at bedtime.   hydrALAZINE  25 MG tablet Commonly known as: APRESOLINE  Take 25 mg by mouth 4 (four) times daily.   lidocaine  2 % solution Commonly known as: XYLOCAINE  Use as directed 15 mLs in the mouth or throat as needed for mouth pain.   Maalox Max 400-400-40 MG/5ML suspension Generic drug: alum & mag hydroxide-simeth Take 5 mLs by mouth every 6 (six) hours as needed for indigestion.   metroNIDAZOLE  0.75 % vaginal gel Commonly known as: METROGEL  Place 1 Applicatorful vaginally at bedtime.   NON FORMULARY Pt uses a cpap nightly   ondansetron  4 MG disintegrating tablet Commonly known as: ZOFRAN -ODT Take 1 tablet (4 mg total) by mouth every 8 (eight) hours as needed.   Probiotic Caps Take 1 capsule by mouth daily.   spironolactone  50 MG tablet Commonly known as: ALDACTONE  Take 50 mg by mouth daily.   traMADol  50 MG tablet Commonly known as: Ultram  Take 1 tablet (50 mg total) by mouth every 6 (six) hours as needed for up to 5 days (pain not controlled by tylenol , ibuprofen , ice, rest etc).  Voquezna  10 MG Tabs Generic drug: Vonoprazan Fumarate  Take 10 mg by mouth daily.        Follow-up Information     Signe Mitzie LABOR, MD Follow up in 3 week(s).   Specialty: General Surgery Contact information: [address] [place] [address] [phone]                 Signed: Bernarda JAYSON Ned 01/09/2025, 9:00 AM
